# Patient Record
Sex: Female | Born: 1935
Health system: Southern US, Community
[De-identification: ages and names within clinical notes are randomized; demographics above are authoritative.]

## PROBLEM LIST (undated history)

## (undated) DIAGNOSIS — K222 Esophageal obstruction: Secondary | ICD-10-CM

## (undated) DIAGNOSIS — M542 Cervicalgia: Secondary | ICD-10-CM

## (undated) DIAGNOSIS — K219 Gastro-esophageal reflux disease without esophagitis: Secondary | ICD-10-CM

## (undated) DIAGNOSIS — T7840XA Allergy, unspecified, initial encounter: Secondary | ICD-10-CM

## (undated) DIAGNOSIS — E785 Hyperlipidemia, unspecified: Secondary | ICD-10-CM

## (undated) DIAGNOSIS — M25519 Pain in unspecified shoulder: Secondary | ICD-10-CM

## (undated) DIAGNOSIS — E079 Disorder of thyroid, unspecified: Secondary | ICD-10-CM

## (undated) DIAGNOSIS — K449 Diaphragmatic hernia without obstruction or gangrene: Secondary | ICD-10-CM

## (undated) DIAGNOSIS — D126 Benign neoplasm of colon, unspecified: Secondary | ICD-10-CM

## (undated) DIAGNOSIS — M1990 Unspecified osteoarthritis, unspecified site: Secondary | ICD-10-CM

## (undated) DIAGNOSIS — G8929 Other chronic pain: Secondary | ICD-10-CM

## (undated) DIAGNOSIS — H409 Unspecified glaucoma: Secondary | ICD-10-CM

## (undated) DIAGNOSIS — F419 Anxiety disorder, unspecified: Secondary | ICD-10-CM

## (undated) DIAGNOSIS — D62 Acute posthemorrhagic anemia: Secondary | ICD-10-CM

## (undated) DIAGNOSIS — M810 Age-related osteoporosis without current pathological fracture: Secondary | ICD-10-CM

## (undated) DIAGNOSIS — H269 Unspecified cataract: Secondary | ICD-10-CM

## (undated) DIAGNOSIS — Z9889 Other specified postprocedural states: Secondary | ICD-10-CM

## (undated) HISTORY — DX: Hyperlipidemia, unspecified: E78.5

## (undated) HISTORY — PX: TUBAL LIGATION: SHX77

## (undated) HISTORY — DX: Allergy, unspecified, initial encounter: T78.40XA

## (undated) HISTORY — DX: Benign neoplasm of colon, unspecified: D12.6

## (undated) HISTORY — DX: Unspecified cataract: H26.9

## (undated) HISTORY — DX: Disorder of thyroid, unspecified: E07.9

## (undated) HISTORY — PX: OTHER SURGICAL HISTORY: SHX169

## (undated) HISTORY — PX: FRACTURE SURGERY: SHX138

## (undated) HISTORY — DX: Diaphragmatic hernia without obstruction or gangrene: K44.9

## (undated) HISTORY — DX: Cervicalgia: M54.2

## (undated) HISTORY — DX: Other chronic pain: G89.29

## (undated) HISTORY — DX: Anxiety disorder, unspecified: F41.9

## (undated) HISTORY — DX: Other specified postprocedural states: Z98.890

## (undated) HISTORY — DX: Esophageal obstruction: K22.2

## (undated) HISTORY — DX: Unspecified osteoarthritis, unspecified site: M19.90

## (undated) HISTORY — DX: Unspecified glaucoma: H40.9

## (undated) HISTORY — DX: Pain in unspecified shoulder: M25.519

## (undated) HISTORY — DX: Age-related osteoporosis without current pathological fracture: M81.0

## (undated) HISTORY — DX: Gastro-esophageal reflux disease without esophagitis: K21.9

---
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
## 1968-10-22 DIAGNOSIS — M25519 Pain in unspecified shoulder: Secondary | ICD-10-CM

## 1968-10-22 HISTORY — DX: Pain in unspecified shoulder: M25.519

## 2001-04-29 ENCOUNTER — Emergency Department (HOSPITAL_COMMUNITY): Admission: EM | Admit: 2001-04-29 | Discharge: 2001-04-29 | Payer: Self-pay | Admitting: Emergency Medicine

## 2001-06-05 ENCOUNTER — Emergency Department (HOSPITAL_COMMUNITY): Admission: EM | Admit: 2001-06-05 | Discharge: 2001-06-05 | Payer: Self-pay | Admitting: Emergency Medicine

## 2001-07-09 ENCOUNTER — Ambulatory Visit (HOSPITAL_COMMUNITY): Admission: RE | Admit: 2001-07-09 | Discharge: 2001-07-09 | Payer: Self-pay | Admitting: General Surgery

## 2001-08-18 ENCOUNTER — Encounter: Payer: Self-pay | Admitting: Family Medicine

## 2001-08-18 ENCOUNTER — Ambulatory Visit (HOSPITAL_COMMUNITY): Admission: RE | Admit: 2001-08-18 | Discharge: 2001-08-18 | Payer: Self-pay | Admitting: Family Medicine

## 2001-08-20 ENCOUNTER — Other Ambulatory Visit: Admission: RE | Admit: 2001-08-20 | Discharge: 2001-08-20 | Payer: Self-pay | Admitting: Family Medicine

## 2001-08-22 ENCOUNTER — Ambulatory Visit (HOSPITAL_COMMUNITY): Admission: RE | Admit: 2001-08-22 | Discharge: 2001-08-22 | Payer: Self-pay | Admitting: Family Medicine

## 2001-08-22 ENCOUNTER — Encounter: Payer: Self-pay | Admitting: Family Medicine

## 2002-05-15 ENCOUNTER — Emergency Department (HOSPITAL_COMMUNITY): Admission: EM | Admit: 2002-05-15 | Discharge: 2002-05-15 | Payer: Self-pay | Admitting: Emergency Medicine

## 2002-05-15 ENCOUNTER — Encounter: Payer: Self-pay | Admitting: Emergency Medicine

## 2002-08-11 ENCOUNTER — Encounter: Payer: Self-pay | Admitting: Family Medicine

## 2002-08-11 ENCOUNTER — Ambulatory Visit (HOSPITAL_COMMUNITY): Admission: RE | Admit: 2002-08-11 | Discharge: 2002-08-11 | Payer: Self-pay | Admitting: Family Medicine

## 2002-08-21 ENCOUNTER — Ambulatory Visit (HOSPITAL_COMMUNITY): Admission: RE | Admit: 2002-08-21 | Discharge: 2002-08-21 | Payer: Self-pay | Admitting: Family Medicine

## 2002-08-21 ENCOUNTER — Encounter: Payer: Self-pay | Admitting: Family Medicine

## 2002-09-15 ENCOUNTER — Ambulatory Visit (HOSPITAL_COMMUNITY): Admission: RE | Admit: 2002-09-15 | Discharge: 2002-09-15 | Payer: Self-pay | Admitting: Ophthalmology

## 2002-10-22 HISTORY — PX: CATARACT EXTRACTION, BILATERAL: SHX1313

## 2002-12-15 ENCOUNTER — Ambulatory Visit (HOSPITAL_COMMUNITY): Admission: RE | Admit: 2002-12-15 | Discharge: 2002-12-15 | Payer: Self-pay | Admitting: Ophthalmology

## 2003-03-15 ENCOUNTER — Encounter: Payer: Self-pay | Admitting: Family Medicine

## 2003-03-15 ENCOUNTER — Ambulatory Visit (HOSPITAL_COMMUNITY): Admission: RE | Admit: 2003-03-15 | Discharge: 2003-03-15 | Payer: Self-pay | Admitting: Family Medicine

## 2003-06-07 ENCOUNTER — Other Ambulatory Visit: Admission: RE | Admit: 2003-06-07 | Discharge: 2003-06-07 | Payer: Self-pay | Admitting: Dermatology

## 2003-07-05 ENCOUNTER — Encounter: Payer: Self-pay | Admitting: Family Medicine

## 2003-07-05 ENCOUNTER — Ambulatory Visit (HOSPITAL_COMMUNITY): Admission: RE | Admit: 2003-07-05 | Discharge: 2003-07-05 | Payer: Self-pay | Admitting: Family Medicine

## 2003-08-16 ENCOUNTER — Ambulatory Visit (HOSPITAL_COMMUNITY): Admission: RE | Admit: 2003-08-16 | Discharge: 2003-08-16 | Payer: Self-pay | Admitting: Family Medicine

## 2003-08-16 ENCOUNTER — Encounter: Payer: Self-pay | Admitting: Family Medicine

## 2003-09-02 ENCOUNTER — Ambulatory Visit (HOSPITAL_COMMUNITY): Admission: RE | Admit: 2003-09-02 | Discharge: 2003-09-02 | Payer: Self-pay | Admitting: Family Medicine

## 2004-08-30 ENCOUNTER — Ambulatory Visit: Payer: Self-pay | Admitting: Family Medicine

## 2004-11-22 ENCOUNTER — Ambulatory Visit: Payer: Self-pay | Admitting: Family Medicine

## 2004-11-24 ENCOUNTER — Ambulatory Visit (HOSPITAL_COMMUNITY): Admission: RE | Admit: 2004-11-24 | Discharge: 2004-11-24 | Payer: Self-pay | Admitting: Family Medicine

## 2005-02-20 ENCOUNTER — Ambulatory Visit: Payer: Self-pay | Admitting: Family Medicine

## 2005-02-26 ENCOUNTER — Ambulatory Visit: Payer: Self-pay | Admitting: Family Medicine

## 2005-02-26 ENCOUNTER — Ambulatory Visit: Payer: Self-pay | Admitting: Orthopedic Surgery

## 2005-03-01 ENCOUNTER — Ambulatory Visit (HOSPITAL_COMMUNITY): Admission: RE | Admit: 2005-03-01 | Discharge: 2005-03-01 | Payer: Self-pay | Admitting: Family Medicine

## 2005-03-09 ENCOUNTER — Ambulatory Visit (HOSPITAL_COMMUNITY): Admission: RE | Admit: 2005-03-09 | Discharge: 2005-03-09 | Payer: Self-pay | Admitting: Family Medicine

## 2005-03-12 ENCOUNTER — Ambulatory Visit: Payer: Self-pay | Admitting: Orthopedic Surgery

## 2005-04-02 ENCOUNTER — Ambulatory Visit: Payer: Self-pay | Admitting: Orthopedic Surgery

## 2005-04-09 ENCOUNTER — Ambulatory Visit: Payer: Self-pay | Admitting: Family Medicine

## 2005-04-13 ENCOUNTER — Ambulatory Visit (HOSPITAL_COMMUNITY): Admission: RE | Admit: 2005-04-13 | Discharge: 2005-04-13 | Payer: Self-pay | Admitting: Family Medicine

## 2005-04-19 ENCOUNTER — Ambulatory Visit (HOSPITAL_COMMUNITY): Admission: RE | Admit: 2005-04-19 | Discharge: 2005-04-19 | Payer: Self-pay | Admitting: Family Medicine

## 2005-04-30 ENCOUNTER — Ambulatory Visit: Payer: Self-pay | Admitting: Family Medicine

## 2005-05-10 ENCOUNTER — Emergency Department (HOSPITAL_COMMUNITY): Admission: EM | Admit: 2005-05-10 | Discharge: 2005-05-10 | Payer: Self-pay | Admitting: Emergency Medicine

## 2005-05-14 ENCOUNTER — Ambulatory Visit (HOSPITAL_COMMUNITY): Admission: RE | Admit: 2005-05-14 | Discharge: 2005-05-14 | Payer: Self-pay | Admitting: Family Medicine

## 2005-05-18 ENCOUNTER — Emergency Department (HOSPITAL_COMMUNITY): Admission: EM | Admit: 2005-05-18 | Discharge: 2005-05-18 | Payer: Self-pay | Admitting: Emergency Medicine

## 2005-05-24 ENCOUNTER — Ambulatory Visit: Payer: Self-pay | Admitting: Family Medicine

## 2005-05-28 ENCOUNTER — Ambulatory Visit: Payer: Self-pay | Admitting: Family Medicine

## 2005-06-01 ENCOUNTER — Ambulatory Visit: Payer: Self-pay | Admitting: Family Medicine

## 2005-06-19 ENCOUNTER — Emergency Department (HOSPITAL_COMMUNITY): Admission: EM | Admit: 2005-06-19 | Discharge: 2005-06-19 | Payer: Self-pay | Admitting: Emergency Medicine

## 2005-06-20 ENCOUNTER — Ambulatory Visit: Payer: Self-pay | Admitting: Family Medicine

## 2005-06-27 ENCOUNTER — Ambulatory Visit: Payer: Self-pay | Admitting: Family Medicine

## 2005-07-09 ENCOUNTER — Ambulatory Visit (HOSPITAL_COMMUNITY): Admission: RE | Admit: 2005-07-09 | Discharge: 2005-07-09 | Payer: Self-pay | Admitting: Family Medicine

## 2005-07-30 ENCOUNTER — Ambulatory Visit: Payer: Self-pay | Admitting: Family Medicine

## 2005-08-06 ENCOUNTER — Ambulatory Visit: Payer: Self-pay | Admitting: Internal Medicine

## 2005-08-09 ENCOUNTER — Ambulatory Visit: Payer: Self-pay | Admitting: Family Medicine

## 2005-09-24 ENCOUNTER — Ambulatory Visit: Payer: Self-pay | Admitting: Family Medicine

## 2005-11-23 ENCOUNTER — Ambulatory Visit: Payer: Self-pay | Admitting: Family Medicine

## 2005-11-26 ENCOUNTER — Ambulatory Visit: Payer: Self-pay | Admitting: Family Medicine

## 2005-11-26 ENCOUNTER — Ambulatory Visit (HOSPITAL_COMMUNITY): Admission: RE | Admit: 2005-11-26 | Discharge: 2005-11-26 | Payer: Self-pay | Admitting: Family Medicine

## 2005-12-24 ENCOUNTER — Ambulatory Visit: Payer: Self-pay | Admitting: Family Medicine

## 2006-01-07 ENCOUNTER — Ambulatory Visit: Payer: Self-pay | Admitting: Orthopedic Surgery

## 2006-02-21 ENCOUNTER — Ambulatory Visit: Payer: Self-pay | Admitting: Orthopedic Surgery

## 2006-02-22 ENCOUNTER — Ambulatory Visit: Payer: Self-pay | Admitting: Internal Medicine

## 2006-02-22 ENCOUNTER — Ambulatory Visit: Payer: Self-pay | Admitting: Family Medicine

## 2006-02-25 ENCOUNTER — Ambulatory Visit: Payer: Self-pay | Admitting: Internal Medicine

## 2006-03-01 ENCOUNTER — Ambulatory Visit: Payer: Self-pay | Admitting: Internal Medicine

## 2006-03-01 ENCOUNTER — Ambulatory Visit (HOSPITAL_COMMUNITY): Admission: RE | Admit: 2006-03-01 | Discharge: 2006-03-01 | Payer: Self-pay | Admitting: Internal Medicine

## 2006-03-01 DIAGNOSIS — Z9889 Other specified postprocedural states: Secondary | ICD-10-CM

## 2006-03-01 DIAGNOSIS — K219 Gastro-esophageal reflux disease without esophagitis: Secondary | ICD-10-CM

## 2006-03-01 HISTORY — PX: ESOPHAGOGASTRODUODENOSCOPY: SHX1529

## 2006-03-01 HISTORY — DX: Other specified postprocedural states: Z98.890

## 2006-03-01 HISTORY — PX: COLONOSCOPY: SHX174

## 2006-03-01 HISTORY — DX: Gastro-esophageal reflux disease without esophagitis: K21.9

## 2006-04-01 ENCOUNTER — Ambulatory Visit: Payer: Self-pay | Admitting: Internal Medicine

## 2006-04-09 ENCOUNTER — Ambulatory Visit: Payer: Self-pay | Admitting: Internal Medicine

## 2006-04-10 ENCOUNTER — Ambulatory Visit: Payer: Self-pay | Admitting: Orthopedic Surgery

## 2006-04-19 ENCOUNTER — Ambulatory Visit: Payer: Self-pay | Admitting: Family Medicine

## 2006-05-17 ENCOUNTER — Ambulatory Visit (HOSPITAL_COMMUNITY): Admission: RE | Admit: 2006-05-17 | Discharge: 2006-05-17 | Payer: Self-pay | Admitting: Family Medicine

## 2006-06-25 ENCOUNTER — Ambulatory Visit: Payer: Self-pay | Admitting: Internal Medicine

## 2006-07-03 ENCOUNTER — Ambulatory Visit (HOSPITAL_COMMUNITY): Admission: RE | Admit: 2006-07-03 | Discharge: 2006-07-03 | Payer: Self-pay | Admitting: Internal Medicine

## 2006-07-11 ENCOUNTER — Ambulatory Visit: Payer: Self-pay | Admitting: Orthopedic Surgery

## 2006-07-19 ENCOUNTER — Ambulatory Visit: Payer: Self-pay | Admitting: Family Medicine

## 2006-07-29 ENCOUNTER — Ambulatory Visit: Payer: Self-pay | Admitting: Internal Medicine

## 2006-08-15 ENCOUNTER — Ambulatory Visit: Payer: Self-pay | Admitting: Orthopedic Surgery

## 2006-09-25 ENCOUNTER — Ambulatory Visit: Payer: Self-pay | Admitting: Family Medicine

## 2006-09-26 ENCOUNTER — Ambulatory Visit: Payer: Self-pay | Admitting: Orthopedic Surgery

## 2006-10-07 ENCOUNTER — Ambulatory Visit: Payer: Self-pay | Admitting: Internal Medicine

## 2006-10-22 HISTORY — PX: NASAL SINUS SURGERY: SHX719

## 2006-10-24 ENCOUNTER — Ambulatory Visit: Payer: Self-pay | Admitting: Family Medicine

## 2006-10-25 ENCOUNTER — Encounter: Payer: Self-pay | Admitting: Family Medicine

## 2006-12-25 ENCOUNTER — Other Ambulatory Visit: Admission: RE | Admit: 2006-12-25 | Discharge: 2006-12-25 | Payer: Self-pay | Admitting: Family Medicine

## 2006-12-25 ENCOUNTER — Encounter: Payer: Self-pay | Admitting: Family Medicine

## 2006-12-25 ENCOUNTER — Ambulatory Visit: Payer: Self-pay | Admitting: Family Medicine

## 2006-12-25 LAB — CONVERTED CEMR LAB
BUN: 11 mg/dL (ref 6–23)
Cholesterol: 186 mg/dL (ref 0–200)
Eosinophils Absolute: 0.1 10*3/uL (ref 0.0–0.7)
Glucose, Bld: 92 mg/dL (ref 70–99)
HCT: 37.7 % (ref 36.0–46.0)
Hemoglobin: 11.6 g/dL — ABNORMAL LOW (ref 12.0–15.0)
LDL Cholesterol: 105 mg/dL — ABNORMAL HIGH (ref 0–99)
Lymphs Abs: 2.2 10*3/uL (ref 0.7–3.3)
MCV: 95.7 fL (ref 78.0–100.0)
Monocytes Absolute: 0.5 10*3/uL (ref 0.2–0.7)
Monocytes Relative: 9 % (ref 3–11)
Neutrophils Relative %: 48 % (ref 43–77)
Potassium: 4.9 meq/L (ref 3.5–5.3)
RBC: 3.94 M/uL (ref 3.87–5.11)
VLDL: 28 mg/dL (ref 0–40)
WBC: 5.4 10*3/uL (ref 4.0–10.5)

## 2006-12-26 ENCOUNTER — Encounter: Payer: Self-pay | Admitting: Family Medicine

## 2006-12-26 LAB — CONVERTED CEMR LAB
AST: 22 units/L (ref 0–37)
Bilirubin, Direct: 0.1 mg/dL (ref 0.0–0.3)
Total Bilirubin: 0.4 mg/dL (ref 0.3–1.2)

## 2006-12-30 ENCOUNTER — Ambulatory Visit (HOSPITAL_COMMUNITY): Admission: RE | Admit: 2006-12-30 | Discharge: 2006-12-30 | Payer: Self-pay | Admitting: Family Medicine

## 2007-01-20 ENCOUNTER — Ambulatory Visit (HOSPITAL_COMMUNITY): Admission: RE | Admit: 2007-01-20 | Discharge: 2007-01-20 | Payer: Self-pay | Admitting: Family Medicine

## 2007-01-20 ENCOUNTER — Ambulatory Visit: Payer: Self-pay | Admitting: Family Medicine

## 2007-02-03 ENCOUNTER — Ambulatory Visit: Payer: Self-pay | Admitting: Orthopedic Surgery

## 2007-02-12 ENCOUNTER — Emergency Department (HOSPITAL_COMMUNITY): Admission: EM | Admit: 2007-02-12 | Discharge: 2007-02-12 | Payer: Self-pay | Admitting: Emergency Medicine

## 2007-02-19 ENCOUNTER — Ambulatory Visit: Payer: Self-pay | Admitting: Family Medicine

## 2007-02-24 ENCOUNTER — Encounter: Payer: Self-pay | Admitting: Family Medicine

## 2007-02-28 ENCOUNTER — Ambulatory Visit (HOSPITAL_COMMUNITY): Admission: RE | Admit: 2007-02-28 | Discharge: 2007-02-28 | Payer: Self-pay | Admitting: Family Medicine

## 2007-03-28 ENCOUNTER — Ambulatory Visit: Payer: Self-pay | Admitting: Family Medicine

## 2007-03-28 LAB — CONVERTED CEMR LAB
ALT: 12 units/L (ref 0–35)
Albumin: 4.5 g/dL (ref 3.5–5.2)
Cholesterol: 196 mg/dL (ref 0–200)
Total CHOL/HDL Ratio: 3.4
Total Protein: 7.9 g/dL (ref 6.0–8.3)
Triglycerides: 165 mg/dL — ABNORMAL HIGH (ref <150)
VLDL: 33 mg/dL (ref 0–40)

## 2007-04-26 ENCOUNTER — Emergency Department (HOSPITAL_COMMUNITY): Admission: EM | Admit: 2007-04-26 | Discharge: 2007-04-26 | Payer: Self-pay | Admitting: Surgery

## 2007-05-01 ENCOUNTER — Ambulatory Visit: Payer: Self-pay | Admitting: Internal Medicine

## 2007-05-22 ENCOUNTER — Ambulatory Visit (HOSPITAL_COMMUNITY): Admission: RE | Admit: 2007-05-22 | Discharge: 2007-05-22 | Payer: Self-pay | Admitting: Family Medicine

## 2007-06-04 ENCOUNTER — Ambulatory Visit (HOSPITAL_COMMUNITY): Admission: RE | Admit: 2007-06-04 | Discharge: 2007-06-04 | Payer: Self-pay | Admitting: Family Medicine

## 2007-06-12 ENCOUNTER — Ambulatory Visit: Payer: Self-pay | Admitting: Family Medicine

## 2007-06-17 ENCOUNTER — Ambulatory Visit (HOSPITAL_COMMUNITY): Admission: RE | Admit: 2007-06-17 | Discharge: 2007-06-17 | Payer: Self-pay | Admitting: Ophthalmology

## 2007-07-08 ENCOUNTER — Ambulatory Visit: Payer: Self-pay | Admitting: Family Medicine

## 2007-07-10 ENCOUNTER — Ambulatory Visit (HOSPITAL_COMMUNITY): Admission: RE | Admit: 2007-07-10 | Discharge: 2007-07-10 | Payer: Self-pay | Admitting: Family Medicine

## 2007-07-24 ENCOUNTER — Ambulatory Visit: Payer: Self-pay | Admitting: Family Medicine

## 2007-08-22 ENCOUNTER — Encounter: Payer: Self-pay | Admitting: Family Medicine

## 2007-08-22 LAB — CONVERTED CEMR LAB
ALT: 13 units/L (ref 0–35)
Alkaline Phosphatase: 58 units/L (ref 39–117)
Cholesterol: 158 mg/dL (ref 0–200)
Indirect Bilirubin: 0.2 mg/dL (ref 0.0–0.9)
Total Protein: 7.2 g/dL (ref 6.0–8.3)
Triglycerides: 178 mg/dL — ABNORMAL HIGH (ref <150)

## 2007-09-08 ENCOUNTER — Ambulatory Visit: Payer: Self-pay | Admitting: Family Medicine

## 2007-09-29 ENCOUNTER — Ambulatory Visit (HOSPITAL_COMMUNITY): Admission: RE | Admit: 2007-09-29 | Discharge: 2007-09-29 | Payer: Self-pay | Admitting: Family Medicine

## 2007-10-07 ENCOUNTER — Encounter: Payer: Self-pay | Admitting: Orthopedic Surgery

## 2007-10-09 ENCOUNTER — Encounter: Payer: Self-pay | Admitting: Family Medicine

## 2008-01-06 ENCOUNTER — Ambulatory Visit: Payer: Self-pay | Admitting: Family Medicine

## 2008-01-15 DIAGNOSIS — E785 Hyperlipidemia, unspecified: Secondary | ICD-10-CM

## 2008-01-15 DIAGNOSIS — M8000XA Age-related osteoporosis with current pathological fracture, unspecified site, initial encounter for fracture: Secondary | ICD-10-CM

## 2008-01-22 ENCOUNTER — Encounter: Payer: Self-pay | Admitting: Family Medicine

## 2008-01-22 LAB — CONVERTED CEMR LAB
ALT: 10 units/L (ref 0–35)
AST: 17 units/L (ref 0–37)
Alkaline Phosphatase: 62 units/L (ref 39–117)
Basophils Absolute: 0.1 10*3/uL (ref 0.0–0.1)
Bilirubin, Direct: 0.1 mg/dL (ref 0.0–0.3)
Calcium: 9.5 mg/dL (ref 8.4–10.5)
Cholesterol: 182 mg/dL (ref 0–200)
Creatinine, Ser: 0.64 mg/dL (ref 0.40–1.20)
Eosinophils Absolute: 0.2 10*3/uL (ref 0.0–0.7)
Eosinophils Relative: 3 % (ref 0–5)
Glucose, Bld: 102 mg/dL — ABNORMAL HIGH (ref 70–99)
HCT: 35.1 % — ABNORMAL LOW (ref 36.0–46.0)
Indirect Bilirubin: 0.3 mg/dL (ref 0.0–0.9)
Lymphs Abs: 2.5 10*3/uL (ref 0.7–4.0)
MCV: 93.9 fL (ref 78.0–100.0)
Monocytes Absolute: 0.6 10*3/uL (ref 0.1–1.0)
Platelets: 357 10*3/uL (ref 150–400)
RDW: 13.9 % (ref 11.5–15.5)
Sodium: 144 meq/L (ref 135–145)
Total CHOL/HDL Ratio: 3.5

## 2008-01-23 ENCOUNTER — Encounter: Payer: Self-pay | Admitting: Family Medicine

## 2008-01-23 LAB — CONVERTED CEMR LAB: Retic Ct Pct: 1.1 % (ref 0.4–3.1)

## 2008-02-23 ENCOUNTER — Encounter: Payer: Self-pay | Admitting: Family Medicine

## 2008-06-07 ENCOUNTER — Ambulatory Visit: Payer: Self-pay | Admitting: Family Medicine

## 2008-06-14 ENCOUNTER — Encounter: Payer: Self-pay | Admitting: Family Medicine

## 2008-06-15 LAB — CONVERTED CEMR LAB
Albumin: 4.3 g/dL (ref 3.5–5.2)
HDL: 49 mg/dL (ref >39)
LDL Cholesterol: 78 mg/dL (ref 0–99)
Total Bilirubin: 0.4 mg/dL (ref 0.3–1.2)
Total CHOL/HDL Ratio: 3.4
Total Protein: 7.5 g/dL (ref 6.0–8.3)
VLDL: 41 mg/dL — ABNORMAL HIGH (ref 0–40)

## 2008-07-02 ENCOUNTER — Telehealth: Payer: Self-pay | Admitting: Family Medicine

## 2008-07-07 ENCOUNTER — Ambulatory Visit: Payer: Self-pay | Admitting: Family Medicine

## 2008-07-07 LAB — CONVERTED CEMR LAB: Glucose, Bld: 91 mg/dL

## 2008-07-19 ENCOUNTER — Encounter: Payer: Self-pay | Admitting: Family Medicine

## 2008-07-20 ENCOUNTER — Ambulatory Visit: Payer: Self-pay | Admitting: Family Medicine

## 2008-07-25 DIAGNOSIS — M26609 Unspecified temporomandibular joint disorder, unspecified side: Secondary | ICD-10-CM | POA: Insufficient documentation

## 2008-08-05 ENCOUNTER — Telehealth: Payer: Self-pay | Admitting: Family Medicine

## 2008-08-06 ENCOUNTER — Encounter: Payer: Self-pay | Admitting: Family Medicine

## 2008-08-10 ENCOUNTER — Telehealth (INDEPENDENT_AMBULATORY_CARE_PROVIDER_SITE_OTHER): Payer: Self-pay | Admitting: Internal Medicine

## 2008-08-11 ENCOUNTER — Telehealth (INDEPENDENT_AMBULATORY_CARE_PROVIDER_SITE_OTHER): Payer: Self-pay | Admitting: *Deleted

## 2008-08-11 ENCOUNTER — Ambulatory Visit: Payer: Self-pay | Admitting: Family Medicine

## 2008-08-11 DIAGNOSIS — N3000 Acute cystitis without hematuria: Secondary | ICD-10-CM

## 2008-08-11 DIAGNOSIS — R509 Fever, unspecified: Secondary | ICD-10-CM

## 2008-08-11 DIAGNOSIS — N814 Uterovaginal prolapse, unspecified: Secondary | ICD-10-CM | POA: Insufficient documentation

## 2008-08-11 LAB — CONVERTED CEMR LAB
Bilirubin Urine: NEGATIVE
Glucose, Urine, Semiquant: NEGATIVE
Ketones, urine, test strip: NEGATIVE
Specific Gravity, Urine: 1.02
pH: 5.5

## 2008-08-13 LAB — CONVERTED CEMR LAB
Basophils Relative: 1 % (ref 0–1)
Eosinophils Absolute: 0.1 10*3/uL (ref 0.0–0.7)
Lymphs Abs: 2.3 10*3/uL (ref 0.7–4.0)
Monocytes Relative: 9 % (ref 3–12)
Neutro Abs: 4.3 10*3/uL (ref 1.7–7.7)
Neutrophils Relative %: 58 % (ref 43–77)
Platelets: 376 10*3/uL (ref 150–400)
RBC: 4.02 M/uL (ref 3.87–5.11)
WBC: 7.3 10*3/uL (ref 4.0–10.5)

## 2008-08-17 ENCOUNTER — Encounter: Admission: RE | Admit: 2008-08-17 | Discharge: 2008-08-17 | Payer: Self-pay | Admitting: Family Medicine

## 2008-09-07 ENCOUNTER — Ambulatory Visit: Payer: Self-pay | Admitting: Family Medicine

## 2008-09-07 DIAGNOSIS — J0100 Acute maxillary sinusitis, unspecified: Secondary | ICD-10-CM | POA: Insufficient documentation

## 2008-09-07 DIAGNOSIS — R519 Headache, unspecified: Secondary | ICD-10-CM | POA: Insufficient documentation

## 2008-09-07 DIAGNOSIS — R51 Headache: Secondary | ICD-10-CM | POA: Insufficient documentation

## 2008-09-08 LAB — CONVERTED CEMR LAB
ALT: 12 units/L (ref 0–35)
Albumin: 4.4 g/dL (ref 3.5–5.2)
BUN: 7 mg/dL (ref 6–23)
Chloride: 104 meq/L (ref 96–112)
Cholesterol: 179 mg/dL (ref 0–200)
Glucose, Bld: 97 mg/dL (ref 70–99)
Indirect Bilirubin: 0.3 mg/dL (ref 0.0–0.9)
LDL Cholesterol: 92 mg/dL (ref 0–99)
Potassium: 4.5 meq/L (ref 3.5–5.3)
Sodium: 142 meq/L (ref 135–145)
Total CHOL/HDL Ratio: 3.3
Total Protein: 7.2 g/dL (ref 6.0–8.3)
Triglycerides: 165 mg/dL — ABNORMAL HIGH (ref <150)
VLDL: 33 mg/dL (ref 0–40)

## 2008-09-09 ENCOUNTER — Telehealth: Payer: Self-pay | Admitting: Family Medicine

## 2008-09-14 DIAGNOSIS — J209 Acute bronchitis, unspecified: Secondary | ICD-10-CM

## 2008-10-18 ENCOUNTER — Encounter: Payer: Self-pay | Admitting: Family Medicine

## 2008-11-10 ENCOUNTER — Ambulatory Visit: Payer: Self-pay | Admitting: Family Medicine

## 2008-12-16 ENCOUNTER — Encounter: Payer: Self-pay | Admitting: Family Medicine

## 2008-12-16 LAB — CONVERTED CEMR LAB
ALT: 12 U/L
AST: 20 U/L
Albumin: 4.4 g/dL
Alkaline Phosphatase: 59 U/L
BUN: 9 mg/dL
Bilirubin, Direct: 0.1 mg/dL
CO2: 25 meq/L
Calcium: 9.6 mg/dL
Chloride: 105 meq/L
Cholesterol: 184 mg/dL
Creatinine, Ser: 0.62 mg/dL
Glucose, Bld: 107 mg/dL — ABNORMAL HIGH
HDL: 58 mg/dL
Indirect Bilirubin: 0.3 mg/dL
LDL Cholesterol: 100 mg/dL — ABNORMAL HIGH
Potassium: 4.3 meq/L
Sodium: 143 meq/L
Total Bilirubin: 0.4 mg/dL
Total CHOL/HDL Ratio: 3.2
Total Protein: 7.4 g/dL
Triglycerides: 132 mg/dL
VLDL: 26 mg/dL
Vit D, 25-Hydroxy: 37 ng/mL

## 2008-12-24 ENCOUNTER — Telehealth: Payer: Self-pay | Admitting: Family Medicine

## 2009-01-04 ENCOUNTER — Telehealth: Payer: Self-pay | Admitting: Family Medicine

## 2009-01-20 ENCOUNTER — Encounter: Payer: Self-pay | Admitting: Family Medicine

## 2009-01-24 ENCOUNTER — Telehealth: Payer: Self-pay | Admitting: Family Medicine

## 2009-03-02 ENCOUNTER — Encounter: Payer: Self-pay | Admitting: Family Medicine

## 2009-03-02 ENCOUNTER — Ambulatory Visit: Payer: Self-pay | Admitting: Family Medicine

## 2009-03-02 ENCOUNTER — Other Ambulatory Visit: Admission: RE | Admit: 2009-03-02 | Discharge: 2009-03-02 | Payer: Self-pay | Admitting: Family Medicine

## 2009-03-02 DIAGNOSIS — R5381 Other malaise: Secondary | ICD-10-CM

## 2009-03-02 DIAGNOSIS — R5383 Other fatigue: Secondary | ICD-10-CM

## 2009-03-02 DIAGNOSIS — M542 Cervicalgia: Secondary | ICD-10-CM

## 2009-03-02 HISTORY — DX: Cervicalgia: M54.2

## 2009-03-02 LAB — CONVERTED CEMR LAB: OCCULT 1: NEGATIVE

## 2009-05-23 ENCOUNTER — Encounter: Payer: Self-pay | Admitting: Family Medicine

## 2009-06-02 ENCOUNTER — Ambulatory Visit: Payer: Self-pay | Admitting: Family Medicine

## 2009-07-12 LAB — CONVERTED CEMR LAB
AST: 21 units/L (ref 0–37)
Bilirubin, Direct: 0.1 mg/dL (ref 0.0–0.3)
Cholesterol: 168 mg/dL (ref 0–200)
Indirect Bilirubin: 0.3 mg/dL (ref 0.0–0.9)
Total Bilirubin: 0.4 mg/dL (ref 0.3–1.2)
Total CHOL/HDL Ratio: 3.7

## 2009-08-09 ENCOUNTER — Ambulatory Visit: Payer: Self-pay | Admitting: Family Medicine

## 2009-08-18 ENCOUNTER — Encounter: Payer: Self-pay | Admitting: Family Medicine

## 2009-08-22 ENCOUNTER — Telehealth: Payer: Self-pay | Admitting: Family Medicine

## 2009-08-22 ENCOUNTER — Ambulatory Visit: Payer: Self-pay | Admitting: Family Medicine

## 2009-08-22 ENCOUNTER — Ambulatory Visit (HOSPITAL_COMMUNITY): Admission: RE | Admit: 2009-08-22 | Discharge: 2009-08-22 | Payer: Self-pay | Admitting: Family Medicine

## 2009-08-22 LAB — CONVERTED CEMR LAB
Basophils Relative: 1 % (ref 0–1)
Eosinophils Absolute: 0.1 10*3/uL (ref 0.0–0.7)
Eosinophils Relative: 1 % (ref 0–5)
HCT: 38.1 % (ref 36.0–46.0)
Lymphs Abs: 1.9 10*3/uL (ref 0.7–4.0)
MCHC: 31.2 g/dL (ref 30.0–36.0)
MCV: 95.7 fL (ref 78.0–100.0)
Monocytes Relative: 13 % — ABNORMAL HIGH (ref 3–12)
Platelets: 331 10*3/uL (ref 150–400)
WBC: 5.7 10*3/uL (ref 4.0–10.5)

## 2009-08-28 DIAGNOSIS — J069 Acute upper respiratory infection, unspecified: Secondary | ICD-10-CM | POA: Insufficient documentation

## 2009-08-30 ENCOUNTER — Telehealth: Payer: Self-pay | Admitting: Family Medicine

## 2009-10-10 ENCOUNTER — Telehealth: Payer: Self-pay | Admitting: Family Medicine

## 2009-11-24 ENCOUNTER — Ambulatory Visit: Payer: Self-pay | Admitting: Family Medicine

## 2009-11-25 LAB — CONVERTED CEMR LAB
Albumin: 4.1 g/dL (ref 3.5–5.2)
HDL: 63 mg/dL (ref >39)
LDL Cholesterol: 116 mg/dL — ABNORMAL HIGH (ref 0–99)
TSH: 2.393 microintl units/mL (ref 0.350–4.500)
Total Bilirubin: 0.4 mg/dL (ref 0.3–1.2)
Total CHOL/HDL Ratio: 3.3

## 2010-01-11 ENCOUNTER — Ambulatory Visit: Payer: Self-pay | Admitting: Family Medicine

## 2010-01-11 DIAGNOSIS — R079 Chest pain, unspecified: Secondary | ICD-10-CM | POA: Insufficient documentation

## 2010-01-19 ENCOUNTER — Emergency Department (HOSPITAL_COMMUNITY): Admission: EM | Admit: 2010-01-19 | Discharge: 2010-01-20 | Payer: Self-pay | Admitting: Emergency Medicine

## 2010-01-25 ENCOUNTER — Ambulatory Visit: Payer: Self-pay | Admitting: Family Medicine

## 2010-01-25 DIAGNOSIS — J302 Other seasonal allergic rhinitis: Secondary | ICD-10-CM | POA: Insufficient documentation

## 2010-01-25 DIAGNOSIS — B9789 Other viral agents as the cause of diseases classified elsewhere: Secondary | ICD-10-CM | POA: Insufficient documentation

## 2010-02-11 ENCOUNTER — Emergency Department (HOSPITAL_COMMUNITY): Admission: EM | Admit: 2010-02-11 | Discharge: 2010-02-11 | Payer: Self-pay | Admitting: Emergency Medicine

## 2010-02-14 ENCOUNTER — Ambulatory Visit: Payer: Self-pay | Admitting: Family Medicine

## 2010-02-14 DIAGNOSIS — R0989 Other specified symptoms and signs involving the circulatory and respiratory systems: Secondary | ICD-10-CM

## 2010-02-23 ENCOUNTER — Ambulatory Visit (HOSPITAL_COMMUNITY): Admission: RE | Admit: 2010-02-23 | Discharge: 2010-02-23 | Payer: Self-pay | Admitting: Family Medicine

## 2010-03-30 LAB — CONVERTED CEMR LAB
AST: 20 units/L (ref 0–37)
Albumin: 4.5 g/dL (ref 3.5–5.2)
Basophils Relative: 1 % (ref 0–1)
CO2: 26 meq/L (ref 19–32)
Calcium: 9.8 mg/dL (ref 8.4–10.5)
Chloride: 104 meq/L (ref 96–112)
HDL: 61 mg/dL (ref >39)
Lymphocytes Relative: 43 % (ref 12–46)
Lymphs Abs: 2.2 10*3/uL (ref 0.7–4.0)
Monocytes Relative: 9 % (ref 3–12)
Neutro Abs: 2.3 10*3/uL (ref 1.7–7.7)
Neutrophils Relative %: 45 % (ref 43–77)
RBC: 3.81 M/uL — ABNORMAL LOW (ref 3.87–5.11)
Sodium: 141 meq/L (ref 135–145)
TSH: 2.667 microintl units/mL (ref 0.350–4.500)
Total Bilirubin: 0.5 mg/dL (ref 0.3–1.2)
Total CHOL/HDL Ratio: 2.9
WBC: 5.2 10*3/uL (ref 4.0–10.5)

## 2010-04-02 ENCOUNTER — Emergency Department (HOSPITAL_COMMUNITY): Admission: EM | Admit: 2010-04-02 | Discharge: 2010-04-02 | Payer: Self-pay | Admitting: Emergency Medicine

## 2010-04-04 ENCOUNTER — Ambulatory Visit: Payer: Self-pay | Admitting: Family Medicine

## 2010-04-04 DIAGNOSIS — L509 Urticaria, unspecified: Secondary | ICD-10-CM

## 2010-04-12 ENCOUNTER — Encounter: Payer: Self-pay | Admitting: Family Medicine

## 2010-04-25 ENCOUNTER — Telehealth (INDEPENDENT_AMBULATORY_CARE_PROVIDER_SITE_OTHER): Payer: Self-pay | Admitting: *Deleted

## 2010-04-25 ENCOUNTER — Ambulatory Visit: Payer: Self-pay | Admitting: Family Medicine

## 2010-04-25 DIAGNOSIS — L299 Pruritus, unspecified: Secondary | ICD-10-CM | POA: Insufficient documentation

## 2010-04-25 DIAGNOSIS — M79609 Pain in unspecified limb: Secondary | ICD-10-CM | POA: Insufficient documentation

## 2010-04-25 LAB — CONVERTED CEMR LAB
Glucose, Urine, Semiquant: NEGATIVE
Nitrite: NEGATIVE
Urobilinogen, UA: 0.2
pH: 5

## 2010-04-27 ENCOUNTER — Telehealth: Payer: Self-pay | Admitting: Family Medicine

## 2010-04-27 ENCOUNTER — Encounter: Payer: Self-pay | Admitting: Family Medicine

## 2010-04-27 LAB — CONVERTED CEMR LAB
MCHC: 31.9 g/dL (ref 30.0–36.0)
Platelets: 314 10*3/uL (ref 150–400)
Potassium: 4.7 meq/L (ref 3.5–5.3)
RBC: 4 M/uL (ref 3.87–5.11)
RDW: 13.8 % (ref 11.5–15.5)
Sodium: 145 meq/L (ref 135–145)

## 2010-05-18 ENCOUNTER — Ambulatory Visit: Payer: Self-pay | Admitting: Family Medicine

## 2010-05-18 LAB — CONVERTED CEMR LAB: OCCULT 1: NEGATIVE

## 2010-05-22 DIAGNOSIS — R634 Abnormal weight loss: Secondary | ICD-10-CM

## 2010-07-13 ENCOUNTER — Telehealth: Payer: Self-pay | Admitting: Family Medicine

## 2010-07-26 ENCOUNTER — Ambulatory Visit: Payer: Self-pay | Admitting: Family Medicine

## 2010-08-24 LAB — CONVERTED CEMR LAB
Albumin: 4.3 g/dL (ref 3.5–5.2)
CO2: 28 meq/L (ref 19–32)
Chloride: 105 meq/L (ref 96–112)
HDL: 61 mg/dL (ref >39)
LDL Cholesterol: 88 mg/dL (ref 0–99)
Potassium: 4.3 meq/L (ref 3.5–5.3)
Sodium: 143 meq/L (ref 135–145)
Total Bilirubin: 0.5 mg/dL (ref 0.3–1.2)
Total CHOL/HDL Ratio: 2.9
Total Protein: 7.1 g/dL (ref 6.0–8.3)

## 2010-08-30 ENCOUNTER — Ambulatory Visit: Payer: Self-pay | Admitting: Family Medicine

## 2010-08-30 DIAGNOSIS — F411 Generalized anxiety disorder: Secondary | ICD-10-CM | POA: Insufficient documentation

## 2010-08-30 DIAGNOSIS — K319 Disease of stomach and duodenum, unspecified: Secondary | ICD-10-CM | POA: Insufficient documentation

## 2010-08-30 DIAGNOSIS — R1013 Epigastric pain: Secondary | ICD-10-CM

## 2010-08-31 ENCOUNTER — Encounter: Payer: Self-pay | Admitting: Family Medicine

## 2010-09-04 ENCOUNTER — Encounter: Payer: Self-pay | Admitting: Family Medicine

## 2010-09-07 ENCOUNTER — Ambulatory Visit (HOSPITAL_COMMUNITY)
Admission: RE | Admit: 2010-09-07 | Discharge: 2010-09-07 | Payer: Self-pay | Source: Home / Self Care | Admitting: Family Medicine

## 2010-09-20 ENCOUNTER — Encounter: Payer: Self-pay | Admitting: Family Medicine

## 2010-10-11 ENCOUNTER — Telehealth: Payer: Self-pay | Admitting: Family Medicine

## 2010-11-11 ENCOUNTER — Encounter: Payer: Self-pay | Admitting: Family Medicine

## 2010-11-12 ENCOUNTER — Encounter: Payer: Self-pay | Admitting: Family Medicine

## 2010-11-13 ENCOUNTER — Encounter: Payer: Self-pay | Admitting: Family Medicine

## 2010-11-21 NOTE — Letter (Signed)
Summary: eye exam  eye exam   Imported By: Lind Guest 09/04/2010 16:49:26  _____________________________________________________________________  External Attachment:    Type:   Image     Comment:   External Document

## 2010-11-21 NOTE — Assessment & Plan Note (Signed)
Summary: er follow up- room 1   Vital Signs:  Patient profile:   77 year old female Menstrual status:  postmenopausal Height:      63 inches Weight:      128 pounds BMI:     22.76 O2 Sat:      98 % on Room air Pulse rate:   65 / minute Resp:     16 per minute BP sitting:   140 / 70  (left arm)  Vitals Entered By: Adella Hare LPN (January 25, 6961 11:02 AM) CC: er follow up Is Patient Diabetic? No Pain Assessment Patient in pain? no        CC:  er follow up.  History of Present Illness: Pt states she has had dry eyes, & nasal congestion last couple of days.  She has been taking either benadryl or an otc allery med with decong as needed.  Doesn't like that she has to take these frequently throughout the day & wonders if I could prescribe something that is once a day. Claritin makes her itch allover.  Zyrtec has worked well in past.  Would like a prescription.  Went to ER a few days ago.  Felt weak & achey.  Appetite was nl.  "Just didn't feel good."  This has resolved now.  Was dx as a viral infection.    Current Medications (verified): 1)  Calcium Plus Vitamin D 600-100 Mg-Unit  Caps (Calcium Carbonate-Vitamin D) .... One Cap By Mouth Two Times A Day 2)  Multivitamins   Caps (Multiple Vitamin) .... Take 1 Tablet By Mouth Once A Day 3)  Benadryl 25 Mg  Caps (Diphenhydramine Hcl) .... Take 1 Tab By Mouth At Bedtime 4)  Aspirin 81 Mg  Tbec (Aspirin) .... Take 1 Tablet By Mouth Once A Day 5)  Omeprazole 20 Mg Cpdr (Omeprazole) .... One Cap By Mouth Qd 6)  Clonazepam 0.5 Mg Tabs (Clonazepam) .... One Half Tab By Mouth Two Times A Day Prn 7)  Lipitor 10 Mg Tabs (Atorvastatin Calcium) .... One Tab By Mouth Qhs 8)  Amoxicillin 500 Mg Tabs (Amoxicillin) .... Take 1 Tablet By Mouth Three Times A Day  Allergies (verified): 1)  ! * Nasonex  Past History:  Past medical history reviewed for relevance to current acute and chronic problems.  Past Medical History: Reviewed history  from 06/07/2008 and no changes required. OSTEOPOROSIS (ICD-733.00) GERD (ICD-530.81) HYPERLIPIDEMIA (ICD-272.4) ALLERGIES  Review of Systems General:  Denies chills and fever. ENT:  Complains of nasal congestion; denies earache, sinus pressure, and sore throat. CV:  Denies chest pain or discomfort. Resp:  Complains of cough; denies shortness of breath. GI:  Denies nausea and vomiting. Allergy:  Complains of itching eyes and seasonal allergies; denies sneezing.  Physical Exam  General:  Well-developed,well-nourished,in no acute distress; alert,appropriate and cooperative throughout examination Head:  Normocephalic and atraumatic without obvious abnormalities. No apparent alopecia or balding. Ears:  External ear exam shows no significant lesions or deformities.  Otoscopic examination reveals clear canals, tympanic membranes are intact bilaterally without bulging, retraction, inflammation or discharge. Hearing is grossly normal bilaterally. Nose:  no external deformity, no sinus percussion tenderness, mucosal erythema, and mucosal edema.   Mouth:  Oral mucosa and oropharynx without lesions or exudates.  Teeth in good repair. Neck:  No deformities, masses, or tenderness noted. Lungs:  Normal respiratory effort, chest expands symmetrically. Lungs are clear to auscultation, no crackles or wheezes. Heart:  Normal rate and regular rhythm. S1 and S2  normal without gallop, murmur, click, rub or other extra sounds. Cervical Nodes:  No lymphadenopathy noted Psych:  Cognition and judgment appear intact. Alert and cooperative with normal attention span and concentration. No apparent delusions, illusions, hallucinations   Impression & Recommendations:  Problem # 1:  ALLERGIC RHINITIS (ICD-477.9) Assessment Deteriorated  Her updated medication list for this problem includes:    Benadryl 25 Mg Caps (Diphenhydramine hcl) .Marland Kitchen... Take 1 tab by mouth at bedtime    Cetirizine Hcl 10 Mg Tabs (Cetirizine  hcl) .Marland Kitchen... Take 1 daily as needed for allergies  Orders: Depo- Medrol 40mg  (J1030)  Problem # 2:  VIRAL INFECTION (ICD-079.99) Assessment: Improved Sxs have resolved.  Her updated medication list for this problem includes:    Aspirin 81 Mg Tbec (Aspirin) .Marland Kitchen... Take 1 tablet by mouth once a day  Complete Medication List: 1)  Calcium Plus Vitamin D 600-100 Mg-unit Caps (Calcium carbonate-vitamin d) .... One cap by mouth two times a day 2)  Multivitamins Caps (Multiple vitamin) .... Take 1 tablet by mouth once a day 3)  Benadryl 25 Mg Caps (Diphenhydramine hcl) .... Take 1 tab by mouth at bedtime 4)  Aspirin 81 Mg Tbec (Aspirin) .... Take 1 tablet by mouth once a day 5)  Omeprazole 20 Mg Cpdr (Omeprazole) .... One cap by mouth qd 6)  Clonazepam 0.5 Mg Tabs (Clonazepam) .... One half tab by mouth two times a day prn 7)  Lipitor 10 Mg Tabs (Atorvastatin calcium) .... One tab by mouth qhs 8)  Amoxicillin 500 Mg Tabs (Amoxicillin) .... Take 1 tablet by mouth three times a day 9)  Cetirizine Hcl 10 Mg Tabs (Cetirizine hcl) .... Take 1 daily as needed for allergies  Patient Instructions: 1)  Keep your appt for your physical. 2)  I have prescribed Zyrtec for you to take as needed for allergies.  Do not take Benadryl or the other over the counter allergy medication with Zyrtec. 3)  You have received an injection of Depo Medrol today today. Prescriptions: CETIRIZINE HCL 10 MG TABS (CETIRIZINE HCL) take 1 daily as needed for allergies  #30 x 2   Entered and Authorized by:   Esperanza Sheets PA   Signed by:   Esperanza Sheets PA on 01/25/2010   Method used:   Electronically to        [facility] [address]retail)       [address] [place]  [address]       Ph: [phone]       Fax: [phone]   RxID:   [id number]   Appended Document: er follow up- room 1   Medication Administration  Injection # 1:    Medication: Depo- Medrol 40mg     Diagnosis: ALLERGIC RHINITIS (ICD-477.9)     Route: IM    Site: RUOQ gluteus    Exp Date: 08/12    Lot #: [id number]    Mfr: [employer]    Patient tolerated injection without complications    Given by: Adella Hare LPN (January 25, 4033 3:31 PM)  Orders Added: 1)  Depo- Medrol 40mg  [J1030] 2)  Admin of Therapeutic Inj  intramuscular or subcutaneous [74259]

## 2010-11-21 NOTE — Assessment & Plan Note (Signed)
Summary: physical   Vital Signs:  Patient profile:   77 year old female Menstrual status:  postmenopausal Height:      63 inches Weight:      126.25 pounds BMI:     22.45 O2 Sat:      97 % Pulse rate:   87 / minute Pulse rhythm:   regular Resp:     16 per minute BP sitting:   124 / 70  (left arm) Cuff size:   regular  Vitals Entered By: Everitt Amber LPN (February 14, 2010 1:55 PM) CC: was feeling so bad she couldn't hardly go. Went to the Er this past weekend. Had taken Zyrtec and she thinks its too much for her. She had bloodwork, ekg and chest xray and nothing was wrong. She started taking the benadryl and quit the zyrtec. She's better since starting the Benadryl   CC:  was feeling so bad she couldn't hardly go. Went to the Er this past weekend. Had taken Zyrtec and she thinks its too much for her. She had bloodwork and ekg and chest xray and nothing was wrong. She started taking the benadryl and quit the zyrtec. She's better since starting the Benadryl.  History of Present Illness: Reports  that she hasd been doing well up untilthe time she started taking zyrtec for allergies, which she thinks entirely did her in.Since going back on benadryl she feels better. Denies recent fever or chills. Denies sinus pressure, nasal congestion , ear pain or sore throat. Denies chest congestion, or cough productive of sputum. Denies chest pain, palpitations, PND, orthopnea or leg swelling. Denies abdominal pain, nausea, vomitting, diarrhea or constipation. Denies change in bowel movements or bloody stool. Denies dysuria , frequency, incontinence or hesitancy.  Denies vertigo, seizures. Denies depression, anxiety or insomnia. Denies  rash, lesions, or itch.     Current Medications (verified): 1)  Calcium Plus Vitamin D 600-100 Mg-Unit  Caps (Calcium Carbonate-Vitamin D) .... One Cap By Mouth Two Times A Day 2)  Multivitamins   Caps (Multiple Vitamin) .... Take 1 Tablet By Mouth Once A Day 3)   Benadryl 25 Mg  Caps (Diphenhydramine Hcl) .... Take 1 Tab By Mouth At Bedtime 4)  Aspirin 81 Mg  Tbec (Aspirin) .... Take 1 Tablet By Mouth Once A Day 5)  Omeprazole 20 Mg Cpdr (Omeprazole) .... One Cap By Mouth Qd 6)  Clonazepam 0.5 Mg Tabs (Clonazepam) .... One Half Tab By Mouth Two Times A Day Prn 7)  Lipitor 10 Mg Tabs (Atorvastatin Calcium) .... One Tab By Mouth Qhs  Allergies: 1)  ! * Nasonex 2)  ! * Zyrtec  Review of Systems      See HPI General:  Complains of fatigue, sleep disorder, and sweats. Eyes:  Complains of vision loss-both eyes; denies discharge, eye pain, and red eye. MS:  Complains of joint pain, low back pain, mid back pain, muscle weakness, and stiffness. Neuro:  Complains of headaches; intermittent sinus headaches. Endo:  Denies cold intolerance, excessive hunger, excessive thirst, excessive urination, heat intolerance, polyuria, and weight change. Heme:  Denies abnormal bruising and bleeding. Allergy:  Complains of seasonal allergies.  Physical Exam  General:  Well-developed,well-nourished,in no acute distress; alert,appropriate and cooperative throughout examination HEENT: No facial asymmetry,  EOMI, No sinus tenderness, TM's Clear, oropharynx  pink and moist. Bruit in neck  Chest: Clear to auscultation bilaterally.  CVS: S1, S2, No murmurs, No S3.   Abd: Soft, Nontender.  ZO:XWRUEAVWU ROM spine, hips, shoulders and  knees.  Ext: No edema.   CNS: CN 2-12 intact, power tone and sensation normal throughout.   Skin: Intact, no visible lesions or rashes.  Psych: Good eye contact, normal affect.  Memory intact, not anxious or depressed appearing.    Impression & Recommendations:  Problem # 1:  CAROTID BRUIT (ICD-785.9) Assessment Comment Only  Orders: Radiology Referral (Radiology)  Problem # 2:  ALLERGIC RHINITIS (ICD-477.9) Assessment: Unchanged  The following medications were removed from the medication list:    Cetirizine Hcl 10 Mg Tabs  (Cetirizine hcl) .Marland Kitchen... Take 1 daily as needed for allergies Her updated medication list for this problem includes:    Benadryl 25 Mg Caps (Diphenhydramine hcl) .Marland Kitchen... Take 1 tab by mouth at bedtime, feels she had a bad rxn to certrizine  Problem # 3:  HYPERLIPIDEMIA (ICD-272.4) Assessment: Comment Only  Her updated medication list for this problem includes:    Lipitor 10 Mg Tabs (Atorvastatin calcium) ..... One tab by mouth qhs  Labs Reviewed: SGOT: 28 (11/24/2009)   SGPT: 21 (11/24/2009)   HDL:63 (11/24/2009), 46 (07/12/2009)  LDL:116 (11/24/2009), 79 (16/07/9603)  Chol:206 (11/24/2009), 168 (07/12/2009)  Trig:134 (11/24/2009), 216 (07/12/2009)  Problem # 4:  NECK PAIN (ICD-723.1) Assessment: Unchanged  Her updated medication list for this problem includes:    Aspirin 81 Mg Tbec (Aspirin) .Marland Kitchen... Take 1 tablet by mouth once a day  Complete Medication List: 1)  Calcium Plus Vitamin D 600-100 Mg-unit Caps (Calcium carbonate-vitamin d) .... One cap by mouth two times a day 2)  Multivitamins Caps (Multiple vitamin) .... Take 1 tablet by mouth once a day 3)  Benadryl 25 Mg Caps (Diphenhydramine hcl) .... Take 1 tab by mouth at bedtime 4)  Aspirin 81 Mg Tbec (Aspirin) .... Take 1 tablet by mouth once a day 5)  Omeprazole 20 Mg Cpdr (Omeprazole) .... One cap by mouth qd 6)  Clonazepam 0.5 Mg Tabs (Clonazepam) .... One half tab by mouth two times a day prn 7)  Lipitor 10 Mg Tabs (Atorvastatin calcium) .... One tab by mouth qhs  Patient Instructions: 1)  Please schedule a follow-up appointment in 2 months. 2)  you are being referred fro a carotid doppler to see if you have blockage in your neck arteries

## 2010-11-21 NOTE — Assessment & Plan Note (Signed)
Summary: office visit   Vital Signs:  Patient profile:   77 year old female Menstrual status:  postmenopausal Height:      63 inches Weight:      123.50 pounds BMI:     21.96 O2 Sat:      100 % Pulse rate:   71 / minute BP sitting:   120 / 70  (left arm) CC: went to er on Sunday, due to headaches, broke out after taking medication from er, amox tr-k clv was the medication/was told sinus infection-rm 1 Is Patient Diabetic? No   CC:  went to er on Sunday, due to headaches, broke out after taking medication from er, and amox tr-k clv was the medication/was told sinus infection-rm 1.  History of Present Illness: Pt went to ER Sun 6/12 due to sinus pressure, nasal congestion, headache, & swollen gland in Lt neck.  Was dx with Sinusitis.  Rxd Augmentin.  Pt broke out with itchy welts after the 2nd dose.  She discontinued after talking to the pharmacist and toke 1 dose of Benadryl.  The itching and welts have resolved.  The swollen gland in her Lt neck is also better.  Pt states she has taken Amoxicillin and ZPak in the past.  These have worked well for her & no allergy.  Pt states she has scheduled an appt with her ENT for f/u.  Hx of chronic sinusitis.  Has had 2 sinus surgeries in the past.  No difficulty breathing or swallowing.    Allergies: 1)  ! * Nasonex 2)  ! * Zyrtec 3)  ! Augmentin (Amoxicillin-Pot Clavulanate)  Past History:  Past medical history reviewed for relevance to current acute and chronic problems.  Past Medical History: Reviewed history from 06/07/2008 and no changes required. OSTEOPOROSIS (ICD-733.00) GERD (ICD-530.81) HYPERLIPIDEMIA (ICD-272.4) ALLERGIES  Review of Systems General:  Complains of chills; denies fever. ENT:  Complains of nasal congestion; denies earache, postnasal drainage, and sore throat. CV:  Denies chest pain or discomfort. Resp:  Denies cough and shortness of breath. Derm:  Complains of itching and rash.  Physical Exam  General:   Well-developed,well-nourished,in no acute distress; alert,appropriate and cooperative throughout examination Head:  Normocephalic and atraumatic without obvious abnormalities. No apparent alopecia or balding. Ears:  External ear exam shows no significant lesions or deformities.  Otoscopic examination reveals clear canals, tympanic membranes are intact bilaterally without bulging, retraction, inflammation or discharge. Hearing is grossly normal bilaterally. Nose:  External nasal examination shows no deformity or inflammation. Nasal mucosa are pink and moist without lesions or exudates.no sinus percussion tenderness.   Mouth:  Oral mucosa and oropharynx without lesions or exudates.  Neck:  No deformities, masses, or tenderness noted. Lungs:  Normal respiratory effort, chest expands symmetrically. Lungs are clear to auscultation, no crackles or wheezes. Heart:  Normal rate and regular rhythm. S1 and S2 normal without gallop, murmur, click, rub or other extra sounds. Skin:  Intact without suspicious lesions or rashes Cervical Nodes:  No lymphadenopathy noted Psych:  Cognition and judgment appear intact. Alert and cooperative with normal attention span and concentration. No apparent delusions, illusions, hallucinations   Impression & Recommendations:  Problem # 1:  ACUTE MAXILLARY SINUSITIS (ICD-461.0) Assessment New  Her updated medication list for this problem includes:    Zithromax Z-pak 250 Mg Tabs (Azithromycin) .Marland Kitchen... Take once daily as directed    Fluticasone Propionate 50 Mcg/act Susp (Fluticasone propionate) ..... Use 2 sprays each nostril once daily  Problem # 2:  URTICARIA (ICD-708.9) Assessment: Improved Noted Augmentin allergy in chart.  Advised pt that she should not take Amoxicillin or any penicillins in the future.  Complete Medication List: 1)  Calcium Plus Vitamin D 600-100 Mg-unit Caps (Calcium carbonate-vitamin d) .... One cap by mouth two times a day 2)  Multivitamins Caps  (Multiple vitamin) .... Take 1 tablet by mouth once a day 3)  Benadryl 25 Mg Caps (Diphenhydramine hcl) .... Take 1 tab by mouth at bedtime 4)  Aspirin 81 Mg Tbec (Aspirin) .... Take 1 tablet by mouth once a day 5)  Omeprazole 20 Mg Cpdr (Omeprazole) .... One cap by mouth qd 6)  Clonazepam 0.5 Mg Tabs (Clonazepam) .... One half tab by mouth two times a day prn 7)  Lipitor 10 Mg Tabs (Atorvastatin calcium) .... One tab by mouth qhs 8)  Zithromax Z-pak 250 Mg Tabs (Azithromycin) .... Take once daily as directed 9)  Fluticasone Propionate 50 Mcg/act Susp (Fluticasone propionate) .... Use 2 sprays each nostril once daily  Patient Instructions: 1)  Keep your appt with Dr Lodema Hong in July. 2)  I have marked your chart as allergy to Augmentin.   3)  I have prescribed a ZPak. Prescriptions: FLUTICASONE PROPIONATE 50 MCG/ACT SUSP (FLUTICASONE PROPIONATE) use 2 sprays each nostril once daily  #1 x 2   Entered and Authorized by:   Esperanza Sheets PA   Signed by:   Esperanza Sheets PA on 04/04/2010   Method used:   [facility] to        [facility] [address]       [address] [place]  [address]       Ph: [phone]       Fax: [phone]   RxID:   [id number] ZITHROMAX Z-PAK 250 MG TABS (AZITHROMYCIN) take once daily as directed  #1 pack x 0   Entered and Authorized by:   Esperanza Sheets PA   Signed by:   Esperanza Sheets PA on 04/04/2010   Method used:   [facility] [address]       [address] [place]  [address]       Ph: [phone]       Fax: [phone]   RxID:   [id number]

## 2010-11-21 NOTE — Progress Notes (Signed)
Summary: CK ON THYROID  Phone Note Call from Patient   Summary of Call: WANTS TO KNOW CAN SHE ADD THROID CK ON HER LAB WORK CALL BACK AT [phone] Initial call taken by: Lind Guest,  April 27, 2010 1:13 PM  Follow-up for Phone Call        We just checked her thyroid level in June and it was good. Follow-up by: Esperanza Sheets PA,  April 28, 2010 8:21 AM  Additional Follow-up for Phone Call Additional follow up Details #1::        PATIENT AWARE Additional Follow-up by: Lind Guest,  April 28, 2010 10:38 AM

## 2010-11-21 NOTE — Progress Notes (Signed)
Summary: rx refill  Phone Note Call from Patient   Summary of Call: pt needs appt for liptor and prolsic [phone] Initial call taken by: Rudene Anda,  July 13, 2010 4:20 PM  Follow-up for Phone Call        Rx Called In Follow-up by: Adella Hare LPN,  July 13, 2010 4:27 PM    Prescriptions: LIPITOR 10 MG TABS (ATORVASTATIN CALCIUM) one tab by mouth qhs  #30.0 Tablet x 1   Entered by:   Adella Hare LPN   Authorized by:   Syliva Overman MD   Signed by:   Adella Hare LPN on 45/40/9811   Method used:   [facility] to        [facility] [address]       [address] [place]  [address]       Ph: [phone]       Fax: [phone]   RxID:   [id number] OMEPRAZOLE 20 MG CPDR (OMEPRAZOLE) ONE CAP by mouth QD  #30 Capsule x 1   Entered by:   Adella Hare LPN   Authorized by:   Syliva Overman MD   Signed by:   Adella Hare LPN on 66/44/0347   Method used:   [facility] [address]       [address], [place]  [address]       Ph: [phone]       Fax: [phone]   RxID:   [id number]

## 2010-11-21 NOTE — Progress Notes (Signed)
Summary: phone number to reach patient  Phone Note Call from Patient   Summary of Call: patient went home with her daughter if we need her we can reach her at [name] daughter [phone] or [phone] Initial call taken by: Curtis Sites,  April 25, 2010 4:29 PM

## 2010-11-21 NOTE — Assessment & Plan Note (Signed)
Summary: f up   Vital Signs:  Patient profile:   77 year old female Menstrual status:  postmenopausal Height:      63 inches Weight:      127 pounds BMI:     22.58 O2 Sat:      98 % on Room air Pulse rate:   88 / minute Pulse rhythm:   regular Resp:     16 per minute BP sitting:   118 / 62  (left arm)  Vitals Entered By: Mauricia Area CMA (August 30, 2010 1:12 PM)  O2 Flow:  Room air CC: Follow up Comments Did not bring meds   CC:  Follow up.  History of Present Illness: Reports  that she has been doing fairly well. Recently has had procedures on her eye, and she is very satisfied with the result. Denies recent fever or chills. Denies sinus pressure, nasal congestion , ear pain or sore throat. Denies chest congestion, or cough productive of sputum. Denies chest pain, palpitations, PND, orthopnea or leg swelling. Denies abdominal pain, nausea, vomitting, diarrhea or constipation. Denies change in bowel movements or bloody stool. Denies dysuria , frequency, incontinence or hesitancy. Denies  joint pain, swelling, or reduced mobility. Denies headaches, vertigo, seizures. Denies depression, anxiety or insomnia. Denies  rash, lesions, or itch.     Allergies (verified): 1)  ! * Nasonex 2)  ! * Zyrtec 3)  ! Augmentin (Amoxicillin-Pot Clavulanate)  Review of Systems      See HPI Eyes:  Complains of vision loss-both eyes; laser treatment to right eye,  Summer 2011, prob Sept, still having poor vision in left eye , getting monthly injections to left eye, std in Oct [address], expects 4 treatments in all, Dr Barbaraann Barthel. MS:  Complains of joint pain, mid back pain, and stiffness; 1 week ago after excess yard work she dev mid back pain and spasm. Derm:  Complains of lesion(s). Psych:  Complains of anxiety; denies depression, mental problems, suicidal thoughts/plans, thoughts of violence, and unusual visions or sounds; mild, not dependent on klonopin. Endo:  Denies cold intolerance,  excessive hunger, excessive thirst, and excessive urination. Heme:  Denies abnormal bruising and bleeding. Allergy:  Complains of seasonal allergies.  Physical Exam  General:  Well-developed,well-nourished,in no acute distress; alert,appropriate and cooperative throughout examination HEENT: No facial asymmetry,  EOMI, No sinus tenderness, TM's Clear, oropharynx  pink and moist.   Chest: Clear to auscultation bilaterally.  CVS: S1, S2, No murmurs, No S3.   Abd: Soft, Nontender.  MS: decreased  ROM spine, hips, shoulders and knees.  Ext: No edema.   CNS: CN 2-12 intact, power tone and sensation normal throughout.   Skin: Intact, no visible lesions or rashes.  Psych: Good eye contact, normal affect.  Memory intact, not anxious or depressed appearing.\par    Impression & Recommendations:  Problem # 1:  DYSPEPSIA (ICD-536.8) Assessment Deteriorated  Orders: Radiology Referral (Radiology)  Problem # 2:  GENERALIZED ANXIETY DISORDER (ICD-300.02) Assessment: Improved  The following medications were removed from the medication list:    Clonazepam 0.5 Mg Tabs (Clonazepam) ..... One half tab by mouth two times a day prn Her updated medication list for this problem includes:    Clonazepam 0.5 Mg Tabs (Clonazepam) ..... One half tablet once daily as needed for anxiety  Orders: Medicare Electronic Prescription [id number])  Problem # 3:  HYPERLIPIDEMIA (ICD-272.4) Assessment: Unchanged  Her updated medication list for this problem includes:    Lipitor 10 Mg Tabs (Atorvastatin calcium) .Marland KitchenMarland KitchenMarland KitchenMarland Kitchen  One tab by mouth qhs  Orders: Medicare Electronic Prescription [phone]) T-Lipid Profile [phone]) T-Hepatic Function [phone])  Labs Reviewed: SGOT: 21 (08/23/2010)   SGPT: 13 (08/23/2010)   HDL:61 (08/23/2010), 61 (03/30/2010)  LDL:88 (08/23/2010), 93 (03/30/2010)  Chol:179 (08/23/2010), 179 (03/30/2010)  Trig:151 (08/23/2010), 127 (03/30/2010)  Problem # 4:  GERD  (ICD-530.81) Assessment: Deteriorated  Her updated medication list for this problem includes:    Omeprazole 20 Mg Cpdr (Omeprazole) ..... One cap by mouth qd  Orders: Medicare Electronic Prescription [id number])  Complete Medication List: 1)  Calcium Plus Vitamin D 600-100 Mg-unit Caps (Calcium carbonate-vitamin d) .... One cap by mouth two times a day 2)  Multivitamins Caps (Multiple vitamin) .... Take 1 tablet by mouth once a day 3)  Benadryl 25 Mg Caps (Diphenhydramine hcl) .... Take 1 tab by mouth at bedtime 4)  Aspirin 81 Mg Tbec (Aspirin) .... Take 1 tablet by mouth once a day 5)  Omeprazole 20 Mg Cpdr (Omeprazole) .... One cap by mouth qd 6)  Lipitor 10 Mg Tabs (Atorvastatin calcium) .... One tab by mouth qhs 7)  Clonazepam 0.5 Mg Tabs (Clonazepam) .... One half tablet once daily as needed for anxiety  Patient Instructions: 1)  Please schedule a follow-up appointment in 4 months. 2)  It is important that you exercise regularly at least 20 minutes 5 times a week. If you develop chest pain, have severe difficulty breathing, or feel very tired , stop exercising immediately and seek medical attention. 3)  Hepatic Panel prior to visit, ICD-9: 4)  Lipid Panel prior to visit, ICD-9: 5)  fasting bnlood sugar and HBA1C    next 4 months 6)  You are referred for a mamogram and also for gall bladder studies 7)  Refills are sent in Prescriptions: LIPITOR 10 MG TABS (ATORVASTATIN CALCIUM) one tab by mouth qhs  #30 Tablet x 3   Entered by:   Adella Hare LPN   Authorized by:   Syliva Overman MD   Signed by:   Adella Hare LPN on 13/24/4010   Method used:   Electronically to        [facility] (retail)       [address], [place]  [address]       Ph: [phone]       Fax: [phone]   RxID:   [id number] OMEPRAZOLE 20 MG CPDR (OMEPRAZOLE) ONE CAP by mouth QD  #30 Capsule x 3   Entered by:   Adella Hare LPN   Authorized by:   Syliva Overman MD   Signed by:   Adella Hare LPN on 16/10/930   Method used:   Electronically to        [facility] [address]       [address] [place]  [address]       Ph: [phone]       Fax: [phone]   RxID:   [id number] CLONAZEPAM 0.5 MG TABS (CLONAZEPAM) one half tablet once daily as needed for anxiety  #15 x 3   Entered and Authorized by:   Syliva Overman MD   Signed by:   Syliva Overman MD on 08/30/2010   Method used:   Printed then faxed to ...       [facility] [address]       [address]  [place], [place]  [address]       Ph: [phone]       Fax: [phone]   RxID:   [id number]    Orders Added: 1)  Est. Patient Level IV [84132] 2)  Medicare Electronic Prescription [G8553] 3)  T-Lipid Profile [80061-22930] 4)  T-Hepatic Function [[address]] 5)  Radiology Referral [Radiology] 6)  Radiology Referral [Radiology]

## 2010-11-21 NOTE — Progress Notes (Signed)
Summary: ENT  ENT   Imported By: Lind Guest 04/13/2010 11:22:13  _____________________________________________________________________  External Attachment:    Type:   Image     Comment:   External Document

## 2010-11-21 NOTE — Assessment & Plan Note (Signed)
Summary: OV   Vital Signs:  Patient profile:   77 year old female Menstrual status:  postmenopausal Height:      63 inches Weight:      129 pounds BMI:     22.93 O2 Sat:      99 % Pulse rate:   76 / minute Pulse rhythm:   regular Resp:     16 per minute BP sitting:   124 / 70 Cuff size:   regular  Vitals Entered By: Everitt Amber (November 24, 2009 9:45 AM) CC: has been having pain in the left side of her head behind her ear, started yesterday, not a constant pain but if she moves it a certain way there is a shooting pain, left side of her face is also tender   CC:  has been having pain in the left side of her head behind her ear, started yesterday, not a constant pain but if she moves it a certain way there is a shooting pain, and left side of her face is also tender.  History of Present Illness: 2 day h/o left neck pain with shooting pains and tendr swollen left neck gland, pain behind her ear when she moves her head. Reports  thatprior to this she had been doing well.  Denies chest congestion, or cough productive of sputum. Denies chest pain, palpitations, PND, orthopnea or leg swelling. Denies abdominal pain, nausea, vomitting, diarrhea or constipation. Denies change in bowel movements or bloody stool. Denies dysuria , frequency, incontinence or hesitancy.  Denies headaches, vertigo, seizures. Denies depression, anxiety or insomnia. Denies  rash, lesions, or itch.     Allergies: 1)  ! * Nasonex  Review of Systems General:  Complains of chills and fatigue; 2 day history. ENT:  Complains of postnasal drainage and sinus pressure; 2 day history. MS:  Complains of joint pain, muscle aches, and stiffness; 2 day history. Heme:  Denies abnormal bruising and bleeding. Allergy:  Complains of itching eyes and seasonal allergies.  Physical Exam  General:  alert, well-nourished, and well-hydrated.Ill appearing. HEENT: No facial asymmetry,  EOMI, maxillary sinus tenderness,  TM's Clear, oropharynx  pink and moist. reduced rOM cervical spine  Chest: adequate air entry, few scattered wheezes CVS: S1, S2, No murmurs, No S3.   Abd: Soft,non tender. MS: decreased  ROM cervical spine, adequate in hips, shoulders and knees.  Ext: No edema.   CNS: CN 2-12 intact, power tone and sensation normal throughout.   Skin: Intact, no visible lesions or rashes.  Psych: Good eye contact, normal affect.  Memory intact, not anxious or depressed appearing.    Impression & Recommendations:  Problem # 1:  ACUTE MAXILLARY SINUSITIS (ICD-461.0) Assessment Comment Only  The following medications were removed from the medication list:    Penicillin V Potassium 500 Mg Tabs (Penicillin v potassium) .Marland Kitchen... Take 1 tablet by mouth three times a day Her updated medication list for this problem includes:    Amoxicillin 500 Mg Tabs (Amoxicillin) .Marland Kitchen... Take 1 tablet by mouth three times a day  Problem # 2:  NECK PAIN (ICD-723.1) Assessment: Deteriorated  Her updated medication list for this problem includes:    Aspirin 81 Mg Tbec (Aspirin) .Marland Kitchen... Take 1 tablet by mouth once a day  Problem # 3:  HYPERLIPIDEMIA (ICD-272.4) Assessment: Comment Only  Her updated medication list for this problem includes:    Lipitor 10 Mg Tabs (Atorvastatin calcium) ..... One tab by mouth qhs  Orders: T-Hepatic Function [id number]) T-Lipid Profile (  [phone])  Labs Reviewed: SGOT: 21 (07/12/2009)   SGPT: 14 (07/12/2009)   HDL:46 (07/12/2009), 58 (12/16/2008)  LDL:79 (07/12/2009), 100 (09/81/1914)  Chol:168 (07/12/2009), 184 (12/16/2008)  Trig:216 (07/12/2009), 132 (12/16/2008)  Problem # 4:  GERD (ICD-530.81) Assessment: Improved  Her updated medication list for this problem includes:    Omeprazole 20 Mg Cpdr (Omeprazole) ..... One cap by mouth qd  Complete Medication List: 1)  Calcium Plus Vitamin D 600-100 Mg-unit Caps (Calcium carbonate-vitamin d) .... One cap by mouth two times a day 2)   Multivitamins Caps (Multiple vitamin) .... Take 1 tablet by mouth once a day 3)  Benadryl 25 Mg Caps (Diphenhydramine hcl) .... Take 1 tab by mouth at bedtime 4)  Aspirin 81 Mg Tbec (Aspirin) .... Take 1 tablet by mouth once a day 5)  Omeprazole 20 Mg Cpdr (Omeprazole) .... One cap by mouth qd 6)  Clonazepam 0.5 Mg Tabs (Clonazepam) .... One half tab by mouth two times a day prn 7)  Lipitor 10 Mg Tabs (Atorvastatin calcium) .... One tab by mouth qhs 8)  Amoxicillin 500 Mg Tabs (Amoxicillin) .... Take 1 tablet by mouth three times a day  Other Orders: T-TSH [id number]) T-Vitamin D (25-Hydroxy) [id number])  Patient Instructions: 1)  CPE in march or April. 2)  Hepatic Panel prior to visit, ICD-9: 3)  Lipid Panel prior to visit, ICD-9: 4)  TSH prior to visit, ICD-9: 5)  Vit D level today 6)  Yoou are being treated for sinusitis , meds are sent  in for this. 7)  I hope you feel better soon. Prescriptions: AMOXICILLIN 500 MG TABS (AMOXICILLIN) Take 1 tablet by mouth three times a day  #30 x 0   Entered and Authorized by:   Syliva Overman MD   Signed by:   Syliva Overman MD on 11/24/2009   Method used:   Electronically to        [facility] [address]       [address] [place]  [address]       Ph: [phone]       Fax: [phone]   RxID:   [id number]

## 2010-11-21 NOTE — Letter (Signed)
Summary: MEDICAL REQUEST  MEDICAL REQUEST   Imported By: Lind Guest 08/31/2010 09:26:53  _____________________________________________________________________  External Attachment:    Type:   Image     Comment:   External Document

## 2010-11-21 NOTE — Assessment & Plan Note (Signed)
Summary: physical   Vital Signs:  Patient profile:   77 year old female Menstrual status:  postmenopausal Height:      63 inches Weight:      122.25 pounds BMI:     21.73 O2 Sat:      100 % on Room air Pulse rate:   73 / minute Resp:     16 per minute BP sitting:   140 / 70  (left arm)  Vitals Entered By: Adella Hare LPN (May 18, 2010 2:30 PM)  O2 Flow:  Room air CC: physical Is Patient Diabetic? No Pain Assessment Patient in pain? no        CC:  physical.  History of Present Illness: Reports  that she is doing fairly well. She is however concerned about her weight loss, but staes she really does not eat as she knows that she should  Denies recent fever or chills.and post nasal drainage, no ear pain or sore throat. Denies chest congestion, or cough productive of sputum. Denies chest pain, palpitations, PND, orthopnea or leg swelling. Denies abdominal pain, nausea, vomitting, diarrhea or constipation. Denies change in bowel movements or bloody stool. Denies dysuria , frequency, incontinence or hesitancy.  Denies headaches, vertigo, seizures. Denies depression, anxiety or insomnia. Denies  rash, lesions, or itch.     Allergies (verified): 1)  ! * Nasonex 2)  ! * Zyrtec 3)  ! Augmentin (Amoxicillin-Pot Clavulanate)  Review of Systems      See HPI General:  Complains of fatigue. Eyes:  Complains of vision loss-both eyes. MS:  Complains of stiffness. Endo:  Denies cold intolerance, excessive hunger, excessive thirst, excessive urination, heat intolerance, polyuria, and weight change. Heme:  Denies abnormal bruising and bleeding. Allergy:  Complains of seasonal allergies; denies hives or rash and itching eyes.  Physical Exam  General:  Well-developed,well-nourished,in no acute distress; alert,appropriate and cooperative throughout examination HEENT: No facial asymmetry,  EOMI, No sinus tenderness, TM's Clear, oropharynx  pink and moist.   Chest: Clear to  auscultation bilaterally.  CVS: S1, S2, No murmurs, No S3.   Abd: Soft, Nontender.  MS: decreased  ROM spine, hips, shoulders and knees.  Ext: No edema.   CNS: CN 2-12 intact, power tone and sensation normal throughout.   Skin: Intact, no visible lesions or rashes.  Psych: Good eye contact, normal affect.  Memory intact, not anxious or depressed appearing.    Impression & Recommendations:  Problem # 1:  VIRAL INFECTION (ICD-079.99) Assessment Unchanged  Her updated medication list for this problem includes:    Aspirin 81 Mg Tbec (Aspirin) .Marland Kitchen... Take 1 tablet by mouth once a day  Problem # 2:  HYPERLIPIDEMIA (ICD-272.4) Assessment: Unchanged  Her updated medication list for this problem includes:    Lipitor 10 Mg Tabs (Atorvastatin calcium) ..... One tab by mouth qhs  Orders: T-Hepatic Function [phone]) T-Lipid Profile [phone])  Labs Reviewed: SGOT: 20 (03/30/2010)   SGPT: 13 (03/30/2010)   HDL:61 (03/30/2010), 63 (11/24/2009)  LDL:93 (03/30/2010), 116 (29/56/2130)  Chol:179 (03/30/2010), 206 (11/24/2009)  Trig:127 (03/30/2010), 134 (11/24/2009)  Problem # 3:  GERD (ICD-530.81) Assessment: Improved  Her updated medication list for this problem includes:    Omeprazole 20 Mg Cpdr (Omeprazole) ..... One cap by mouth qd  Problem # 4:  WEIGHT LOSS (ICD-783.21) Assessment: Comment Only pt encouraged to inc oral intake  Complete Medication List: 1)  Calcium Plus Vitamin D 600-100 Mg-unit Caps (Calcium carbonate-vitamin d) .... One cap by mouth two times a  day 2)  Multivitamins Caps (Multiple vitamin) .... Take 1 tablet by mouth once a day 3)  Benadryl 25 Mg Caps (Diphenhydramine hcl) .... Take 1 tab by mouth at bedtime 4)  Aspirin 81 Mg Tbec (Aspirin) .... Take 1 tablet by mouth once a day 5)  Omeprazole 20 Mg Cpdr (Omeprazole) .... One cap by mouth qd 6)  Clonazepam 0.5 Mg Tabs (Clonazepam) .... One half tab by mouth two times a day prn 7)  Lipitor 10 Mg Tabs  (Atorvastatin calcium) .... One tab by mouth qhs  Other Orders: T-Basic Metabolic Panel [id number]) Hemoccult Guaiac-1 spec.(in office) (40347)  Patient Instructions: 1)  Please schedule a follow-up appointment in 3.5  months. 2)  pls make sure that you eat more regularly and morefood so that you do not continue to lose weight ,ad feel dizzy and light headed. 3)  Keep active. 4)  BMP prior to visit, ICD-9: 5)  Hepatic Panel prior to visit, ICD-9:  fasting in 3.5 months. 6)  Lipid Panel prior to visit, ICD-9:   Orders Added: 1)  Est. Patient Level IV [42595] 2)  T-Basic Metabolic Panel [[id number]] 3)  T-Hepatic Function [80076-22960] 4)  T-Lipid Profile [80061-22930] 5)  Hemoccult Guaiac-1 spec.(in office) [82270]   Laboratory Results    Stool - Occult Blood Hemmoccult #1: negative Date: 05/18/2010 Comments: [id number] 9r 8/11 110 12

## 2010-11-21 NOTE — Assessment & Plan Note (Signed)
Summary: flu shot  Nurse Visit  Comments Patient in to recieve flu vaccine     Allergies: 1)  ! * Nasonex 2)  ! * Zyrtec 3)  ! Augmentin (Amoxicillin-Pot Clavulanate)  Orders Added: 1)  Influenza Vaccine MCR [00025]   Influenza Vaccine    Vaccine Type: Fluvax MCR    Site: right deltoid    Mfr: novartis     Dose: 0.5 ml    Route: IM    Given by: Everitt Amber LPN    Exp. Date: 02/2011    Lot #: [id number]  Flu Vaccine Consent Questions    Do you have a history of severe allergic reactions to this vaccine? no    Any prior history of allergic reactions to egg and/or gelatin? no    Do you have a sensitivity to the preservative Thimersol? no    Do you have a past history of Guillan-Barre Syndrome? no    Do you currently have an acute febrile illness? no    Have you ever had a severe reaction to latex? no    Vaccine information given and explained to patient? no    Are you currently pregnant? no

## 2010-11-21 NOTE — Assessment & Plan Note (Signed)
Summary: rib tenderness- room 2   Vital Signs:  Patient profile:   77 year old female Menstrual status:  postmenopausal Height:      63 inches Weight:      130.25 pounds BMI:     23.16 O2 Sat:      97 % on Room air Pulse rate:   82 / minute Resp:     16 per minute BP sitting:   128 / 60  (left arm)  Vitals Entered By: Adella Hare LPN (January 11, 2010 3:46 PM) CC: soreness in rib area Is Patient Diabetic? No Pain Assessment Patient in pain? no        CC:  soreness in rib area.  History of Present Illness: Pt states that she was cleaning under her fridge with vaccuum 1 wk ago today.  When she stood up from being on the floor she felt something pull in her Lt ribs.  She didn't have much discomfort until 4-5 days later.  Now is painful with rolling over & when changes position from sitting to standing. No difficulty breathing. Tylenol & ice help.   Current Medications (verified): 1)  Calcium Plus Vitamin D 600-100 Mg-Unit  Caps (Calcium Carbonate-Vitamin D) .... One Cap By Mouth Two Times A Day 2)  Multivitamins   Caps (Multiple Vitamin) .... Take 1 Tablet By Mouth Once A Day 3)  Benadryl 25 Mg  Caps (Diphenhydramine Hcl) .... Take 1 Tab By Mouth At Bedtime 4)  Aspirin 81 Mg  Tbec (Aspirin) .... Take 1 Tablet By Mouth Once A Day 5)  Omeprazole 20 Mg Cpdr (Omeprazole) .... One Cap By Mouth Qd 6)  Clonazepam 0.5 Mg Tabs (Clonazepam) .... One Half Tab By Mouth Two Times A Day Prn 7)  Lipitor 10 Mg Tabs (Atorvastatin Calcium) .... One Tab By Mouth Qhs 8)  Amoxicillin 500 Mg Tabs (Amoxicillin) .... Take 1 Tablet By Mouth Three Times A Day  Allergies (verified): 1)  ! * Nasonex  Past History:  Past medical history reviewed for relevance to current acute and chronic problems.  Past Medical History: Reviewed history from 06/07/2008 and no changes required. OSTEOPOROSIS (ICD-733.00) GERD (ICD-530.81) HYPERLIPIDEMIA (ICD-272.4) ALLERGIES  Review of Systems General:  Denies  chills and fever. CV:  Denies chest pain or discomfort, difficulty breathing at night, difficulty breathing while lying down, and palpitations. Resp:  Denies cough and shortness of breath. GI:  Denies abdominal pain, change in bowel habits, vomiting, and vomiting blood. MS:  Denies mid back pain.  Physical Exam  General:  Well-developed,well-nourished,in no acute distress; alert,appropriate and cooperative throughout examination Head:  Normocephalic and atraumatic without obvious abnormalities. No apparent alopecia or balding. Lungs:  Normal respiratory effort, chest expands symmetrically. Lungs are clear to auscultation, no crackles or wheezes. Heart:  Normal rate and regular rhythm. S1 and S2 normal without gallop, murmur, click, rub or other extra sounds. Abdomen:  Bowel sounds positive,abdomen soft and non-tender without masses, organomegaly or hernias noted. Msk:  TTP anterolateral inferior ribs.  Reproduces pain. Neurologic:  alert & oriented X3 and gait normal.   Skin:  Intact without suspicious lesions or rashes Psych:  Cognition and judgment appear intact. Alert and cooperative with normal attention span and concentration. No apparent delusions, illusions, hallucinations   Impression & Recommendations:  Problem # 1:  RIB PAIN, LEFT SIDED (ICD-786.50) Assessment New Offered injections - pt declined. States she doesn't like to take meds, so will continue Tylenol as needed. Discussed with pt that this can take  4-6 wks to improve.  Complete Medication List: 1)  Calcium Plus Vitamin D 600-100 Mg-unit Caps (Calcium carbonate-vitamin d) .... One cap by mouth two times a day 2)  Multivitamins Caps (Multiple vitamin) .... Take 1 tablet by mouth once a day 3)  Benadryl 25 Mg Caps (Diphenhydramine hcl) .... Take 1 tab by mouth at bedtime 4)  Aspirin 81 Mg Tbec (Aspirin) .... Take 1 tablet by mouth once a day 5)  Omeprazole 20 Mg Cpdr (Omeprazole) .... One cap by mouth qd 6)  Clonazepam  0.5 Mg Tabs (Clonazepam) .... One half tab by mouth two times a day prn 7)  Lipitor 10 Mg Tabs (Atorvastatin calcium) .... One tab by mouth qhs 8)  Amoxicillin 500 Mg Tabs (Amoxicillin) .... Take 1 tablet by mouth three times a day  Patient Instructions: 1)  Please schedule a follow-up appointment as needed. 2)  This may take 4-6 weeks to heal. 3)  Use ice 3-4 times a day x 10-15 mins. 4)  Take 650-1000mg  of Tylenol every 4-6 hours as needed for relief of pain or comfort of fever AVOID taking more than 4000mg   in a 24 hour period (can cause liver damage in higher doses).

## 2010-11-21 NOTE — Assessment & Plan Note (Signed)
Summary: OV   Vital Signs:  Patient profile:   77 year old female Menstrual status:  postmenopausal Weight:      122.25 pounds O2 Sat:      100 % Pulse rate:   72 / minute BP sitting:   122 / 76 CC: legs hurting having trouble walking, feels like she is drained, hasn't been in heat so she don't think its b/c she has gotten too hot/started feeling this way yesterday/had a couple of tick bites/room 2   CC:  legs hurting having trouble walking, feels like she is drained, and hasn't been in heat so she don't think its b/c she has gotten too hot/started feeling this way yesterday/had a couple of tick bites/room 2.  History of Present Illness: Pt states when she awoke this morning and "put my feet on the floor I had pain as soon as my feet touched the floor in the bottom of my feet and up my legs."  No LBP.  No trauma.  No swelling. Has felt tired and weak since last night.  Pt states she has intermittent itching.  No rash.  Sometimes Benadryl helps.  Pt states it seemed to start after taking Allegra awhile ago, but then admits she had it before the Allegra.  Her nerve pill works the best though when she starts itching.  She doesnt like to take any meds.  Pt denies fever or chills.  No cold syptoms or cough.  No abd pain or N/V.  Does have some urinary discomfort, but has this intermittently, and seems unchanged.  She has had 2 tick bites this summer.    Allergies: 1)  ! * Nasonex 2)  ! * Zyrtec 3)  ! Augmentin (Amoxicillin-Pot Clavulanate)  Past History:  Past medical history reviewed for relevance to current acute and chronic problems.  Past Medical History: Reviewed history from 06/07/2008 and no changes required. OSTEOPOROSIS (ICD-733.00) GERD (ICD-530.81) HYPERLIPIDEMIA (ICD-272.4) ALLERGIES  Review of Systems General:  Denies chills and fever. ENT:  Denies earache, nasal congestion, and sore throat. CV:  Denies chest pain or discomfort. Resp:  Denies cough and shortness  of breath. GI:  Denies abdominal pain, change in bowel habits, nausea, and vomiting. GU:  Complains of urinary frequency; denies dysuria. MS:  Complains of joint pain, loss of strength, and muscle aches; denies joint redness and joint swelling; PT REPORTS PAIN AND WEAKNESS IN BILAT LOWER LEGS, WORSE WITH WT BEARING. Derm:  Complains of itching; denies lesion(s) and rash. Neuro:  Denies numbness and tingling.  Physical Exam  General:  Well-developed,well-nourished,in no acute distress; alert,appropriate and cooperative throughout examination Head:  Normocephalic and atraumatic without obvious abnormalities. No apparent alopecia or balding. Ears:  External ear exam shows no significant lesions or deformities.  Otoscopic examination reveals clear canals, tympanic membranes are intact bilaterally without bulging, retraction, inflammation or discharge. Hearing is grossly normal bilaterally. Nose:  External nasal examination shows no deformity or inflammation. Nasal mucosa are pink and moist without lesions or exudates. Mouth:  Oral mucosa and oropharynx without lesions or exudates.  Neck:  No deformities, masses, or tenderness noted. Lungs:  Normal respiratory effort, chest expands symmetrically. Lungs are clear to auscultation, no crackles or wheezes. Heart:  Normal rate and regular rhythm. S1 and S2 normal without gallop, murmur, click, rub or other extra sounds. Msk:  Bilat LE:  normal ROM, no joint tenderness, no joint swelling, no joint warmth, and no redness over joints.   Pulses:  R posterior tibial normal,  R dorsalis pedis normal, L posterior tibial normal, and L dorsalis pedis normal.   Extremities:  No PTE.   Neurologic:  sensation intact to light touch, gait normal, and DTRs symmetrical and normal.   Skin:  Intact without suspicious lesions or rashes Cervical Nodes:  No lymphadenopathy noted Psych:  normally interactive, good eye contact, and not anxious appearing.     Impression &  Recommendations:  Problem # 1:  LEG PAIN, BILATERAL (ICD-729.5) Assessment New  Problem # 2:  FATIGUE (ICD-780.79) Assessment: Deteriorated  Orders: T-CBC No Diff ([id number]) T-Basic Metabolic Panel ([id number])  Problem # 3:  TICK BITE (ICD-E906.4) Assessment: New  Orders: T-CBC No Diff ([id number])  Problem # 4:  PRURITUS (ICD-698.9) Assessment: Unchanged Question allergy vs neurodermatitis.  Complete Medication List: 1)  Calcium Plus Vitamin D 600-100 Mg-unit Caps (Calcium carbonate-vitamin d) .... One cap by mouth two times a day 2)  Multivitamins Caps (Multiple vitamin) .... Take 1 tablet by mouth once a day 3)  Benadryl 25 Mg Caps (Diphenhydramine hcl) .... Take 1 tab by mouth at bedtime 4)  Aspirin 81 Mg Tbec (Aspirin) .... Take 1 tablet by mouth once a day 5)  Omeprazole 20 Mg Cpdr (Omeprazole) .... One cap by mouth qd 6)  Clonazepam 0.5 Mg Tabs (Clonazepam) .... One half tab by mouth two times a day prn 7)  Lipitor 10 Mg Tabs (Atorvastatin calcium) .... One tab by mouth qhs 8)  Doxycycline Hyclate 100 Mg Caps (Doxycycline hyclate) .... Take 1 two times a day x 7 days  Patient Instructions: 1)  Keep your next appt with Dr Lodema Hong. 2)  I have ordered blood work for you to have drawn today. 3)  Increase your fluids and rest. 4)  You may take Tylenol as needed. 5)  Take 650-1000mg  of Tylenol every 4-6 hours as needed for relief of pain or comfort of fever AVOID taking more than 4000mg   in a 24 hour period (can cause liver damage in higher doses). 6)  I have prescribed Doxycycline for you. Prescriptions: DOXYCYCLINE HYCLATE 100 MG CAPS (DOXYCYCLINE HYCLATE) take 1 two times a day x 7 days  #14 x 0   Entered and Authorized by:   Esperanza Sheets PA   Signed by:   Esperanza Sheets PA on 04/25/2010   Method used:   Electronically to        [facility]  [address] (retail)       [address], [place]  [address]       Ph: [phone]       Fax: [phone]   RxID:    [id number]   Laboratory Results   Urine Tests    Routine Urinalysis   Color: yellow Appearance: Clear Glucose: negative   (Normal Range: Negative) Bilirubin: negative   (Normal Range: Negative) Ketone: negative   (Normal Range: Negative) Spec. Gravity: 1.005   (Normal Range: 1.003-1.035) Blood: trace-intact   (Normal Range: Negative) pH: 5.0   (Normal Range: 5.0-8.0) Protein: negative   (Normal Range: Negative) Urobilinogen: 0.2 E.U./dl   (Normal Range: 0-1) Nitrite: negative   (Normal Range: Negative) Leukocyte Esterace: negative   (Normal Range: Negative)

## 2010-11-21 NOTE — Progress Notes (Signed)
Summary: ENT  ENT   Imported By: Lind Guest 05/01/2010 13:45:46  _____________________________________________________________________  External Attachment:    Type:   Image     Comment:   External Document

## 2010-11-23 NOTE — Progress Notes (Signed)
Summary: LIPTOR  Phone Note Call from Patient   Summary of Call: WANTS TO KNOW WHY HER LIPTOR WAS CHANGED TO A GENERIC BRAND [phone] Initial call taken by: Lind Guest,  October 11, 2010 2:08 PM  Follow-up for Phone Call        Phone Call Completed Follow-up by: Adella Hare LPN,  October 11, 2010 2:11 PM

## 2010-12-29 ENCOUNTER — Ambulatory Visit (INDEPENDENT_AMBULATORY_CARE_PROVIDER_SITE_OTHER): Payer: Medicare Other | Admitting: Family Medicine

## 2010-12-29 ENCOUNTER — Other Ambulatory Visit: Payer: Self-pay | Admitting: Family Medicine

## 2010-12-29 ENCOUNTER — Encounter: Payer: Self-pay | Admitting: Family Medicine

## 2010-12-29 DIAGNOSIS — F411 Generalized anxiety disorder: Secondary | ICD-10-CM

## 2010-12-29 DIAGNOSIS — E785 Hyperlipidemia, unspecified: Secondary | ICD-10-CM

## 2010-12-29 DIAGNOSIS — K3189 Other diseases of stomach and duodenum: Secondary | ICD-10-CM

## 2010-12-29 LAB — LIPID PANEL
Cholesterol: 201 mg/dL — ABNORMAL HIGH (ref 0–200)
HDL: 59 mg/dL (ref >39)
Total CHOL/HDL Ratio: 3.4 Ratio

## 2010-12-29 LAB — HEPATIC FUNCTION PANEL
ALT: 11 U/L (ref 0–35)
AST: 20 U/L (ref 0–37)
Alkaline Phosphatase: 51 U/L (ref 39–117)
Bilirubin, Direct: 0.1 mg/dL (ref 0.0–0.3)

## 2011-01-01 LAB — CONVERTED CEMR LAB
ALT: 11 units/L (ref 0–35)
Bilirubin, Direct: 0.1 mg/dL (ref 0.0–0.3)
Cholesterol: 201 mg/dL — ABNORMAL HIGH (ref 0–200)
Indirect Bilirubin: 0.4 mg/dL (ref 0.0–0.9)
Total Bilirubin: 0.5 mg/dL (ref 0.3–1.2)
VLDL: 37 mg/dL (ref 0–40)

## 2011-01-09 LAB — URINALYSIS, ROUTINE W REFLEX MICROSCOPIC
Glucose, UA: NEGATIVE mg/dL
Leukocytes, UA: NEGATIVE
Protein, ur: NEGATIVE mg/dL
Specific Gravity, Urine: 1.01 (ref 1.005–1.030)
pH: 5.5 (ref 5.0–8.0)

## 2011-01-09 LAB — DIFFERENTIAL
Basophils Absolute: 0 10*3/uL (ref 0.0–0.1)
Eosinophils Absolute: 0 10*3/uL (ref 0.0–0.7)
Eosinophils Relative: 0 % (ref 0–5)
Lymphs Abs: 2 10*3/uL (ref 0.7–4.0)
Neutrophils Relative %: 68 % (ref 43–77)

## 2011-01-09 LAB — COMPREHENSIVE METABOLIC PANEL
ALT: 19 U/L (ref 0–35)
Alkaline Phosphatase: 55 U/L (ref 39–117)
BUN: 9 mg/dL (ref 6–23)
CO2: 27 mEq/L (ref 19–32)
Chloride: 99 mEq/L (ref 96–112)
Glucose, Bld: 107 mg/dL — ABNORMAL HIGH (ref 70–99)
Potassium: 3.8 mEq/L (ref 3.5–5.1)
Sodium: 134 mEq/L — ABNORMAL LOW (ref 135–145)
Total Bilirubin: 0.5 mg/dL (ref 0.3–1.2)
Total Protein: 7.7 g/dL (ref 6.0–8.3)

## 2011-01-09 LAB — CK TOTAL AND CKMB (NOT AT ARMC)
Relative Index: INVALID (ref 0.0–2.5)
Total CK: 67 U/L (ref 7–177)

## 2011-01-09 LAB — URINE MICROSCOPIC-ADD ON

## 2011-01-09 LAB — CBC
Hemoglobin: 12.3 g/dL (ref 12.0–15.0)
RBC: 3.79 MIL/uL — ABNORMAL LOW (ref 3.87–5.11)
WBC: 8.1 10*3/uL (ref 4.0–10.5)

## 2011-01-09 NOTE — Assessment & Plan Note (Signed)
Summary: follow up   Vital Signs:  Patient profile:   77 year old female Menstrual status:  postmenopausal Height:      63 inches Weight:      128.25 pounds BMI:     22.80 O2 Sat:      98 % Pulse rate:   75 / minute Pulse rhythm:   regular Resp:     16 per minute BP sitting:   120 / 58  (left arm) Cuff size:   regular  Vitals Entered By: Everitt Amber LPN (December 28, 8117 10:34 AM) CC: Follow up chronic problems   CC:  Follow up chronic problems.  History of Present Illness: Reports  that she is doing well. Denies recent fever or chills. Denies sinus pressure, nasal congestion , ear pain or sore throat. Denies chest congestion, or cough productive of sputum. Denies chest pain, palpitations, PND, orthopnea or leg swelling. Denies abdominal pain, nausea, vomitting, diarrhea or constipation. Denies change in bowel movements or bloody stool. Denies dysuria , frequency, incontinence or hesitancy. Denies  joint pain, swelling, or reduced mobility. Denies headaches, vertigo, seizures. Denies depression, anxiety or insomnia. Denies  rash, lesions, or itch.she is concerned about thickening and ridging of 2 fingernails in the past several months.     Current Medications (verified): 1)  Calcium Plus Vitamin D 600-100 Mg-Unit  Caps (Calcium Carbonate-Vitamin D) .... One Cap By Mouth Two Times A Day 2)  Multivitamins   Caps (Multiple Vitamin) .... Take 1 Tablet By Mouth Once A Day 3)  Benadryl 25 Mg  Caps (Diphenhydramine Hcl) .... Take 1 Tab By Mouth At Bedtime 4)  Aspirin 81 Mg  Tbec (Aspirin) .... Take 1 Tablet By Mouth Once A Day 5)  Omeprazole 20 Mg Cpdr (Omeprazole) .... One Cap By Mouth Qd 6)  Lipitor 10 Mg Tabs (Atorvastatin Calcium) .... One Tab By Mouth Qhs 7)  Clonazepam 0.5 Mg Tabs (Clonazepam) .... One Half Tablet Once Daily As Needed For Anxiety  Allergies (verified): 1)  ! * Nasonex 2)  ! * Zyrtec 3)  ! Augmentin (Amoxicillin-Pot Clavulanate)  Review of Systems    See HPI Eyes:  Complains of vision loss-both eyes. MS:  Complains of joint pain and stiffness; mild intermittent. Derm:  Complains of changes in nail beds. Endo:  Denies cold intolerance, excessive hunger, excessive thirst, and excessive urination. Heme:  Denies abnormal bruising and bleeding. Allergy:  Complains of seasonal allergies.  Physical Exam  General:  Well-developed,well-nourished,in no acute distress; alert,appropriate and cooperative throughout examination HEENT: No facial asymmetry,  EOMI, No sinus tenderness, TM's Clear, oropharynx  pink and moist.   Chest: Clear to auscultation bilaterally.  CVS: S1, S2, No murmurs, No S3.   Abd: Soft, Nontender.  MS: decreased  ROM spine, hips, shoulders and knees.  Ext: No edema.   CNS: CN 2-12 intact, power tone and sensation normal throughout.   Skin: Intact, no visible lesions or rashes. onychomycosis of 2 fingernails Psych: Good eye contact, normal affect.  Memory intact, not anxious or depressed appearing    Impression & Recommendations:  Problem # 1:  GENERALIZED ANXIETY DISORDER (ICD-300.02) Assessment Improved  Her updated medication list for this problem includes:    Clonazepam 0.5 Mg Tabs (Clonazepam) ..... One half tablet once daily as needed for anxiety  Problem # 2:  ALLERGIC RHINITIS (ICD-477.9) Assessment: Unchanged  Her updated medication list for this problem includes:    Benadryl 25 Mg Caps (Diphenhydramine hcl) .Marland Kitchen... Take 1 tab by  mouth at bedtime  Problem # 3:  HYPERLIPIDEMIA (ICD-272.4) Assessment: Comment Only  Her updated medication list for this problem includes:    Lipitor 10 Mg Tabs (Atorvastatin calcium) ..... One tab by mouth qhs  Low fat dietdiscussed and encouraged  Labs Reviewed: SGOT: 21 (08/23/2010)   SGPT: 13 (08/23/2010)   HDL:61 (08/23/2010), 61 (03/30/2010)  LDL:88 (08/23/2010), 93 (03/30/2010)  Chol:179 (08/23/2010), 179 (03/30/2010)  Trig:151 (08/23/2010), 127  (03/30/2010)  Complete Medication List: 1)  Calcium Plus Vitamin D 600-100 Mg-unit Caps (Calcium carbonate-vitamin d) .... One cap by mouth two times a day 2)  Multivitamins Caps (Multiple vitamin) .... Take 1 tablet by mouth once a day 3)  Benadryl 25 Mg Caps (Diphenhydramine hcl) .... Take 1 tab by mouth at bedtime 4)  Aspirin 81 Mg Tbec (Aspirin) .... Take 1 tablet by mouth once a day 5)  Omeprazole 20 Mg Cpdr (Omeprazole) .... One cap by mouth qd 6)  Lipitor 10 Mg Tabs (Atorvastatin calcium) .... One tab by mouth qhs 7)  Clonazepam 0.5 Mg Tabs (Clonazepam) .... One half tablet once daily as needed for anxiety  Patient Instructions: 1)  cPE in  4 months. 2)  We will call next week with your blood results. 3)  You need to use vicks vapor rub on the  the fingernails theat are thickened once daily, for presumed fungal infection   Orders Added: 1)  Est. Patient Level IV [91478]

## 2011-01-10 LAB — URINALYSIS, ROUTINE W REFLEX MICROSCOPIC
Bilirubin Urine: NEGATIVE
Glucose, UA: NEGATIVE mg/dL
Hgb urine dipstick: NEGATIVE
Ketones, ur: NEGATIVE mg/dL
Protein, ur: NEGATIVE mg/dL
pH: 6.5 (ref 5.0–8.0)

## 2011-02-06 ENCOUNTER — Telehealth: Payer: Self-pay | Admitting: Family Medicine

## 2011-02-07 ENCOUNTER — Other Ambulatory Visit (INDEPENDENT_AMBULATORY_CARE_PROVIDER_SITE_OTHER): Payer: Medicare Other

## 2011-02-07 DIAGNOSIS — J302 Other seasonal allergic rhinitis: Secondary | ICD-10-CM

## 2011-02-07 DIAGNOSIS — J309 Allergic rhinitis, unspecified: Secondary | ICD-10-CM

## 2011-02-07 MED ORDER — LORATADINE 10 MG PO TABS: 10.0000 mg | ORAL_TABLET | Freq: Every day | ORAL | Status: DC

## 2011-02-07 NOTE — Telephone Encounter (Signed)
Yes claritin 10mg  one daily

## 2011-02-07 NOTE — Telephone Encounter (Signed)
I advised her it was OTC but she wants rx sent. Ok to send in for once a day?

## 2011-02-07 NOTE — Telephone Encounter (Signed)
Sent in

## 2011-03-06 NOTE — Assessment & Plan Note (Signed)
NAMEJHORDAN, KINTER                 CHART#:  [id number]   DATE:  05/01/2007                       DOB:  1926/12/03   Followup GERD, gas, bloat, history of hemoccult positive with negative  EGD and colonoscopy and small-bowel follow-through, last seen on  October 07, 2006.   Ms. Brundidge's anemia has resolved.  We had recommended Givens small bowel  capsule study but she declined the study.  Her followup H&H has come  back into the normal range.  Clinically she is doing well.  Only  occasional gas/bloat symptoms if she eats green things, specifically  broccoli and vegetables of that nature.  Reflux symptoms are well  controlled on omeprazole 20 mg orally daily.  She denies constipation or  diarrhea.  She has bowel movements just about every day.   CURRENT MEDICATIONS:  See updated list.   ALLERGIES:  1. CODEINE.  2. NASONEX.  3. GI COCKTAIL.   PHYSICAL EXAMINATION:  GENERAL:  She looks well.  VITAL SIGNS:  Weight 132 up 3.5 pounds, height 5 feet 3 inches, temp  98.4, BP 118/64, pulse 64.  SKIN:  Warm and dry.  ABDOMEN:  Nondistended.  Soft, nontender without appreciable mass,  organomegaly.   ASSESSMENT:  1. Gas/bloat symptoms, intermittent related to dietary intake.  2. Gastroesophageal reflux disease symptoms are well controlled on      omeprazole.   RECOMMENDATIONS:  1. Continue omeprazole.  2. [employer] gas/bloat literature provided      this patient information.  3. I have given her a 2-week supply of Align probiotic supplement, one      daily to see if that will help with gas/bloat symptoms.  4. Unless something comes up, we will plan to see Ms. Scott back on a      p.r.n. basis.  5. I have urged her to follow up with Dr. Lodema Hong as directed.       Jonathon Bellows, M.D.  Electronically Signed     RMR/MEDQ  D:  05/01/2007  T:  05/01/2007  Job:  [id number]   cc:   Milus Mallick. Lodema Hong, M.D.

## 2011-03-09 NOTE — Op Note (Signed)
NAME:  Kara [name], Kara [name]                ACCOUNT NO.:  [id number]   MEDICAL RECORD NO.:  [id number]          PATIENT TYPE:  AMB   LOCATION:  DAY                           FACILITY:  [facility]   PHYSICIAN:  R. Roetta Sessions, M.D. DATE OF BIRTH:  Sep 02, 1927   DATE OF PROCEDURE:  03/01/2006  DATE OF DISCHARGE:                                 OPERATIVE REPORT   PROCEDURE:  Diagnostic esophagogastroduodenoscopy followed by screening  colonoscopy.   INDICATION FOR PROCEDURE:  The patient is a 77 year old lady with GERD  symptoms refractory to omeprazole 20 mg orally daily.  She has no  odynophagia or dysphagia.  She is undergoing EGD and screening colonoscopy.  She had a sigmoidoscopy back in 1999, a contrast barium enema, and no  abnormalities were seen at that time.  She has no lower GI tract symptoms.  Colonoscopy is now being done as a screening maneuver.  The potential risks,  benefits, and alternatives have been reviewed.  Please see documentation in  the medical record.   PROCEDURE NOTE:  O2 saturation, blood pressure, pulse, and respiration were  monitored throughout the entirety of both procedures.   CONSCIOUS SEDATION:  Versed 4 mg IV, Demerol 100 mg IV in divided doses.   INSTRUMENT USED:  Olympus Pentax system.   FINDINGS:  EGD:  Examination of the tubular esophagus revealed a noncritical  Schatzki ring.  The esophageal mucosa otherwise appeared normal.  The EG  junction was easily traversed.   Stomach:  The gastric cavity was empty and insufflated well with air.  Examination of the gastric cavity including retroflexion in the proximal  stomach and esophagogastric junction demonstrated only a small hiatal  hernia.  The pylorus was patent and easily traversed.  Examination of the  bulb and second portion revealed no abnormalities.   Therapeutic/diagnostic maneuvers performed:  None.   The patient tolerated the procedure well and was prepared for her  colonoscopy.  Digital rectal  exam revealed no abnormalities.  Endoscopic  findings:  The prep was adequate.   Rectum:  Examination of the rectal mucosa including a retroflexed view of  the anal verge revealed no abnormalities.   Colon:  The colonic mucosa was surveyed from the rectosigmoid junction  through the left, transverse and right colon to the appendiceal orifice,  ileocecal valve and cecum.  These structures were well-seen and photographed  for the record.  From this level the scope was slowly withdrawn and all  previously-mentioned mucosal surfaces were again seen.  The patient had a  couple of shallow narrow-mouthed diverticula in the sigmoid colon.  Otherwise, the colonic mucosa appeared normal.  The colon was long and  redundant.  The patient tolerated both procedures well, was reacted in  endoscopy.   IMPRESSION:  1.  Normal esophagus aside from a noncritical Schatzki's ring and small      hiatal hernia, plus normal stomach, D1, D2.  2.  Colonoscopy findings:  Normal rectum, long redundant colon, shallow few      left-sided diverticula.   RECOMMENDATIONS:  1.  Stop omeprazole and begin Zegerid 40 mg  daily.  Prescription given.  She      is to go by my office to receive free samples.  2.  Repeat screening colonoscopy 10 years.  3.  A follow-up appointment with [place] in six weeks.      Jonathon Bellows, M.D.  Electronically Signed     RMR/MEDQ  D:  03/01/2006  T:  03/02/2006  Job:  [id number]   cc:   Milus Mallick. Lodema Hong, M.D.  Fax: [phone]
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
## 2011-03-15 ENCOUNTER — Telehealth: Payer: Self-pay | Admitting: Family Medicine

## 2011-03-15 NOTE — Telephone Encounter (Signed)
Patient called in and requested an appt to see why she is hurting in her right lower side.  I offered her an appt for 03/27/11 however she said that was too long to wait to come in.  Please advise, she would like to talk to the nurse.

## 2011-03-20 ENCOUNTER — Other Ambulatory Visit (HOSPITAL_COMMUNITY): Payer: Self-pay | Admitting: *Deleted

## 2011-03-20 DIAGNOSIS — R1031 Right lower quadrant pain: Secondary | ICD-10-CM

## 2011-03-21 ENCOUNTER — Other Ambulatory Visit: Payer: Self-pay | Admitting: Family Medicine

## 2011-03-21 ENCOUNTER — Ambulatory Visit (INDEPENDENT_AMBULATORY_CARE_PROVIDER_SITE_OTHER): Payer: Medicare Other | Admitting: Family Medicine

## 2011-03-21 ENCOUNTER — Encounter: Payer: Self-pay | Admitting: Family Medicine

## 2011-03-21 ENCOUNTER — Encounter: Payer: Self-pay | Admitting: Urgent Care

## 2011-03-21 VITALS — BP 130/70 | HR 74 | Resp 16 | Ht 64.0 in | Wt 123.1 lb

## 2011-03-21 DIAGNOSIS — K219 Gastro-esophageal reflux disease without esophagitis: Secondary | ICD-10-CM

## 2011-03-21 DIAGNOSIS — R933 Abnormal findings on diagnostic imaging of other parts of digestive tract: Secondary | ICD-10-CM

## 2011-03-21 DIAGNOSIS — M1990 Unspecified osteoarthritis, unspecified site: Secondary | ICD-10-CM

## 2011-03-21 DIAGNOSIS — R140 Abdominal distension (gaseous): Secondary | ICD-10-CM

## 2011-03-21 DIAGNOSIS — E785 Hyperlipidemia, unspecified: Secondary | ICD-10-CM

## 2011-03-21 DIAGNOSIS — R143 Flatulence: Secondary | ICD-10-CM

## 2011-03-21 DIAGNOSIS — J309 Allergic rhinitis, unspecified: Secondary | ICD-10-CM

## 2011-03-21 NOTE — Patient Instructions (Addendum)
F/u a before.  I agree with your abdominal and pelvic scans.  You need a test on your gallbladder function since you have abdominal bloating and nausea.   I am referring you for Dr Kendell Bane to re-evaluate your gI symptoms  [facility] and scan report.  Steroid injection  for arthritis is available , call if you decide on this once your tests are over.  Hope you feel better soon

## 2011-03-21 NOTE — Progress Notes (Signed)
  Subjective:    Patient ID: Kara [name], female    DOB: 11/03/1926, 77 y.o.   MRN: [id number]  HPI 3 week h/o intermittent lopwer abdominal pain, staes her apetite is poor due to excessive bloating and belching,Uncertain when last colonscopy was done, no change in bowel movements or blood. Reports increased joint pain and stiffness, generalized.   Review of Systems Denies recent fever or chills. Denies sinus pressure, , ear pain or sore throat.Does have nasal congestion with clear drainage Denies chest congestion, productive cough or wheezing. Denies chest pains, palpitations, paroxysmal nocturnal dyspnea, orthopnea and leg swelling Denies dysuria, frequency, hesitancy or incontinence.  Denies headaches, seizure, numbness, or tingling. Denies depression,she does have mild  anxiety and  insomnia. Denies skin break down or rash.        Objective:   Physical Exam Patient alert and oriented and in no Cardiopulmonary distress.  HEENT: No facial asymmetry, EOMI, no sinus tenderness, TM's clear, Oropharynx pink and moist.  Neck no adenopathy.  Chest: Clear to auscultation bilaterally.  CVS: S1, S2 no murmurs, no S3.  ABD: Soft mild epigastric tenderness, no guarding or rebound. Bowel sounds normal.No organomegaly or masses  Ext: No edema  MS: decreased  ROM spine, shoulders, hips and knees.  Skin: Intact, no ulcerations or rash noted.  Psych: Good eye contact, normal affect. Memory intact not anxious or depressed appearing.  CNS: CN 2-12 intact, power, tone and sensation normal throughout.        Assessment & Plan:

## 2011-03-21 NOTE — Telephone Encounter (Signed)
Mr. Kara [name] did not cancel and was not present for a scheduled appointment today.  Disposition: Called x 2 and no answer.

## 2011-03-21 NOTE — Telephone Encounter (Signed)
Patient in office today

## 2011-03-22 ENCOUNTER — Ambulatory Visit (HOSPITAL_COMMUNITY)
Admission: RE | Admit: 2011-03-22 | Discharge: 2011-03-22 | Disposition: A | Discharge status: Home / Self Care | Payer: Medicare Other | Source: Ambulatory Visit | Attending: Family Medicine | Admitting: Family Medicine

## 2011-03-22 DIAGNOSIS — R1031 Right lower quadrant pain: Secondary | ICD-10-CM

## 2011-03-22 DIAGNOSIS — N8111 Cystocele, midline: Secondary | ICD-10-CM | POA: Insufficient documentation

## 2011-03-22 DIAGNOSIS — R1903 Right lower quadrant abdominal swelling, mass and lump: Secondary | ICD-10-CM | POA: Insufficient documentation

## 2011-03-22 DIAGNOSIS — R933 Abnormal findings on diagnostic imaging of other parts of digestive tract: Secondary | ICD-10-CM | POA: Insufficient documentation

## 2011-03-23 ENCOUNTER — Other Ambulatory Visit (HOSPITAL_COMMUNITY): Payer: Medicare Other

## 2011-03-23 ENCOUNTER — Telehealth: Payer: Self-pay | Admitting: Family Medicine

## 2011-03-23 NOTE — Telephone Encounter (Signed)
Called patient, no answer 

## 2011-03-23 NOTE — Telephone Encounter (Signed)
Advise hiatal hernia, also in the colon , not sure if they are seeing stool or a mass, so they recommend a colonscopy if none recently, I suggest she make an appt to follow uo the scan and her symptoms with her GI doc, if she needs help with getting the appt , pls provide this for her

## 2011-03-26 ENCOUNTER — Ambulatory Visit (HOSPITAL_COMMUNITY)
Admission: RE | Admit: 2011-03-26 | Discharge: 2011-03-26 | Disposition: A | Discharge status: Home / Self Care | Payer: Medicare Other | Source: Ambulatory Visit | Attending: Family Medicine | Admitting: Family Medicine

## 2011-03-26 DIAGNOSIS — R109 Unspecified abdominal pain: Secondary | ICD-10-CM | POA: Insufficient documentation

## 2011-03-26 DIAGNOSIS — R140 Abdominal distension (gaseous): Secondary | ICD-10-CM

## 2011-03-26 NOTE — Telephone Encounter (Signed)
Called patient, no answer 

## 2011-03-27 ENCOUNTER — Other Ambulatory Visit (HOSPITAL_COMMUNITY): Payer: Self-pay | Admitting: Family Medicine

## 2011-03-27 ENCOUNTER — Ambulatory Visit (HOSPITAL_COMMUNITY): Payer: Medicare Other

## 2011-03-27 DIAGNOSIS — R1031 Right lower quadrant pain: Secondary | ICD-10-CM

## 2011-03-27 NOTE — Telephone Encounter (Signed)
Patient aware and has appt this week with GI doc

## 2011-03-29 ENCOUNTER — Ambulatory Visit (INDEPENDENT_AMBULATORY_CARE_PROVIDER_SITE_OTHER): Payer: Medicare Other | Admitting: Urgent Care

## 2011-03-29 ENCOUNTER — Encounter: Payer: Self-pay | Admitting: Urgent Care

## 2011-03-29 VITALS — BP 145/69 | HR 65 | Temp 97.8°F | Ht 63.0 in | Wt 123.4 lb

## 2011-03-29 DIAGNOSIS — R933 Abnormal findings on diagnostic imaging of other parts of digestive tract: Secondary | ICD-10-CM

## 2011-03-29 DIAGNOSIS — K5790 Diverticulosis of intestine, part unspecified, without perforation or abscess without bleeding: Secondary | ICD-10-CM | POA: Insufficient documentation

## 2011-03-29 DIAGNOSIS — K219 Gastro-esophageal reflux disease without esophagitis: Secondary | ICD-10-CM

## 2011-03-29 DIAGNOSIS — D649 Anemia, unspecified: Secondary | ICD-10-CM

## 2011-03-29 NOTE — Assessment & Plan Note (Signed)
Hx chronic anemia & heme positive stool.  Refused GIVENS capsule previously.  Normal SBFT, celiac, EGD & colonoscopy 2007.

## 2011-03-29 NOTE — Assessment & Plan Note (Addendum)
Kara [name] is a 77 y.o. female with recent abdominal pain & chronic anemia.   CT abd/pelvis w/ IV contrast shows soft tissue density in cecum.  Last colonoscopy over 5 years ago.  Will need repeat exam for further evaluation of this area.  I have discussed risks & benefits which include, but are not limited to, bleeding, infection, perforation & drug reaction.  The patient agrees with this plan & written consent will be obtained.

## 2011-03-29 NOTE — Assessment & Plan Note (Signed)
Well-controlled on PPI.  No upper GI symptoms.
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
## 2011-03-30 ENCOUNTER — Ambulatory Visit: Payer: Medicare Other | Admitting: Urgent Care

## 2011-03-30 NOTE — Progress Notes (Signed)
Cc to PCP 

## 2011-04-01 ENCOUNTER — Encounter: Payer: Self-pay | Admitting: Family Medicine

## 2011-04-01 NOTE — Assessment & Plan Note (Signed)
No med change counseled to reduce fat intake

## 2011-04-01 NOTE — Assessment & Plan Note (Signed)
gI referral for  further eval

## 2011-04-01 NOTE — Assessment & Plan Note (Signed)
In creased dyspepsia, needs HIDA scan

## 2011-04-01 NOTE — Assessment & Plan Note (Signed)
Controlled, no change in medication  

## 2011-04-03 ENCOUNTER — Ambulatory Visit (HOSPITAL_COMMUNITY)
Admission: RE | Admit: 2011-04-03 | Discharge: 2011-04-03 | Disposition: A | Discharge status: Home / Self Care | Payer: Medicare Other | Source: Ambulatory Visit | Attending: Internal Medicine | Admitting: Internal Medicine

## 2011-04-03 ENCOUNTER — Other Ambulatory Visit: Payer: Self-pay | Admitting: Internal Medicine

## 2011-04-03 ENCOUNTER — Encounter: Payer: Medicare Other | Admitting: Internal Medicine

## 2011-04-03 DIAGNOSIS — R933 Abnormal findings on diagnostic imaging of other parts of digestive tract: Secondary | ICD-10-CM | POA: Insufficient documentation

## 2011-04-03 DIAGNOSIS — K5730 Diverticulosis of large intestine without perforation or abscess without bleeding: Secondary | ICD-10-CM

## 2011-04-03 DIAGNOSIS — D126 Benign neoplasm of colon, unspecified: Secondary | ICD-10-CM | POA: Insufficient documentation

## 2011-04-03 DIAGNOSIS — R1031 Right lower quadrant pain: Secondary | ICD-10-CM | POA: Insufficient documentation

## 2011-04-03 DIAGNOSIS — Z7982 Long term (current) use of aspirin: Secondary | ICD-10-CM | POA: Insufficient documentation

## 2011-04-03 HISTORY — PX: OTHER SURGICAL HISTORY: SHX169

## 2011-04-04 DIAGNOSIS — D126 Benign neoplasm of colon, unspecified: Secondary | ICD-10-CM

## 2011-04-04 HISTORY — DX: Benign neoplasm of colon, unspecified: D12.6

## 2011-04-10 ENCOUNTER — Other Ambulatory Visit: Payer: Self-pay | Admitting: Family Medicine

## 2011-04-11 ENCOUNTER — Other Ambulatory Visit: Payer: Self-pay

## 2011-04-11 MED ORDER — CLONAZEPAM 0.5 MG PO TABS: ORAL_TABLET | ORAL | Status: DC

## 2011-04-16 ENCOUNTER — Telehealth: Payer: Self-pay | Admitting: Family Medicine

## 2011-04-16 ENCOUNTER — Encounter: Payer: Self-pay | Admitting: Internal Medicine

## 2011-04-16 ENCOUNTER — Telehealth: Payer: Self-pay

## 2011-04-16 ENCOUNTER — Other Ambulatory Visit: Payer: Self-pay | Admitting: *Deleted

## 2011-04-16 NOTE — Telephone Encounter (Signed)
Pt called and wants to know if RMR has looked at her CT report yet. She had it done at [facility]- her pcp ordered. I informed her that RMR was out of town and pt stated she could wait until he got back

## 2011-04-17 NOTE — Telephone Encounter (Signed)
This issue was addressed yesterday by me; if JL did not get the message , please let me know

## 2011-04-18 NOTE — Telephone Encounter (Signed)
I got this and have tried to call pt

## 2011-04-18 NOTE — Telephone Encounter (Signed)
Patient aware of ct and [place] results

## 2011-04-30 ENCOUNTER — Other Ambulatory Visit: Payer: Self-pay

## 2011-04-30 MED ORDER — CLONAZEPAM 0.5 MG PO TABS: ORAL_TABLET | ORAL | Status: DC

## 2011-05-07 ENCOUNTER — Encounter: Payer: Self-pay | Admitting: Family Medicine

## 2011-05-07 NOTE — Op Note (Signed)
  [name], [name]                ACCOUNT NO.:  [id number]  MEDICAL RECORD NO.:  [id number]  LOCATION:  [facility]                          FACILITY:  [facility]  PHYSICIAN:  R. Roetta Sessions, MD FACP FACGDATE OF BIRTH:  Apr 14, 1927  DATE OF PROCEDURE:  04/03/2011 DATE OF DISCHARGE:                              OPERATIVE REPORT   PROCEDURE:  Ileocolonoscopy with snare polypectomy.  INDICATIONS FOR PROCEDURE:  An 77 year old lady with recent vague right- sided abdominal pain, underwent CT up in [place], which implied a 3-cm soft tissue density within the cecum and sigmoid diverticulosis. Colonoscopy now being done.  Risks, benefits, limitations, alternatives, imponderables have been discussed, questions answered.  Please see the documentation in the medical record.  PROCEDURE NOTE:  O2 saturation, blood pressure, pulse, respirations were monitored throughout the entirety of procedure.  CONSCIOUS SEDATION:  Versed 3 mg IV, Demerol 75 mg IV in divided doses.  INSTRUMENT:  Pentax video chip system.  FINDINGS:  Digital rectal exam revealed no abnormalities.  Endoscopic findings:  Prep was adequate.  Colon:  Colonic mucosa surveyed from the rectosigmoid junction through the left transverse right colon to the appendiceal orifice, ileocecal valve/cecum.  These structures were well seen and photographed for the record.  We got a very good look at the cecum, appendiceal orifice, ileocecal valve.  I turned the patient from left side down to right side down to get additional good images, I intubated the terminal ileum to 10 cm.  This segment of GI tract appeared normal.  I did not see any evidence of neoplasm, submucosal impression, or other abnormality.  From this level, scope was slowly withdrawn.  All previously mentioned mucosal surfaces were again seen. On the way in, the patient was noted to have sigmoid diverticula.  There were two 5 mm polyps in the transverse colon which were cold  snared, recovered through the scope.  Remainder of the colonic mucosa appeared normal.  No other abnormality was observed on the way out.  Scope was pulled down into the rectum where thorough examination of rectal mucosa including retroflexed view of the anal verge demonstrated no abnormalities.  The patient tolerated the procedure well.  Cecal withdrawal time 10 minutes.  IMPRESSION: 1. Normal rectum. 2. Sigmoid diverticula and transverse colon polyp status post snare     polypectomy, normal cecum, ileocecal valve, terminal ileum, no     evidence of any soft tissue mass or other abnormality.  RECOMMENDATIONS: 1. Diverticulosis and polyp literature provided to Kara [name]. 2. Follow up on path. 3. I would like to get the CD of the CT scan done in [place] for     review with our radiologist here.  Further recommendations to     follow.     Jonathon Bellows, MD FACP [facility]     RMR/MEDQ  D:  04/03/2011  T:  04/04/2011  Job:  [id number]  cc:   Milus Mallick. Lodema Hong, M.D. Fax: [phone]  Electronically Signed by Lorrin Goodell M.D. on 05/07/2011 08:40:04 AM

## 2011-05-14 ENCOUNTER — Other Ambulatory Visit (HOSPITAL_COMMUNITY)
Admission: RE | Admit: 2011-05-14 | Discharge: 2011-05-14 | Disposition: A | Discharge status: Home / Self Care | Payer: Medicare Other | Source: Ambulatory Visit | Attending: Family Medicine | Admitting: Family Medicine

## 2011-05-14 ENCOUNTER — Encounter: Payer: Self-pay | Admitting: Family Medicine

## 2011-05-14 ENCOUNTER — Ambulatory Visit (INDEPENDENT_AMBULATORY_CARE_PROVIDER_SITE_OTHER): Payer: Medicare Other | Admitting: Family Medicine

## 2011-05-14 ENCOUNTER — Other Ambulatory Visit: Payer: Self-pay

## 2011-05-14 VITALS — BP 128/62 | HR 72 | Resp 16 | Ht 64.0 in | Wt 123.4 lb

## 2011-05-14 DIAGNOSIS — Z01419 Encounter for gynecological examination (general) (routine) without abnormal findings: Secondary | ICD-10-CM

## 2011-05-14 DIAGNOSIS — Z1211 Encounter for screening for malignant neoplasm of colon: Secondary | ICD-10-CM

## 2011-05-14 DIAGNOSIS — E785 Hyperlipidemia, unspecified: Secondary | ICD-10-CM

## 2011-05-14 DIAGNOSIS — Z124 Encounter for screening for malignant neoplasm of cervix: Secondary | ICD-10-CM | POA: Insufficient documentation

## 2011-05-14 DIAGNOSIS — Z0000 Encounter for general adult medical examination without abnormal findings: Secondary | ICD-10-CM

## 2011-05-14 LAB — POC HEMOCCULT BLD/STL (OFFICE/1-CARD/DIAGNOSTIC): Fecal Occult Blood, POC: NEGATIVE

## 2011-05-14 MED ORDER — ATORVASTATIN CALCIUM 10 MG PO TABS: ORAL_TABLET | ORAL | Status: DC

## 2011-05-14 MED ORDER — OMEPRAZOLE 20 MG PO CPDR: DELAYED_RELEASE_CAPSULE | ORAL | Status: DC

## 2011-05-14 NOTE — Patient Instructions (Signed)
F/u in mid October.  Fasting lipid and hepatic just before next oV  No med changes at this time.  Keep well

## 2011-05-16 ENCOUNTER — Encounter: Payer: Self-pay | Admitting: *Deleted

## 2011-05-18 ENCOUNTER — Encounter (HOSPITAL_COMMUNITY): Payer: Self-pay | Admitting: *Deleted

## 2011-05-18 ENCOUNTER — Emergency Department (HOSPITAL_COMMUNITY)
Admission: EM | Admit: 2011-05-18 | Discharge: 2011-05-18 | Disposition: A | Discharge status: Home / Self Care | Payer: Medicare Other | Attending: Emergency Medicine | Admitting: Emergency Medicine

## 2011-05-18 DIAGNOSIS — G8929 Other chronic pain: Secondary | ICD-10-CM | POA: Insufficient documentation

## 2011-05-18 DIAGNOSIS — E785 Hyperlipidemia, unspecified: Secondary | ICD-10-CM | POA: Insufficient documentation

## 2011-05-18 DIAGNOSIS — F411 Generalized anxiety disorder: Secondary | ICD-10-CM | POA: Insufficient documentation

## 2011-05-18 DIAGNOSIS — M810 Age-related osteoporosis without current pathological fracture: Secondary | ICD-10-CM | POA: Insufficient documentation

## 2011-05-18 DIAGNOSIS — R1013 Epigastric pain: Secondary | ICD-10-CM | POA: Insufficient documentation

## 2011-05-18 HISTORY — DX: Gastro-esophageal reflux disease without esophagitis: K21.9

## 2011-05-18 LAB — URINALYSIS, ROUTINE W REFLEX MICROSCOPIC
Bilirubin Urine: NEGATIVE
Glucose, UA: NEGATIVE mg/dL
Ketones, ur: NEGATIVE mg/dL
Protein, ur: NEGATIVE mg/dL
pH: 7 (ref 5.0–8.0)

## 2011-05-18 LAB — BASIC METABOLIC PANEL
BUN: 10 mg/dL (ref 6–23)
Calcium: 9.8 mg/dL (ref 8.4–10.5)
Chloride: 100 mEq/L (ref 96–112)
Creatinine, Ser: 0.52 mg/dL (ref 0.50–1.10)
GFR calc Af Amer: >60 mL/min (ref >60)

## 2011-05-18 LAB — CARDIAC PANEL(CRET KIN+CKTOT+MB+TROPI)
Relative Index: INVALID (ref 0.0–2.5)
Total CK: 32 U/L (ref 7–177)

## 2011-05-18 LAB — DIFFERENTIAL
Basophils Absolute: 0 10*3/uL (ref 0.0–0.1)
Basophils Relative: 0 % (ref 0–1)
Eosinophils Absolute: 0.1 10*3/uL (ref 0.0–0.7)
Eosinophils Relative: 1 % (ref 0–5)
Monocytes Absolute: 0.8 10*3/uL (ref 0.1–1.0)
Monocytes Relative: 8 % (ref 3–12)

## 2011-05-18 LAB — CBC
HCT: 33.2 % — ABNORMAL LOW (ref 36.0–46.0)
Hemoglobin: 11.2 g/dL — ABNORMAL LOW (ref 12.0–15.0)
MCH: 31.4 pg (ref 26.0–34.0)
MCHC: 33.7 g/dL (ref 30.0–36.0)
MCV: 93 fL (ref 78.0–100.0)
RDW: 14.2 % (ref 11.5–15.5)

## 2011-05-18 LAB — URINE MICROSCOPIC-ADD ON

## 2011-05-18 NOTE — ED Notes (Signed)
Pt reports increasing epigastric pain starting yesterday and worsening tonight, pt reports she has been taking tums without relief
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
## 2011-05-20 NOTE — Progress Notes (Signed)
  Subjective:    Patient ID: Kara [name], female    DOB: [birth year], 77 y.o.   MRN: [id number]  HPI The PT is here for annual exam and re-evaluation of chronic medical conditions, medication management and review of any available recent lab and radiology data.  Preventive health is updated, specifically  Cancer screening and Immunization.   Questions or concerns regarding consultations or procedures which the PT has had in the interim are  addressed. The PT denies any adverse reactions to current medications since the last visit.  Reports that she  Recently broke out in a pruritic rash on lower extremities for which she has been on prednisone and to which she is responding well, no current lesions  There are no specific complaints       Review of Systems Denies recent fever or chills. Denies sinus pressure, nasal congestion, ear pain or sore throat. Denies chest congestion, productive cough or wheezing. Denies chest pains, palpitations and leg swelling Denies abdominal pain, nausea, vomiting,diarrhea or constipation.   Denies dysuria, frequency, hesitancy or incontinence. Occasional  joint pain, and limitation in mobility.Denies any falls. Denies headaches, seizures, numbness, or tingling. Denies depression, anxiety or insomnia. Denies skin break down       Objective:   Physical Exam Pleasant well nourished female, alert and oriented x 3, in no cardio-pulmonary distress. Afebrile. HEENT No facial trauma or asymetry. Sinuses non tender.  EOMI, PERTL, fundoscopic exam is normal, no hemorhage or exudate.  External ears normal, tympanic membranes clear. Oropharynx moist, no exudate, poor dentition. Neck: decreased , no adenopathy,JVD or thyromegaly.No bruits.  Chest: Clear to ascultation bilaterally.No crackles or wheezes. Non tender to palpation  Breast: No asymetry,no masses. No nipple discharge or inversion. No axillary or supraclavicular adenopathy  Cardiovascular  system; Heart sounds normal,  S1 and  S2 ,no S3.  No murmur, or thrill. Apical beat not displaced Peripheral pulses normal.  Abdomen: Soft, non tender, no organomegaly or masses. No bruits. Bowel sounds normal. No guarding, tenderness or rebound.  Rectal:  No mass. Guaiac negative stool.  GU: External genitalia normal. No lesions. Vaginal canal normal.No discharge. Uterus atrophic, no adnexal masses, no cervical motion or adnexal tenderness.  Musculoskeletal exam: Decreased  ROM of spine, hips , shoulders and knees. No deformity ,swelling or crepitus noted. No muscle wasting or atrophy.   Neurologic: Cranial nerves 2 to 12 intact. Power, tone ,sensation and reflexes normal throughout. No disturbance in gait. No tremor.  Skin: Intact, no ulceration, erythema , scaling or rash noted. Pigmentation normal throughout  Psych; Normal mood and affect. Judgement and concentration normal. Memory intact        Assessment & Plan:   No problem-specific assessment & plan notes found for this encounter.

## 2011-06-04 ENCOUNTER — Encounter: Payer: Self-pay | Admitting: Urgent Care

## 2011-06-05 ENCOUNTER — Encounter: Payer: Self-pay | Admitting: Urgent Care

## 2011-06-05 ENCOUNTER — Ambulatory Visit (INDEPENDENT_AMBULATORY_CARE_PROVIDER_SITE_OTHER): Payer: Medicare Other | Admitting: Urgent Care

## 2011-06-05 DIAGNOSIS — K219 Gastro-esophageal reflux disease without esophagitis: Secondary | ICD-10-CM

## 2011-06-05 DIAGNOSIS — D126 Benign neoplasm of colon, unspecified: Secondary | ICD-10-CM

## 2011-06-05 NOTE — Patient Instructions (Signed)
Diet for GERD or PUD  Nutrition therapy can help ease the discomfort of gastroesophageal reflux disease (GERD) and peptic ulcer disease (PUD).    HOME CARE INSTRUCTIONS   Eat your meals slowly, in a relaxed setting.    Eat 5 to 6 small meals per day.    If a food causes distress, stop eating it for a period of time.   FOODS TO AVOID:   Coffee, regular or decaffeinated.    Cola beverages, regular or low calorie.      Tea, regular or decaffeinated.      Pepper.     Cocoa.     High fat foods including meats.     Butter, margarine, hydrogenated oil (trans fats).    Peppermint or spearmint (if you have GERD).      Fruits and vegetables as tolerated.      Alcoholic beverages.      Nicotine (smoking or chewing). This is one of the most potent stimulants to acid production in the gastrointestinal tract.      Any food that seems to aggravate your condition.      If you have questions regarding your diet, call your caregiver's office or a registered dietitian.  OTHER TIPS IF YOU HAVE GERD:   Lying flat may make symptoms worse. Keep the head of your bed raised 6 to 9 inches by using a foam wedge or blocks under the legs of the bed.    Do not lay down until 3 hours after eating a meal.    Daily physical activity may help reduce symptoms.   MAKE SURE YOU:     Understand these instructions.    Will watch your condition.    Will get help right away if you are not doing well or get worse.   Document Released: 10/08/2005 Document Re-Released: 02/24/2009  [employer] Patient Information 2011 [employer].
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
## 2011-06-06 ENCOUNTER — Encounter: Payer: Self-pay | Admitting: Urgent Care

## 2011-06-06 ENCOUNTER — Other Ambulatory Visit: Payer: Self-pay | Admitting: Family Medicine

## 2011-06-06 DIAGNOSIS — D126 Benign neoplasm of colon, unspecified: Secondary | ICD-10-CM | POA: Insufficient documentation

## 2011-06-06 NOTE — Assessment & Plan Note (Addendum)
Well-controlled on omeprazole 20 mg daily as long as she avoids trigger foods. Refill given GERD diet literature given

## 2011-06-06 NOTE — Progress Notes (Signed)
Cc to PCP 

## 2011-08-01 ENCOUNTER — Other Ambulatory Visit: Payer: Self-pay | Admitting: Family Medicine

## 2011-08-07 LAB — STREP A DNA PROBE: Group A Strep Probe: NEGATIVE

## 2011-08-07 LAB — RAPID STREP SCREEN (MED CTR MEBANE ONLY): Streptococcus, Group A Screen (Direct): NEGATIVE

## 2011-08-14 ENCOUNTER — Ambulatory Visit (INDEPENDENT_AMBULATORY_CARE_PROVIDER_SITE_OTHER): Payer: Medicare Other

## 2011-08-14 DIAGNOSIS — Z23 Encounter for immunization: Secondary | ICD-10-CM

## 2011-08-15 LAB — HEPATIC FUNCTION PANEL
AST: 24 U/L (ref 0–37)
Albumin: 4.3 g/dL (ref 3.5–5.2)
Bilirubin, Direct: 0.1 mg/dL (ref 0.0–0.3)
Total Bilirubin: 0.3 mg/dL (ref 0.3–1.2)

## 2011-08-15 LAB — LIPID PANEL
HDL: 60 mg/dL (ref >39)
LDL Cholesterol: 89 mg/dL (ref 0–99)
Total CHOL/HDL Ratio: 2.9 Ratio
VLDL: 27 mg/dL (ref 0–40)

## 2011-08-17 ENCOUNTER — Encounter: Payer: Self-pay | Admitting: Family Medicine

## 2011-08-21 ENCOUNTER — Ambulatory Visit (INDEPENDENT_AMBULATORY_CARE_PROVIDER_SITE_OTHER): Payer: Medicare Other | Admitting: Family Medicine

## 2011-08-21 ENCOUNTER — Encounter: Payer: Self-pay | Admitting: Family Medicine

## 2011-08-21 VITALS — BP 128/64 | HR 64 | Resp 16 | Ht 64.0 in | Wt 124.4 lb

## 2011-08-21 DIAGNOSIS — E785 Hyperlipidemia, unspecified: Secondary | ICD-10-CM

## 2011-08-21 DIAGNOSIS — K219 Gastro-esophageal reflux disease without esophagitis: Secondary | ICD-10-CM

## 2011-08-21 DIAGNOSIS — M1990 Unspecified osteoarthritis, unspecified site: Secondary | ICD-10-CM

## 2011-08-21 DIAGNOSIS — F411 Generalized anxiety disorder: Secondary | ICD-10-CM

## 2011-08-21 DIAGNOSIS — M129 Arthropathy, unspecified: Secondary | ICD-10-CM

## 2011-08-21 NOTE — Progress Notes (Signed)
  Subjective:    Patient ID: Kara [name], female    DOB: 06-Oct-1927, 77 y.o.   MRN: [id number]  HPI Intermittent RLQ dull aching x 6 months. Also c/o increased knee pain, but denies falls or instability. Denies recent fever or chills, her chronic allergic rhinitis is stable  Review of Systems See HPI Denies recent fever or chills. Denies sinus pressure, nasal congestion, ear pain or sore throat. Denies chest congestion, productive cough or wheezing. Denies chest pains, palpitations and leg swelling Denies abdominal pain, nausea, vomiting,diarrhea or constipation.   Denies dysuria, frequency, hesitancy or incontinence.  Denies headaches, seizures, numbness, or tingling. Denies depression, anxiety or insomnia. Denies skin break down or rash.        Objective:   Physical Exam  Patient alert and oriented and in no cardiopulmonary distress.  HEENT: No facial asymmetry, EOMI, no sinus tenderness,  oropharynx pink and moist.  Neck supple no adenopathy.  Chest: Clear to auscultation bilaterally.  CVS: S1, S2 no murmurs, no S3.  ABD: Soft non tender. Bowel sounds normal.  Ext: No edema  MS: decreased  ROM spine, shoulders, hips and knees.  Skin: Intact, no ulcerations or rash noted.  Psych: Good eye contact, normal affect. Memory intact not anxious or depressed appearing.  CNS: CN 2-12 intact, power, tone and sensation normal throughout.       Assessment & Plan:

## 2011-08-21 NOTE — Patient Instructions (Signed)
F/U in 5 months.  No tests needed now, except mammogram which we will schedule.  Use tylenol for arthritis pain.  Recent labs are excellent.  Pls call your insurance company about the shingles vaccine, we will be happy to give it you

## 2011-08-26 DIAGNOSIS — M1990 Unspecified osteoarthritis, unspecified site: Secondary | ICD-10-CM | POA: Insufficient documentation

## 2011-08-26 NOTE — Assessment & Plan Note (Signed)
uncontrolle d with new c/o knee pain, denies falls not interested in injection or ortho eval

## 2011-08-26 NOTE — Assessment & Plan Note (Signed)
Controlled, no change in medication  

## 2011-08-26 NOTE — Assessment & Plan Note (Signed)
Stable

## 2011-09-12 ENCOUNTER — Other Ambulatory Visit: Payer: Self-pay

## 2011-09-12 MED ORDER — CLONAZEPAM 0.5 MG PO TABS: ORAL_TABLET | ORAL | Status: DC

## 2011-09-17 ENCOUNTER — Telehealth: Payer: Self-pay | Admitting: Family Medicine

## 2011-09-17 NOTE — Telephone Encounter (Signed)
Patient is aware 

## 2011-09-20 ENCOUNTER — Emergency Department (HOSPITAL_COMMUNITY): Payer: Medicare Other

## 2011-09-20 ENCOUNTER — Encounter (HOSPITAL_COMMUNITY): Payer: Self-pay | Admitting: *Deleted

## 2011-09-20 ENCOUNTER — Emergency Department (HOSPITAL_COMMUNITY)
Admission: EM | Admit: 2011-09-20 | Discharge: 2011-09-20 | Disposition: A | Discharge status: Home / Self Care | Payer: Medicare Other | Attending: Emergency Medicine | Admitting: Emergency Medicine

## 2011-09-20 ENCOUNTER — Telehealth: Payer: Self-pay | Admitting: Family Medicine

## 2011-09-20 DIAGNOSIS — M810 Age-related osteoporosis without current pathological fracture: Secondary | ICD-10-CM | POA: Insufficient documentation

## 2011-09-20 DIAGNOSIS — M1990 Unspecified osteoarthritis, unspecified site: Secondary | ICD-10-CM | POA: Insufficient documentation

## 2011-09-20 DIAGNOSIS — M545 Low back pain, unspecified: Secondary | ICD-10-CM | POA: Insufficient documentation

## 2011-09-20 DIAGNOSIS — N949 Unspecified condition associated with female genital organs and menstrual cycle: Secondary | ICD-10-CM | POA: Insufficient documentation

## 2011-09-20 DIAGNOSIS — M542 Cervicalgia: Secondary | ICD-10-CM | POA: Insufficient documentation

## 2011-09-20 DIAGNOSIS — K219 Gastro-esophageal reflux disease without esophagitis: Secondary | ICD-10-CM | POA: Insufficient documentation

## 2011-09-20 DIAGNOSIS — G8929 Other chronic pain: Secondary | ICD-10-CM | POA: Insufficient documentation

## 2011-09-20 DIAGNOSIS — M25559 Pain in unspecified hip: Secondary | ICD-10-CM | POA: Insufficient documentation

## 2011-09-20 DIAGNOSIS — E785 Hyperlipidemia, unspecified: Secondary | ICD-10-CM | POA: Insufficient documentation

## 2011-09-20 LAB — DIFFERENTIAL
Lymphocytes Relative: 41 % (ref 12–46)
Lymphs Abs: 3.1 10*3/uL (ref 0.7–4.0)
Monocytes Relative: 9 % (ref 3–12)
Neutro Abs: 3.7 10*3/uL (ref 1.7–7.7)
Neutrophils Relative %: 48 % (ref 43–77)

## 2011-09-20 LAB — URINALYSIS, ROUTINE W REFLEX MICROSCOPIC
Glucose, UA: NEGATIVE mg/dL
Leukocytes, UA: NEGATIVE
Nitrite: NEGATIVE
Specific Gravity, Urine: 1.01 (ref 1.005–1.030)
pH: 6 (ref 5.0–8.0)

## 2011-09-20 LAB — COMPREHENSIVE METABOLIC PANEL
Albumin: 3.7 g/dL (ref 3.5–5.2)
Alkaline Phosphatase: 61 U/L (ref 39–117)
BUN: 8 mg/dL (ref 6–23)
CO2: 30 mEq/L (ref 19–32)
Chloride: 103 mEq/L (ref 96–112)
GFR calc Af Amer: >90 mL/min (ref >90)
GFR calc non Af Amer: 82 mL/min — ABNORMAL LOW (ref >90)
Glucose, Bld: 99 mg/dL (ref 70–99)
Potassium: 3.8 mEq/L (ref 3.5–5.1)
Total Bilirubin: 0.2 mg/dL — ABNORMAL LOW (ref 0.3–1.2)

## 2011-09-20 LAB — CBC
HCT: 34.3 % — ABNORMAL LOW (ref 36.0–46.0)
Hemoglobin: 11.3 g/dL — ABNORMAL LOW (ref 12.0–15.0)
RBC: 3.64 MIL/uL — ABNORMAL LOW (ref 3.87–5.11)

## 2011-09-20 MED ORDER — OXYCODONE-ACETAMINOPHEN 5-325 MG PO TABS: 1.0000 | ORAL_TABLET | ORAL | Status: AC | PRN

## 2011-09-20 MED ORDER — SODIUM CHLORIDE 0.9 % IJ SOLN
3.0000 mL | INTRAMUSCULAR | Status: DC | PRN
  Administered 2011-09-20: 3 mL via INTRAVENOUS
  Filled 2011-09-20: qty 3

## 2011-09-20 MED ORDER — SODIUM CHLORIDE 0.9 % IV SOLN
Freq: Once | INTRAVENOUS | Status: AC
  Administered 2011-09-20: 1000 mL via INTRAVENOUS

## 2011-09-20 MED ORDER — FENTANYL CITRATE 0.05 MG/ML IJ SOLN
50.0000 ug | Freq: Once | INTRAMUSCULAR | Status: AC
  Administered 2011-09-20: 50 ug via INTRAVENOUS
  Filled 2011-09-20: qty 2

## 2011-09-20 NOTE — ED Notes (Signed)
Pt c/o pain in her lower back and pressure in her bladder area. States that she couldn't make it to the bathroom today. Denies injury.
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
## 2011-09-21 NOTE — Telephone Encounter (Signed)
Pt has a follow up appt with dr. Lodema Hong for 10/01/2011. She went to ER. Pt is aware of this appt

## 2011-09-21 NOTE — Telephone Encounter (Signed)
Called patient no answer. Please schedule first available appt for her

## 2011-09-26 ENCOUNTER — Encounter: Payer: Self-pay | Admitting: Family Medicine

## 2011-10-01 ENCOUNTER — Ambulatory Visit (INDEPENDENT_AMBULATORY_CARE_PROVIDER_SITE_OTHER): Payer: Medicare Other | Admitting: Family Medicine

## 2011-10-01 ENCOUNTER — Encounter: Payer: Self-pay | Admitting: Family Medicine

## 2011-10-01 VITALS — BP 144/72 | HR 78 | Resp 16 | Ht 64.0 in | Wt 123.8 lb

## 2011-10-01 DIAGNOSIS — M549 Dorsalgia, unspecified: Secondary | ICD-10-CM | POA: Insufficient documentation

## 2011-10-01 DIAGNOSIS — K219 Gastro-esophageal reflux disease without esophagitis: Secondary | ICD-10-CM

## 2011-10-01 MED ORDER — PREDNISONE (PAK) 5 MG PO TABS: 5.0000 mg | ORAL_TABLET | ORAL | Status: DC

## 2011-10-01 MED ORDER — MELOXICAM 7.5 MG PO TABS: 7.5000 mg | ORAL_TABLET | Freq: Every day | ORAL | Status: DC

## 2011-10-01 MED ORDER — KETOROLAC TROMETHAMINE 60 MG/2ML IJ SOLN
30.0000 mg | Freq: Once | INTRAMUSCULAR | Status: AC
  Administered 2011-10-01: 30 mg via INTRAMUSCULAR

## 2011-10-01 MED ORDER — METHYLPREDNISOLONE ACETATE 40 MG/ML IJ SUSP
40.0000 mg | Freq: Once | INTRAMUSCULAR | Status: AC
  Administered 2011-10-01: 40 mg via INTRAMUSCULAR

## 2011-10-01 NOTE — Progress Notes (Signed)
  Subjective:    Patient ID: Kara [name], female    DOB: Sep 23, 1927, 77 y.o.   MRN: [id number]  HPI Acute low back pain and left hip pain  Since 11/29,  seen in the Ed, wants help and a cane, believes symptoms were aggravated by raking  Denies lower ext weakness, numbness or incontinence   Review of Systems See HPI Denies recent fever or chills. Denies sinus pressure, nasal congestion, ear pain or sore throat. Denies chest congestion, productive cough or wheezing.        Objective:   Physical Exam Patient alert and oriented and in no cardiopulmonary distress.Pt in pain  HEENT: No facial asymmetry, EOMI, no sinus tenderness,  oropharynx pink and moist.  Neck supple no adenopathy.  Chest: Clear to auscultation bilaterally.  CVS: S1, S2 no murmurs, no S3.  Ext: No edema  MS: decreased ROM spine,and right  hips , limping  Skin: Intact, no ulcerations or rash noted.  Psych: Good eye contact, normal affect. Memory intact not anxious or depressed appearing.  CNS: CN 2-12 intact, power, tone and sensation normal throughout.        Assessment & Plan:

## 2011-10-01 NOTE — Patient Instructions (Signed)
F/u as before.  You are being treated for acute back pain with sciatica. Injection of depo medrol 40mg  and toradol 30 mg IM administered in office. Prednisone and meloxicam tablets are prescribed START TOMORROW  Be careful about falling, a script is provided for a cane.  Call bey Thursday if not much better

## 2011-10-01 NOTE — Assessment & Plan Note (Signed)
Uncontrolled,and requestiong cane

## 2011-10-03 ENCOUNTER — Telehealth: Payer: Self-pay | Admitting: Family Medicine

## 2011-10-03 NOTE — Telephone Encounter (Signed)
Since so scared do not take, use the prednisone ALONE  And see how she does

## 2011-10-03 NOTE — Telephone Encounter (Signed)
States she is scared of the meloxicam because it has too many risks. [place] Spaniel it was increase the risk of heart attack and stroke and stomach problems and she already has history of all that in her family. Is scared to taje it but if you think the prednisone will work then she needs nothing else sent in. Wanted to know if it was ok to not take the med?

## 2011-10-03 NOTE — Telephone Encounter (Signed)
Called pt with no answer. No answering machine available.

## 2011-10-03 NOTE — Telephone Encounter (Signed)
Spoke with pt and informed her to continue the prednisone as ordered.

## 2011-10-03 NOTE — Assessment & Plan Note (Signed)
Controlled, no change in medication  

## 2011-10-29 ENCOUNTER — Telehealth: Payer: Self-pay | Admitting: Family Medicine

## 2011-10-31 ENCOUNTER — Encounter: Payer: Self-pay | Admitting: Family Medicine

## 2011-10-31 NOTE — Telephone Encounter (Signed)
Has ov in am

## 2011-10-31 NOTE — Telephone Encounter (Signed)
Pt called and wants tordal shot. Just received one on 12/10.  Please advise.

## 2011-11-01 ENCOUNTER — Other Ambulatory Visit: Payer: Self-pay | Admitting: Family Medicine

## 2011-11-01 ENCOUNTER — Ambulatory Visit (INDEPENDENT_AMBULATORY_CARE_PROVIDER_SITE_OTHER): Payer: Medicare Other | Admitting: Family Medicine

## 2011-11-01 ENCOUNTER — Encounter: Payer: Self-pay | Admitting: Family Medicine

## 2011-11-01 VITALS — BP 124/72 | HR 73 | Resp 18 | Ht 64.0 in | Wt 123.1 lb

## 2011-11-01 DIAGNOSIS — M549 Dorsalgia, unspecified: Secondary | ICD-10-CM | POA: Diagnosis not present

## 2011-11-01 DIAGNOSIS — J309 Allergic rhinitis, unspecified: Secondary | ICD-10-CM | POA: Diagnosis not present

## 2011-11-01 DIAGNOSIS — E785 Hyperlipidemia, unspecified: Secondary | ICD-10-CM | POA: Diagnosis not present

## 2011-11-01 NOTE — Progress Notes (Signed)
  Subjective:    Patient ID: Kara [name], female    DOB: 1927/06/26, 77 y.o.   MRN: [id number]  HPI Continued severe low back pain radiaiting down the left hip, around to groin also down to left foot causing stinging pain  in the left foot. Has had some relief witht the treatment because initially both feet were hurting . Still reports uncontrolled disabling ;pain and some lower extremity weakness and instability. No other significant complaints.   Review of Systems See HPI Denies recent fever or chills. Denies sinus pressure, nasal congestion, ear pain or sore throat. Denies chest congestion, productive cough or wheezing. Allergies fairly stable and controlled currently, no excessive drainage       Objective:   Physical Exam Patient alert and oriented and in no cardiopulmonary distress.  HEENT: No facial asymmetry, EOMI, no sinus tenderness,  oropharynx pink and moist.  Neck supple no adenopathy.  Chest: Clear to auscultation bilaterally.  CVS: S1, S2 no murmurs, no S3.  ABD: Soft non tender.  Ext: No edema  MS: decreased  ROM spine, and left hip .  Skin: Intact, no ulcerations or rash noted.   CNS: CN 2-12 intact,decreased  power and sensation in left lower extremity,        Assessment & Plan:

## 2011-11-01 NOTE — Patient Instructions (Signed)
F/u in 6 to 8 weeks  You are being referred to physical therapy at [facility] for back pain.  You are referred for [facility] of your spine to find out if you have pinched nerves in your spine.  Please reschedule your mammogram

## 2011-11-06 ENCOUNTER — Ambulatory Visit (HOSPITAL_COMMUNITY): Payer: Medicare Other

## 2011-11-06 ENCOUNTER — Other Ambulatory Visit (HOSPITAL_COMMUNITY): Payer: Medicare Other

## 2011-11-11 NOTE — Assessment & Plan Note (Signed)
Deteriorated despite conservative treatment, reports left lower extremity symptoms and uncontrolled pain.will refer for PT as well as an MRI of LS spine, I believe she has nerve compression

## 2011-11-11 NOTE — Assessment & Plan Note (Signed)
Controlled, no change in medication  

## 2011-11-11 NOTE — Assessment & Plan Note (Signed)
Controlled, uses otc benadryl as needed

## 2011-11-28 ENCOUNTER — Other Ambulatory Visit: Payer: Self-pay | Admitting: Family Medicine

## 2011-12-27 ENCOUNTER — Encounter: Payer: Self-pay | Admitting: Family Medicine

## 2011-12-27 ENCOUNTER — Ambulatory Visit: Payer: Medicare Other | Admitting: Family Medicine

## 2012-02-18 ENCOUNTER — Encounter: Payer: Self-pay | Admitting: Family Medicine

## 2012-02-18 ENCOUNTER — Ambulatory Visit (INDEPENDENT_AMBULATORY_CARE_PROVIDER_SITE_OTHER): Payer: Medicare Other | Admitting: Family Medicine

## 2012-02-18 VITALS — BP 126/58 | HR 71 | Resp 18 | Ht 64.0 in | Wt 126.0 lb

## 2012-02-18 DIAGNOSIS — M129 Arthropathy, unspecified: Secondary | ICD-10-CM | POA: Diagnosis not present

## 2012-02-18 DIAGNOSIS — J0100 Acute maxillary sinusitis, unspecified: Secondary | ICD-10-CM

## 2012-02-18 DIAGNOSIS — M1990 Unspecified osteoarthritis, unspecified site: Secondary | ICD-10-CM

## 2012-02-18 DIAGNOSIS — E785 Hyperlipidemia, unspecified: Secondary | ICD-10-CM | POA: Diagnosis not present

## 2012-02-18 DIAGNOSIS — J309 Allergic rhinitis, unspecified: Secondary | ICD-10-CM

## 2012-02-18 DIAGNOSIS — K219 Gastro-esophageal reflux disease without esophagitis: Secondary | ICD-10-CM

## 2012-02-18 MED ORDER — DICLOFENAC SODIUM 1 % TD GEL: TRANSDERMAL | Status: DC

## 2012-02-18 MED ORDER — AZITHROMYCIN 250 MG PO TABS: ORAL_TABLET | ORAL | Status: AC

## 2012-02-18 NOTE — Assessment & Plan Note (Signed)
Acute symptoms, antibiotic prescribed 

## 2012-02-18 NOTE — Patient Instructions (Addendum)
F/u in 4.5 month with rectal exam  Please call if you need me before  A mammogram will be scheduled at [facility] per your request.  I agree with dermatology evaluation of chest lesion.  Medication is sent in for sinusitis and for arthritis pain  You need the shingles vaccine, check with your pharmacy or the insurance about coverage, We do have this

## 2012-02-18 NOTE — Assessment & Plan Note (Signed)
Controlled, no change in medication  

## 2012-02-18 NOTE — Assessment & Plan Note (Signed)
Increased joint pain and stiffness, add topical anti inflammatory

## 2012-02-18 NOTE — Assessment & Plan Note (Signed)
Currently experiencing increased symptoms

## 2012-02-18 NOTE — Progress Notes (Signed)
  Subjective:    Patient ID: Kara [name], female    DOB: [birth year], 78 y.o.   MRN: [id number]  HPI The PT is here for follow up and re-evaluation of chronic medical conditions, medication management and review of any available recent lab and radiology data.  Preventive health is updated, specifically  Cancer screening and Immunization.   Questions or concerns regarding consultations or procedures which the PT has had in the interim are  addressed. The PT denies any adverse reactions to current medications since the last visit.  3 day h/o increased pressure over left cheek radiating to left ear with increased nasal drainage and congestion . No sore throat , fever or cough. C/o increased generalized joint pains, has not been taking mobic and would like to try a topical rub      Review of Systems See HPI Denies recent fever , has had chills.  Denies chest congestion, productive cough or wheezing. Denies chest pains, palpitations and leg swelling Denies abdominal pain, nausea, vomiting,diarrhea or constipation.   Denies dysuria, frequency, hesitancy or incontinence. Denies headaches, seizures, numbness, or tingling. Denies depression, anxiety or insomnia. Denies skin break down or rash.        Objective:   Physical Exam Patient alert and oriented and in no cardiopulmonary distress.  HEENT: No facial asymmetry, EOMI, left maxillary  sinus tenderness,  oropharynx pink and moist.  Neck decreased ROM no adenopathy.  Chest: Clear to auscultation bilaterally.  CVS: S1, S2 no murmurs, no S3.  ABD: Soft non tender. Bowel sounds normal.  Ext: No edema  WU:[id number]  ROM spine, shoulders, hips and knees.  Skin: Intact, no ulcerations or rash noted.  Psych: Good eye contact, normal affect. Memory intact not anxious or depressed appearing.  CNS: CN 2-12 intact, power, tone and sensation normal throughout.        Assessment & Plan:

## 2012-02-18 NOTE — Assessment & Plan Note (Signed)
Controlled, no change in medication Hyperlipidemia:Low fat diet discussed and encouraged.  \ 

## 2012-02-19 LAB — LIPID PANEL
HDL: 56 mg/dL (ref >39)
LDL Cholesterol: 102 mg/dL — ABNORMAL HIGH (ref 0–99)
Total CHOL/HDL Ratio: 3.4 Ratio

## 2012-02-19 LAB — HEPATIC FUNCTION PANEL
Albumin: 4.5 g/dL (ref 3.5–5.2)
Alkaline Phosphatase: 51 U/L (ref 39–117)
Bilirubin, Direct: 0.1 mg/dL (ref 0.0–0.3)
Total Bilirubin: 0.4 mg/dL (ref 0.3–1.2)

## 2012-02-21 DIAGNOSIS — D235 Other benign neoplasm of skin of trunk: Secondary | ICD-10-CM | POA: Diagnosis not present

## 2012-02-21 DIAGNOSIS — L820 Inflamed seborrheic keratosis: Secondary | ICD-10-CM | POA: Diagnosis not present

## 2012-03-03 DIAGNOSIS — M25539 Pain in unspecified wrist: Secondary | ICD-10-CM | POA: Diagnosis not present

## 2012-03-06 ENCOUNTER — Encounter (HOSPITAL_COMMUNITY): Payer: Self-pay | Admitting: *Deleted

## 2012-03-06 ENCOUNTER — Emergency Department (HOSPITAL_COMMUNITY)
Admission: EM | Admit: 2012-03-06 | Discharge: 2012-03-06 | Disposition: A | Discharge status: Home / Self Care | Payer: Medicare Other | Attending: Emergency Medicine | Admitting: Emergency Medicine

## 2012-03-06 ENCOUNTER — Emergency Department (HOSPITAL_COMMUNITY): Payer: Medicare Other

## 2012-03-06 DIAGNOSIS — M79609 Pain in unspecified limb: Secondary | ICD-10-CM | POA: Diagnosis not present

## 2012-03-06 DIAGNOSIS — M810 Age-related osteoporosis without current pathological fracture: Secondary | ICD-10-CM | POA: Insufficient documentation

## 2012-03-06 DIAGNOSIS — M7989 Other specified soft tissue disorders: Secondary | ICD-10-CM | POA: Insufficient documentation

## 2012-03-06 DIAGNOSIS — M19039 Primary osteoarthritis, unspecified wrist: Secondary | ICD-10-CM | POA: Diagnosis not present

## 2012-03-06 DIAGNOSIS — M13139 Monoarthritis, not elsewhere classified, unspecified wrist: Secondary | ICD-10-CM | POA: Diagnosis not present

## 2012-03-06 DIAGNOSIS — M65839 Other synovitis and tenosynovitis, unspecified forearm: Secondary | ICD-10-CM | POA: Diagnosis not present

## 2012-03-06 DIAGNOSIS — M25539 Pain in unspecified wrist: Secondary | ICD-10-CM | POA: Insufficient documentation

## 2012-03-06 DIAGNOSIS — F17200 Nicotine dependence, unspecified, uncomplicated: Secondary | ICD-10-CM | POA: Diagnosis not present

## 2012-03-06 DIAGNOSIS — M65849 Other synovitis and tenosynovitis, unspecified hand: Secondary | ICD-10-CM | POA: Diagnosis not present

## 2012-03-06 DIAGNOSIS — E785 Hyperlipidemia, unspecified: Secondary | ICD-10-CM | POA: Diagnosis not present

## 2012-03-06 DIAGNOSIS — K219 Gastro-esophageal reflux disease without esophagitis: Secondary | ICD-10-CM | POA: Insufficient documentation

## 2012-03-06 DIAGNOSIS — M779 Enthesopathy, unspecified: Secondary | ICD-10-CM

## 2012-03-06 MED ORDER — ONDANSETRON HCL 4 MG PO TABS
4.0000 mg | ORAL_TABLET | Freq: Once | ORAL | Status: AC
  Administered 2012-03-06: 4 mg via ORAL
  Filled 2012-03-06: qty 1

## 2012-03-06 MED ORDER — HYDROCODONE-ACETAMINOPHEN 5-325 MG PO TABS: ORAL_TABLET | ORAL | Status: DC

## 2012-03-06 MED ORDER — HYDROCODONE-ACETAMINOPHEN 5-325 MG PO TABS
1.0000 | ORAL_TABLET | Freq: Once | ORAL | Status: AC
  Administered 2012-03-06: 1 via ORAL
  Filled 2012-03-06: qty 1
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
## 2012-03-06 NOTE — ED Notes (Signed)
Right hand pain x 4 days.  Seen at [facility] x 3 days ago, x-rays were negative.  Still c/o pain, denies injury.

## 2012-03-06 NOTE — Discharge Instructions (Signed)
Your xrays reveal advanced arthritis of the wrist. Arthritis, Nonspecific Arthritis is inflammation of a joint. This usually means pain, redness, warmth or swelling are present. One or more joints may be involved. There are a number of types of arthritis. Your caregiver may not be able to tell what type of arthritis you have right away. CAUSES  The most common cause of arthritis is the wear and tear on the joint (osteoarthritis). This causes damage to the cartilage, which can break down over time. The knees, hips, back and neck are most often affected by this type of arthritis. Other types of arthritis and common causes of joint pain include:  Sprains and other injuries near the joint. Sometimes minor sprains and injuries cause pain and swelling that develop hours later.   Rheumatoid arthritis. This affects hands, feet and knees. It usually affects both sides of your body at the same time. It is often associated with chronic ailments, fever, weight loss and general weakness.   Crystal arthritis. Gout and pseudo gout can cause occasional acute severe pain, redness and swelling in the foot, ankle, or knee.   Infectious arthritis. Bacteria can get into a joint through a break in overlying skin. This can cause infection of the joint. Bacteria and viruses can also spread through the blood and affect your joints.   Drug, infectious and allergy reactions. Sometimes joints can become mildly painful and slightly swollen with these types of illnesses.  SYMPTOMS   Pain is the main symptom.   Your joint or joints can also be red, swollen and warm or hot to the touch.   You may have a fever with certain types of arthritis, or even feel overall ill.   The joint with arthritis will hurt with movement. Stiffness is present with some types of arthritis.  DIAGNOSIS  Your caregiver will suspect arthritis based on your description of your symptoms and on your exam. Testing may be needed to find the type of  arthritis:  Blood and sometimes urine tests.   X-ray tests and sometimes CT or MRI scans.   Removal of fluid from the joint (arthrocentesis) is done to check for bacteria, crystals or other causes. Your caregiver (or a specialist) will numb the area over the joint with a local anesthetic, and use a needle to remove joint fluid for examination. This procedure is only minimally uncomfortable.   Even with these tests, your caregiver may not be able to tell what kind of arthritis you have. Consultation with a specialist (rheumatologist) may be helpful.  TREATMENT  Your caregiver will discuss with you treatment specific to your type of arthritis. If the specific type cannot be determined, then the following general recommendations may apply. Treatment of severe joint pain includes:  Rest.   Elevation.   Anti-inflammatory medication (for example, ibuprofen) may be prescribed. Avoiding activities that cause increased pain.   Only take over-the-counter or prescription medicines for pain and discomfort as recommended by your caregiver.   Cold packs over an inflamed joint may be used for 10 to 15 minutes every hour. Hot packs sometimes feel better, but do not use overnight. Do not use hot packs if you are diabetic without your caregiver's permission.   A cortisone shot into arthritic joints may help reduce pain and swelling.   Any acute arthritis that gets worse over the next 1 to 2 days needs to be looked at to be sure there is no joint infection.  Long-term arthritis treatment involves modifying activities and  lifestyle to reduce joint stress jarring. This can include weight loss. Also, exercise is needed to nourish the joint cartilage and remove waste. This helps keep the muscles around the joint strong. HOME CARE INSTRUCTIONS   Do not take aspirin to relieve pain if gout is suspected. This elevates uric acid levels.   Only take over-the-counter or prescription medicines for pain, discomfort  or fever as directed by your caregiver.   Rest the joint as much as possible.   If your joint is swollen, keep it elevated.   Use crutches if the painful joint is in your leg.   Drinking plenty of fluids may help for certain types of arthritis.   Follow your caregiver's dietary instructions.   Try low-impact exercise such as:   Swimming.   Water aerobics.   Biking.   Walking.   Morning stiffness is often relieved by a warm shower.   Put your joints through regular range-of-motion.  SEEK MEDICAL CARE IF:   You do not feel better in 24 hours or are getting worse.   You have side effects to medications, or are not getting better with treatment.  SEEK IMMEDIATE MEDICAL CARE IF:   You have a fever.   You develop severe joint pain, swelling or redness.   Many joints are involved and become painful and swollen.   There is severe back pain and/or leg weakness.   You have loss of bowel or bladder control.  Document Released: 11/15/2004 Document Revised: 09/27/2011 Document Reviewed: 12/01/2008 [facility] Patient Information 2012 [place], [place].Tendinitis  Tendinitis is redness, soreness, and puffiness (inflammation) of the tendons. Tendons are band-like tissues that connect muscle to bone. Tendinitis often happens in the shoulders, heels, or elbows. It might happen if your job involves doing the same motions over and over. HOME CARE  Use a sling or splint as told by your doctor.   Put ice on the injured area.   Put ice in a plastic bag.   Place a towel between your skin and the bag.   Leave the ice on for 15 to 20 minutes, 3 to 4 times a day.   Avoid using your injured arm or leg until the pain goes away.   Do gentle exercises only as told by your doctor. Stop exercises if the pain gets worse, unless your doctor tells you otherwise.   Only take medicines as told by your doctor.  GET HELP RIGHT AWAY IF:  Your pain and puffiness get worse.   You have new problems,  such as loss of feeling (numbness) in the hands.  MAKE SURE YOU:  Understand these instructions.   Will watch your condition.   Will get help right away if you are not doing well or get worse.  Document Released: 01/18/2011 Document Revised: 09/27/2011 Document Reviewed: 01/18/2011 [facility] Patient Information 2012 [place], [place].Please use the wrist splint for the next 10 days. Use tylenol for mild pain. Use norco for more severe pain. This medication may cause drowsiness. Please see Dr Hilda Lias for evaluation an additional treatment.

## 2012-03-11 DIAGNOSIS — M25549 Pain in joints of unspecified hand: Secondary | ICD-10-CM | POA: Diagnosis not present

## 2012-03-11 DIAGNOSIS — M25539 Pain in unspecified wrist: Secondary | ICD-10-CM | POA: Diagnosis not present

## 2012-03-12 NOTE — ED Provider Notes (Signed)
Medical screening examination/treatment/procedure(s) were performed by non-physician practitioner and as supervising physician I was immediately available for consultation/collaboration.  Sahra Converse, MD 03/12/12 1114 

## 2012-03-13 ENCOUNTER — Ambulatory Visit (INDEPENDENT_AMBULATORY_CARE_PROVIDER_SITE_OTHER): Payer: Medicare Other | Admitting: Family Medicine

## 2012-03-13 ENCOUNTER — Other Ambulatory Visit: Payer: Self-pay

## 2012-03-13 ENCOUNTER — Encounter: Payer: Self-pay | Admitting: Family Medicine

## 2012-03-13 VITALS — BP 134/78 | HR 76 | Resp 18 | Ht 64.0 in | Wt 125.0 lb

## 2012-03-13 DIAGNOSIS — M1990 Unspecified osteoarthritis, unspecified site: Secondary | ICD-10-CM

## 2012-03-13 DIAGNOSIS — M129 Arthropathy, unspecified: Secondary | ICD-10-CM

## 2012-03-13 MED ORDER — OMEPRAZOLE 20 MG PO CPDR: DELAYED_RELEASE_CAPSULE | ORAL | Status: DC

## 2012-03-13 MED ORDER — ATORVASTATIN CALCIUM 10 MG PO TABS: ORAL_TABLET | ORAL | Status: DC

## 2012-03-13 NOTE — Assessment & Plan Note (Signed)
OA of wrist, pt seen by ED and ortho, She has been prescribed Voltaren gel by PCP but has not tried this, she is concerned about side effect of using NSAIDS with her ASA and upset stomach. Given reassurance with use of gel, she can also break hydrocodone in half if needed.

## 2012-03-13 NOTE — Patient Instructions (Signed)
Use the voltaren gel for your wrist and fingers, you can also use it on your knees and shoulders If you have very severe pain you can try 1/2 tablet of the hydrocodone but only if it is really  We will get the record from Dr. Hilda Lias Keep previous f/u appt

## 2012-03-13 NOTE — Progress Notes (Signed)
  Subjective:    Patient ID: Kara [name], female    DOB: 18-Sep-1927, 78 y.o.   MRN: [id number]  HPI Pt presents with right wrist pain, she awoke with pain in wrist on 5/13 she also had swelling in fingers, no specific injury, seen in ED 5/16 diagnosed with arthritis and given hydrocodone which she thinks made her bladder weak, she was then seen by Dr. Hilda Lias 2 days ago and he told her to use advil 2 tablets TID and she was afraid to therefore came here to be checked.   Review of Systems  GEN- denies fatigue, fever, weight loss,weakness, recent illness CVS- denies chest pain, palpitations RESP- denies SOB, cough, wheeze ABD- denies N/V, change in stools, abd pain GU- denies dysuria, hematuria, dribbling, incontinence MSK- + joint pain, muscle aches, injury        Objective:   Physical Exam GEN-NAD,alert and oriented x 3 MSK- rotator cuff in tact, biceps in tact, right wrist- no bony abnormality, good grip, mild deformity of MIP on 2nd and 3rd digits, no swelling noted Pulse - radial 2+       Assessment & Plan:

## 2012-03-21 DIAGNOSIS — M25549 Pain in joints of unspecified hand: Secondary | ICD-10-CM | POA: Diagnosis not present

## 2012-03-21 DIAGNOSIS — M25519 Pain in unspecified shoulder: Secondary | ICD-10-CM | POA: Diagnosis not present

## 2012-03-21 DIAGNOSIS — M25539 Pain in unspecified wrist: Secondary | ICD-10-CM | POA: Diagnosis not present

## 2012-03-29 ENCOUNTER — Other Ambulatory Visit: Payer: Self-pay | Admitting: Family Medicine

## 2012-04-08 ENCOUNTER — Encounter: Payer: Self-pay | Admitting: Internal Medicine

## 2012-04-14 ENCOUNTER — Encounter: Payer: Self-pay | Admitting: Gastroenterology

## 2012-04-14 ENCOUNTER — Ambulatory Visit (INDEPENDENT_AMBULATORY_CARE_PROVIDER_SITE_OTHER): Payer: Medicare Other | Admitting: Gastroenterology

## 2012-04-14 VITALS — BP 138/58 | HR 72 | Temp 98.7°F

## 2012-04-14 DIAGNOSIS — R197 Diarrhea, unspecified: Secondary | ICD-10-CM | POA: Diagnosis not present

## 2012-04-14 DIAGNOSIS — K529 Noninfective gastroenteritis and colitis, unspecified: Secondary | ICD-10-CM

## 2012-04-14 MED ORDER — ALIGN 4 MG PO CAPS: 4.0000 mg | ORAL_CAPSULE | Freq: Every day | ORAL | Status: DC

## 2012-04-14 NOTE — Assessment & Plan Note (Signed)
Diarrhea for several weeks to months. H/O IBS dating back years. Last ABX was Z-pak in 01/2012. Check stool for CDIFF and Giardia. Begin Align one daily for four weeks, samples provided. If stools are negative, may consider lose dose anticholinergic for IBS.

## 2012-04-14 NOTE — Patient Instructions (Addendum)
Please collect stool studies Take align one tablet daily until complete.

## 2012-04-14 NOTE — Progress Notes (Signed)
Primary Care Physician: Syliva Overman, MD  Primary Gastroenterologist:  Roetta Sessions, MD   Chief Complaint  Patient presents with  . Diarrhea  . Abdominal Pain  . Bloated    HPI: Kara [name] is a 78 y.o. female here for further evaluation of diarrhea, bloating. Three times weekly wakes up with upset stomach and diarrhea 3-4 times in morning. Going on for few months. Used to have one BM daily. Did get constipated with vicodin. Doesn't matter what eat. Stool very yellow, no blood. Stomach hurts a little sometimes. This past week more trouble. No n/v. Appetite not good in AMs, but better later in the day. Bloating. Occasionally uses Voltaren gel. Uses aspercreme for arthritis as well. Long h/o IBS, not on medications for years. Heartburn doing okay.   Took Z-pak at end of 01/2012 for sinusitis.  Current Outpatient Prescriptions  Medication Sig Dispense Refill  . acetaminophen (TYLENOL) 500 MG tablet Take 1,000 mg by mouth as needed. For pain       . aspirin EC 81 MG tablet Take 81 mg by mouth daily.        Marland Kitchen atorvastatin (LIPITOR) 10 MG tablet TAKE 1 TABLET BY MOUTH AT BEDTIME  90 tablet  1  . Calcium Carbonate-Vitamin D (CALCIUM 600 + D PO) Take 1 tablet by mouth daily.       . clonazePAM (KLONOPIN) 0.5 MG tablet Take 0.5 mg by mouth daily as needed. For nerves       . diclofenac sodium (VOLTAREN) 1 % GEL Apply once daily to affected joint(s) for uncontrolled pain  100 g  1  . diphenhydrAMINE (BENADRYL) 25 mg capsule Take 25 mg by mouth daily.        . Multiple Vitamin (MULTIVITAMIN) capsule Take 1 capsule by mouth daily.        Marland Kitchen omeprazole (PRILOSEC) 20 MG capsule TAKE ONE CAPSULE EVERY DAY  90 capsule  1  . DISCONTD: omeprazole (PRILOSEC) 20 MG capsule TAKE ONE CAPSULE EVERY DAY  30 capsule  3    Allergies as of 04/14/2012 - Review Complete 04/14/2012  Allergen Reaction Noted  . Amoxicillin-pot clavulanate  04/04/2010  . Cetirizine hcl  02/14/2010  . Mometasone furoate   10/09/2007  . Other  09/20/2011    ROS:  General: Negative for anorexia, weight loss, fever, chills, fatigue, weakness. ENT: Negative for hoarseness, difficulty swallowing , nasal congestion. CV: Negative for chest pain, angina, palpitations, dyspnea on exertion, peripheral edema.  Respiratory: Negative for dyspnea at rest, dyspnea on exertion, cough, sputum, wheezing.  GI: See history of present illness. GU:  Negative for dysuria, hematuria, urinary incontinence, urinary frequency, nocturnal urination.  Endo: Negative for unusual weight change.  MS: positive for joint pain   Physical Examination:   BP 138/58  Pulse 72  Temp 98.7 F (37.1 C) (Temporal)  General: Well-nourished, well-developed elderly WF in no acute distress.  Eyes: No icterus. Mouth: Oropharyngeal mucosa moist and pink , no lesions erythema or exudate. Lungs: Clear to auscultation bilaterally.  Heart: Regular rate and rhythm, no murmurs rubs or gallops.  Abdomen: Bowel sounds are normal, nontender, nondistended, no hepatosplenomegaly or masses, no abdominal bruits or hernia , no rebound or guarding.   Extremities: No lower extremity edema. No clubbing or deformities. Neuro: Alert and oriented x 4   Skin: Warm and dry, no jaundice.   Psych: Alert and cooperative, normal mood and affect.   Imaging Studies: No results found.

## 2012-04-16 NOTE — Progress Notes (Signed)
Quick Note:  Await cdiff. ______

## 2012-04-17 ENCOUNTER — Telehealth: Payer: Self-pay | Admitting: Internal Medicine

## 2012-04-17 NOTE — Telephone Encounter (Signed)
I'm not sure her symptoms are related to Align. She was already having upset stomach and diarrhea in AM and that was reason for OV. CDiff and Giardia negative.   Spoke with patient. Having pain with movement which starts underneath the right breast and downward to ruq. Not related to meals. No SOB or cough. Pain worse with movement. No dysuria. No melena or brbpr.   Advised to continue Align. She will call with PR on Monday. If still having abdominal pain, we will schedule urgent OV. Patient was not having pain at time of OV. Instructed to go to ED if pain worsens.

## 2012-04-17 NOTE — Telephone Encounter (Signed)
Patient is having a lot of gas and bloating, abd pain and wondering if Align is not agreeing with her, please advise

## 2012-04-17 NOTE — Telephone Encounter (Signed)
Pt was seen in office on Monday and was given Align. She says every morning her stomach stays upset because of the medicine and was wondering was this a side effect or not. She also said she is having right sided pain. Please call patient at [phone]

## 2012-04-17 NOTE — Progress Notes (Signed)
Quick Note:  Patient aware. ______ 

## 2012-04-17 NOTE — Telephone Encounter (Signed)
Pt is having R side abd pain that starts 2 inches above her navel, that started Tuesday after taking align. No N/V. Still has diarrhea in the mornings. Advised her to stop taking align if she feels that it is causing her problems. She is concerned about her pancreas and wants to know if there is anything she needs to do. cdiff results are negative. Printed results from [employer] and they are on [facility] desk. Please advise.

## 2012-04-21 ENCOUNTER — Telehealth: Payer: Self-pay | Admitting: Internal Medicine

## 2012-04-21 NOTE — Telephone Encounter (Signed)
Patient still feels bloated and she cant have a bowel movement, Patient is having a problem with Align she didn't take it yesterday please advise?

## 2012-04-21 NOTE — Telephone Encounter (Signed)
Called pt- she said she was still having bloating and a lot of gas, some abd pain but not too bad. Last normal bm was Friday, she had small bm today. Pt doesn't want ov this week because her granddaughter is here from [place] on vacation, but she stated she would call back for ov if pain continues and will go to ED if pain get worse.

## 2012-04-21 NOTE — Telephone Encounter (Signed)
Interesting. She has been c/o diarrhea before. If she does not want to continue the Align, I would suggest she take [facility] OTC. OV if continued problems.

## 2012-05-05 ENCOUNTER — Ambulatory Visit (HOSPITAL_COMMUNITY)
Admission: RE | Admit: 2012-05-05 | Discharge: 2012-05-05 | Disposition: A | Discharge status: Home / Self Care | Payer: Medicare Other | Source: Ambulatory Visit | Attending: Family Medicine | Admitting: Family Medicine

## 2012-05-05 ENCOUNTER — Encounter (HOSPITAL_COMMUNITY): Payer: Self-pay

## 2012-05-05 ENCOUNTER — Ambulatory Visit (INDEPENDENT_AMBULATORY_CARE_PROVIDER_SITE_OTHER): Payer: Medicare Other | Admitting: Family Medicine

## 2012-05-05 ENCOUNTER — Telehealth: Payer: Self-pay | Admitting: Family Medicine

## 2012-05-05 ENCOUNTER — Encounter: Payer: Self-pay | Admitting: Family Medicine

## 2012-05-05 VITALS — BP 132/68 | HR 76 | Temp 98.1°F | Resp 18 | Ht 64.0 in | Wt 122.0 lb

## 2012-05-05 DIAGNOSIS — K5730 Diverticulosis of large intestine without perforation or abscess without bleeding: Secondary | ICD-10-CM | POA: Insufficient documentation

## 2012-05-05 DIAGNOSIS — K449 Diaphragmatic hernia without obstruction or gangrene: Secondary | ICD-10-CM | POA: Diagnosis not present

## 2012-05-05 DIAGNOSIS — R197 Diarrhea, unspecified: Secondary | ICD-10-CM | POA: Insufficient documentation

## 2012-05-05 DIAGNOSIS — R109 Unspecified abdominal pain: Secondary | ICD-10-CM | POA: Diagnosis not present

## 2012-05-05 DIAGNOSIS — K273 Acute peptic ulcer, site unspecified, without hemorrhage or perforation: Secondary | ICD-10-CM | POA: Diagnosis not present

## 2012-05-05 DIAGNOSIS — R0602 Shortness of breath: Secondary | ICD-10-CM

## 2012-05-05 LAB — COMPREHENSIVE METABOLIC PANEL
ALT: 13 U/L (ref 0–35)
Albumin: 4.5 g/dL (ref 3.5–5.2)
CO2: 28 mEq/L (ref 19–32)
Glucose, Bld: 102 mg/dL — ABNORMAL HIGH (ref 70–99)
Potassium: 4.1 mEq/L (ref 3.5–5.3)
Sodium: 136 mEq/L (ref 135–145)
Total Protein: 7.4 g/dL (ref 6.0–8.3)

## 2012-05-05 LAB — CBC WITH DIFFERENTIAL/PLATELET
Basophils Absolute: 0 10*3/uL (ref 0.0–0.1)
Eosinophils Absolute: 0 10*3/uL (ref 0.0–0.7)
Eosinophils Relative: 0 % (ref 0–5)
Lymphocytes Relative: 26 % (ref 12–46)
Lymphs Abs: 2.2 10*3/uL (ref 0.7–4.0)
MCH: 30.7 pg (ref 26.0–34.0)
Neutrophils Relative %: 67 % (ref 43–77)
Platelets: 349 10*3/uL (ref 150–400)
RBC: 3.74 MIL/uL — ABNORMAL LOW (ref 3.87–5.11)
RDW: 13.6 % (ref 11.5–15.5)
WBC: 8.5 10*3/uL (ref 4.0–10.5)

## 2012-05-05 MED ORDER — OMEPRAZOLE 20 MG PO CPDR: DELAYED_RELEASE_CAPSULE | ORAL | Status: DC

## 2012-05-05 NOTE — Patient Instructions (Addendum)
Increase your prilosec to 1 tablet twice a day for the next 2 weeks Get the bloodwork done CT scan of abdomen to be done Chest x-ray also to be done We will call with results If you get worse go to the ER

## 2012-05-05 NOTE — Assessment & Plan Note (Signed)
Acute onset of though her exam is benign she does not appear to be in any respiratory distress at all. Her EKG was also reassuring. I will obtain a chest x-ray she might just be feeling this way because of her abdominal pain. No evidence of fluid overload she has no CHF history

## 2012-05-05 NOTE — Telephone Encounter (Signed)
I spoke with pt no reason on CT scan for her pain, noted new compression fracture she states her back has been giving her more trouble Increased PPI with belching and upset stomach, labs unremarkable thus far, H pylori pending I thinks she will have to go back to GI possible EGD?? Will send to Dr.Bethea for her back pain and compression fracture- she agreed PPI refilled

## 2012-05-05 NOTE — Progress Notes (Signed)
  Subjective:    Patient ID: Kara [name], female    DOB: 05-17-1927, 78 y.o.   MRN: [id number]  HPI   Patient presents with abdominal pain x3-1/2 weeks. She was seen by gastroenterology about 3 weeks ago at that time she was not having severe pain but more diarrhea. She was started on probiotics however was unable to tolerate this she stated calls more bloating and upset stomach. The diarrhea has pretty much resolved but she now has pain all over her abdomen which is getting worse. She admits to fatigue denies fever has some nausea and feels like she has to belch a lot. She is passing gas. She's had a colonoscopy which was reviewed which showed diverticulosis. She states she is losing weight because her appetite is poor. She also has had some shortness of breath the past couple of days but denies chest pain.   Review of Systems - Per above   GEN- + fatigue,Denies  fever, weight loss,weakness, recent illness HEENT- denies eye drainage, change in vision, nasal discharge, CVS- denies chest pain, palpitations RESP- + SOB, denies cough, wheeze ABD- +N/denies V, change in stools, +abd pain GU- denies dysuria, hematuria, dribbling, incontinence MSK- + joint pain, muscle aches, injury Neuro- denies headache, dizziness, syncope, seizure activity      Objective:   Physical Exam GEN- NAD, alert and oriented x3,w eight down 3lbs ( in 6 weeks), non toxic appearing, HEENT- PERRL, EOMI, non injected sclera, pink conjunctiva, MMM, oropharynx clear Neck- Supple, no LAD CVS- RRR, no murmur RESP-CTAB, normal WOB ABD-NABS,soft,TTP diffusely, no rebound, no guarding, no CVA tenderness, no mass felt  EXT- No edema Pulses- Radial, DP- 2+  EKG- NSR, PVC noted, no ST changes        Assessment & Plan:

## 2012-05-05 NOTE — Assessment & Plan Note (Addendum)
Persistent abdominal pain with anorexia in elderly female, stat labs, CXR and CT scan of abdomen pelvis to be done today Differential colitis, gallbladder etiology, H. Pylori,  malignancy.

## 2012-05-06 LAB — H. PYLORI ANTIBODY, IGG: H Pylori IgG: 0.67 {ISR}

## 2012-05-07 ENCOUNTER — Ambulatory Visit (INDEPENDENT_AMBULATORY_CARE_PROVIDER_SITE_OTHER): Payer: Medicare Other | Admitting: Family Medicine

## 2012-05-07 ENCOUNTER — Encounter: Payer: Self-pay | Admitting: Family Medicine

## 2012-05-07 VITALS — BP 122/70 | HR 79 | Resp 16 | Ht 64.0 in | Wt 121.0 lb

## 2012-05-07 DIAGNOSIS — J309 Allergic rhinitis, unspecified: Secondary | ICD-10-CM

## 2012-05-07 DIAGNOSIS — K219 Gastro-esophageal reflux disease without esophagitis: Secondary | ICD-10-CM

## 2012-05-07 DIAGNOSIS — R109 Unspecified abdominal pain: Secondary | ICD-10-CM | POA: Insufficient documentation

## 2012-05-07 DIAGNOSIS — E785 Hyperlipidemia, unspecified: Secondary | ICD-10-CM

## 2012-05-07 DIAGNOSIS — N3000 Acute cystitis without hematuria: Secondary | ICD-10-CM

## 2012-05-07 LAB — POCT URINALYSIS DIPSTICK
Bilirubin, UA: NEGATIVE
Glucose, UA: NEGATIVE
Ketones, UA: NEGATIVE
Protein, UA: NEGATIVE
Spec Grav, UA: >=1.03

## 2012-05-07 MED ORDER — HYOSCYAMINE SULFATE ER 0.375 MG PO TB12: 0.3750 mg | ORAL_TABLET | Freq: Two times a day (BID) | ORAL | Status: DC | PRN

## 2012-05-07 MED ORDER — SUCRALFATE 1 GM/10ML PO SUSP: 1.0000 g | Freq: Four times a day (QID) | ORAL | Status: DC

## 2012-05-07 NOTE — Patient Instructions (Addendum)
F/u in 4 weeks    Urine is being checked for infection and is normal  Use phillips milk of magnesia for constipation.  I will send in medicine for muscle spasm and burning in the stomach  Stop probiotic since this is bothering you

## 2012-05-11 DIAGNOSIS — N3000 Acute cystitis without hematuria: Secondary | ICD-10-CM | POA: Insufficient documentation

## 2012-05-11 NOTE — Assessment & Plan Note (Signed)
Uncontrolled, med changes made

## 2012-05-11 NOTE — Assessment & Plan Note (Signed)
Controlled, no change in medication  

## 2012-05-11 NOTE — Assessment & Plan Note (Signed)
Controlled on as needed OTC medication only

## 2012-05-11 NOTE — Assessment & Plan Note (Signed)
Unchanged since visit 1 day ago, pt reports intolerance to probiotic, she wioll stop this, start antispasmodic and carafate. She is to continue to follow with GI

## 2012-05-11 NOTE — Progress Notes (Signed)
  Subjective:    Patient ID: Kara [name], female    DOB: [birth year], 78 y.o.   MRN: [id number]  HPI Pt in c/o ongoing uncontrolled abdominal pain with spasm, and burning. Feels as though the probiotic started is worsening her symptoms. No fever or chills. C/o urinary frequency and mild dysuria x 3 days   Review of Systems See HPI Denies recent fever or chills. Denies sinus pressure, nasal congestion, ear pain or sore throat. Denies chest congestion, productive cough or wheezing. Denies chest pains, palpitations and leg swelling  Chronic  joint pain,  and limitation in mobility. Denies headaches, seizures, numbness, or tingling. Denies depression, anxiety or insomnia. Denies skin break down or rash.        Objective:   Physical Exam Patient alert and oriented and in no cardiopulmonary distress.  HEENT: No facial asymmetry, EOMI, no sinus tenderness,  oropharynx pink and moist.  Neck supple no adenopathy.  Chest: Clear to auscultation bilaterally.  CVS: S1, S2 no murmurs, no S3.  ABD: Soft generalized diffuse tenderness, no guarding or rebound, no organomegaly or masses. Hyperactive bowel sounds. Suprapubic tenderness, no renal angle tenderness  Ext: No edema  MS: Adequate ROM spine, shoulders, hips and knees.  Skin: Intact, no ulcerations or rash noted.  Psych: Good eye contact, normal affect. Memory intact not anxious or depressed appearing.  CNS: CN 2-12 intact, power, tone and sensation normal throughout.        Assessment & Plan:

## 2012-05-11 NOTE — Assessment & Plan Note (Signed)
ccua checked and is negative for infection

## 2012-05-12 ENCOUNTER — Telehealth: Payer: Self-pay | Admitting: Family Medicine

## 2012-05-12 NOTE — Telephone Encounter (Signed)
After she started taking the carafate she started feeling very irritated,and burning all over. She stopped the medicine and she is hurting so bad today in her stomach. She really needs something else sent in today if possible.

## 2012-05-12 NOTE — Telephone Encounter (Signed)
pls let pt know i have really no other options since she is already on a PPI. I am sorry the carafate made her feel badly, she will need her GI Doc to work with her about this severe stomach pin she is having

## 2012-05-13 ENCOUNTER — Telehealth: Payer: Self-pay | Admitting: Gastroenterology

## 2012-05-13 NOTE — Telephone Encounter (Signed)
Patient continues to have work-up for abdominal pain via PCP. Per notes from Dr. Jeanice Lim and Dr. Lodema Hong, they want her to f/u here. Please offer OV here.

## 2012-05-13 NOTE — Telephone Encounter (Signed)
Patient aware.

## 2012-05-13 NOTE — Telephone Encounter (Signed)
Darl Pikes, please offer pt appt.

## 2012-05-14 ENCOUNTER — Encounter: Payer: Self-pay | Admitting: Family Medicine

## 2012-05-19 ENCOUNTER — Telehealth: Payer: Self-pay | Admitting: Internal Medicine

## 2012-05-19 NOTE — Telephone Encounter (Signed)
Pt is aware of OV 7/31@1030 

## 2012-05-19 NOTE — Telephone Encounter (Signed)
Pt is aware of OV this week.  °

## 2012-05-19 NOTE — Telephone Encounter (Signed)
Pt called this afternoon asking to speak with RMR's nurse regarding that she still has stomach issues. I have made her an OV for this Wednesday at 1030 with AS.

## 2012-05-20 ENCOUNTER — Encounter: Payer: Self-pay | Admitting: Internal Medicine

## 2012-05-20 NOTE — Telephone Encounter (Signed)
Noted  

## 2012-05-20 NOTE — Telephone Encounter (Signed)
Spoke with pt- she is still having the same problems as before. She is having upper and lower abd pain that comes and goes. Last bm was this morning and it was normal. No bad diarrhea lately, no n/v, no fever. Dr. Lodema Hong gave her carafate and she said it felt like she was getting a rash from it so she stopped taking it. She has an appointment tomorrow with AS.

## 2012-05-21 ENCOUNTER — Other Ambulatory Visit: Payer: Self-pay | Admitting: Internal Medicine

## 2012-05-21 ENCOUNTER — Ambulatory Visit (INDEPENDENT_AMBULATORY_CARE_PROVIDER_SITE_OTHER): Payer: Medicare Other | Admitting: Gastroenterology

## 2012-05-21 ENCOUNTER — Encounter: Payer: Self-pay | Admitting: Gastroenterology

## 2012-05-21 VITALS — BP 145/69 | HR 74 | Temp 97.4°F | Ht 63.0 in | Wt 120.2 lb

## 2012-05-21 DIAGNOSIS — R1013 Epigastric pain: Secondary | ICD-10-CM

## 2012-05-21 DIAGNOSIS — K3189 Other diseases of stomach and duodenum: Secondary | ICD-10-CM | POA: Diagnosis not present

## 2012-05-21 DIAGNOSIS — R197 Diarrhea, unspecified: Secondary | ICD-10-CM

## 2012-05-21 DIAGNOSIS — K529 Noninfective gastroenteritis and colitis, unspecified: Secondary | ICD-10-CM

## 2012-05-21 NOTE — Assessment & Plan Note (Addendum)
78 year old female with new-onset. +loss of appetite, wt loss, early satiety. +epigastric pain. CT as above. Gallbladder remains in situ. Does not RUQ more often than LUQ pain. Pt poor historian, somewhat difficult to obtain complete hx. However, needs updated EGD due to new-onset dyspepsia. Biliary etiology remains in differential. Last [place] in 2012 without gallstones. May need to revisit updated [place] vs HIDA if no findings on EGD. Continue PPI.  Proceed with upper endoscopy in the near future with Dr. Jena Gauss. The risks, benefits, and alternatives have been discussed in detail with patient. They have stated understanding and desire to proceed.  Consider [place] vs HIDA Follow low-fat diet for now

## 2012-05-21 NOTE — Progress Notes (Signed)
Faxed to PCP

## 2012-05-21 NOTE — Patient Instructions (Addendum)
Continue Prilosec daily.   We have set you up for an upper endoscopy in the near future with Dr. Jena Gauss. Further recommendations to follow.
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
## 2012-05-21 NOTE — Assessment & Plan Note (Signed)
Improved. Hx of IBS. TCS up-to-date. Unable to fill Levbid due to price. Hold off on any changes unless increase in stool frequency. Pt feels overall much improved from last visit.

## 2012-05-26 ENCOUNTER — Encounter (HOSPITAL_COMMUNITY): Payer: Self-pay | Admitting: Pharmacy Technician

## 2012-05-30 ENCOUNTER — Ambulatory Visit (INDEPENDENT_AMBULATORY_CARE_PROVIDER_SITE_OTHER): Payer: Medicare Other | Admitting: Family Medicine

## 2012-05-30 ENCOUNTER — Encounter: Payer: Self-pay | Admitting: Family Medicine

## 2012-05-30 VITALS — BP 138/72 | HR 76 | Temp 98.1°F | Resp 16 | Ht 64.0 in | Wt 121.0 lb

## 2012-05-30 DIAGNOSIS — IMO0002 Reserved for concepts with insufficient information to code with codable children: Secondary | ICD-10-CM

## 2012-05-30 DIAGNOSIS — T148XXA Other injury of unspecified body region, initial encounter: Secondary | ICD-10-CM

## 2012-05-30 DIAGNOSIS — M549 Dorsalgia, unspecified: Secondary | ICD-10-CM | POA: Diagnosis not present

## 2012-05-30 DIAGNOSIS — J019 Acute sinusitis, unspecified: Secondary | ICD-10-CM | POA: Diagnosis not present

## 2012-05-30 DIAGNOSIS — J40 Bronchitis, not specified as acute or chronic: Secondary | ICD-10-CM | POA: Diagnosis not present

## 2012-05-30 MED ORDER — AZITHROMYCIN 250 MG PO TABS: ORAL_TABLET | ORAL | Status: AC

## 2012-05-30 NOTE — Patient Instructions (Addendum)
Use the nasal saline  Take the antibiotics as prescribed Plenty of fluids  Call Monday to let nurse know if you are antibiotics  Referral to Dr. Eduard Clos Keep Previous appt

## 2012-06-01 DIAGNOSIS — J0110 Acute frontal sinusitis, unspecified: Secondary | ICD-10-CM | POA: Insufficient documentation

## 2012-06-01 DIAGNOSIS — J40 Bronchitis, not specified as acute or chronic: Secondary | ICD-10-CM | POA: Insufficient documentation

## 2012-06-01 NOTE — Assessment & Plan Note (Signed)
Treat based on age and symptoms, she tolerates Zpak well

## 2012-06-01 NOTE — Progress Notes (Signed)
  Subjective:    Patient ID: Kara [name], female    DOB: April 30, 1927, 78 y.o.   MRN: [id number]  HPI  Sinus drainage and congestion x 1 week, cough productive of sputum. No SOB, no CP, no fever.. Pt here with daughter. She has tried multiple OTC meds. Allergy to inhaled nasal steroids. She is concerned because her ears have been buzzing and hurting at night, she has had this in the past. She has chronic back pain, CT abdomen pelvis showed concern for small compression fracture that was new.     Review of Systems - per above   GEN- denies fatigue, fever, weight loss,weakness, recent illness HEENT- denies eye drainage, change in vision, nasal discharge, CVS- denies chest pain, palpitations RESP- denies SOB, cough, wheeze ABD- denies N/V, change in stools, abd pain GU- denies dysuria, hematuria, dribbling, incontinence MSK- denies joint pain, muscle aches, injury Neuro- denies headache, dizziness, syncope, seizure activity      Objective:   Physical Exam GEN- NAD, alert and oriented x3 HEENT- PERRL, EOMI, non injected sclera, pink conjunctiva, MMM, oropharynx clear, TM clear bilat no effusion, TTP left maxillary sinus, clear rhinorrhea Neck- Supple, small shotty nodes anterior chain left CVS- RRR, no murmur RESP-Course BS right side, clear with cough, upper airway congestion, no rales, no wheeze, normal WOB ABD-NABS,soft,mild diffuse TTP, no rebound, no guarding,ND EXT- No edema Pulses- Radial, DP- 2+ Back- lumbar spine mild TTP, non antalgic gait, fair ROM        Assessment & Plan:

## 2012-06-01 NOTE — Assessment & Plan Note (Signed)
Antibiotics, nasal saline 

## 2012-06-01 NOTE — Assessment & Plan Note (Signed)
Chronic back pain, appears to be ? Small end plate compression fracture, she has had PT in the past. Will send to Dr. Eduard Clos for any recommendations for chronic pain and the fracture.

## 2012-06-03 ENCOUNTER — Encounter (HOSPITAL_COMMUNITY): Payer: Self-pay | Admitting: *Deleted

## 2012-06-03 ENCOUNTER — Other Ambulatory Visit: Payer: Self-pay | Admitting: Internal Medicine

## 2012-06-03 ENCOUNTER — Ambulatory Visit (HOSPITAL_COMMUNITY)
Admission: RE | Admit: 2012-06-03 | Discharge: 2012-06-03 | Disposition: A | Discharge status: Home / Self Care | Payer: Medicare Other | Source: Ambulatory Visit | Attending: Internal Medicine | Admitting: Internal Medicine

## 2012-06-03 ENCOUNTER — Encounter (HOSPITAL_COMMUNITY)
Admission: RE | Disposition: A | Discharge status: Home / Self Care | Payer: Self-pay | Source: Ambulatory Visit | Attending: Internal Medicine

## 2012-06-03 DIAGNOSIS — Q391 Atresia of esophagus with tracheo-esophageal fistula: Secondary | ICD-10-CM | POA: Diagnosis not present

## 2012-06-03 DIAGNOSIS — K319 Disease of stomach and duodenum, unspecified: Secondary | ICD-10-CM | POA: Diagnosis not present

## 2012-06-03 DIAGNOSIS — K2980 Duodenitis without bleeding: Secondary | ICD-10-CM | POA: Diagnosis not present

## 2012-06-03 DIAGNOSIS — R1010 Upper abdominal pain, unspecified: Secondary | ICD-10-CM

## 2012-06-03 DIAGNOSIS — R1013 Epigastric pain: Secondary | ICD-10-CM

## 2012-06-03 DIAGNOSIS — R197 Diarrhea, unspecified: Secondary | ICD-10-CM

## 2012-06-03 DIAGNOSIS — R109 Unspecified abdominal pain: Secondary | ICD-10-CM | POA: Insufficient documentation

## 2012-06-03 DIAGNOSIS — E785 Hyperlipidemia, unspecified: Secondary | ICD-10-CM | POA: Insufficient documentation

## 2012-06-03 DIAGNOSIS — R6881 Early satiety: Secondary | ICD-10-CM | POA: Insufficient documentation

## 2012-06-03 HISTORY — PX: ESOPHAGOGASTRODUODENOSCOPY: SHX5428

## 2012-06-03 SURGERY — EGD (ESOPHAGOGASTRODUODENOSCOPY)
Anesthesia: Moderate Sedation

## 2012-06-03 MED ORDER — STERILE WATER FOR IRRIGATION IR SOLN
Status: DC | PRN
  Administered 2012-06-03: 14:00:00

## 2012-06-03 MED ORDER — MIDAZOLAM HCL 5 MG/5ML IJ SOLN
INTRAMUSCULAR | Status: AC
  Filled 2012-06-03: qty 10

## 2012-06-03 MED ORDER — SODIUM CHLORIDE 0.45 % IV SOLN
Freq: Once | INTRAVENOUS | Status: AC
  Administered 2012-06-03: 1000 mL via INTRAVENOUS

## 2012-06-03 MED ORDER — BUTAMBEN-TETRACAINE-BENZOCAINE 2-2-14 % EX AERO
INHALATION_SPRAY | CUTANEOUS | Status: DC | PRN
  Administered 2012-06-03: 2 via TOPICAL

## 2012-06-03 MED ORDER — MIDAZOLAM HCL 5 MG/5ML IJ SOLN
INTRAMUSCULAR | Status: DC | PRN
  Administered 2012-06-03: 2 mg via INTRAVENOUS

## 2012-06-03 MED ORDER — MEPERIDINE HCL 100 MG/ML IJ SOLN
INTRAMUSCULAR | Status: AC
  Filled 2012-06-03: qty 1

## 2012-06-03 MED ORDER — MEPERIDINE HCL 100 MG/ML IJ SOLN
INTRAMUSCULAR | Status: DC | PRN
  Administered 2012-06-03: 50 mg via INTRAVENOUS

## 2012-06-03 NOTE — Interval H&P Note (Signed)
History and Physical Interval Note:  06/03/2012 2:16 PM  Kara [name]  has presented today for surgery, with the diagnosis of Dyspepsia  The various methods of treatment have been discussed with the patient and family. After consideration of risks, benefits and other options for treatment, the patient has consented to  Procedure(s) (LRB): ESOPHAGOGASTRODUODENOSCOPY (EGD) (N/A) as a surgical intervention .  The patient's history has been reviewed, patient examined, no change in status, stable for surgery.  I have reviewed the patient's chart and labs.  Questions were answered to the patient's satisfaction.     Eula Listen
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
## 2012-06-03 NOTE — Op Note (Signed)
[facility] [address] [place], [place]  [address]  ENDOSCOPY PROCEDURE REPORT  PATIENT:  Kara [name], Kara [name]  MR#:  [id number] BIRTHDATE:  [birth year], 84 yrs. old  GENDER:  female  ENDOSCOPIST:  R. Roetta Sessions, MD Caleen Essex Referred by:  Syliva Overman, M.D.  PROCEDURE DATE:  06/03/2012 PROCEDURE:  EGD with duodenal bulbar tops he  INDICATIONS:   early satiety, upper abdominal discomfort.  INFORMED CONSENT:   The risks, benefits, limitations, alternatives and imponderables have been discussed.  The potential for biopsy, esophogeal dilation, etc. have also been reviewed.  Questions have been answered.  All parties agreeable.  Please see the history and physical in the medical record for more information.  MEDICATIONS:     Versed 2 mg IV and Demerol 50 mg IV. Cetacaine spray.  DESCRIPTION OF PROCEDURE:   The EG-[id number] ([id number]) endoscope was introduced through the mouth and advanced to the second portion of the duodenum without difficulty or limitations.  The mucosal surfaces were surveyed very carefully during advancement of the scope and upon withdrawal.  Retroflexion view of the proximal stomach and esophagogastric junction was performed.  <<PROCEDUREIMAGES>>  FINDINGS:  Schatzki's ring; otherwise normal esophagus. Stomach empty. 3 cm hiatal hernia. Gastric mucosa appeared normal. Pylorus patent and easily traversed. Examination bulb and second portion revealed a 3 mm adenomatous appearing sessile polyp in     the duodenal bulb. Please see photos. Otherwise, the first and second portion duodenum appeared normal.  THERAPEUTIC / DIAGNOSTIC MANEUVERS PERFORMED:  The duodenal polyp was removed with cold biopsy technique.  COMPLICATIONS:   None  IMPRESSION:  Schatzki's ring-not manipulated because no dysphagia. 3 cm hiatal hernia. Duodenal bulbar nodules status post biopsy and removal.  RECOMMENDATIONS:  Today's findings would adequately explain all of her  upper GI tract symptoms satisfactorily. Therefore, we will proceed with a HIDA scan to further evaluate for potential underlying gallbladder disease. Gallbladder ultrasound negative last year. Followup on pathology.  ______________________________ R. Roetta Sessions, MD Caleen Essex  CC:  n. eSIGNED:   R. Roetta Sessions at 06/03/2012 02:39 PM  Bertram Gala, [phone]

## 2012-06-05 ENCOUNTER — Telehealth: Payer: Self-pay | Admitting: Internal Medicine

## 2012-06-05 ENCOUNTER — Ambulatory Visit: Payer: Medicare Other | Admitting: Family Medicine

## 2012-06-05 NOTE — Telephone Encounter (Signed)
Patient is scheduled for a HIDA scan on Tuesday 08/20 at 8:00 am and patient is aware

## 2012-06-06 ENCOUNTER — Telehealth: Payer: Self-pay | Admitting: Family Medicine

## 2012-06-06 ENCOUNTER — Encounter: Payer: Self-pay | Admitting: Internal Medicine

## 2012-06-06 NOTE — Telephone Encounter (Signed)
pls let pt know she can contact Dr on call for Dr Rourke's office by calling [facility] if she feels symptoms are related to her procedure. General advice fopr pain and chill is to take tylenol 325 mg one 3 times daily for 2 to 3 days, as needed, if no relief then needs to go to Ed for eval

## 2012-06-06 NOTE — Telephone Encounter (Signed)
What do I need to advise her to do?

## 2012-06-09 ENCOUNTER — Encounter: Payer: Self-pay | Admitting: *Deleted

## 2012-06-09 ENCOUNTER — Encounter (HOSPITAL_COMMUNITY): Payer: Self-pay | Admitting: Internal Medicine

## 2012-06-09 NOTE — Telephone Encounter (Signed)
Patient is ok now

## 2012-06-10 ENCOUNTER — Encounter (HOSPITAL_COMMUNITY): Payer: Self-pay

## 2012-06-10 ENCOUNTER — Ambulatory Visit (HOSPITAL_COMMUNITY)
Admission: RE | Admit: 2012-06-10 | Discharge: 2012-06-10 | Disposition: A | Discharge status: Home / Self Care | Payer: Medicare Other | Source: Ambulatory Visit | Attending: Internal Medicine | Admitting: Internal Medicine

## 2012-06-10 DIAGNOSIS — R197 Diarrhea, unspecified: Secondary | ICD-10-CM | POA: Diagnosis not present

## 2012-06-10 DIAGNOSIS — R109 Unspecified abdominal pain: Secondary | ICD-10-CM | POA: Insufficient documentation

## 2012-06-10 DIAGNOSIS — R1010 Upper abdominal pain, unspecified: Secondary | ICD-10-CM

## 2012-06-10 MED ORDER — TECHNETIUM TC 99M MEBROFENIN IV KIT
5.0000 | PACK | Freq: Once | INTRAVENOUS | Status: AC | PRN
  Administered 2012-06-10: 4.7 via INTRAVENOUS

## 2012-06-12 ENCOUNTER — Encounter: Payer: Self-pay | Admitting: Family Medicine

## 2012-06-12 ENCOUNTER — Ambulatory Visit (INDEPENDENT_AMBULATORY_CARE_PROVIDER_SITE_OTHER): Payer: Medicare Other | Admitting: Family Medicine

## 2012-06-12 VITALS — BP 140/70 | HR 72 | Resp 16 | Ht 64.0 in | Wt 120.0 lb

## 2012-06-12 DIAGNOSIS — M549 Dorsalgia, unspecified: Secondary | ICD-10-CM

## 2012-06-12 DIAGNOSIS — R109 Unspecified abdominal pain: Secondary | ICD-10-CM

## 2012-06-12 NOTE — Progress Notes (Signed)
  Subjective:    Patient ID: Kara [name], female    DOB: 01-03-1927, 78 y.o.   MRN: [id number]  HPI  Patient presents to followup recent testing. She has no specific concerns. She has been seen by gastroenterology secondary to chronic abdominal pain and dyspepsia. She had an EEG which showed chronic inflammation in the duodenum she also had a duodenal polyp removed. She also had a hiatus scan with ejection fraction which was normal. She also was concerned about the small compression fracture seen on her CT scan.   Review of Systems - per above  GEN- denies fatigue, fever, weight loss,weakness, recent illness CVS- denies chest pain, palpitations RESP- denies SOB, cough, wheeze ABD- denies N/V, change in stools, +abd pain MSK- + joint pain, muscle aches, injury        Objective:   Physical Exam GEN- NAD, alert and oriented  X 3       Assessment & Plan:

## 2012-06-12 NOTE — Patient Instructions (Signed)
Avoid the spicy foods, avoid foods that cause upset stomach Continue the prilosec  Keep appt with Dr. Eduard Clos  F/U 6 weeks Dr Lodema Hong

## 2012-06-12 NOTE — Assessment & Plan Note (Signed)
She has chronic abdominal pain on and off mostly with dyspepsia. She's currently on a PPI. I reviewed the results of her recent EGD in highest in. She is awaiting GI to call back with further instructions. At this time I advised her to avoid any foods that may cause her upset stomach such as spicy foods and fatty foods. There has been no recent changes in her abdominal pain and she was particularly here for me to go over the results with her in person

## 2012-06-12 NOTE — Assessment & Plan Note (Signed)
Small compression fracture, her pain has improved she states, she has f/u with specialist already established

## 2012-06-16 ENCOUNTER — Other Ambulatory Visit (HOSPITAL_COMMUNITY): Payer: Self-pay | Admitting: Physical Medicine and Rehabilitation

## 2012-06-16 ENCOUNTER — Ambulatory Visit (HOSPITAL_COMMUNITY)
Admission: RE | Admit: 2012-06-16 | Discharge: 2012-06-16 | Disposition: A | Discharge status: Home / Self Care | Payer: Medicare Other | Source: Ambulatory Visit | Attending: Physical Medicine and Rehabilitation | Admitting: Physical Medicine and Rehabilitation

## 2012-06-16 DIAGNOSIS — M5030 Other cervical disc degeneration, unspecified cervical region: Secondary | ICD-10-CM

## 2012-06-16 DIAGNOSIS — M47812 Spondylosis without myelopathy or radiculopathy, cervical region: Secondary | ICD-10-CM

## 2012-06-16 DIAGNOSIS — C750 Malignant neoplasm of parathyroid gland: Secondary | ICD-10-CM | POA: Diagnosis not present

## 2012-06-16 DIAGNOSIS — M25519 Pain in unspecified shoulder: Secondary | ICD-10-CM | POA: Insufficient documentation

## 2012-06-16 DIAGNOSIS — G8929 Other chronic pain: Secondary | ICD-10-CM | POA: Diagnosis not present

## 2012-06-16 DIAGNOSIS — M47817 Spondylosis without myelopathy or radiculopathy, lumbosacral region: Secondary | ICD-10-CM | POA: Diagnosis not present

## 2012-06-16 DIAGNOSIS — Z79899 Other long term (current) drug therapy: Secondary | ICD-10-CM | POA: Diagnosis not present

## 2012-06-16 DIAGNOSIS — M542 Cervicalgia: Secondary | ICD-10-CM

## 2012-06-16 DIAGNOSIS — G894 Chronic pain syndrome: Secondary | ICD-10-CM

## 2012-06-16 DIAGNOSIS — M19219 Secondary osteoarthritis, unspecified shoulder: Secondary | ICD-10-CM

## 2012-06-30 ENCOUNTER — Encounter: Payer: Self-pay | Admitting: Internal Medicine

## 2012-07-01 ENCOUNTER — Ambulatory Visit (INDEPENDENT_AMBULATORY_CARE_PROVIDER_SITE_OTHER): Payer: Medicare Other | Admitting: Gastroenterology

## 2012-07-01 ENCOUNTER — Encounter: Payer: Self-pay | Admitting: Gastroenterology

## 2012-07-01 VITALS — BP 135/64 | HR 63 | Temp 98.1°F | Ht 63.0 in | Wt 121.2 lb

## 2012-07-01 DIAGNOSIS — K589 Irritable bowel syndrome without diarrhea: Secondary | ICD-10-CM | POA: Diagnosis not present

## 2012-07-01 DIAGNOSIS — R109 Unspecified abdominal pain: Secondary | ICD-10-CM | POA: Diagnosis not present

## 2012-07-01 MED ORDER — DICYCLOMINE HCL 10 MG PO CAPS: 10.0000 mg | ORAL_CAPSULE | Freq: Three times a day (TID) | ORAL | Status: DC | PRN

## 2012-07-01 NOTE — Assessment & Plan Note (Signed)
UGI symptoms seemed to have improved. No early satiety or pp abdominal pain at this point. Still with some "upset" stomach and stool consistency loose but infrequent. She gives long h/o IBS. She has had extensive evaluation at this point. Would recommend increase Benefiber to BID as seems to have helped at once daily dosing. If still with loose stool, then trial of low-dose Bentyl. RX sent to pharmacy.   Chronic stable anemia.  Reassuring, no significant weight loss.   OV in four months with Dr. Jena Gauss.

## 2012-07-01 NOTE — Progress Notes (Signed)
Primary Care Physician: Syliva Overman, MD  Primary Gastroenterologist:  Roetta Sessions, MD   Chief Complaint  Patient presents with  . Follow-up    HPI: Kara [name] is a 78 y.o. female here f/u chronic abdominal pain. Seen in 04/2012 with c/o several month h/o early satiety, mild weight loss, epigastric pain. EGD 06/03/12 showed duodenal bulbar nodules, bx showed chronic duodenitis, not likely to explain all of her upper GI tract symptoms. CT without contrast in 04/2012 showed moderate sized hh, sigmoid tics, new compression deformity of L5. HIDA last month showed GB EF of 87% with no reproduction of symptoms. Patient previously intolerant of Carafate. Could not afford Levbid. She also has long h/o IBS.   1-2 loose stools daily. No melena, brbpr. No significant abdominal cramps. Corn "tore" up stomach last week. Right sided pain, right flank and into axilla. Not postprandial. MRI shoulder planned this month. Not sure if pain coming from shoulder. Pain worse with activity. Also wonders if from hip, painful with palpation of hip. Some pp bloating. Teaspoon of benefiber in coffee each morning, helps some. Helps upset stomach. Appetite fair, never been big eater. No heartburn. No vomiting.   Current Outpatient Prescriptions  Medication Sig Dispense Refill  . acetaminophen (TYLENOL) 500 MG tablet Take 1,000 mg by mouth as needed. For pain       . aspirin EC 81 MG tablet Take 81 mg by mouth daily.        Marland Kitchen atorvastatin (LIPITOR) 10 MG tablet TAKE 1 TABLET BY MOUTH AT BEDTIME  90 tablet  1  . Calcium Carbonate-Vitamin D (CALCIUM 600 + D PO) Take 1 tablet by mouth daily.       . clonazePAM (KLONOPIN) 0.5 MG tablet Take 0.25 mg by mouth at bedtime as needed. Sleep aid which she very seldom takes      . diphenhydrAMINE (BENADRYL) 25 mg capsule Take 25 mg by mouth daily.        . hydroxypropyl methylcellulose (ISOPTO TEARS) 2.5 % ophthalmic solution Place 1 drop into both eyes daily as needed. Allergies  and dry eyes      . Multiple Vitamin (MULTIVITAMIN) capsule Take 1 capsule by mouth daily.        Marland Kitchen omeprazole (PRILOSEC) 20 MG capsule TAKE ONE CAPSULE EVERY DAY  90 capsule  1  . Wheat Dextrin (BENEFIBER) POWD Take 5 mLs by mouth daily. Mixes in beverage        Allergies as of 07/01/2012 - Review Complete 07/01/2012  Allergen Reaction Noted  . Amoxicillin-pot clavulanate  04/04/2010  . Carafate (sucralfate) Other (See Comments) 05/21/2012  . Cetirizine hcl  02/14/2010  . Mometasone furoate  10/09/2007  . Other  09/20/2011    ROS:  General: Negative for anorexia, weight loss, fever, chills, fatigue, weakness. ENT: Negative for hoarseness, difficulty swallowing , nasal congestion. CV: Negative for chest pain, angina, palpitations, dyspnea on exertion, peripheral edema.  Respiratory: Negative for dyspnea at rest, dyspnea on exertion, cough, sputum, wheezing.  GI: See history of present illness. GU:  Negative for dysuria, hematuria, urinary incontinence, urinary frequency, nocturnal urination.  Endo: Negative for unusual weight change.    Physical Examination:   BP 135/64  Pulse 63  Temp 98.1 F (36.7 C) (Temporal)  Ht 5\' 3"  (1.6 m)  Wt 121 lb 3.2 oz (54.976 kg)  BMI 21.47 kg/m2  General: Well-nourished, well-developed in no acute distress.  Eyes: No icterus. Mouth: Oropharyngeal mucosa moist and pink , no lesions erythema or  exudate. Lungs: Clear to auscultation bilaterally.  Heart: Regular rate and rhythm, no murmurs rubs or gallops.  Abdomen: Bowel sounds are normal, minimal diffuse lower abd tenderness, nondistended, no hepatosplenomegaly or masses, no abdominal bruits or hernia , no rebound or guarding.   Extremities: No lower extremity edema. No clubbing or deformities. Neuro: Alert and oriented x 4   Skin: Warm and dry, no jaundice.   Psych: Alert and cooperative, normal mood and affect.  Labs:  Lab Results  Component Value Date   LIPASE 53 05/05/2012   Lab  Results  Component Value Date   AMYLASE 69 05/05/2012   Lab Results  Component Value Date   ALT 13 05/05/2012   AST 23 05/05/2012   ALKPHOS 56 05/05/2012   BILITOT 0.3 05/05/2012   Lab Results  Component Value Date   CREATININE 0.61 05/05/2012   BUN 7 05/05/2012   NA 136 05/05/2012   K 4.1 05/05/2012   CL 96 05/05/2012   CO2 28 05/05/2012   Lab Results  Component Value Date   WBC 8.5 05/05/2012   HGB 11.5* 05/05/2012   HCT 35.0* 05/05/2012   MCV 93.6 05/05/2012   PLT 349 05/05/2012    Imaging Studies: Nm Hepato W/eject Fract  06/10/2012  *RADIOLOGY REPORT*  Clinical Data:  Upper abdominal pain, diarrhea  NUCLEAR MEDICINE HEPATOBILIARY IMAGING WITH GALLBLADDER EF:  Technique: Sequential images of the abdomen were obtained for 60 minutes following intravenous administration of radiopharmaceutical.  Patient then ingested 8 ounces of commercially available Ensure Plus and imaging was continued for 60 minutes.  A time-activity curve was generated from tracer within the gallbladder following Ensure Plus ingestion, and the gallbladder ejection fraction was calculated.  Radiopharmaceutical:  4.7 mCi Tc-54m mebrofenin  Comparison: None  Findings:  Prompt tracer extraction from bloodstream, indicating normal hepatocellular function. Prompt excretion of tracer into biliary tree. Gallbladder visualized at 11 minutes. Small bowel not visualized until following fatty meal stimulation. No hepatic retention of tracer.  Subjectively normal emptying of tracer from gallbladder following fatty meal stimulation. Calculated gallbladder ejection fraction is 87%, normal. Patient experienced no symptoms following fatty meal ingestion.  IMPRESSION: Normal exam.  Normal values for gallbladder ejection fraction: > 30% for exams utilizing sincalide (CCK) > 33% for exams utilizing fatty meal stimulation with Ensure Plus   Original Report Authenticated By: Lollie Marrow, M.D.

## 2012-07-01 NOTE — Patient Instructions (Addendum)
Please increase your Benefiber to twice per day. If no significant improvement in your abdominal discomfort or stool consistency then start dicyclomine up to three times daily before meals as needed. RX was sent to your pharmacy.  Call with persistent problems.   Office visit for follow-up with Dr. Jena Gauss in four months.

## 2012-07-01 NOTE — Progress Notes (Signed)
Faxed to PCP

## 2012-07-03 NOTE — Progress Notes (Signed)
REVIEWED.  

## 2012-07-05 DIAGNOSIS — J329 Chronic sinusitis, unspecified: Secondary | ICD-10-CM | POA: Diagnosis not present

## 2012-07-07 ENCOUNTER — Other Ambulatory Visit: Payer: Self-pay | Admitting: Family Medicine

## 2012-07-07 ENCOUNTER — Telehealth: Payer: Self-pay | Admitting: Family Medicine

## 2012-07-07 DIAGNOSIS — H9319 Tinnitus, unspecified ear: Secondary | ICD-10-CM

## 2012-07-07 NOTE — Telephone Encounter (Signed)
pls advise ok and refer to ENT in [facility] dr Andrey Campanile for Buzzing in ears, referral entered

## 2012-07-07 NOTE — Telephone Encounter (Signed)
She has appointment with Dr. Andrey Campanile on Wednesday so the daughter stated just to forget the CT scan

## 2012-07-07 NOTE — Telephone Encounter (Signed)
Noted, please let the family/patient  know if the scan is already scheduled, then they need to call and cancel it, thanks

## 2012-07-09 DIAGNOSIS — J328 Other chronic sinusitis: Secondary | ICD-10-CM | POA: Diagnosis not present

## 2012-07-09 DIAGNOSIS — J310 Chronic rhinitis: Secondary | ICD-10-CM | POA: Diagnosis not present

## 2012-07-15 ENCOUNTER — Telehealth: Payer: Self-pay | Admitting: Family Medicine

## 2012-07-16 DIAGNOSIS — M19019 Primary osteoarthritis, unspecified shoulder: Secondary | ICD-10-CM | POA: Diagnosis not present

## 2012-07-16 DIAGNOSIS — M75110 Incomplete rotator cuff tear or rupture of unspecified shoulder, not specified as traumatic: Secondary | ICD-10-CM | POA: Diagnosis not present

## 2012-07-17 NOTE — Telephone Encounter (Signed)
Can this be ordered?

## 2012-07-18 DIAGNOSIS — J328 Other chronic sinusitis: Secondary | ICD-10-CM | POA: Diagnosis not present

## 2012-07-18 DIAGNOSIS — J329 Chronic sinusitis, unspecified: Secondary | ICD-10-CM | POA: Diagnosis not present

## 2012-07-18 NOTE — Telephone Encounter (Signed)
Will address the need for CT of neck a next visit, what is the concern why this is being requested?Plls get further details

## 2012-07-24 ENCOUNTER — Other Ambulatory Visit: Payer: Self-pay | Admitting: Family Medicine

## 2012-07-24 ENCOUNTER — Ambulatory Visit: Payer: Medicare Other | Admitting: Family Medicine

## 2012-07-25 DIAGNOSIS — M719 Bursopathy, unspecified: Secondary | ICD-10-CM | POA: Diagnosis not present

## 2012-07-28 ENCOUNTER — Encounter: Payer: Self-pay | Admitting: Family Medicine

## 2012-07-28 ENCOUNTER — Ambulatory Visit (INDEPENDENT_AMBULATORY_CARE_PROVIDER_SITE_OTHER): Payer: Medicare Other | Admitting: Family Medicine

## 2012-07-28 VITALS — BP 130/60 | HR 80 | Resp 15 | Ht 63.0 in | Wt 122.4 lb

## 2012-07-28 DIAGNOSIS — M542 Cervicalgia: Secondary | ICD-10-CM

## 2012-07-28 DIAGNOSIS — R109 Unspecified abdominal pain: Secondary | ICD-10-CM | POA: Diagnosis not present

## 2012-07-28 DIAGNOSIS — K219 Gastro-esophageal reflux disease without esophagitis: Secondary | ICD-10-CM

## 2012-07-28 DIAGNOSIS — Z139 Encounter for screening, unspecified: Secondary | ICD-10-CM | POA: Diagnosis not present

## 2012-07-28 DIAGNOSIS — Z23 Encounter for immunization: Secondary | ICD-10-CM

## 2012-07-28 DIAGNOSIS — E785 Hyperlipidemia, unspecified: Secondary | ICD-10-CM | POA: Diagnosis not present

## 2012-07-28 DIAGNOSIS — R5383 Other fatigue: Secondary | ICD-10-CM | POA: Diagnosis not present

## 2012-07-28 DIAGNOSIS — R5381 Other malaise: Secondary | ICD-10-CM

## 2012-07-28 NOTE — Telephone Encounter (Signed)
Patient has appt scheduled for today 10/7

## 2012-07-28 NOTE — Progress Notes (Signed)
  Subjective:    Patient ID: Kara [name], female    DOB: [birth year], 78 y.o.   MRN: [id number]  HPI The PT is here for follow up and re-evaluation of chronic medical conditions, medication management and review of any available recent lab and radiology data.  Preventive health is updated, specifically  Cancer screening and Immunization.   Questions or concerns regarding consultations or procedures which the PT has had in the interim are  Addressed.She in her own words has been "very busy" seeing Doctors. She i being evaluated by ENT for tinnitus , recently had a scan of neck and brain. She is seeing a pain specialist for shoulder pain and had imaging studies of them recently. Has been seen by gI , had scan of adomen, no significant pathology, states the med she was given is not helping. Only abn is moderate sized hiatal hernia which she may need to consider having repaired The PT denies any adverse reactions to current medications since the last visit.  Requests pelvic and breast exam at next visit Requests flu vaccine      Review of Systems See HPI Denies recent fever or chills. Chronic sinus pressure, nasal congestion, .and tinnitus, beng evaluated by ENt Denies chest congestion, productive cough or wheezing. Denies chest pains, palpitations and leg swelling    Denies dysuria, frequency, hesitancy or incontinence.   Denies headaches, seizures, numbness, or tingling. Denies depression, anxiety or insomnia. Denies skin break down or rash.        Objective:   Physical Exam Patient alert and oriented and in no cardiopulmonary distress.  HEENT: No facial asymmetry, EOMI, no sinus tenderness,  oropharynx pink and moist.  Neck decreased ROM no adenopathy.  Chest: Clear to auscultation bilaterally.  CVS: S1, S2 no murmurs, no S3.  ABD: Soft non tender. Bowel sounds normal.  Ext: No edema  MS:  Though reduced ROM spine, shoulders, hips and knees.  Skin: Intact, no  ulcerations or rash noted.  Psych: Good eye contact, normal affect. Memory intact not anxious or depressed appearing.  CNS: CN 2-12 intact, power, tone and sensation normal throughout.        Assessment & Plan:

## 2012-07-28 NOTE — Assessment & Plan Note (Signed)
Reports improvement has recently had full GI eval, only structural abnormality is hiatal hernia

## 2012-07-28 NOTE — Assessment & Plan Note (Signed)
Stable takes omeprazole for this with good rsult. States she was recently told she has IBS but she does not acept this

## 2012-07-28 NOTE — Assessment & Plan Note (Signed)
Chronic and ongoing, recent imaging studies by ENT

## 2012-07-28 NOTE — Patient Instructions (Addendum)
Pelvic and breast exam in December  Flu vaccine today  Please check your pharmacy for the shingles  Vaccine  Fasting lipid, tSH and vit d next week on 08/05/2012

## 2012-07-28 NOTE — Assessment & Plan Note (Signed)
Hyperlipidemia:Low fat diet discussed and encouraged.  Updated labs needed 

## 2012-07-31 DIAGNOSIS — J310 Chronic rhinitis: Secondary | ICD-10-CM | POA: Diagnosis not present

## 2012-07-31 DIAGNOSIS — H9319 Tinnitus, unspecified ear: Secondary | ICD-10-CM | POA: Diagnosis not present

## 2012-07-31 DIAGNOSIS — J329 Chronic sinusitis, unspecified: Secondary | ICD-10-CM | POA: Diagnosis not present

## 2012-07-31 DIAGNOSIS — H9209 Otalgia, unspecified ear: Secondary | ICD-10-CM | POA: Diagnosis not present

## 2012-07-31 DIAGNOSIS — G9001 Carotid sinus syncope: Secondary | ICD-10-CM | POA: Diagnosis not present

## 2012-08-05 DIAGNOSIS — R5381 Other malaise: Secondary | ICD-10-CM | POA: Diagnosis not present

## 2012-08-05 DIAGNOSIS — Z961 Presence of intraocular lens: Secondary | ICD-10-CM | POA: Diagnosis not present

## 2012-08-05 DIAGNOSIS — E785 Hyperlipidemia, unspecified: Secondary | ICD-10-CM | POA: Diagnosis not present

## 2012-08-05 DIAGNOSIS — H353190 Nonexudative age-related macular degeneration, unspecified eye, stage unspecified: Secondary | ICD-10-CM | POA: Diagnosis not present

## 2012-08-05 DIAGNOSIS — H31019 Macula scars of posterior pole (postinflammatory) (post-traumatic), unspecified eye: Secondary | ICD-10-CM | POA: Diagnosis not present

## 2012-08-05 LAB — LIPID PANEL
Cholesterol: 181 mg/dL (ref 0–200)
HDL: 63 mg/dL (ref >39)
Triglycerides: 133 mg/dL (ref <150)
VLDL: 27 mg/dL (ref 0–40)

## 2012-08-08 ENCOUNTER — Telehealth: Payer: Self-pay | Admitting: Family Medicine

## 2012-08-08 NOTE — Telephone Encounter (Signed)
Request for pa called in to pharmacy.

## 2012-08-14 ENCOUNTER — Telehealth: Payer: Self-pay | Admitting: Family Medicine

## 2012-08-15 ENCOUNTER — Other Ambulatory Visit: Payer: Self-pay

## 2012-08-15 NOTE — Telephone Encounter (Signed)
PA completed.

## 2012-08-25 DIAGNOSIS — H903 Sensorineural hearing loss, bilateral: Secondary | ICD-10-CM | POA: Diagnosis not present

## 2012-08-25 DIAGNOSIS — J310 Chronic rhinitis: Secondary | ICD-10-CM | POA: Diagnosis not present

## 2012-08-25 DIAGNOSIS — H9319 Tinnitus, unspecified ear: Secondary | ICD-10-CM | POA: Diagnosis not present

## 2012-10-03 ENCOUNTER — Encounter: Payer: Self-pay | Admitting: *Deleted

## 2012-10-09 ENCOUNTER — Encounter: Payer: Self-pay | Admitting: Family Medicine

## 2012-10-09 ENCOUNTER — Ambulatory Visit (INDEPENDENT_AMBULATORY_CARE_PROVIDER_SITE_OTHER): Payer: Medicare Other | Admitting: Family Medicine

## 2012-10-09 VITALS — BP 140/60 | HR 83 | Resp 16 | Ht 63.0 in | Wt 123.1 lb

## 2012-10-09 DIAGNOSIS — Z0000 Encounter for general adult medical examination without abnormal findings: Secondary | ICD-10-CM

## 2012-10-09 DIAGNOSIS — Z01419 Encounter for gynecological examination (general) (routine) without abnormal findings: Secondary | ICD-10-CM

## 2012-10-09 DIAGNOSIS — E785 Hyperlipidemia, unspecified: Secondary | ICD-10-CM

## 2012-10-09 DIAGNOSIS — M25512 Pain in left shoulder: Secondary | ICD-10-CM

## 2012-10-09 DIAGNOSIS — M25519 Pain in unspecified shoulder: Secondary | ICD-10-CM

## 2012-10-09 NOTE — Progress Notes (Signed)
  Subjective:    Patient ID: Kara [name], female    DOB: June 09, 1927, 78 y.o.   MRN: [id number]  HPI  Pt in for pelvic and breast exam. Based on age and history , no pap will be done. C/O generalized stiffness in joints, moreso in the left shoulder. She states she had an MRI ordered by pain specialist and has heard nothing of the result, has questions  Review of Systems See HPI Denies recent fever or chills. Denies sinus pressure, increased  nasal congestion,denies  ear pain or sore throat.Has some post nasal drainage and tickle Denies chest congestion, productive cough or wheezing. Denies chest pains, palpitations and leg swelling Denies abdominal pain, nausea, vomiting,diarrhea or constipation.   Denies dysuria, frequency, hesitancy or incontinence.  Denies headaches, seizures, numbness, or tingling. Denies depression, anxiety or insomnia. Denies skin break down or rash.        Objective:   Physical Exam Pleasant well nourished female, alert and oriented x 3, in no cardio-pulmonary distress. Afebrile. HEENT No facial trauma or asymetry. Sinuses non tender.  EOMI,  External ears normal, tympanic membranes clear. Oropharynx moist, no exudate, poor  dentition. Neck: decreased though adequate ROM, no adenopathy,JVD or thyromegaly.No bruits.  Chest: Clear to ascultation bilaterally.No crackles or wheezes. Non tender to palpation  Breast: No asymetry,no masses. No nipple discharge or inversion. No axillary or supraclavicular adenopathy  Cardiovascular system; Heart sounds normal,  S1 and  S2 ,no S3.  No murmur, or thrill. Apical beat not displaced Peripheral pulses normal.  Abdomen: Soft, non tender, no organomegaly or masses. No bruits. Bowel sounds normal. No guarding, tenderness or rebound.  Rectal:  No mass. Guaiac negative stool.  GU: External genitalia normal. No lesions. Vaginal canal atrophic.phsiologic dischargereduced Uterus small, no adnexal  masses, no cervical motion or adnexal tenderness.  Musculoskeletal exam: Decreased  ROM of spine, hips , shoulders and knees. No deformity ,swelling or crepitus noted. No muscle wasting or atrophy.   Neurologic: Cranial nerves 2 to 12 intact. Power, tone ,sensation and reflexes normal throughout. No disturbance in gait. No tremor.  Skin: Intact, no ulceration, erythema , scaling or rash noted. Pigmentation normal throughout  Psych; Normal mood and affect. Judgement and concentration normal        Assessment & Plan:

## 2012-10-09 NOTE — Patient Instructions (Addendum)
Annual exam in 4.5 month  Call if you need me before  Fasting lipid and hepatic in 4.5 month  No med changes at this  time

## 2012-10-10 ENCOUNTER — Telehealth: Payer: Self-pay | Admitting: Family Medicine

## 2012-10-10 DIAGNOSIS — Z0000 Encounter for general adult medical examination without abnormal findings: Secondary | ICD-10-CM | POA: Insufficient documentation

## 2012-10-10 DIAGNOSIS — M25512 Pain in left shoulder: Secondary | ICD-10-CM

## 2012-10-10 HISTORY — DX: Pain in left shoulder: M25.512

## 2012-10-10 NOTE — Assessment & Plan Note (Signed)
Pelvic and breast exam completed and documented in record No pap sent, not indicated. Pt encouraged to remain active with caution as far as falls are concerned, also encouraged to follow a healthy diet primarily rich in vegetable and fruit

## 2012-10-10 NOTE — Telephone Encounter (Signed)
Please let pt know I have reviewed the report of her MRI, states she has a small 'tear' in a part of the shoulder, no fracture, the tendons are intact . If has excessive pain physical therapy may help. She should call dr Eduard Clos also if the injection helped her and feels she needs another

## 2012-10-13 NOTE — Telephone Encounter (Signed)
I think it was the right, don't have access to the report, she should also contact the ordering MD, Bethea for more details on this . I will refer her for PT if she wishes

## 2012-10-13 NOTE — Telephone Encounter (Signed)
She wants to know which shoulder you were referring to because she had imaging on both of them. She states she will consider the PT or injections and get back in touch

## 2012-10-24 NOTE — Telephone Encounter (Signed)
Noted  

## 2012-11-25 ENCOUNTER — Other Ambulatory Visit: Payer: Self-pay | Admitting: Family Medicine

## 2012-12-05 ENCOUNTER — Ambulatory Visit: Payer: Medicare Other | Admitting: Internal Medicine

## 2013-01-01 DIAGNOSIS — L570 Actinic keratosis: Secondary | ICD-10-CM | POA: Diagnosis not present

## 2013-01-02 ENCOUNTER — Encounter: Payer: Self-pay | Admitting: Internal Medicine

## 2013-01-02 ENCOUNTER — Ambulatory Visit (INDEPENDENT_AMBULATORY_CARE_PROVIDER_SITE_OTHER): Payer: Medicare Other | Admitting: Internal Medicine

## 2013-01-02 VITALS — BP 146/65 | HR 70 | Temp 97.0°F | Ht 63.0 in | Wt 123.8 lb

## 2013-01-02 DIAGNOSIS — K219 Gastro-esophageal reflux disease without esophagitis: Secondary | ICD-10-CM | POA: Diagnosis not present

## 2013-01-02 DIAGNOSIS — K589 Irritable bowel syndrome without diarrhea: Secondary | ICD-10-CM

## 2013-01-02 NOTE — Patient Instructions (Signed)
Continue Bentyl daily  Continue Omeprazole daily - discussed - benefits outweigh the risks  Office visit in 1 year
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
## 2013-01-08 ENCOUNTER — Emergency Department (HOSPITAL_COMMUNITY)
Admission: EM | Admit: 2013-01-08 | Discharge: 2013-01-09 | Disposition: A | Discharge status: Home / Self Care | Payer: Medicare Other | Attending: Emergency Medicine | Admitting: Emergency Medicine

## 2013-01-08 ENCOUNTER — Encounter (HOSPITAL_COMMUNITY): Payer: Self-pay | Admitting: *Deleted

## 2013-01-08 ENCOUNTER — Telehealth: Payer: Self-pay | Admitting: Family Medicine

## 2013-01-08 DIAGNOSIS — Z8719 Personal history of other diseases of the digestive system: Secondary | ICD-10-CM | POA: Insufficient documentation

## 2013-01-08 DIAGNOSIS — F411 Generalized anxiety disorder: Secondary | ICD-10-CM | POA: Insufficient documentation

## 2013-01-08 DIAGNOSIS — B9789 Other viral agents as the cause of diseases classified elsewhere: Secondary | ICD-10-CM | POA: Diagnosis not present

## 2013-01-08 DIAGNOSIS — Z79899 Other long term (current) drug therapy: Secondary | ICD-10-CM | POA: Diagnosis not present

## 2013-01-08 DIAGNOSIS — R51 Headache: Secondary | ICD-10-CM | POA: Diagnosis not present

## 2013-01-08 DIAGNOSIS — Z85038 Personal history of other malignant neoplasm of large intestine: Secondary | ICD-10-CM | POA: Insufficient documentation

## 2013-01-08 DIAGNOSIS — E785 Hyperlipidemia, unspecified: Secondary | ICD-10-CM | POA: Insufficient documentation

## 2013-01-08 DIAGNOSIS — Z7982 Long term (current) use of aspirin: Secondary | ICD-10-CM | POA: Diagnosis not present

## 2013-01-08 DIAGNOSIS — K219 Gastro-esophageal reflux disease without esophagitis: Secondary | ICD-10-CM | POA: Insufficient documentation

## 2013-01-08 DIAGNOSIS — G8929 Other chronic pain: Secondary | ICD-10-CM | POA: Diagnosis not present

## 2013-01-08 DIAGNOSIS — R110 Nausea: Secondary | ICD-10-CM | POA: Insufficient documentation

## 2013-01-08 DIAGNOSIS — B338 Other specified viral diseases: Secondary | ICD-10-CM | POA: Diagnosis not present

## 2013-01-08 DIAGNOSIS — Z8739 Personal history of other diseases of the musculoskeletal system and connective tissue: Secondary | ICD-10-CM | POA: Diagnosis not present

## 2013-01-08 DIAGNOSIS — J3489 Other specified disorders of nose and nasal sinuses: Secondary | ICD-10-CM | POA: Insufficient documentation

## 2013-01-08 MED ORDER — IBUPROFEN 800 MG PO TABS
800.0000 mg | ORAL_TABLET | Freq: Once | ORAL | Status: AC
  Administered 2013-01-08: 800 mg via ORAL
  Filled 2013-01-08: qty 1

## 2013-01-08 MED ORDER — ONDANSETRON 4 MG PO TBDP: 4.0000 mg | ORAL_TABLET | Freq: Three times a day (TID) | ORAL | Status: DC | PRN

## 2013-01-08 MED ORDER — OXYMETAZOLINE HCL 0.05 % NA SOLN
1.0000 | Freq: Once | NASAL | Status: AC
  Administered 2013-01-08: 1 via NASAL
  Filled 2013-01-08: qty 15

## 2013-01-08 MED ORDER — DIPHENHYDRAMINE HCL 25 MG PO CAPS
ORAL_CAPSULE | ORAL | Status: AC
  Filled 2013-01-08: qty 1

## 2013-01-08 MED ORDER — DIPHENHYDRAMINE HCL 25 MG PO CAPS
25.0000 mg | ORAL_CAPSULE | Freq: Once | ORAL | Status: AC
  Administered 2013-01-08: 25 mg via ORAL

## 2013-01-08 MED ORDER — LORATADINE 10 MG PO TABS
10.0000 mg | ORAL_TABLET | Freq: Once | ORAL | Status: AC
  Administered 2013-01-08: 10 mg via ORAL
  Filled 2013-01-08: qty 1

## 2013-01-08 MED ORDER — ONDANSETRON 8 MG PO TBDP
8.0000 mg | ORAL_TABLET | Freq: Once | ORAL | Status: AC
  Administered 2013-01-08: 8 mg via ORAL
  Filled 2013-01-08: qty 1
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
## 2013-01-08 NOTE — Telephone Encounter (Signed)
Patient aware.

## 2013-01-08 NOTE — ED Notes (Addendum)
Ears " buzzing", headache, pressure in ears.  Upper abd pain,- took a "nerve pill" and abd feels better.

## 2013-01-08 NOTE — ED Notes (Signed)
Patient complained of skin on chest back sting. Feels like throat closing up.

## 2013-01-08 NOTE — Telephone Encounter (Signed)
Ensure and doc any uti stymptoms. Advise reduce benadryl to alternate days as may be making her sleepy/fatigued. Call bacjk next week if still symptomatic for CBc to be checked

## 2013-01-08 NOTE — Telephone Encounter (Signed)
Patient states that she feels fatigued.  No cough, no congestion.  Just feels drained within the last 24 hours.  Is taking benadryl for allergies.  Please advise.

## 2013-01-09 ENCOUNTER — Ambulatory Visit (INDEPENDENT_AMBULATORY_CARE_PROVIDER_SITE_OTHER): Payer: Medicare Other | Admitting: Family Medicine

## 2013-01-09 ENCOUNTER — Encounter: Payer: Self-pay | Admitting: Family Medicine

## 2013-01-09 ENCOUNTER — Ambulatory Visit (HOSPITAL_COMMUNITY)
Admission: RE | Admit: 2013-01-09 | Discharge: 2013-01-09 | Disposition: A | Discharge status: Home / Self Care | Payer: Medicare Other | Source: Ambulatory Visit | Attending: Family Medicine | Admitting: Family Medicine

## 2013-01-09 VITALS — BP 128/62 | HR 85 | Temp 98.1°F | Resp 16 | Ht 63.0 in | Wt 122.4 lb

## 2013-01-09 DIAGNOSIS — J309 Allergic rhinitis, unspecified: Secondary | ICD-10-CM | POA: Diagnosis not present

## 2013-01-09 DIAGNOSIS — J40 Bronchitis, not specified as acute or chronic: Secondary | ICD-10-CM | POA: Diagnosis not present

## 2013-01-09 DIAGNOSIS — E785 Hyperlipidemia, unspecified: Secondary | ICD-10-CM

## 2013-01-09 DIAGNOSIS — R109 Unspecified abdominal pain: Secondary | ICD-10-CM

## 2013-01-09 DIAGNOSIS — J0110 Acute frontal sinusitis, unspecified: Secondary | ICD-10-CM | POA: Diagnosis not present

## 2013-01-09 LAB — CBC WITH DIFFERENTIAL/PLATELET
Basophils Relative: 0 % (ref 0–1)
HCT: 37 % (ref 36.0–46.0)
Hemoglobin: 12.3 g/dL (ref 12.0–15.0)
Lymphocytes Relative: 45 % (ref 12–46)
Lymphs Abs: 2.5 10*3/uL (ref 0.7–4.0)
MCHC: 33.2 g/dL (ref 30.0–36.0)
Monocytes Relative: 9 % (ref 3–12)
Neutro Abs: 2.3 10*3/uL (ref 1.7–7.7)
Neutrophils Relative %: 43 % (ref 43–77)
RBC: 3.89 MIL/uL (ref 3.87–5.11)
WBC: 5.4 10*3/uL (ref 4.0–10.5)

## 2013-01-09 LAB — BASIC METABOLIC PANEL
Calcium: 10 mg/dL (ref 8.4–10.5)
Creat: 0.73 mg/dL (ref 0.50–1.10)
Sodium: 140 mEq/L (ref 135–145)

## 2013-01-09 MED ORDER — PREDNISOLONE 5 MG PO TABS: ORAL_TABLET | ORAL | Status: AC

## 2013-01-09 MED ORDER — LEVOFLOXACIN 500 MG PO TABS: 500.0000 mg | ORAL_TABLET | Freq: Every day | ORAL | Status: AC

## 2013-01-09 NOTE — ED Notes (Signed)
Pt alert & oriented x4, stable gait. Patient given discharge instructions, paperwork & prescription(s). Patient carried out via wheelchair. Patient  verbalized understanding. Pt left department w/ no further questions.

## 2013-01-09 NOTE — ED Notes (Signed)
Family states pt started shaking & was cold. Additional blanket provided.

## 2013-01-09 NOTE — Patient Instructions (Addendum)
F/u in 3 weeks  You are being treated for allergies , uncontrolled, sinsuitis and will go to get xray of you abdomen and chest since you are so fatigued and have abdominal pain and cough.  Levaquin and prednisone is sent to your pharmacy  CBC and diff stat and chem 7 stat today.Results will be called to your daughter this pm

## 2013-01-09 NOTE — Progress Notes (Signed)
  Subjective:    Patient ID: Kara [name], female    DOB: [birth year], 79 y.o.   MRN: [id number]  HPI gerneralized weakness, and drained x 4 days, head congested, throat hurts, sinuses hurt ears hurt, intermittent chills , no fever Prior to this had been at baseline. Seen in eD 1 day ago, no better   Review of Systems See HPI  Denies chest pains, palpitations and leg swelling Denies  nausea, vomiting,diarrhea or constipation.   Denies dysuria, frequency, hesitancy or incontinence. Denies joint pain, swelling and limitation in mobility. Denies headaches, seizures, numbness, or tingling. Denies depression, anxiety or insomnia. Denies skin break down or rash.        Objective:   Physical Exam  Patient alert and oriented and in no cardiopulmonary distress.Ill appearing, weak  HEENT: No facial asymmetry, EOMI, frontal sinus tenderness,  oropharynx pink and moist.  Neck adequate ROM left anterior cervical adenopathy.Left TM dull and slightly erythematous  Chest: Clear to auscultation bilaterally.  CVS: S1, S2 no murmurs, no S3.  ABD: Soft bilateral lower quadrant tenderness with guarding on left side.Pt has diverticular disease  Ext: No edema  MS: Adequate ROM spine, shoulders, hips and knees.  Skin: Intact, no ulcerations or rash noted.  Psych: Good eye contact, . Memory intact anxious  appearing.  CNS: CN 2-12 intact, power, normal throughout.       Assessment & Plan:

## 2013-01-09 NOTE — ED Notes (Signed)
Pt & family member provided a drink. Pt shaking has stopped.

## 2013-01-10 NOTE — Assessment & Plan Note (Signed)
Abdominal[pain wth bilateral lower abdominal tenderness , obtain abdominal  xray to eval for acute abdomen

## 2013-01-10 NOTE — Assessment & Plan Note (Signed)
Controlled, no change in medication  

## 2013-01-10 NOTE — Assessment & Plan Note (Signed)
Acute sinus pressure antibiotic prescribed

## 2013-01-10 NOTE — Assessment & Plan Note (Signed)
Uncontrolled, short course of prednisone

## 2013-01-10 NOTE — Assessment & Plan Note (Signed)
New onset cough with fatigue , cxr to r/o infection

## 2013-01-16 DIAGNOSIS — J3089 Other allergic rhinitis: Secondary | ICD-10-CM | POA: Diagnosis not present

## 2013-01-16 DIAGNOSIS — H9319 Tinnitus, unspecified ear: Secondary | ICD-10-CM | POA: Diagnosis not present

## 2013-01-16 DIAGNOSIS — H903 Sensorineural hearing loss, bilateral: Secondary | ICD-10-CM | POA: Diagnosis not present

## 2013-01-16 DIAGNOSIS — H9209 Otalgia, unspecified ear: Secondary | ICD-10-CM | POA: Diagnosis not present

## 2013-01-27 ENCOUNTER — Telehealth: Payer: Self-pay | Admitting: Family Medicine

## 2013-01-27 ENCOUNTER — Other Ambulatory Visit: Payer: Self-pay | Admitting: Family Medicine

## 2013-01-27 NOTE — Telephone Encounter (Signed)
Patient aware.

## 2013-01-27 NOTE — Telephone Encounter (Signed)
pls find out if any laceration or bleeding, any memory impairment or swelling on the head,or new hEADACHE. iF NONE  CT scan not necessary if no significant pain or memory impairment. Explain that there is danger in radiation exposure  pLEASE LET ME KNOW RESPONSE

## 2013-02-04 ENCOUNTER — Other Ambulatory Visit: Payer: Self-pay | Admitting: Family Medicine

## 2013-02-05 ENCOUNTER — Other Ambulatory Visit: Payer: Self-pay

## 2013-02-05 ENCOUNTER — Telehealth: Payer: Self-pay | Admitting: Family Medicine

## 2013-02-05 MED ORDER — CLONAZEPAM 0.5 MG PO TABS: 0.2500 mg | ORAL_TABLET | Freq: Every day | ORAL | Status: DC | PRN

## 2013-02-05 NOTE — Telephone Encounter (Signed)
Med refilled and awaiting signature  

## 2013-02-10 ENCOUNTER — Encounter: Payer: Self-pay | Admitting: Family Medicine

## 2013-02-10 ENCOUNTER — Ambulatory Visit (INDEPENDENT_AMBULATORY_CARE_PROVIDER_SITE_OTHER): Payer: Medicare Other | Admitting: Family Medicine

## 2013-02-10 VITALS — BP 126/72 | HR 66 | Resp 18 | Ht 64.0 in | Wt 121.0 lb

## 2013-02-10 DIAGNOSIS — N3000 Acute cystitis without hematuria: Secondary | ICD-10-CM | POA: Diagnosis not present

## 2013-02-10 DIAGNOSIS — R531 Weakness: Secondary | ICD-10-CM

## 2013-02-10 DIAGNOSIS — R5383 Other fatigue: Secondary | ICD-10-CM | POA: Diagnosis not present

## 2013-02-10 DIAGNOSIS — R5381 Other malaise: Secondary | ICD-10-CM | POA: Diagnosis not present

## 2013-02-10 LAB — POCT URINALYSIS DIPSTICK
Glucose, UA: NEGATIVE
Ketones, UA: NEGATIVE
Protein, UA: NEGATIVE

## 2013-02-10 LAB — GLUCOSE, POCT (MANUAL RESULT ENTRY): POC Glucose: 115 mg/dl — AB (ref 70–99)

## 2013-02-10 MED ORDER — CIPROFLOXACIN HCL 500 MG PO TABS: 500.0000 mg | ORAL_TABLET | Freq: Two times a day (BID) | ORAL | Status: AC

## 2013-02-10 NOTE — Assessment & Plan Note (Signed)
Very benign exam, CBG normal, no orthostatic hypotension  Likley multifactorial,pt had not had lunch, mild UTI noted treated per above No further work up, labs normal 3 weeks ago

## 2013-02-10 NOTE — Assessment & Plan Note (Signed)
cipro BID, fluids, cranberry juice

## 2013-02-10 NOTE — Progress Notes (Signed)
  Subjective:    Patient ID: Kara [name], female    DOB: 12/27/1926, 79 y.o.   MRN: [id number]  HPI  Pt here due to episode of weakness and fatigue around lunch. She has not had any food, she had OJ, cookies and felt better an hour later. No CP, SOB, Syncope. Also had had urinary frequency and some dysuria today. No fever. No recent change to medication. No history of HTN or DM  CBG 115 in office  Review of Systems  GEN- + fatigue, fever, weight loss,weakness, recent illness HEENT- denies eye drainage, change in vision, nasal discharge, CVS- denies chest pain, palpitations RESP- denies SOB, cough, wheeze ABD- denies N/V, change in stools, abd pain GU- + dysuria,denies  hematuria, dribbling, incontinence MSK- denies joint pain, muscle aches, injury Neuro- denies headache, dizziness, syncope, seizure activity      Objective:   Physical Exam  GEN- NAD, alert and oriented x3 HEENT- PERRL, EOMI, non injected sclera, pink conjunctiva, MMM, oropharynx clear, TM clear bilat Neck- Supple, no LAD CVS- RRR, no murmur RESP-CTAB ABD-NABS,soft,NT,ND EXT- No edema Pulses- Radial, DP- 2+ NEURO- CNII-XII in tact, no focal deficits, moving all 3 ext       Assessment & Plan:

## 2013-02-10 NOTE — Patient Instructions (Addendum)
Eat small snacks through the day  Continue current medications Stay hydrated Increase fluid intake Antibiotics for urine infection F/U Dr. Lodema Hong as previous

## 2013-02-22 DIAGNOSIS — N390 Urinary tract infection, site not specified: Secondary | ICD-10-CM | POA: Diagnosis not present

## 2013-02-22 DIAGNOSIS — R109 Unspecified abdominal pain: Secondary | ICD-10-CM | POA: Diagnosis not present

## 2013-02-23 ENCOUNTER — Telehealth: Payer: Self-pay | Admitting: Family Medicine

## 2013-02-24 NOTE — Telephone Encounter (Signed)
Called pt- no answer. No voicemail to leave message. If she calls back, only needs to be seen if still having symptoms.

## 2013-02-25 DIAGNOSIS — E785 Hyperlipidemia, unspecified: Secondary | ICD-10-CM | POA: Diagnosis not present

## 2013-02-25 LAB — HEPATIC FUNCTION PANEL
ALT: 12 U/L (ref 0–35)
AST: 18 U/L (ref 0–37)
Bilirubin, Direct: 0.1 mg/dL (ref 0.0–0.3)
Total Protein: 7.2 g/dL (ref 6.0–8.3)

## 2013-02-25 LAB — LIPID PANEL
Cholesterol: 167 mg/dL (ref 0–200)
LDL Cholesterol: 78 mg/dL (ref 0–99)
VLDL: 34 mg/dL (ref 0–40)

## 2013-02-26 ENCOUNTER — Telehealth: Payer: Self-pay | Admitting: Family Medicine

## 2013-02-26 NOTE — Telephone Encounter (Signed)
Lays around and feels bad still with some stomach pain. Still on antibiotics for UTI, Wanted to come in for appt. Luann to schedule

## 2013-02-26 NOTE — Telephone Encounter (Signed)
See previous message

## 2013-02-27 ENCOUNTER — Other Ambulatory Visit: Payer: Self-pay | Admitting: Family Medicine

## 2013-02-27 ENCOUNTER — Ambulatory Visit: Payer: Medicare Other | Admitting: Family Medicine

## 2013-02-27 ENCOUNTER — Telehealth: Payer: Self-pay | Admitting: Family Medicine

## 2013-02-27 DIAGNOSIS — R0989 Other specified symptoms and signs involving the circulatory and respiratory systems: Secondary | ICD-10-CM

## 2013-02-27 DIAGNOSIS — H9313 Tinnitus, bilateral: Secondary | ICD-10-CM

## 2013-02-27 NOTE — Telephone Encounter (Signed)
I have spoken to daughter,. I have ordered an mRI brain and a carotid doppler, please see if these can be approved and scheduled, she is expecting a call

## 2013-03-01 ENCOUNTER — Emergency Department (HOSPITAL_COMMUNITY): Payer: Medicare Other

## 2013-03-01 ENCOUNTER — Encounter (HOSPITAL_COMMUNITY): Payer: Self-pay | Admitting: Emergency Medicine

## 2013-03-01 ENCOUNTER — Emergency Department (HOSPITAL_COMMUNITY)
Admission: EM | Admit: 2013-03-01 | Discharge: 2013-03-01 | Disposition: A | Discharge status: Home / Self Care | Payer: Medicare Other | Attending: Emergency Medicine | Admitting: Emergency Medicine

## 2013-03-01 DIAGNOSIS — Z8739 Personal history of other diseases of the musculoskeletal system and connective tissue: Secondary | ICD-10-CM | POA: Insufficient documentation

## 2013-03-01 DIAGNOSIS — Z860100 Personal history of colon polyps, unspecified: Secondary | ICD-10-CM | POA: Insufficient documentation

## 2013-03-01 DIAGNOSIS — K219 Gastro-esophageal reflux disease without esophagitis: Secondary | ICD-10-CM | POA: Diagnosis not present

## 2013-03-01 DIAGNOSIS — M810 Age-related osteoporosis without current pathological fracture: Secondary | ICD-10-CM | POA: Diagnosis not present

## 2013-03-01 DIAGNOSIS — G8929 Other chronic pain: Secondary | ICD-10-CM | POA: Insufficient documentation

## 2013-03-01 DIAGNOSIS — Z8669 Personal history of other diseases of the nervous system and sense organs: Secondary | ICD-10-CM | POA: Diagnosis not present

## 2013-03-01 DIAGNOSIS — Z8719 Personal history of other diseases of the digestive system: Secondary | ICD-10-CM | POA: Diagnosis not present

## 2013-03-01 DIAGNOSIS — Z7982 Long term (current) use of aspirin: Secondary | ICD-10-CM | POA: Diagnosis not present

## 2013-03-01 DIAGNOSIS — R109 Unspecified abdominal pain: Secondary | ICD-10-CM | POA: Insufficient documentation

## 2013-03-01 DIAGNOSIS — Z880 Allergy status to penicillin: Secondary | ICD-10-CM | POA: Diagnosis not present

## 2013-03-01 DIAGNOSIS — K449 Diaphragmatic hernia without obstruction or gangrene: Secondary | ICD-10-CM | POA: Diagnosis not present

## 2013-03-01 DIAGNOSIS — J9819 Other pulmonary collapse: Secondary | ICD-10-CM | POA: Diagnosis not present

## 2013-03-01 DIAGNOSIS — E785 Hyperlipidemia, unspecified: Secondary | ICD-10-CM | POA: Diagnosis not present

## 2013-03-01 DIAGNOSIS — N390 Urinary tract infection, site not specified: Secondary | ICD-10-CM | POA: Diagnosis not present

## 2013-03-01 DIAGNOSIS — F411 Generalized anxiety disorder: Secondary | ICD-10-CM | POA: Diagnosis not present

## 2013-03-01 DIAGNOSIS — Z79899 Other long term (current) drug therapy: Secondary | ICD-10-CM | POA: Diagnosis not present

## 2013-03-01 DIAGNOSIS — N281 Cyst of kidney, acquired: Secondary | ICD-10-CM | POA: Diagnosis not present

## 2013-03-01 DIAGNOSIS — R110 Nausea: Secondary | ICD-10-CM | POA: Insufficient documentation

## 2013-03-01 DIAGNOSIS — K5730 Diverticulosis of large intestine without perforation or abscess without bleeding: Secondary | ICD-10-CM | POA: Diagnosis not present

## 2013-03-01 LAB — TROPONIN I: Troponin I: <0.3 ng/mL (ref <0.30)

## 2013-03-01 LAB — COMPREHENSIVE METABOLIC PANEL
ALT: 29 U/L (ref 0–35)
AST: 41 U/L — ABNORMAL HIGH (ref 0–37)
Albumin: 3.5 g/dL (ref 3.5–5.2)
Alkaline Phosphatase: 67 U/L (ref 39–117)
Chloride: 95 mEq/L — ABNORMAL LOW (ref 96–112)
Potassium: 3.5 mEq/L (ref 3.5–5.1)
Total Bilirubin: 0.5 mg/dL (ref 0.3–1.2)

## 2013-03-01 LAB — CBC WITH DIFFERENTIAL/PLATELET
Basophils Absolute: 0 10*3/uL (ref 0.0–0.1)
Basophils Relative: 0 % (ref 0–1)
Hemoglobin: 12.1 g/dL (ref 12.0–15.0)
MCHC: 34.1 g/dL (ref 30.0–36.0)
Neutro Abs: 8.4 10*3/uL — ABNORMAL HIGH (ref 1.7–7.7)
Neutrophils Relative %: 84 % — ABNORMAL HIGH (ref 43–77)
RDW: 13.6 % (ref 11.5–15.5)
WBC: 10 10*3/uL (ref 4.0–10.5)

## 2013-03-01 LAB — URINALYSIS, ROUTINE W REFLEX MICROSCOPIC
Glucose, UA: NEGATIVE mg/dL
Ketones, ur: 40 mg/dL — AB
Protein, ur: 30 mg/dL — AB

## 2013-03-01 LAB — URINE MICROSCOPIC-ADD ON

## 2013-03-01 MED ORDER — METOCLOPRAMIDE HCL 5 MG/ML IJ SOLN
10.0000 mg | Freq: Once | INTRAMUSCULAR | Status: AC
  Administered 2013-03-01: 10 mg via INTRAVENOUS
  Filled 2013-03-01: qty 2

## 2013-03-01 MED ORDER — MORPHINE SULFATE 4 MG/ML IJ SOLN
4.0000 mg | Freq: Once | INTRAMUSCULAR | Status: AC
  Administered 2013-03-01: 4 mg via INTRAVENOUS
  Filled 2013-03-01: qty 1

## 2013-03-01 MED ORDER — ONDANSETRON HCL 4 MG/2ML IJ SOLN: 4.0000 mg | Freq: Once | INTRAMUSCULAR | Status: DC

## 2013-03-01 MED ORDER — IOHEXOL 300 MG/ML  SOLN
100.0000 mL | Freq: Once | INTRAMUSCULAR | Status: AC | PRN
  Administered 2013-03-01: 100 mL via INTRAVENOUS

## 2013-03-01 MED ORDER — SODIUM CHLORIDE 0.9 % IV SOLN
Freq: Once | INTRAVENOUS | Status: AC
  Administered 2013-03-01: 05:00:00 via INTRAVENOUS

## 2013-03-01 MED ORDER — FLUCONAZOLE 150 MG PO TABS: 150.0000 mg | ORAL_TABLET | Freq: Once | ORAL | Status: DC

## 2013-03-01 MED ORDER — ONDANSETRON HCL 4 MG PO TABS: 4.0000 mg | ORAL_TABLET | Freq: Four times a day (QID) | ORAL | Status: DC | PRN

## 2013-03-01 MED ORDER — IOHEXOL 300 MG/ML  SOLN
50.0000 mL | Freq: Once | INTRAMUSCULAR | Status: AC | PRN
  Administered 2013-03-01: 50 mL via ORAL

## 2013-03-01 MED ORDER — DIPHENHYDRAMINE HCL 50 MG/ML IJ SOLN
25.0000 mg | Freq: Once | INTRAMUSCULAR | Status: AC
  Administered 2013-03-01: 25 mg via INTRAVENOUS
  Filled 2013-03-01: qty 1

## 2013-03-01 MED ORDER — DEXTROSE 5 % IV SOLN
1.0000 g | Freq: Once | INTRAVENOUS | Status: AC
  Administered 2013-03-01: 1 g via INTRAVENOUS

## 2013-03-01 MED ORDER — OXYCODONE-ACETAMINOPHEN 5-325 MG PO TABS: 1.0000 | ORAL_TABLET | ORAL | Status: DC | PRN

## 2013-03-01 MED ORDER — DEXTROSE 5 % IV SOLN
INTRAVENOUS | Status: AC
  Filled 2013-03-01: qty 10

## 2013-03-01 MED ORDER — CEFUROXIME AXETIL 500 MG PO TABS: 500.0000 mg | ORAL_TABLET | Freq: Two times a day (BID) | ORAL | Status: DC

## 2013-03-01 NOTE — ED Notes (Signed)
Pt has been seen at primary care physicians office for a UTI and abdominal pain, was started on macrobid. States she has been taking it for about a week now. Now complains of weakness and nausea.
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
## 2013-03-01 NOTE — ED Notes (Signed)
Patient states she was diagnosed with bladder infection and started on Macrobid on Monday; patient c/o nausea and abdominal pain after starting antibiotic.

## 2013-03-01 NOTE — ED Notes (Signed)
RN called Nursing Supervisor for IV Rocephin not stocked in er pyxis.

## 2013-03-02 ENCOUNTER — Telehealth: Payer: Self-pay | Admitting: Family Medicine

## 2013-03-02 LAB — URINE CULTURE

## 2013-03-02 NOTE — Telephone Encounter (Signed)
Patient can keep appt for Wednesday.

## 2013-03-02 NOTE — Telephone Encounter (Signed)
Noted  

## 2013-03-02 NOTE — Telephone Encounter (Signed)
Confirmed with patient to keep her 5/14 appt and she would receive a call regarding urine culture if necessary once results available.

## 2013-03-02 NOTE — Telephone Encounter (Signed)
Urine cullture not ye ready will call if something new, keep Wednesday appt

## 2013-03-03 ENCOUNTER — Other Ambulatory Visit: Payer: Self-pay | Admitting: Family Medicine

## 2013-03-03 DIAGNOSIS — R51 Headache: Secondary | ICD-10-CM

## 2013-03-04 ENCOUNTER — Ambulatory Visit (HOSPITAL_COMMUNITY): Payer: Medicare Other

## 2013-03-04 ENCOUNTER — Ambulatory Visit (HOSPITAL_COMMUNITY): Admission: RE | Admit: 2013-03-04 | Payer: Medicare Other | Source: Ambulatory Visit

## 2013-03-04 ENCOUNTER — Encounter: Payer: Self-pay | Admitting: Family Medicine

## 2013-03-04 ENCOUNTER — Ambulatory Visit (HOSPITAL_COMMUNITY)
Admission: RE | Admit: 2013-03-04 | Discharge: 2013-03-04 | Disposition: A | Discharge status: Home / Self Care | Payer: Medicare Other | Source: Ambulatory Visit | Attending: Family Medicine | Admitting: Family Medicine

## 2013-03-04 ENCOUNTER — Ambulatory Visit (INDEPENDENT_AMBULATORY_CARE_PROVIDER_SITE_OTHER): Payer: Medicare Other | Admitting: Family Medicine

## 2013-03-04 VITALS — BP 128/72 | HR 84 | Resp 18 | Ht 63.0 in | Wt 118.0 lb

## 2013-03-04 DIAGNOSIS — R5381 Other malaise: Secondary | ICD-10-CM | POA: Diagnosis not present

## 2013-03-04 DIAGNOSIS — E785 Hyperlipidemia, unspecified: Secondary | ICD-10-CM | POA: Diagnosis not present

## 2013-03-04 DIAGNOSIS — I658 Occlusion and stenosis of other precerebral arteries: Secondary | ICD-10-CM | POA: Insufficient documentation

## 2013-03-04 DIAGNOSIS — R5383 Other fatigue: Secondary | ICD-10-CM

## 2013-03-04 DIAGNOSIS — I6529 Occlusion and stenosis of unspecified carotid artery: Secondary | ICD-10-CM | POA: Diagnosis not present

## 2013-03-04 DIAGNOSIS — F411 Generalized anxiety disorder: Secondary | ICD-10-CM | POA: Diagnosis not present

## 2013-03-04 DIAGNOSIS — R0989 Other specified symptoms and signs involving the circulatory and respiratory systems: Secondary | ICD-10-CM | POA: Diagnosis not present

## 2013-03-04 DIAGNOSIS — N3000 Acute cystitis without hematuria: Secondary | ICD-10-CM

## 2013-03-04 DIAGNOSIS — J309 Allergic rhinitis, unspecified: Secondary | ICD-10-CM | POA: Diagnosis not present

## 2013-03-04 DIAGNOSIS — R531 Weakness: Secondary | ICD-10-CM

## 2013-03-04 NOTE — Patient Instructions (Addendum)
F/u in 4 weeks, call if you need me before  You have no urinary tract infection, stop the antibiotic you are taking  Do not take tylenol with codeine  Reschedule your brain MRI if possible since you do not feel "up to it"   cBC and diff in 4 weeks

## 2013-03-04 NOTE — Progress Notes (Signed)
  Subjective:    Patient ID: Kara [name], female    DOB: 1927-04-24, 79 y.o.   MRN: [id number]  HPI Pt has been evaluated approx 3 times with a dx of uTI in the past 4 weeks. The only one c/s available is negative for infection. She c/o nausea and  fatigue with the antibiotic taking, and she was also prescribed a codeine containing product which she  has not used much. Wants to delay the brain scan at this time, based on how she feels but wants to pursue the neck ultrasound   Review of Systems See HPI Denies recent fever or chills.c/o fatigue, and not feeling well with poor appetite in the past 4 weeks Denies sinus pressure, nasal congestion, ear pain or sore throat. Denies chest congestion, productive cough or wheezing. Denies chest pains, palpitations and leg swelling Denies abdominal pain, nausea, vomiting,diarrhea or constipation.   Denies dysuria, frequency, hesitancy or incontinence. Chronic  joint pain,  And mild  limitation in mobility. C/o  Headaches,denies  seizures, numbness, or tingling. Denies depression, has increased anxiety or insomnia. Denies skin break down or rash.        Objective:   Physical Exam Patient alert and oriented and in no cardiopulmonary distress.Anxious  HEENT: No facial asymmetry, EOMI, no sinus tenderness,  oropharynx pink and moist.  Neck supple no adenopathy.  Chest: Clear to auscultation bilaterally.  CVS: S1, S2 no murmurs, no S3.  ABD: Soft non tender. Bowel sounds normal.No suprapubic or renal angle tenderness  Ext: No edema  MS: Adequate ROM spine, shoulders, hips and knees.  Skin: Intact, no ulcerations or rash noted.  Psych: Good eye contact, normal affect. Memory intact ot anxious not  depressed appearing. CNS: CN 2-12 intact, power, tone and sensation normal throughout.        Assessment & Plan:

## 2013-03-06 ENCOUNTER — Other Ambulatory Visit: Payer: Self-pay | Admitting: Family Medicine

## 2013-03-06 ENCOUNTER — Telehealth: Payer: Self-pay

## 2013-03-06 NOTE — Telephone Encounter (Signed)
States she is not taking the antibiotics anymore but her mouth is sore and her lips are burning. I seen where she was given 1 dlflucan in the ED to take but she hadn't took it. I advised her to do so and I would let you know about her symptoms and see if anything else needed to be sent.

## 2013-03-06 NOTE — Telephone Encounter (Signed)
Just the diflucan tablet one is enough. I would have sent in Dulke's mouthwash but she is allergic to one of th ingredients, pls let her know

## 2013-03-09 ENCOUNTER — Telehealth: Payer: Self-pay | Admitting: Family Medicine

## 2013-03-09 NOTE — Assessment & Plan Note (Signed)
Controlled, no change in medication  

## 2013-03-09 NOTE — Assessment & Plan Note (Signed)
Triglycerides elevated Hyperlipidemia:Low fat diet discussed and encouraged.

## 2013-03-09 NOTE — Telephone Encounter (Signed)
Pt feels better now.

## 2013-03-09 NOTE — Assessment & Plan Note (Addendum)
ongoingproblem , labs to further investigate

## 2013-03-09 NOTE — Assessment & Plan Note (Signed)
Labs reviewed, no bacterial growth seen , pt advised to d/c antibiotic

## 2013-03-09 NOTE — Assessment & Plan Note (Signed)
Increased and uncontrolled at this time due to ill health

## 2013-03-09 NOTE — Telephone Encounter (Signed)
She feels like her mouth is better now so she is fine

## 2013-03-10 ENCOUNTER — Other Ambulatory Visit: Payer: Self-pay

## 2013-03-10 ENCOUNTER — Telehealth: Payer: Self-pay | Admitting: Family Medicine

## 2013-03-10 MED ORDER — FLUCONAZOLE 150 MG PO TABS: 150.0000 mg | ORAL_TABLET | Freq: Once | ORAL | Status: DC

## 2013-03-10 NOTE — Telephone Encounter (Signed)
Wants another diflucan called in

## 2013-03-10 NOTE — Telephone Encounter (Signed)
Sent in

## 2013-03-23 ENCOUNTER — Other Ambulatory Visit: Payer: Self-pay | Admitting: Family Medicine

## 2013-03-30 DIAGNOSIS — R5381 Other malaise: Secondary | ICD-10-CM | POA: Diagnosis not present

## 2013-03-30 LAB — CBC WITH DIFFERENTIAL/PLATELET
HCT: 33.2 % — ABNORMAL LOW (ref 36.0–46.0)
Hemoglobin: 11.3 g/dL — ABNORMAL LOW (ref 12.0–15.0)
Lymphocytes Relative: 47 % — ABNORMAL HIGH (ref 12–46)
Monocytes Absolute: 0.6 10*3/uL (ref 0.1–1.0)
Monocytes Relative: 11 % (ref 3–12)
Neutro Abs: 1.9 10*3/uL (ref 1.7–7.7)
WBC: 5 10*3/uL (ref 4.0–10.5)

## 2013-04-01 ENCOUNTER — Ambulatory Visit (INDEPENDENT_AMBULATORY_CARE_PROVIDER_SITE_OTHER): Payer: Medicare Other | Admitting: Family Medicine

## 2013-04-01 ENCOUNTER — Encounter: Payer: Self-pay | Admitting: Family Medicine

## 2013-04-01 VITALS — BP 128/68 | HR 71 | Resp 16 | Ht 63.0 in | Wt 117.4 lb

## 2013-04-01 DIAGNOSIS — M810 Age-related osteoporosis without current pathological fracture: Secondary | ICD-10-CM

## 2013-04-01 DIAGNOSIS — R5381 Other malaise: Secondary | ICD-10-CM

## 2013-04-01 DIAGNOSIS — F411 Generalized anxiety disorder: Secondary | ICD-10-CM

## 2013-04-01 DIAGNOSIS — E785 Hyperlipidemia, unspecified: Secondary | ICD-10-CM | POA: Diagnosis not present

## 2013-04-01 DIAGNOSIS — D649 Anemia, unspecified: Secondary | ICD-10-CM

## 2013-04-01 DIAGNOSIS — J309 Allergic rhinitis, unspecified: Secondary | ICD-10-CM

## 2013-04-01 DIAGNOSIS — R5383 Other fatigue: Secondary | ICD-10-CM | POA: Diagnosis not present

## 2013-04-01 NOTE — Patient Instructions (Addendum)
F/u in 3.5 month  We will add iron and ferritin to recent lab and call you with result   Use fexofenadine daily for allergies , OK to use benadryl one at bedtime for allergies and sleep if needed  Use Boost and/ensure 1 daily to supplement intake so you regain weight   For pain  rub the area with biofreeze, if not effective ok to take tylenol intermittently (Max of 3 per week)  Take 1 multivitamin daily  CBC, lipid, cmp, fasting in 3.5 month

## 2013-04-01 NOTE — Progress Notes (Signed)
  Subjective:    Patient ID: Kara [name], female    DOB: 07-Jan-1927, 79 y.o.   MRN: [id number]  HPI The PT is here for follow up and re-evaluation of chronic medical conditions, medication management and review of any available recent lab and radiology data.  Preventive health is updated, specifically  Cancer screening and Immunization.   Low back pain radiating to left buttock intermittently for 2 weeks Has senn several providers, including urgent care and trhe ED with dx of UTI , has been on several antibiotics, unfortunately no culture proven infection and pt has been intolerant of the medications Increased anxiety and poor appetite       Review of Systems See HPI Denies recent fever or chills. Denies sinus pressure, nasal congestion, ear pain or sore throat. Denies chest congestion, productive cough or wheezing. Denies chest pains, palpitations and leg swelling Denies abdominal pain, nausea, vomiting,diarrhea or constipation.   Denies dysuria, frequency, hesitancy or incontinencDenies joint pain, swelling and limitation in mobility. Denies headaches, seizures, numbness, or tingling. Denies depression,  Denies skin break down or rash.        Objective:   Physical Exam  Patient alert and oriented and in no cardiopulmonary distress.Anxious  HEENT: No facial asymmetry, EOMI, no sinus tenderness,  oropharynx pink and moist.  Neck supple no adenopathy.  Chest: Clear to auscultation bilaterally.  CVS: S1, S2 no murmurs, no S3.  ABD: Soft non tender. Bowel sounds normal.  Ext: No edema  MS: decreased  ROM spine, adequate in shoulders, hips and knees.  Skin: Intact, no ulcerations or rash noted.  Psych: Good eye contact, normal affect. Memory mildly impaired,  anxious though not  depressed appearing.  CNS: CN 2-12 intact, power, tone and sensation normal throughout.       Assessment & Plan:

## 2013-04-07 ENCOUNTER — Telehealth: Payer: Self-pay

## 2013-04-07 DIAGNOSIS — D649 Anemia, unspecified: Secondary | ICD-10-CM | POA: Diagnosis not present

## 2013-04-07 DIAGNOSIS — R5381 Other malaise: Secondary | ICD-10-CM | POA: Diagnosis not present

## 2013-04-08 NOTE — Telephone Encounter (Signed)
fyi

## 2013-04-11 NOTE — Assessment & Plan Note (Signed)
Increased with recent illness and seeing several different providers, improving now

## 2013-04-11 NOTE — Assessment & Plan Note (Signed)
Intolerant of bisphosphonate, calcium only

## 2013-04-11 NOTE — Assessment & Plan Note (Signed)
Will change to allegra temporarily as insufficient relief currently

## 2013-04-11 NOTE — Assessment & Plan Note (Signed)
Triglycerides elevated slightly , otherwise good control No med change

## 2013-04-15 DIAGNOSIS — R5381 Other malaise: Secondary | ICD-10-CM | POA: Diagnosis not present

## 2013-04-15 DIAGNOSIS — D649 Anemia, unspecified: Secondary | ICD-10-CM | POA: Diagnosis not present

## 2013-04-16 LAB — IRON: Iron: 108 ug/dL (ref 42–145)

## 2013-04-27 ENCOUNTER — Ambulatory Visit (INDEPENDENT_AMBULATORY_CARE_PROVIDER_SITE_OTHER): Payer: Medicare Other | Admitting: Family Medicine

## 2013-04-27 ENCOUNTER — Encounter: Payer: Self-pay | Admitting: Family Medicine

## 2013-04-27 VITALS — BP 138/62 | HR 76 | Resp 16 | Ht 63.0 in | Wt 118.0 lb

## 2013-04-27 DIAGNOSIS — R4182 Altered mental status, unspecified: Secondary | ICD-10-CM | POA: Diagnosis not present

## 2013-04-27 DIAGNOSIS — E785 Hyperlipidemia, unspecified: Secondary | ICD-10-CM | POA: Diagnosis not present

## 2013-04-27 DIAGNOSIS — F411 Generalized anxiety disorder: Secondary | ICD-10-CM | POA: Diagnosis not present

## 2013-04-27 DIAGNOSIS — R634 Abnormal weight loss: Secondary | ICD-10-CM

## 2013-04-27 NOTE — Patient Instructions (Addendum)
F/u as before  You are referred for a brain scan due to recent symptoms, and also to see Dr Gerilyn Pilgrim a neurologist.  Alternate living arrangements where you stay with one of your children is what I highly recommend at this time

## 2013-04-29 ENCOUNTER — Ambulatory Visit (HOSPITAL_COMMUNITY): Payer: Medicare Other

## 2013-04-29 ENCOUNTER — Telehealth: Payer: Self-pay | Admitting: Family Medicine

## 2013-04-29 NOTE — Telephone Encounter (Signed)
No note of calling patient.  

## 2013-04-30 ENCOUNTER — Ambulatory Visit (INDEPENDENT_AMBULATORY_CARE_PROVIDER_SITE_OTHER): Payer: Medicare Other | Admitting: Otolaryngology

## 2013-04-30 DIAGNOSIS — H9319 Tinnitus, unspecified ear: Secondary | ICD-10-CM

## 2013-04-30 DIAGNOSIS — J310 Chronic rhinitis: Secondary | ICD-10-CM

## 2013-04-30 DIAGNOSIS — J343 Hypertrophy of nasal turbinates: Secondary | ICD-10-CM | POA: Diagnosis not present

## 2013-04-30 DIAGNOSIS — R070 Pain in throat: Secondary | ICD-10-CM

## 2013-04-30 DIAGNOSIS — H903 Sensorineural hearing loss, bilateral: Secondary | ICD-10-CM

## 2013-05-06 ENCOUNTER — Ambulatory Visit (HOSPITAL_COMMUNITY)
Admission: RE | Admit: 2013-05-06 | Discharge: 2013-05-06 | Disposition: A | Discharge status: Home / Self Care | Payer: Medicare Other | Source: Ambulatory Visit | Attending: Family Medicine | Admitting: Family Medicine

## 2013-05-06 DIAGNOSIS — I6789 Other cerebrovascular disease: Secondary | ICD-10-CM | POA: Insufficient documentation

## 2013-05-06 DIAGNOSIS — G319 Degenerative disease of nervous system, unspecified: Secondary | ICD-10-CM | POA: Insufficient documentation

## 2013-05-06 DIAGNOSIS — R4182 Altered mental status, unspecified: Secondary | ICD-10-CM | POA: Diagnosis not present

## 2013-05-19 NOTE — Assessment & Plan Note (Signed)
Hyperlipidemia:Low fat diet discussed and encouraged.  Needs to rduce cheese and butter and red meat, TG slightly elevated

## 2013-05-19 NOTE — Assessment & Plan Note (Signed)
Increased anxiety with somatic component noted in past 6 to 8 month

## 2013-05-19 NOTE — Assessment & Plan Note (Signed)
3 pound weight loss in past 2 month, pt to increase food intake to regain wweight

## 2013-05-19 NOTE — Assessment & Plan Note (Signed)
Recent episode of reported LOC in public setting, noted increased anxiety, and recent c/o headache. Needs brain scan and neurology eval

## 2013-05-19 NOTE — Progress Notes (Signed)
  Subjective:    Patient ID: Kara [name], female    DOB: 1927-07-19, 79 y.o.   MRN: [id number]  HPI Pt in with her daughter severely anxious and concerned due to recent episode at [facility] yesterday when she apparently lost conciouness, has absolutely no recall of what took place for a while during the service. In the recent past she had turned down a head scan when she had c/o headache, again she is here with neurologic brain complaints, I absolutely advise she get he scan as well as see a neurologist. Both herself and her daughter in agreement. There is concern voiced about her safety to continue to live independently, and based on recurrent OV in the past 6 months with similar complaints and increased anxiety in pt, I honestly believe she is at the point where she will be better off living with one or both daughters, and I have clearly stated trhis   Review of Systems See HPI Denies recent fever or chills. Denies sinus pressure, nasal congestion, ear pain or sore throat. Denies chest congestion, productive cough or wheezing. Denies chest pains, palpitations and leg swelling Denies abdominal pain, nausea, vomiting,diarrhea or constipation.   Denies dysuria, frequency, hesitancy or incontinence. Mild generalized joint pain and stiffness esp in the neck, no falls Denies skin break down or rash.        Objective:   Physical Exam  Patient alert and oriented and in no cardiopulmonary distress.  HEENT: No facial asymmetry, EOMI, no sinus tenderness,  oropharynx pink and moist.  Neck decreased though adequate ROM no adenopathy.  Chest: Clear to auscultation bilaterally.  CVS: S1, S2 no murmurs, no S3.  ABD: Soft non tender. Bowel sounds normal.  Ext: No edema  NW:[id number] though : Adequate ROM spine, shoulders, hips and knees.  Skin: Intact, no ulcerations or rash noted.  Psych: Good eye contact, normal affect. Memory mildly impaired, anxious not depressed appearing.  CNS: CN  2-12 intact, power, tone and sensation normal throughout.       Assessment & Plan:

## 2013-05-22 ENCOUNTER — Other Ambulatory Visit: Payer: Self-pay | Admitting: Family Medicine

## 2013-05-28 DIAGNOSIS — R4182 Altered mental status, unspecified: Secondary | ICD-10-CM | POA: Diagnosis not present

## 2013-05-28 DIAGNOSIS — H9190 Unspecified hearing loss, unspecified ear: Secondary | ICD-10-CM | POA: Diagnosis not present

## 2013-05-28 DIAGNOSIS — M542 Cervicalgia: Secondary | ICD-10-CM | POA: Diagnosis not present

## 2013-06-02 ENCOUNTER — Other Ambulatory Visit: Payer: Self-pay

## 2013-06-02 ENCOUNTER — Telehealth: Payer: Self-pay | Admitting: Family Medicine

## 2013-06-02 MED ORDER — OMEPRAZOLE 20 MG PO CPDR: DELAYED_RELEASE_CAPSULE | ORAL | Status: DC

## 2013-06-02 NOTE — Telephone Encounter (Signed)
I still recommend she does the test, since she ahd the episode at [facility] where she  Was out of it" need to see if we can understand why so if treatable, this can be treated sio it does not happen again.Pls explain and encourage her. Let her know I hope she continues to feel better

## 2013-06-05 NOTE — Telephone Encounter (Signed)
Patient states that at this time she does not want to have the testing done.  She has had no further episodes.  She states that she will have the test rescheduled if she has any more symptoms.

## 2013-07-10 ENCOUNTER — Telehealth: Payer: Self-pay

## 2013-07-10 ENCOUNTER — Other Ambulatory Visit: Payer: Self-pay | Admitting: Family Medicine

## 2013-07-10 MED ORDER — CETIRIZINE HCL 10 MG PO TBDP: 1.0000 | ORAL_TABLET | Freq: Every day | ORAL | Status: DC

## 2013-07-10 NOTE — Telephone Encounter (Signed)
Wants to know if she can have a prescription called in for zyrtec. She is going to try that instead of the benadryl. Send to [facility]. Told her to check with the pharmacy later today

## 2013-07-10 NOTE — Telephone Encounter (Signed)
Medication is sent in 

## 2013-07-17 ENCOUNTER — Other Ambulatory Visit: Payer: Self-pay | Admitting: Family Medicine

## 2013-07-17 DIAGNOSIS — E785 Hyperlipidemia, unspecified: Secondary | ICD-10-CM | POA: Diagnosis not present

## 2013-07-17 LAB — COMPREHENSIVE METABOLIC PANEL
AST: 20 U/L (ref 0–37)
Albumin: 4.3 g/dL (ref 3.5–5.2)
BUN: 7 mg/dL (ref 6–23)
Calcium: 9.9 mg/dL (ref 8.4–10.5)
Chloride: 103 mEq/L (ref 96–112)
Glucose, Bld: 105 mg/dL — ABNORMAL HIGH (ref 70–99)
Potassium: 4.6 mEq/L (ref 3.5–5.3)

## 2013-07-17 LAB — LIPID PANEL
HDL: 62 mg/dL (ref >39)
Triglycerides: 139 mg/dL (ref <150)

## 2013-07-20 LAB — CBC

## 2013-07-22 ENCOUNTER — Ambulatory Visit (INDEPENDENT_AMBULATORY_CARE_PROVIDER_SITE_OTHER): Payer: Medicare Other | Admitting: Family Medicine

## 2013-07-22 ENCOUNTER — Encounter: Payer: Self-pay | Admitting: Family Medicine

## 2013-07-22 ENCOUNTER — Telehealth: Payer: Self-pay

## 2013-07-22 ENCOUNTER — Other Ambulatory Visit: Payer: Self-pay | Admitting: Family Medicine

## 2013-07-22 VITALS — BP 134/68 | HR 70 | Resp 18 | Ht 63.0 in | Wt 118.1 lb

## 2013-07-22 DIAGNOSIS — Z23 Encounter for immunization: Secondary | ICD-10-CM

## 2013-07-22 DIAGNOSIS — E785 Hyperlipidemia, unspecified: Secondary | ICD-10-CM | POA: Diagnosis not present

## 2013-07-22 DIAGNOSIS — F411 Generalized anxiety disorder: Secondary | ICD-10-CM

## 2013-07-22 DIAGNOSIS — R7302 Impaired glucose tolerance (oral): Secondary | ICD-10-CM

## 2013-07-22 DIAGNOSIS — R7309 Other abnormal glucose: Secondary | ICD-10-CM

## 2013-07-22 DIAGNOSIS — Z1239 Encounter for other screening for malignant neoplasm of breast: Secondary | ICD-10-CM

## 2013-07-22 DIAGNOSIS — K219 Gastro-esophageal reflux disease without esophagitis: Secondary | ICD-10-CM

## 2013-07-22 DIAGNOSIS — Z13820 Encounter for screening for osteoporosis: Secondary | ICD-10-CM

## 2013-07-22 DIAGNOSIS — R5381 Other malaise: Secondary | ICD-10-CM

## 2013-07-22 DIAGNOSIS — M810 Age-related osteoporosis without current pathological fracture: Secondary | ICD-10-CM

## 2013-07-22 LAB — CBC
HCT: 34.5 % — ABNORMAL LOW (ref 36.0–46.0)
RDW: 14.3 % (ref 11.5–15.5)
WBC: 4.9 10*3/uL (ref 4.0–10.5)

## 2013-07-22 NOTE — Telephone Encounter (Signed)
Opened in error

## 2013-07-22 NOTE — Progress Notes (Signed)
  Subjective:    Patient ID: Kara [name], female    DOB: 1927-06-01, 79 y.o.   MRN: [id number]  HPI The PT is here for follow up and re-evaluation of chronic medical conditions, medication management and review of any available recent lab and radiology data.  Preventive health is updated, specifically  Cancer screening and Immunization.    The PT denies any adverse reactions to current medications since the last visit.  There are no new concerns.  There are no specific complaints , just the usual generalized arthritic pains, denies falls or depression. Celebrated 23 yesterday, feels happy and thankful      Review of Systems See HPI Denies recent fever or chills.Notes that she tires more easily, but not to the extent that she is worried about this, attributes this to aging. Denies sinus pressure, nasal congestion, ear pain or sore throat. Denies chest congestion, productive cough or wheezing. Denies chest pains, palpitations and leg swelling Denies abdominal pain, nausea, vomiting,diarrhea or constipation.   Denies dysuria, frequency, hesitancy or incontinence.  Denies headaches, seizures, numbness, or tingling. Denies depression, uncontrolled anxiety or insomnia. Denies skin break down or rash.        Objective:   Physical Exam  Patient alert and oriented and in no cardiopulmonary distress.  HEENT: No facial asymmetry, EOMI, no sinus tenderness,  oropharynx pink and moist.  Neck adequate rOM, no adenopathy.No JVD  Chest: Clear to auscultation bilaterally.  CVS: S1, S2 no murmurs, no S3.  ABD: Soft non tender. Bowel sounds normal.  Ext: No edema  MS: Adequate though reduced ROM spine, shoulders, hips and knees.  Skin: Intact, no ulcerations or rash noted.  Psych: Good eye contact, normal affect. Memory intact not anxious or depressed appearing.  CNS: CN 2-12 intact, power, tone and sensation normal throughout.       Assessment & Plan:

## 2013-07-22 NOTE — Patient Instructions (Addendum)
Annual wellness in 4 month, call if you need me beforee  Flu vaccine today   You are referred for mammogram at [facility] also for bone density scan  Vit D, TSH and HBA1C non fasting in 4 month, before visit

## 2013-07-23 LAB — HEMOGLOBIN A1C
Hgb A1c MFr Bld: 5.7 % — ABNORMAL HIGH (ref <5.7)
Mean Plasma Glucose: 117 mg/dL — ABNORMAL HIGH (ref <117)

## 2013-07-24 ENCOUNTER — Encounter: Payer: Self-pay | Admitting: Family Medicine

## 2013-07-26 DIAGNOSIS — R7302 Impaired glucose tolerance (oral): Secondary | ICD-10-CM | POA: Insufficient documentation

## 2013-07-26 NOTE — Assessment & Plan Note (Signed)
Controlled, no change in medication  

## 2013-07-26 NOTE — Assessment & Plan Note (Signed)
Needs updated dexa. I have discussed the  Benefit of bisphosphonate terapy, and pt willing to reconsider due to increased risk of fracture with age

## 2013-07-26 NOTE — Assessment & Plan Note (Signed)
Pt to reduce intake a of sweets unlikely to become diabetic,just advised to increase sweet and carb intrake in form of  Complex carbs and fruit

## 2013-07-26 NOTE — Assessment & Plan Note (Signed)
Improved, had an 87th birthday and is better than she has been in past several months as far as anxiety is concerned.  Still living on her own, goes to one of her daughters when she feels the need to

## 2013-07-26 NOTE — Assessment & Plan Note (Signed)
Controlled, no change in medication Hyperlipidemia:Low fat diet discussed and encouraged.  \ 

## 2013-07-27 ENCOUNTER — Ambulatory Visit (INDEPENDENT_AMBULATORY_CARE_PROVIDER_SITE_OTHER): Payer: Medicare Other | Admitting: Family Medicine

## 2013-07-27 ENCOUNTER — Encounter: Payer: Self-pay | Admitting: Family Medicine

## 2013-07-27 VITALS — BP 140/64 | HR 70 | Resp 16 | Ht 63.0 in | Wt 120.1 lb

## 2013-07-27 DIAGNOSIS — I889 Nonspecific lymphadenitis, unspecified: Secondary | ICD-10-CM | POA: Insufficient documentation

## 2013-07-27 DIAGNOSIS — E785 Hyperlipidemia, unspecified: Secondary | ICD-10-CM

## 2013-07-27 DIAGNOSIS — K219 Gastro-esophageal reflux disease without esophagitis: Secondary | ICD-10-CM

## 2013-07-27 DIAGNOSIS — J309 Allergic rhinitis, unspecified: Secondary | ICD-10-CM | POA: Diagnosis not present

## 2013-07-27 MED ORDER — AZITHROMYCIN 250 MG PO TABS: ORAL_TABLET | ORAL | Status: AC

## 2013-07-27 NOTE — Patient Instructions (Signed)
F/u as before  Zpack sent in for tender glands.  Use flonase twice daily for the next 3 days, then one daily

## 2013-07-29 ENCOUNTER — Telehealth: Payer: Self-pay | Admitting: Family Medicine

## 2013-07-31 NOTE — Telephone Encounter (Signed)
Spoke with patient and she will continue to take Flonase.

## 2013-08-05 DIAGNOSIS — Z1231 Encounter for screening mammogram for malignant neoplasm of breast: Secondary | ICD-10-CM | POA: Diagnosis not present

## 2013-08-06 ENCOUNTER — Ambulatory Visit (HOSPITAL_COMMUNITY)
Admission: RE | Admit: 2013-08-06 | Discharge: 2013-08-06 | Disposition: A | Discharge status: Home / Self Care | Payer: Medicare Other | Source: Ambulatory Visit | Attending: Family Medicine | Admitting: Family Medicine

## 2013-08-06 ENCOUNTER — Ambulatory Visit (HOSPITAL_COMMUNITY): Payer: Medicare Other

## 2013-08-06 DIAGNOSIS — M810 Age-related osteoporosis without current pathological fracture: Secondary | ICD-10-CM | POA: Diagnosis not present

## 2013-08-06 DIAGNOSIS — M818 Other osteoporosis without current pathological fracture: Secondary | ICD-10-CM | POA: Insufficient documentation

## 2013-08-06 DIAGNOSIS — Z13820 Encounter for screening for osteoporosis: Secondary | ICD-10-CM

## 2013-08-07 ENCOUNTER — Other Ambulatory Visit: Payer: Self-pay | Admitting: Family Medicine

## 2013-08-10 MED ORDER — AZITHROMYCIN 250 MG PO TABS: ORAL_TABLET | ORAL | Status: AC

## 2013-08-10 NOTE — Assessment & Plan Note (Signed)
Controlled, no change in medication  

## 2013-08-10 NOTE — Assessment & Plan Note (Signed)
Tender anterior neck glands

## 2013-08-10 NOTE — Assessment & Plan Note (Signed)
Hyperlipidemia:Low fat diet discussed and encouraged.  No med change at this time

## 2013-08-10 NOTE — Progress Notes (Signed)
  Subjective:    Patient ID: Kara [name], female    DOB: Aug 03, 1927, 79 y.o.   MRN: [id number]  HPI 5 day h/o bilateral ear pain, and painful neck glands. No h/o fever, sinus pressure, sore throat or cough. . Has had chills. Otherwise no complaints   Review of Systems    See HPI Denies recent fever or chills. Denies sinus pressure, nasal congestion,  or sore throat. Denies chest congestion, productive cough or wheezing. Denies chest pains, palpitations and leg swelling Denies abdominal pain, nausea, vomiting,diarrhea or constipation.   Denies dysuria, frequency, hesitancy or incontinence. Chronic  joint pain,  and limitation in mobility. Denies headaches, seizures, numbness, or tingling. Denies depression uncontrolled , anxiety or insomnia. Denies skin break down or rash.     Objective:   Physical Exam  Patient alert and oriented and in no cardiopulmonary distress.  HEENT: No facial asymmetry, EOMI, no sinus tenderness,  oropharynx pink and moist.  Neck supple Bilateral anterior cervical adenitis, TM not erythematous, fluid behind ear drums.  Chest: Clear to auscultation bilaterally.  CVS: S1, S2 no murmurs, no S3.  ABD: Soft non tender. Bowel sounds normal.  Ext: No edema  MS: Adequate ROM spine, shoulders, hips and knees.  Skin: Intact, no ulcerations or rash noted.  Psych: Good eye contact, normal affect. Memory intact not anxious or depressed appearing.  CNS: CN 2-12 intact, power, tone and sensation normal throughout.       Assessment & Plan:

## 2013-08-11 ENCOUNTER — Telehealth: Payer: Self-pay | Admitting: Family Medicine

## 2013-08-11 NOTE — Telephone Encounter (Signed)
Please advise 

## 2013-08-11 NOTE — Telephone Encounter (Signed)
csent in error, does not need that, pls let her know

## 2013-08-11 NOTE — Telephone Encounter (Signed)
Patient aware.

## 2013-08-12 ENCOUNTER — Other Ambulatory Visit: Payer: Self-pay

## 2013-08-12 MED ORDER — RISEDRONATE SODIUM 35 MG PO TABS: 35.0000 mg | ORAL_TABLET | ORAL | Status: DC

## 2013-08-17 ENCOUNTER — Other Ambulatory Visit: Payer: Self-pay | Admitting: Family Medicine

## 2013-08-18 DIAGNOSIS — H31019 Macula scars of posterior pole (postinflammatory) (post-traumatic), unspecified eye: Secondary | ICD-10-CM | POA: Diagnosis not present

## 2013-08-18 DIAGNOSIS — Z961 Presence of intraocular lens: Secondary | ICD-10-CM | POA: Diagnosis not present

## 2013-08-18 DIAGNOSIS — H353190 Nonexudative age-related macular degeneration, unspecified eye, stage unspecified: Secondary | ICD-10-CM | POA: Diagnosis not present

## 2013-08-22 ENCOUNTER — Encounter: Payer: Self-pay | Admitting: Family Medicine

## 2013-08-24 ENCOUNTER — Telehealth: Payer: Self-pay | Admitting: Family Medicine

## 2013-08-24 NOTE — Telephone Encounter (Signed)
Please change actonel to fosamax 70mg  one weekly #4 refill 11, let pharmacy and pt know, due to insurance coverage. Pls send in script please Thanks

## 2013-08-25 ENCOUNTER — Other Ambulatory Visit: Payer: Self-pay

## 2013-08-25 ENCOUNTER — Other Ambulatory Visit: Payer: Self-pay | Admitting: Family Medicine

## 2013-08-25 MED ORDER — ALENDRONATE SODIUM 70 MG PO TABS: 70.0000 mg | ORAL_TABLET | ORAL | Status: DC

## 2013-08-25 NOTE — Telephone Encounter (Signed)
Changed to fosamax and note to pharmacy to d/c actonel. Patient aware

## 2013-09-01 ENCOUNTER — Telehealth: Payer: Self-pay | Admitting: Family Medicine

## 2013-09-01 ENCOUNTER — Other Ambulatory Visit: Payer: Self-pay | Admitting: Family Medicine

## 2013-09-01 DIAGNOSIS — N814 Uterovaginal prolapse, unspecified: Secondary | ICD-10-CM

## 2013-09-01 NOTE — Telephone Encounter (Signed)
Can she be referred for this or does she need to be seen here first?

## 2013-09-01 NOTE — Telephone Encounter (Signed)
pls refer pt to Dr Emelda Fear or gyne of her choice to evaluate uterine prolapse, already on her record, so does not need eval for this here, Referral entered

## 2013-09-16 ENCOUNTER — Encounter: Payer: Self-pay | Admitting: Obstetrics & Gynecology

## 2013-09-22 ENCOUNTER — Ambulatory Visit (INDEPENDENT_AMBULATORY_CARE_PROVIDER_SITE_OTHER): Payer: Medicare Other | Admitting: Obstetrics & Gynecology

## 2013-09-22 ENCOUNTER — Encounter: Payer: Self-pay | Admitting: Obstetrics & Gynecology

## 2013-09-22 VITALS — BP 130/70 | Ht 63.0 in | Wt 122.0 lb

## 2013-09-22 DIAGNOSIS — N8111 Cystocele, midline: Secondary | ICD-10-CM | POA: Diagnosis not present
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
## 2013-09-23 ENCOUNTER — Telehealth: Payer: Self-pay

## 2013-09-23 NOTE — Telephone Encounter (Signed)
Returned patient's call.  No answering machine to leave message.

## 2013-09-29 ENCOUNTER — Ambulatory Visit: Payer: Medicare Other | Admitting: Obstetrics & Gynecology

## 2013-11-16 ENCOUNTER — Other Ambulatory Visit: Payer: Self-pay | Admitting: Family Medicine

## 2013-11-24 ENCOUNTER — Ambulatory Visit (INDEPENDENT_AMBULATORY_CARE_PROVIDER_SITE_OTHER): Payer: Medicare Other | Admitting: Family Medicine

## 2013-11-24 ENCOUNTER — Encounter: Payer: Self-pay | Admitting: Family Medicine

## 2013-11-24 VITALS — BP 124/68 | HR 75 | Resp 16 | Ht 62.0 in | Wt 122.8 lb

## 2013-11-24 DIAGNOSIS — R5383 Other fatigue: Secondary | ICD-10-CM | POA: Diagnosis not present

## 2013-11-24 DIAGNOSIS — R220 Localized swelling, mass and lump, head: Secondary | ICD-10-CM | POA: Diagnosis not present

## 2013-11-24 DIAGNOSIS — R221 Localized swelling, mass and lump, neck: Secondary | ICD-10-CM | POA: Diagnosis not present

## 2013-11-24 DIAGNOSIS — R5381 Other malaise: Secondary | ICD-10-CM | POA: Diagnosis not present

## 2013-11-24 DIAGNOSIS — M542 Cervicalgia: Secondary | ICD-10-CM

## 2013-11-24 DIAGNOSIS — E041 Nontoxic single thyroid nodule: Secondary | ICD-10-CM | POA: Diagnosis not present

## 2013-11-24 DIAGNOSIS — N814 Uterovaginal prolapse, unspecified: Secondary | ICD-10-CM

## 2013-11-24 DIAGNOSIS — M810 Age-related osteoporosis without current pathological fracture: Secondary | ICD-10-CM | POA: Diagnosis not present

## 2013-11-24 DIAGNOSIS — Z13820 Encounter for screening for osteoporosis: Secondary | ICD-10-CM | POA: Diagnosis not present

## 2013-11-24 DIAGNOSIS — Z0000 Encounter for general adult medical examination without abnormal findings: Secondary | ICD-10-CM | POA: Diagnosis not present

## 2013-11-24 DIAGNOSIS — R7309 Other abnormal glucose: Secondary | ICD-10-CM | POA: Diagnosis not present

## 2013-11-24 MED ORDER — DICLOFENAC SODIUM 1 % TD GEL: TRANSDERMAL | Status: DC

## 2013-11-24 NOTE — Patient Instructions (Addendum)
F/u in 4 month, call if you need me before  You will be referred for an [place] of your neck as well as an xray of your neck  You will be referred for physical therapy in April for neck pain and stiffness  You will be referred for management of your prolapse to another Provider  erescribed a medication to be rubbed on hurtiong joints , like your neck which will help with the pain

## 2013-11-24 NOTE — Progress Notes (Signed)
Subjective:    Patient ID: Kara [name], female    DOB: 1927/09/09, 80 y.o.   MRN: [id number]  HPI  Preventive Screening-Counseling & Management   Patient present here today for a Medicare annual wellness visit. C/o increased neck pain with reduced mobility, and also c/o enlarging mass on left side of neck C/o perineal discomfort from prolapsed uterus and cystocoele. Had been evaluated last Summer by gyne, but did not keep f/u states exam was too rough wants female Provider   Current Problems (verified)   Medications Prior to Visit Allergies (verified)   PAST HISTORY  Family History  Social History : 27 years a  Widow , , mother of 66 living, 1 son and 5 daughters   Risk Factors  Current exercise habits:  Very active, walks for at least 30 mins daily doing household chores, no significant arthritis in lower extremities to retard movement  Dietary issues discussed:heart healthy diet   Cardiac risk factors: none significant  Depression Screen  (Note: if answer to either of the following is "Yes", a more complete depression screening is indicated)   Over the past two weeks, have you felt down, depressed or hopeless? No  Over the past two weeks, have you felt little interest or pleasure in doing things? No  Have you lost interest or pleasure in daily life? No  Do you often feel hopeless? No  Do you cry easily over simple problems? No   Activities of Daily Living  In your present state of health, do you have any difficulty performing the following activities?  Driving?: No Managing money?: No Feeding yourself?:No Getting from bed to chair?:No Climbing a flight of stairs?:No Preparing food and eating?:No Bathing or showering?:No Getting dressed?:No Getting to the toilet?:No Using the toilet?:No Moving around from place to place?: No  Fall Risk Assessment In the past year have you fallen or had a near fall?:No Are you currently taking any medications that make you  dizzy?:No   Hearing Difficulties: No Do you often ask people to speak up or repeat themselves?:No Do you experience ringing or noises in your ears?:No Do you have difficulty understanding soft or whispered voices?:No  Cognitive Testing  Alert? Yes Normal Appearance?Yes  Oriented to person? Yes Place? Yes  Time? Yes  Displays appropriate judgment?Yes  Can read the correct time from a watch face? yes Are you having problems remembering things?No  Advanced Directives have been discussed with the patient?Yes , full code   List the Names of Other Physician/Practitioners you currently use:    Indicate any recent Medical Services you may have received from other than [facility] providers in the past year (date may be approximate).   Assessment:    Annual Wellness Exam   Plan:    During the course of the visit the patient was educated and counseled about appropriate screening and preventive services including:  A healthy diet is rich in fruit, vegetables and whole grains. Poultry fish, nuts and beans are a healthy choice for protein rather then red meat. A low sodium diet and drinking 64 ounces of water daily is generally recommended. Oils and sweet should be limited. Carbohydrates especially for those who are diabetic or overweight, should be limited to 30-45 gram per meal. It is important to eat on a regular schedule, at least 3 times daily. Snacks should be primarily fruits, vegetables or nuts. It is important that you exercise regularly at least 30 minutes 5 times a week. If you develop chest pain,  have severe difficulty breathing, or feel very tired, stop exercising immediately and seek medical attention  Immunization reviewed and updated. Cancer screening reviewed and updated    Patient Instructions (the written plan) was given to the patient.  Medicare Attestation  I have personally reviewed:  The patient's medical and social history  Their use of alcohol, tobacco or illicit drugs    Their current medications and supplements  The patient's functional ability including ADLs,fall risks, home safety risks, cognitive, and hearing and visual impairment  Diet and physical activities  Evidence for depression or mood disorders  The patient's weight, height, BMI, and visual acuity have been recorded in the chart. I have made referrals, counseling, and provided education to the patient based on review of the above and I have provided the patient with a written personalized care plan for preventive services.      Review of Systems     Objective:   Physical Exam BP 124/68  Pulse 75  Resp 16  Ht 5\' 2"  (1.575 m)  Wt 122 lb 12.8 oz (55.702 kg)  BMI 22.45 kg/m2  SpO2 99% Alert, oriented, pleasant elderly female in no c/p distress.  HEENT: Decreased ROM neck with mild left trapezius spasm, No facial assymetry.EOMI No palpable goiter or thyroid nodule. No palpable mass in area of patient's concern, No JVD, no adenopathy. No bruit.  Chest CTA bilaterally  CVS: S1 and S2 no murmur, no S3  Abdomen :soft , non tender. Genital : deferred Ext: no edema        Assessment & Plan:  NECK PAIN Increased pain and stiffness, refer to PT in Spring and update xray of neck  Routine general medical examination at a health care facility Annual exam as documented. Counseling done  re healthy lifestyle involving commitment to 150 minutes exercise per week, heart healthy diet, and attaining healthy weight.The importance of adequate sleep also discussed. Regular seat belt use and safe storage  of firearms if patient has them, is also discussed. Changes in health habits are decided on by the patient with goals and time frames  set for achieving them. Immunization and cancer screening needs are specifically addressed at this visit.   UTERINE PROLAPSE C/o ongoing perineal discomfort, wants to be re evaluated by gyne for this but  Requests female Provider  Review of Dr Brynda Greathouse note  states that ring was ordered to be fitted  The following 1 to 2 weeks , however , unfortunately , pt did not return, states the exam was "too painful" Will refer her to female Provider per her request  Localized swelling, mass or lump of neck Exam is negatyive for any specific mass, her last neck US in 2014 only specifically commented on there thyroid gland, will refer for rept [place] of neck with specific attention to area of patient's concern

## 2013-11-25 LAB — HEMOGLOBIN A1C
Hgb A1c MFr Bld: 5.9 % — ABNORMAL HIGH (ref <5.7)
Mean Plasma Glucose: 123 mg/dL — ABNORMAL HIGH (ref <117)

## 2013-11-25 LAB — TSH: TSH: 4.321 u[IU]/mL (ref 0.350–4.500)

## 2013-11-25 LAB — VITAMIN D 25 HYDROXY (VIT D DEFICIENCY, FRACTURES): Vit D, 25-Hydroxy: 39 ng/mL (ref 30–89)

## 2013-11-27 ENCOUNTER — Telehealth: Payer: Self-pay | Admitting: Family Medicine

## 2013-11-27 DIAGNOSIS — M542 Cervicalgia: Secondary | ICD-10-CM

## 2013-11-27 DIAGNOSIS — R221 Localized swelling, mass and lump, neck: Secondary | ICD-10-CM | POA: Insufficient documentation

## 2013-11-27 NOTE — Telephone Encounter (Signed)
Referral has been entered for the ultrasound of the neck and also the physical therapy twice weekly to begin in April per pt request pls

## 2013-12-04 ENCOUNTER — Ambulatory Visit (HOSPITAL_COMMUNITY)
Admission: RE | Admit: 2013-12-04 | Discharge: 2013-12-04 | Disposition: A | Discharge status: Home / Self Care | Payer: Medicare Other | Source: Ambulatory Visit | Attending: Family Medicine | Admitting: Family Medicine

## 2013-12-04 ENCOUNTER — Ambulatory Visit (HOSPITAL_COMMUNITY): Payer: Medicare Other

## 2013-12-04 DIAGNOSIS — R221 Localized swelling, mass and lump, neck: Secondary | ICD-10-CM | POA: Diagnosis not present

## 2013-12-04 DIAGNOSIS — E041 Nontoxic single thyroid nodule: Secondary | ICD-10-CM | POA: Insufficient documentation

## 2013-12-04 DIAGNOSIS — R220 Localized swelling, mass and lump, head: Secondary | ICD-10-CM | POA: Insufficient documentation

## 2013-12-07 ENCOUNTER — Telehealth: Payer: Self-pay | Admitting: Family Medicine

## 2013-12-08 DIAGNOSIS — E041 Nontoxic single thyroid nodule: Secondary | ICD-10-CM

## 2013-12-08 HISTORY — DX: Nontoxic single thyroid nodule: E04.1

## 2013-12-08 NOTE — Assessment & Plan Note (Signed)
Annual exam as documented. Counseling done  re healthy lifestyle involving commitment to 150 minutes exercise per week, heart healthy diet, and attaining healthy weight.The importance of adequate sleep also discussed. Regular seat belt use and safe storage  of firearms if patient has them, is also discussed. Changes in health habits are decided on by the patient with goals and time frames  set for achieving them. Immunization and cancer screening needs are specifically addressed at this visit.  

## 2013-12-08 NOTE — Assessment & Plan Note (Signed)
C/o ongoing perineal discomfort, wants to be re evaluated by gyne for this but  Requests female Provider  Review of Dr Brynda Greathouse note states that ring was ordered to be fitted  The following 1 to 2 weeks , however , unfortunately , pt did not return, states the exam was "too painful" Will refer her to female Provider per her request

## 2013-12-08 NOTE — Assessment & Plan Note (Signed)
Increased pain and stiffness, refer to PT in Spring and update xray of neck

## 2013-12-08 NOTE — Assessment & Plan Note (Signed)
Exam is negatyive for any specific mass, her last neck US in 2014 only specifically commented on there thyroid gland, will refer for rept [place] of neck with specific attention to area of patient's concern

## 2013-12-14 ENCOUNTER — Ambulatory Visit (HOSPITAL_COMMUNITY)
Admission: RE | Admit: 2013-12-14 | Discharge: 2013-12-14 | Disposition: A | Discharge status: Home / Self Care | Payer: Medicare Other | Source: Ambulatory Visit | Attending: Family Medicine | Admitting: Family Medicine

## 2013-12-14 DIAGNOSIS — M539 Dorsopathy, unspecified: Secondary | ICD-10-CM | POA: Insufficient documentation

## 2013-12-14 DIAGNOSIS — M542 Cervicalgia: Secondary | ICD-10-CM | POA: Insufficient documentation

## 2013-12-14 DIAGNOSIS — M47812 Spondylosis without myelopathy or radiculopathy, cervical region: Secondary | ICD-10-CM | POA: Diagnosis not present

## 2013-12-14 DIAGNOSIS — R937 Abnormal findings on diagnostic imaging of other parts of musculoskeletal system: Secondary | ICD-10-CM | POA: Insufficient documentation

## 2013-12-15 ENCOUNTER — Other Ambulatory Visit: Payer: Self-pay | Admitting: Family Medicine

## 2013-12-23 ENCOUNTER — Telehealth: Payer: Self-pay

## 2013-12-23 DIAGNOSIS — H938X9 Other specified disorders of ear, unspecified ear: Secondary | ICD-10-CM

## 2013-12-24 NOTE — Telephone Encounter (Signed)
Thank you :)

## 2013-12-24 NOTE — Telephone Encounter (Signed)
Referral entered  

## 2013-12-27 DIAGNOSIS — N390 Urinary tract infection, site not specified: Secondary | ICD-10-CM | POA: Diagnosis not present

## 2013-12-27 DIAGNOSIS — Z719 Counseling, unspecified: Secondary | ICD-10-CM | POA: Diagnosis not present

## 2013-12-29 ENCOUNTER — Telehealth: Payer: Self-pay

## 2013-12-29 ENCOUNTER — Encounter (INDEPENDENT_AMBULATORY_CARE_PROVIDER_SITE_OTHER): Payer: Self-pay

## 2013-12-29 ENCOUNTER — Ambulatory Visit (INDEPENDENT_AMBULATORY_CARE_PROVIDER_SITE_OTHER): Payer: Medicare Other

## 2013-12-29 VITALS — BP 128/74 | Wt 124.0 lb

## 2013-12-29 DIAGNOSIS — N3000 Acute cystitis without hematuria: Secondary | ICD-10-CM | POA: Diagnosis not present

## 2013-12-29 LAB — POCT URINALYSIS DIPSTICK
BILIRUBIN UA: NEGATIVE
GLUCOSE UA: NEGATIVE
Ketones, UA: NEGATIVE
Leukocytes, UA: NEGATIVE
NITRITE UA: NEGATIVE
Protein, UA: NEGATIVE
Urobilinogen, UA: 0.2
pH, UA: 5.5

## 2013-12-29 NOTE — Progress Notes (Signed)
Patient in for nurse visit for poct ua.  Ua performed and entered.  Patient aware that no signs of infection noted.  Urine will be sent for culture and patient notified of results.

## 2013-12-29 NOTE — Telephone Encounter (Signed)
Noted.  Patient in for nurse visit  

## 2013-12-29 NOTE — Telephone Encounter (Signed)
ua in office today, and send for c/s also, if abn UA advise her to take the cipro and document the dose in the note pls

## 2013-12-31 LAB — URINE CULTURE

## 2014-01-04 DIAGNOSIS — S33101A Dislocation of unspecified lumbar vertebra, initial encounter: Secondary | ICD-10-CM | POA: Diagnosis not present

## 2014-01-04 DIAGNOSIS — M999 Biomechanical lesion, unspecified: Secondary | ICD-10-CM | POA: Diagnosis not present

## 2014-01-07 DIAGNOSIS — S33101A Dislocation of unspecified lumbar vertebra, initial encounter: Secondary | ICD-10-CM | POA: Diagnosis not present

## 2014-01-07 DIAGNOSIS — M999 Biomechanical lesion, unspecified: Secondary | ICD-10-CM | POA: Diagnosis not present

## 2014-01-09 ENCOUNTER — Encounter (HOSPITAL_COMMUNITY): Payer: Self-pay | Admitting: Emergency Medicine

## 2014-01-09 ENCOUNTER — Emergency Department (HOSPITAL_COMMUNITY)
Admission: EM | Admit: 2014-01-09 | Discharge: 2014-01-09 | Disposition: A | Discharge status: Home / Self Care | Payer: Medicare Other | Attending: Emergency Medicine | Admitting: Emergency Medicine

## 2014-01-09 ENCOUNTER — Emergency Department (HOSPITAL_COMMUNITY): Payer: Medicare Other

## 2014-01-09 DIAGNOSIS — G8929 Other chronic pain: Secondary | ICD-10-CM | POA: Insufficient documentation

## 2014-01-09 DIAGNOSIS — Z8669 Personal history of other diseases of the nervous system and sense organs: Secondary | ICD-10-CM | POA: Diagnosis not present

## 2014-01-09 DIAGNOSIS — M1990 Unspecified osteoarthritis, unspecified site: Secondary | ICD-10-CM | POA: Diagnosis not present

## 2014-01-09 DIAGNOSIS — R109 Unspecified abdominal pain: Secondary | ICD-10-CM

## 2014-01-09 DIAGNOSIS — Z79899 Other long term (current) drug therapy: Secondary | ICD-10-CM | POA: Diagnosis not present

## 2014-01-09 DIAGNOSIS — Z9889 Other specified postprocedural states: Secondary | ICD-10-CM | POA: Insufficient documentation

## 2014-01-09 DIAGNOSIS — Z8742 Personal history of other diseases of the female genital tract: Secondary | ICD-10-CM | POA: Diagnosis not present

## 2014-01-09 DIAGNOSIS — K219 Gastro-esophageal reflux disease without esophagitis: Secondary | ICD-10-CM | POA: Insufficient documentation

## 2014-01-09 DIAGNOSIS — M549 Dorsalgia, unspecified: Secondary | ICD-10-CM | POA: Diagnosis not present

## 2014-01-09 DIAGNOSIS — F411 Generalized anxiety disorder: Secondary | ICD-10-CM | POA: Insufficient documentation

## 2014-01-09 DIAGNOSIS — E785 Hyperlipidemia, unspecified: Secondary | ICD-10-CM | POA: Insufficient documentation

## 2014-01-09 DIAGNOSIS — Z9851 Tubal ligation status: Secondary | ICD-10-CM | POA: Diagnosis not present

## 2014-01-09 DIAGNOSIS — Z7982 Long term (current) use of aspirin: Secondary | ICD-10-CM | POA: Diagnosis not present

## 2014-01-09 DIAGNOSIS — R1033 Periumbilical pain: Secondary | ICD-10-CM | POA: Diagnosis not present

## 2014-01-09 DIAGNOSIS — K5730 Diverticulosis of large intestine without perforation or abscess without bleeding: Secondary | ICD-10-CM | POA: Diagnosis not present

## 2014-01-09 DIAGNOSIS — M810 Age-related osteoporosis without current pathological fracture: Secondary | ICD-10-CM | POA: Insufficient documentation

## 2014-01-09 DIAGNOSIS — Z880 Allergy status to penicillin: Secondary | ICD-10-CM | POA: Insufficient documentation

## 2014-01-09 DIAGNOSIS — K449 Diaphragmatic hernia without obstruction or gangrene: Secondary | ICD-10-CM | POA: Diagnosis not present

## 2014-01-09 LAB — URINALYSIS, ROUTINE W REFLEX MICROSCOPIC
BILIRUBIN URINE: NEGATIVE
Glucose, UA: NEGATIVE mg/dL
Ketones, ur: NEGATIVE mg/dL
Leukocytes, UA: NEGATIVE
Nitrite: NEGATIVE
Protein, ur: NEGATIVE mg/dL
UROBILINOGEN UA: 0.2 mg/dL (ref 0.0–1.0)
pH: 6.5 (ref 5.0–8.0)

## 2014-01-09 LAB — CBC WITH DIFFERENTIAL/PLATELET
BASOS ABS: 0.1 10*3/uL (ref 0.0–0.1)
Basophils Relative: 1 % (ref 0–1)
Eosinophils Absolute: 0.1 10*3/uL (ref 0.0–0.7)
Eosinophils Relative: 2 % (ref 0–5)
HCT: 33.6 % — ABNORMAL LOW (ref 36.0–46.0)
Hemoglobin: 11.2 g/dL — ABNORMAL LOW (ref 12.0–15.0)
Lymphocytes Relative: 40 % (ref 12–46)
Lymphs Abs: 2.5 10*3/uL (ref 0.7–4.0)
MCH: 32.1 pg (ref 26.0–34.0)
MCHC: 33.3 g/dL (ref 30.0–36.0)
MCV: 96.3 fL (ref 78.0–100.0)
Monocytes Absolute: 0.6 10*3/uL (ref 0.1–1.0)
Monocytes Relative: 9 % (ref 3–12)
NEUTROS ABS: 3.1 10*3/uL (ref 1.7–7.7)
NEUTROS PCT: 49 % (ref 43–77)
PLATELETS: 335 10*3/uL (ref 150–400)
RBC: 3.49 MIL/uL — ABNORMAL LOW (ref 3.87–5.11)
RDW: 14.1 % (ref 11.5–15.5)
WBC: 6.3 10*3/uL (ref 4.0–10.5)

## 2014-01-09 LAB — COMPREHENSIVE METABOLIC PANEL
ALT: 14 U/L (ref 0–35)
AST: 23 U/L (ref 0–37)
Albumin: 3.8 g/dL (ref 3.5–5.2)
Alkaline Phosphatase: 63 U/L (ref 39–117)
BILIRUBIN TOTAL: 0.3 mg/dL (ref 0.3–1.2)
BUN: 7 mg/dL (ref 6–23)
CALCIUM: 10 mg/dL (ref 8.4–10.5)
CHLORIDE: 102 meq/L (ref 96–112)
CO2: 30 meq/L (ref 19–32)
Creatinine, Ser: 0.59 mg/dL (ref 0.50–1.10)
GFR calc Af Amer: >90 mL/min (ref >90)
GFR calc non Af Amer: 82 mL/min — ABNORMAL LOW (ref >90)
Glucose, Bld: 105 mg/dL — ABNORMAL HIGH (ref 70–99)
Potassium: 4 mEq/L (ref 3.7–5.3)
SODIUM: 143 meq/L (ref 137–147)
Total Protein: 7.9 g/dL (ref 6.0–8.3)

## 2014-01-09 LAB — URINE MICROSCOPIC-ADD ON

## 2014-01-09 MED ORDER — HYDROMORPHONE HCL PF 1 MG/ML IJ SOLN
0.5000 mg | Freq: Once | INTRAMUSCULAR | Status: AC
  Administered 2014-01-09: 0.5 mg via INTRAVENOUS
  Filled 2014-01-09: qty 1

## 2014-01-09 MED ORDER — HYDROCODONE-ACETAMINOPHEN 5-325 MG PO TABS: 1.0000 | ORAL_TABLET | Freq: Four times a day (QID) | ORAL | Status: DC | PRN

## 2014-01-09 MED ORDER — IOHEXOL 300 MG/ML  SOLN: 50.0000 mL | Freq: Once | INTRAMUSCULAR | Status: DC | PRN

## 2014-01-09 MED ORDER — IOHEXOL 300 MG/ML  SOLN
100.0000 mL | Freq: Once | INTRAMUSCULAR | Status: AC | PRN
  Administered 2014-01-09: 100 mL via INTRAVENOUS

## 2014-01-09 MED ORDER — ONDANSETRON HCL 4 MG/2ML IJ SOLN
4.0000 mg | Freq: Once | INTRAMUSCULAR | Status: AC
  Administered 2014-01-09: 4 mg via INTRAVENOUS
  Filled 2014-01-09: qty 2

## 2014-01-09 NOTE — ED Notes (Signed)
Pt states that she started hurting in her lower back last Friday, was seen by chiropractor on Monday, had xray's performed, no diagnosis seen, was seen by chiropractor again on Thursday, pain has started to radiate around to lower abd area now, denies any n/v/d, urinary symptoms,

## 2014-01-09 NOTE — Discharge Instructions (Signed)
Follow up with your md this week.  Rest at home 2-3 days
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
## 2014-01-12 ENCOUNTER — Telehealth: Payer: Self-pay

## 2014-01-12 NOTE — Telephone Encounter (Signed)
Ok today for toradol 30 mg and depo medrol 40 mg IM nurse visit only

## 2014-01-14 ENCOUNTER — Telehealth: Payer: Self-pay | Admitting: Family Medicine

## 2014-01-14 NOTE — Telephone Encounter (Signed)
See next telephone message. 

## 2014-01-14 NOTE — Telephone Encounter (Signed)
Spoke with daughter who states that she will give mother message of injections that she can receive as nurse visit.  Patient has an ov here on 3/27.  Daughter will ask patient if she would like to come in earlier for injections and call back.

## 2014-01-15 ENCOUNTER — Encounter (INDEPENDENT_AMBULATORY_CARE_PROVIDER_SITE_OTHER): Payer: Self-pay

## 2014-01-15 ENCOUNTER — Encounter: Payer: Self-pay | Admitting: Family Medicine

## 2014-01-15 ENCOUNTER — Ambulatory Visit (INDEPENDENT_AMBULATORY_CARE_PROVIDER_SITE_OTHER): Payer: Medicare Other | Admitting: Family Medicine

## 2014-01-15 ENCOUNTER — Telehealth: Payer: Self-pay | Admitting: Family Medicine

## 2014-01-15 VITALS — BP 132/64 | HR 69 | Resp 16 | Wt 126.0 lb

## 2014-01-15 DIAGNOSIS — M545 Low back pain, unspecified: Secondary | ICD-10-CM

## 2014-01-15 DIAGNOSIS — R221 Localized swelling, mass and lump, neck: Secondary | ICD-10-CM

## 2014-01-15 DIAGNOSIS — M810 Age-related osteoporosis without current pathological fracture: Secondary | ICD-10-CM | POA: Diagnosis not present

## 2014-01-15 DIAGNOSIS — R269 Unspecified abnormalities of gait and mobility: Secondary | ICD-10-CM

## 2014-01-15 DIAGNOSIS — M5440 Lumbago with sciatica, unspecified side: Secondary | ICD-10-CM

## 2014-01-15 DIAGNOSIS — M5430 Sciatica, unspecified side: Secondary | ICD-10-CM

## 2014-01-15 DIAGNOSIS — F411 Generalized anxiety disorder: Secondary | ICD-10-CM

## 2014-01-15 DIAGNOSIS — R2681 Unsteadiness on feet: Secondary | ICD-10-CM

## 2014-01-15 DIAGNOSIS — M542 Cervicalgia: Secondary | ICD-10-CM

## 2014-01-15 DIAGNOSIS — M549 Dorsalgia, unspecified: Secondary | ICD-10-CM | POA: Insufficient documentation

## 2014-01-15 DIAGNOSIS — E785 Hyperlipidemia, unspecified: Secondary | ICD-10-CM

## 2014-01-15 DIAGNOSIS — R220 Localized swelling, mass and lump, head: Secondary | ICD-10-CM

## 2014-01-15 DIAGNOSIS — R933 Abnormal findings on diagnostic imaging of other parts of digestive tract: Secondary | ICD-10-CM | POA: Diagnosis not present

## 2014-01-15 MED ORDER — TRAMADOL HCL 50 MG PO TABS: ORAL_TABLET | ORAL | Status: DC

## 2014-01-15 MED ORDER — KETOROLAC TROMETHAMINE 60 MG/2ML IM SOLN
60.0000 mg | Freq: Once | INTRAMUSCULAR | Status: AC
  Administered 2014-01-15: 60 mg via INTRAMUSCULAR

## 2014-01-15 MED ORDER — RISEDRONATE SODIUM 35 MG PO TABS: 35.0000 mg | ORAL_TABLET | ORAL | Status: DC

## 2014-01-15 NOTE — Assessment & Plan Note (Addendum)
Primarily low back pain identified, pt on exam localizes her symptoms only to the low back, recent abdominal scan is normal , done in the ED on 01/09/2014. Recommend PT, and X ray lumbar, record review shows old compression fracture in low back

## 2014-01-15 NOTE — Assessment & Plan Note (Signed)
Updated lab needed at/ before next visit.  Controlled when last tested 

## 2014-01-15 NOTE — Progress Notes (Signed)
   Subjective:    Patient ID: Kara [name], female    DOB: 1927-08-27, 80 y.o.   MRN: [id number]  HPI 2 week h/o low back pan , worse with standing shoots to lower abd and upper thighs. Went to urgent care, was advised UTI, urine c/s was negative. Called in for shots 2 days ago and i s in the office today to be seen. Pt went to the ED re low back pain on 3/21, she was prescribed hydrocodone , but will not take this as too strong.She has  also recently been seeing a chiropracter and when x ray of low back suggested stated she recently had one and wanted to avoid excessive radiation States pain is limiting her ability to get around, notes unsteady gait, denies falls. Though she has severe osteoporosis has not started medication yet, understands the need to do so, as records reveal and old compression fracture probably since 2013  Review of Systems See HPI Denies recent fever or chills. Denies sinus pressure, nasal congestion, ear pain or sore throat. Denies chest congestion, productive cough or wheezing. Denies chest pains, palpitations and leg swelling Denies abdominal pain, nausea, vomiting,diarrhea or constipation.   Denies dysuria, frequency, hesitancy or incontinence. Denies headaches, seizures, numbness, or tingling. Denies depression, does have increased  anxiety  Denies skin break down or rash.        Objective:   Physical Exam BP 132/64  Pulse 69  Resp 16  Wt 126 lb (57.153 kg)  SpO2 100% Patient alert and oriented and in no cardiopulmonary distress.Pt sitting uncomfortably due to low back pain, also difficulty getting up and moving , also gait is abnormal due to lower extremity pain  HEENT: No facial asymmetry, EOMI, no sinus tenderness,  oropharynx pink and moist.  Neck adequate though reduced  ROM, no JVD, no adenopathy.No palpable mass, tender over TM on left side  Chest: Clear to auscultation bilaterally.  CVS: S1, S2 no murmurs, no S3.  ABD: Soft non tender.  .  Ext: No edema  MS: decreased ROM lumbar  spine, abnormal gait  Skin: Intact, no ulcerations or rash noted.  Psych: Good eye contact, normal affect. Memory intact mildly  anxious not  depressed appearing.  CNS: CN 2-12 intact, power,  normal throughout.        Assessment & Plan:  Back pain without radiation Primarily low back pain identified, pt on exam localizes her symptoms only to the low back, recent abdominal scan is normal , done in the ED on 01/09/2014. Recommend PT, and X ray lumbar, record review shows old compression fracture in low back  Unsteady gait reprots increased pain with ambulation and unsteady gait, refer to pT, fall precautions also discussed  HYPERLIPIDEMIA Updated lab needed at/ before next visit. Controlled when last tested  GENERALIZED ANXIETY DISORDER Continue klonopin, as before  NECK PAIN Upcoming appt with pT in April, will have gait and back issues addressed at that time also  OSTEOPOROSIS Pt and daughter made aware of the severity of her disease, she is to start weekly actonel, no steroid for pain, toradol administered at visit.

## 2014-01-15 NOTE — Telephone Encounter (Signed)
Pls contact her daughter [name] who was with her today, explain that chiropracter 's office could supply no xray report per their protocol, it is not a written report. Since the back pain is local , only in low back and no indication for an MRI at this time.PLease encourage her to   work with medication and PT t  She can be referred to ortho fo her choice re back pain if she wishes, but I don't think this will be benficial at this time

## 2014-01-15 NOTE — Assessment & Plan Note (Signed)
Continue klonopin, as before

## 2014-01-15 NOTE — Assessment & Plan Note (Signed)
Pt and daughter made aware of the severity of her disease, she is to start weekly actonel, no steroid for pain, toradol administered at visit.

## 2014-01-15 NOTE — Assessment & Plan Note (Signed)
Upcoming appt with pT in April, will have gait and back issues addressed at that time also

## 2014-01-15 NOTE — Telephone Encounter (Signed)
Patients daughters aware.

## 2014-01-15 NOTE — Assessment & Plan Note (Signed)
reprots increased pain with ambulation and unsteady gait, refer to pT, fall precautions also discussed

## 2014-01-15 NOTE — Patient Instructions (Addendum)
F/u in 4  weeks , call if you need me before  New for bone building is once weekly actonel  Toradol in office today, new for pain is tramadol one twice daily if needed and if able to tolerate, or just one daily if that relieves pain. Take tylenol 325mg  one daily to help with pain also please  Xray of low back today please  You are referred for PT for gait instability in April, use cane when walking please  You will be referred to ENT for ear and neck pain eval  Hope you feel better soon  Fall Prevention and Home Safety Falls cause injuries and can affect all age groups. It is possible to prevent falls.  HOW TO PREVENT FALLS  Wear shoes with rubber soles that do not have an opening for your toes.  Keep the inside and outside of your house well lit.  Use night lights throughout your home.  Remove clutter from floors.  Clean up floor spills.  Remove throw rugs or fasten them to the floor with carpet tape.  Do not place electrical cords across pathways.  Put grab bars by your tub, shower, and toilet. Do not use towel bars as grab bars.  Put handrails on both sides of the stairway. Fix loose handrails.  Do not climb on stools or stepladders, if possible.  Do not wax your floors.  Repair uneven or unsafe sidewalks, walkways, or stairs.  Keep items you use a lot within reach.  Be aware of pets.  Keep emergency numbers next to the telephone.  Put smoke detectors in your home and near bedrooms. Ask your doctor what other things you can do to prevent falls. Document Released: 08/04/2009 Document Revised: 04/08/2012 Document Reviewed: 01/08/2012 [facility] Patient Information 2014 [place], [place].

## 2014-01-20 ENCOUNTER — Other Ambulatory Visit: Payer: Self-pay

## 2014-01-20 ENCOUNTER — Telehealth: Payer: Self-pay | Admitting: Family Medicine

## 2014-01-20 DIAGNOSIS — N362 Urethral caruncle: Secondary | ICD-10-CM | POA: Diagnosis not present

## 2014-01-20 DIAGNOSIS — M542 Cervicalgia: Secondary | ICD-10-CM

## 2014-01-20 DIAGNOSIS — N819 Female genital prolapse, unspecified: Secondary | ICD-10-CM | POA: Diagnosis not present

## 2014-01-20 MED ORDER — ALENDRONATE SODIUM 70 MG PO TABS: 70.0000 mg | ORAL_TABLET | ORAL | Status: DC

## 2014-01-20 NOTE — Telephone Encounter (Signed)
pls enter referral I will sign eval neck and throat pain

## 2014-01-20 NOTE — Telephone Encounter (Signed)
Referral entered  

## 2014-01-20 NOTE — Addendum Note (Signed)
Addended by: Eual Fines on: 01/20/2014 04:00 PM   Modules accepted: Orders

## 2014-01-21 ENCOUNTER — Telehealth: Payer: Self-pay

## 2014-01-21 NOTE — Telephone Encounter (Signed)
Throat still hurting on left side and under her ear and seems to hurt worse when she takes the tramadol. I advised to take a tylenol with the tramadol but she said that's not helping either. She has appt with ENT April 10th but wants something done now. Had some old cipro that she didn't take and wanted to know if she should take those. Please advise

## 2014-01-21 NOTE — Telephone Encounter (Signed)
Spoke with patient and she is aware.  Will increase salt water gargles.

## 2014-01-21 NOTE — Telephone Encounter (Signed)
pls let her know I did not see any evidence of infection so would not advise her to do that. Salt water gargles 3 times daily and tylenol I recommend

## 2014-01-29 DIAGNOSIS — G501 Atypical facial pain: Secondary | ICD-10-CM | POA: Diagnosis not present

## 2014-01-29 DIAGNOSIS — M542 Cervicalgia: Secondary | ICD-10-CM | POA: Diagnosis not present

## 2014-01-29 DIAGNOSIS — H9209 Otalgia, unspecified ear: Secondary | ICD-10-CM | POA: Diagnosis not present

## 2014-01-29 DIAGNOSIS — G9001 Carotid sinus syncope: Secondary | ICD-10-CM | POA: Diagnosis not present

## 2014-02-14 ENCOUNTER — Other Ambulatory Visit: Payer: Self-pay | Admitting: Family Medicine

## 2014-02-22 ENCOUNTER — Telehealth: Payer: Self-pay

## 2014-02-22 NOTE — Telephone Encounter (Signed)
Foot feels better. Has a tick bite that she has been applying antibiotic ointment to several times a day. Advised to continue and call back if the area increases in size or she develops more redness around the area

## 2014-02-25 DIAGNOSIS — C44319 Basal cell carcinoma of skin of other parts of face: Secondary | ICD-10-CM | POA: Diagnosis not present

## 2014-02-25 DIAGNOSIS — D235 Other benign neoplasm of skin of trunk: Secondary | ICD-10-CM | POA: Diagnosis not present

## 2014-03-24 ENCOUNTER — Ambulatory Visit (INDEPENDENT_AMBULATORY_CARE_PROVIDER_SITE_OTHER): Payer: Medicare Other | Admitting: Family Medicine

## 2014-03-24 VITALS — BP 140/70 | HR 77 | Resp 16 | Wt 121.1 lb

## 2014-03-24 DIAGNOSIS — K219 Gastro-esophageal reflux disease without esophagitis: Secondary | ICD-10-CM

## 2014-03-24 DIAGNOSIS — E785 Hyperlipidemia, unspecified: Secondary | ICD-10-CM

## 2014-03-24 DIAGNOSIS — T148XXA Other injury of unspecified body region, initial encounter: Secondary | ICD-10-CM | POA: Diagnosis not present

## 2014-03-24 DIAGNOSIS — K5289 Other specified noninfective gastroenteritis and colitis: Secondary | ICD-10-CM | POA: Diagnosis not present

## 2014-03-24 DIAGNOSIS — IMO0002 Reserved for concepts with insufficient information to code with codable children: Secondary | ICD-10-CM

## 2014-03-24 DIAGNOSIS — Z1211 Encounter for screening for malignant neoplasm of colon: Secondary | ICD-10-CM | POA: Diagnosis not present

## 2014-03-24 DIAGNOSIS — F411 Generalized anxiety disorder: Secondary | ICD-10-CM | POA: Diagnosis not present

## 2014-03-24 DIAGNOSIS — Z23 Encounter for immunization: Secondary | ICD-10-CM

## 2014-03-24 DIAGNOSIS — J309 Allergic rhinitis, unspecified: Secondary | ICD-10-CM

## 2014-03-24 DIAGNOSIS — K529 Noninfective gastroenteritis and colitis, unspecified: Secondary | ICD-10-CM

## 2014-03-24 LAB — HEMOCCULT GUIAC POC 1CARD (OFFICE): Fecal Occult Blood, POC: NEGATIVE

## 2014-03-24 NOTE — Patient Instructions (Signed)
F/u in 4 month, call if you need me before  Your rectal exam today showed soft brown stool, no mucus, or blood Ear exam is normal TdAP today due to cut  On left thigh   Pls reconsider the shingles vacccine , you need this  Follow Brat diet till your stomach feels better  Diet for Diarrhea, Adult Frequent, runny stools (diarrhea) may be caused or worsened by food or drink. Diarrhea may be relieved by changing your diet. Since diarrhea can last up to 7 days, it is easy for you to lose too much fluid from the body and become dehydrated. Fluids that are lost need to be replaced. Along with a modified diet, make sure you drink enough fluids to keep your urine clear or pale yellow. DIET INSTRUCTIONS  Ensure adequate fluid intake (hydration): have 1 cup (8 oz) of fluid for each diarrhea episode. Avoid fluids that contain simple sugars or sports drinks, fruit juices, whole milk products, and sodas. Your urine should be clear or pale yellow if you are drinking enough fluids. Hydrate with an oral rehydration solution that you can purchase at pharmacies, retail stores, and online. You can prepare an oral rehydration solution at home by mixing the following ingredients together:    tsp table salt.   tsp baking soda.   tsp salt substitute containing potassium chloride.  1  tablespoons sugar.  1 L (34 oz) of water.  Certain foods and beverages may increase the speed at which food moves through the gastrointestinal (GI) tract. These foods and beverages should be avoided and include:  Caffeinated and alcoholic beverages.  High-fiber foods, such as raw fruits and vegetables, nuts, seeds, and whole grain breads and cereals.  Foods and beverages sweetened with sugar alcohols, such as xylitol, sorbitol, and mannitol.  Some foods may be well tolerated and may help thicken stool including:  Starchy foods, such as rice, toast, pasta, low-sugar cereal, oatmeal, grits, baked potatoes, crackers, and  bagels.   Bananas.   Applesauce.  Add probiotic-rich foods to help increase healthy bacteria in the GI tract, such as yogurt and fermented milk products. RECOMMENDED FOODS AND BEVERAGES Starches Choose foods with less than 2 g of fiber per serving.  Recommended:  White, [place], and pita breads, plain rolls, buns, bagels. Plain muffins, matzo. Soda, saltine, or graham crackers. Pretzels, melba toast, zwieback. Cooked cereals made with water: cornmeal, farina, cream cereals. Dry cereals: refined corn, wheat, rice. Potatoes prepared any way without skins, refined macaroni, spaghetti, noodles, refined rice.  Avoid:  Bread, rolls, or crackers made with whole wheat, multi-grains, rye, bran seeds, nuts, or coconut. Corn tortillas or taco shells. Cereals containing whole grains, multi-grains, bran, coconut, nuts, raisins. Cooked or dry oatmeal. Coarse wheat cereals, granola. Cereals advertised as "high-fiber." Potato skins. Whole grain pasta, wild or brown rice. Popcorn. Sweet potatoes, yams. Sweet rolls, doughnuts, waffles, pancakes, sweet breads. Vegetables  Recommended: Strained tomato and vegetable juices. Most well-cooked and canned vegetables without seeds. Fresh: Tender lettuce, cucumber without the skin, cabbage, spinach, bean sprouts.  Avoid: Fresh, cooked, or canned: Artichokes, baked beans, beet greens, broccoli, Brussels sprouts, corn, kale, legumes, peas, sweet potatoes. Cooked: Green or red cabbage, spinach. Avoid large servings of any vegetables because vegetables shrink when cooked, and they contain more fiber per serving than fresh vegetables. Fruit  Recommended: Cooked or canned: Apricots, applesauce, cantaloupe, cherries, fruit cocktail, grapefruit, grapes, kiwi, mandarin oranges, peaches, pears, plums, watermelon. Fresh: Apples without skin, ripe banana, grapes, cantaloupe, cherries, grapefruit, peaches,  oranges, plums. Keep servings limited to  cup or 1 piece.  Avoid: Fresh:  Apples with skin, apricots, mangoes, pears, raspberries, strawberries. Prune juice, stewed or dried prunes. Dried fruits, raisins, dates. Large servings of all fresh fruits. Protein  Recommended: Ground or well-cooked tender beef, ham, veal, lamb, pork, or poultry. Eggs. Fish, oysters, shrimp, lobster, other seafoods. Liver, organ meats.  Avoid: Tough, fibrous meats with gristle. Peanut butter, smooth or chunky. Cheese, nuts, seeds, legumes, dried peas, beans, lentils. Dairy  Recommended: Yogurt, lactose-free milk, kefir, drinkable yogurt, buttermilk, soy milk, or plain hard cheese.  Avoid: Milk, chocolate milk, beverages made with milk, such as milkshakes. Soups  Recommended: Bouillon, broth, or soups made from allowed foods. Any strained soup.  Avoid: Soups made from vegetables that are not allowed, cream or milk-based soups. Desserts and Sweets  Recommended: Sugar-free gelatin, sugar-free frozen ice pops made without sugar alcohol.  Avoid: Plain cakes and cookies, pie made with fruit, pudding, custard, cream pie. Gelatin, fruit, ice, sherbet, frozen ice pops. Ice cream, ice milk without nuts. Plain hard candy, honey, jelly, molasses, syrup, sugar, chocolate syrup, gumdrops, marshmallows. Fats and Oils  Recommended: Limit fats to less than 8 tsp per day.  Avoid: Seeds, nuts, olives, avocados. Margarine, butter, cream, mayonnaise, salad oils, plain salad dressings. Plain gravy, crisp bacon without rind. Beverages  Recommended: Water, decaffeinated teas, oral rehydration solutions, sugar-free beverages not sweetened with sugar alcohols.  Avoid: Fruit juices, caffeinated beverages (coffee, tea, soda), alcohol, sports drinks, or lemon-lime soda. Condiments  Recommended: Ketchup, mustard, horseradish, vinegar, cocoa powder. Spices in moderation: allspice, basil, bay leaves, celery powder or leaves, cinnamon, cumin powder, curry powder, ginger, mace, marjoram, onion or garlic powder,  oregano, paprika, parsley flakes, ground pepper, rosemary, sage, savory, tarragon, thyme, turmeric.  Avoid: Coconut, honey. Document Released: 12/29/2003 Document Revised: 07/02/2012 Document Reviewed: 02/22/2012 [facility] Patient Information 2014 [place].

## 2014-03-28 ENCOUNTER — Encounter: Payer: Self-pay | Admitting: Family Medicine

## 2014-03-28 ENCOUNTER — Telehealth: Payer: Self-pay | Admitting: Family Medicine

## 2014-03-28 DIAGNOSIS — R7302 Impaired glucose tolerance (oral): Secondary | ICD-10-CM

## 2014-03-28 DIAGNOSIS — E785 Hyperlipidemia, unspecified: Secondary | ICD-10-CM

## 2014-03-28 DIAGNOSIS — K529 Noninfective gastroenteritis and colitis, unspecified: Secondary | ICD-10-CM | POA: Insufficient documentation

## 2014-03-28 DIAGNOSIS — IMO0002 Reserved for concepts with insufficient information to code with codable children: Secondary | ICD-10-CM | POA: Insufficient documentation

## 2014-03-28 NOTE — Assessment & Plan Note (Signed)
Mild symptoms , uses OTC med as needed

## 2014-03-28 NOTE — Progress Notes (Signed)
   Subjective:    Patient ID: Kara [name], female    DOB: 15-Jan-1927, 80 y.o.   MRN: [id number]  HPI The PT is here for follow up and re-evaluation of chronic medical conditions, medication management and review of any available recent lab and radiology data.  Preventive health is updated, specifically  Cancer screening and Immunization.   Questions or concerns regarding consultations or procedures which the PT has had in the interim are  addressed. The PT denies any adverse reactions to current medications since the last visit.  5 day h/o acute nausea and loose stools, green with mucus, positive sick contact with her daughter with similar symptoms       Review of Systems See HPI Denies recent fever or chills. Denies sinus pressure, nasal congestion, ear pain or sore [email or web address].info/ allergy symptoms intermittently. C/o bilateral ear pressure, no drainage Denies chest congestion, productive cough or wheezing. Denies chest pains, palpitations and leg swelling  Denies dysuria, frequency, hesitancy or incontinence. Denies uncontrolled  joint pain, swelling and limitation in mobility. Denies headaches, seizures, numbness, or tingling. Denies depression, uncontrolled anxiety or insomnia. C/o multiple tick bites and has cut on left anterior thigh where she was injured approx 3 days ago, no pus or purulent drainage noted from the site       Objective:   Physical Exam BP 140/70  Pulse 77  Resp 16  Wt 121 lb 1.9 oz (54.94 kg)  SpO2 100% Patient alert and oriented and in no cardiopulmonary distress.  HEENT: No facial asymmetry, EOMI,   oropharynx pink and moist.  Neck decreased ROM no JVD, no mass.TM clear with good light reflex bilaterally  Chest: Clear to auscultation bilaterally.  CVS: S1, S2 no murmurs, no S3.  ABD: Soft , superficial diffuse tendernes, no guarding or rebound. No organomegaly or masses palpated. Hyperactive bowel sounds Rectal: no mucus or blood in stool, small  hemorrhoids.   Ext: No edema  MS: Adequate though reduced  ROM spine, shoulders, hips and knees.  Skin: I3 cm open laceration to left anterior thigh no erythema or purulent drainage  Psych: Good eye contact, normal affect. Memory intact not anxious or depressed appearing.  CNS: CN 2-12 intact, power,  normal throughout.no focal deficits noted.        Assessment & Plan:  Laceration Clean laceration on left anterior thoigh , length approx  3 cm. Pt to continue to keep clean and open till closed fully, healing has begun, TdAP today  HYPERLIPIDEMIA Updated lab needed at/ before next visit. Lab past due  GENERALIZED ANXIETY DISORDER Uses klonopin as needed, and sparingly. Improved level of independent function, less anxious  And has been doing well.  ALLERGIC RHINITIS Mild symptoms , uses OTC med as needed  GERD (gastroesophageal reflux disease) Controlled, no change in medication

## 2014-03-28 NOTE — Assessment & Plan Note (Signed)
Clean laceration on left anterior thoigh , length approx  3 cm. Pt to continue to keep clean and open till closed fully, healing has begun, TdAP today

## 2014-03-28 NOTE — Assessment & Plan Note (Addendum)
Uses klonopin as needed, and sparingly. Improved level of independent function, less anxious  And has been doing well.

## 2014-03-28 NOTE — Assessment & Plan Note (Signed)
Updated lab needed at/ before next visit. Lab past due

## 2014-03-28 NOTE — Assessment & Plan Note (Signed)
Controlled, no change in medication  

## 2014-03-28 NOTE — Telephone Encounter (Signed)
Pls contact pt , she needs fasting lipid and hepatic and hBA1C for 4 month f/u visit pls needs o be ordered also, thanks

## 2014-03-29 ENCOUNTER — Telehealth: Payer: Self-pay | Admitting: Internal Medicine

## 2014-03-29 DIAGNOSIS — Z85828 Personal history of other malignant neoplasm of skin: Secondary | ICD-10-CM | POA: Diagnosis not present

## 2014-03-29 DIAGNOSIS — L570 Actinic keratosis: Secondary | ICD-10-CM | POA: Diagnosis not present

## 2014-03-29 NOTE — Telephone Encounter (Signed)
Pt called today asking to speak with someone (nurse) regarding her having mucus in her stools and to please try calling her back before 145pm today if possible. [phone]

## 2014-03-29 NOTE — Telephone Encounter (Signed)
Labs ordered and patient aware that she should have drawn before oct visit

## 2014-03-29 NOTE — Addendum Note (Signed)
Addended by: Denman George B on: 03/29/2014 09:54 AM   Modules accepted: Orders

## 2014-03-30 NOTE — Telephone Encounter (Signed)
I spoke with pt- we have not seen her since 05/2012. She said she was having some type of virus last week and now she is constipated. Per pt, she called Dr. Camillia Herter office too and was told she could take some MOM to help her get her bowels moving again. Pt stated she wanted to try it and see if it will work for her and if she continues to have problems she will call back as needed.

## 2014-04-02 NOTE — Telephone Encounter (Signed)
Patient requested Xray of head - order submitted and Xray done 12/04/13 per PCP

## 2014-05-17 ENCOUNTER — Other Ambulatory Visit: Payer: Self-pay | Admitting: Family Medicine

## 2014-05-31 ENCOUNTER — Other Ambulatory Visit: Payer: Self-pay | Admitting: Family Medicine

## 2014-05-31 ENCOUNTER — Other Ambulatory Visit: Payer: Self-pay | Admitting: Obstetrics and Gynecology

## 2014-06-08 ENCOUNTER — Other Ambulatory Visit: Payer: Self-pay | Admitting: Family Medicine

## 2014-06-22 ENCOUNTER — Ambulatory Visit (INDEPENDENT_AMBULATORY_CARE_PROVIDER_SITE_OTHER): Payer: Medicare Other | Admitting: Family Medicine

## 2014-06-22 ENCOUNTER — Encounter (INDEPENDENT_AMBULATORY_CARE_PROVIDER_SITE_OTHER): Payer: Self-pay

## 2014-06-22 ENCOUNTER — Encounter: Payer: Self-pay | Admitting: Family Medicine

## 2014-06-22 VITALS — BP 138/68 | HR 66 | Resp 16 | Ht 62.0 in | Wt 115.4 lb

## 2014-06-22 DIAGNOSIS — R2681 Unsteadiness on feet: Secondary | ICD-10-CM

## 2014-06-22 DIAGNOSIS — R269 Unspecified abnormalities of gait and mobility: Secondary | ICD-10-CM

## 2014-06-22 DIAGNOSIS — R634 Abnormal weight loss: Secondary | ICD-10-CM | POA: Diagnosis not present

## 2014-06-22 DIAGNOSIS — K219 Gastro-esophageal reflux disease without esophagitis: Secondary | ICD-10-CM

## 2014-06-22 DIAGNOSIS — R1013 Epigastric pain: Secondary | ICD-10-CM

## 2014-06-22 DIAGNOSIS — K3189 Other diseases of stomach and duodenum: Secondary | ICD-10-CM

## 2014-06-22 DIAGNOSIS — R1011 Right upper quadrant pain: Secondary | ICD-10-CM

## 2014-06-22 LAB — CBC
HCT: 35 % — ABNORMAL LOW (ref 36.0–46.0)
Hemoglobin: 11.7 g/dL — ABNORMAL LOW (ref 12.0–15.0)
MCH: 31 pg (ref 26.0–34.0)
MCHC: 33.4 g/dL (ref 30.0–36.0)
MCV: 92.8 fL (ref 78.0–100.0)
PLATELETS: 347 10*3/uL (ref 150–400)
RBC: 3.77 MIL/uL — ABNORMAL LOW (ref 3.87–5.11)
RDW: 15 % (ref 11.5–15.5)
WBC: 6.7 10*3/uL (ref 4.0–10.5)

## 2014-06-22 LAB — COMPREHENSIVE METABOLIC PANEL
ALT: 11 U/L (ref 0–35)
AST: 19 U/L (ref 0–37)
Albumin: 4.3 g/dL (ref 3.5–5.2)
Alkaline Phosphatase: 46 U/L (ref 39–117)
BUN: 8 mg/dL (ref 6–23)
CALCIUM: 10.5 mg/dL (ref 8.4–10.5)
CHLORIDE: 104 meq/L (ref 96–112)
CO2: 29 meq/L (ref 19–32)
Creat: 0.65 mg/dL (ref 0.50–1.10)
Glucose, Bld: 105 mg/dL — ABNORMAL HIGH (ref 70–99)
POTASSIUM: 5.4 meq/L — AB (ref 3.5–5.3)
SODIUM: 142 meq/L (ref 135–145)
TOTAL PROTEIN: 7 g/dL (ref 6.0–8.3)
Total Bilirubin: 0.5 mg/dL (ref 0.2–1.2)

## 2014-06-22 LAB — AMYLASE: AMYLASE: 47 U/L (ref 0–105)

## 2014-06-22 LAB — LIPASE: Lipase: 46 U/L (ref 0–75)

## 2014-06-22 NOTE — Patient Instructions (Addendum)
F/U as before  Labs today cmp,  CBC, amylase and lipase  You will be referred for an [place] of your gallbladder , hopefully for morning of Sept 10  We will try to arrange for ensure supplements, also for meals on wheels  Check the pharmacy for alert system in case of a fall  Fasting lipid before next visit only  Use tylenol for neck pain

## 2014-06-27 DIAGNOSIS — R634 Abnormal weight loss: Secondary | ICD-10-CM | POA: Insufficient documentation

## 2014-06-27 NOTE — Assessment & Plan Note (Signed)
Pt provided with information again to get an alert necklace in the event of a fall, daughter to follow through on this with her [employer] safety info again provided at visit also

## 2014-06-27 NOTE — Progress Notes (Signed)
   Subjective:    Patient ID: [name], female    DOB: 05/16/1927, 80 y.o.   MRN: [id number]  HPI  3 week h/o abdominal pain, mainly central and RUQ, Does have some episodes of belching and bloating and notes fatty food intolerance. Nausea intermittently, denies  Vomiting, no change in  BM , and no visible blood in stool or black stool Reports poor appetite and intake with weight loss. No interest in preparing food for herself only. Reports instability at times and near fall, has questions about an alert system, and I direct her with her daughter sharon who accompanies her, to the local supplier, she had got a phone call from an unknown on the TV  Review of Systems See HPI Denies recent fever or chills. Denies sinus pressure, nasal congestion, ear pain or sore throat. Denies chest congestion, productive cough or wheezing. Denies chest pains, palpitations and leg swelling  Denies dysuria, frequency, hesitancy or incontinence. Denies headaches, seizures, numbness, or tingling. Denies depression,uncontrolled anxiety or insomnia. Denies skin break down or rash.        Objective:   Physical Exam BP 138/68  Pulse 66  Resp 16  Ht 5\' 2"  (1.575 m)  Wt 115 lb 6.4 oz (52.345 kg)  BMI 21.10 kg/m2  SpO2 100% Patient alert and oriented and in no cardiopulmonary distress.  HEENT: No facial asymmetry, EOMI,   oropharynx pink and moist.  Neck supple no JVD, no mass.  Chest: Clear to auscultation bilaterally.  CVS: S1, S2 no murmurs, no S3.Regular rate.  ABD: Soft  Epigastric and RUQ tenderness, no guarding or rebound, normal BS.   Ext: No edema  MS: Adequate though reduced  ROM spine,   shoulders, hips and knees.  Skin: Intact, no ulcerations or rash noted.  Psych: Good eye contact, normal affect. Memory intact not anxious or depressed appearing.  CNS: CN 2-12 intact, power,  normal throughout.no focal deficits noted.        Assessment & Plan:  RUQ pain 4 week h/o  RUQ pain and bloating with decreased appetite and weight loss, [place] needed to evaluate for gall stones. Labs today to exclude pancreatitis and dehydration  Unsteady gait Pt provided with information again to get an alert necklace in the event of a fall, daughter to follow through on this with her [employer] safety info again provided at visit also  Recent unintentional weight loss over several months Seven pound weight loss since Dec 2014, with limited access to prepared food. Will refer for Ensure through [facility], also info provided for meals on wheels in the area. Daughter and pt are interested in this  GERD (gastroesophageal reflux disease) Continue  omeprazole as before. May need GI re eval if symptoms persist and there is no gallstone present

## 2014-06-27 NOTE — Assessment & Plan Note (Signed)
4 week h/o RUQ pain and bloating with decreased appetite and weight loss, [place] needed to evaluate for gall stones. Labs today to exclude pancreatitis and dehydration

## 2014-06-27 NOTE — Assessment & Plan Note (Signed)
Continue  omeprazole as before. May need GI re eval if symptoms persist and there is no gallstone present

## 2014-06-27 NOTE — Assessment & Plan Note (Signed)
Seven pound weight loss since Dec 2014, with limited access to prepared food. Will refer for Ensure through [facility], also info provided for meals on wheels in the area. Daughter and pt are interested in this

## 2014-06-29 DIAGNOSIS — H04129 Dry eye syndrome of unspecified lacrimal gland: Secondary | ICD-10-CM | POA: Diagnosis not present

## 2014-06-29 DIAGNOSIS — H353190 Nonexudative age-related macular degeneration, unspecified eye, stage unspecified: Secondary | ICD-10-CM | POA: Diagnosis not present

## 2014-06-29 DIAGNOSIS — Z961 Presence of intraocular lens: Secondary | ICD-10-CM | POA: Diagnosis not present

## 2014-07-05 ENCOUNTER — Ambulatory Visit (HOSPITAL_COMMUNITY)
Admission: RE | Admit: 2014-07-05 | Discharge: 2014-07-05 | Disposition: A | Discharge status: Home / Self Care | Payer: Medicare Other | Source: Ambulatory Visit | Attending: Family Medicine | Admitting: Family Medicine

## 2014-07-05 DIAGNOSIS — R1011 Right upper quadrant pain: Secondary | ICD-10-CM | POA: Diagnosis not present

## 2014-07-05 DIAGNOSIS — R109 Unspecified abdominal pain: Secondary | ICD-10-CM | POA: Diagnosis not present

## 2014-07-08 DIAGNOSIS — L570 Actinic keratosis: Secondary | ICD-10-CM | POA: Diagnosis not present

## 2014-07-19 ENCOUNTER — Encounter (INDEPENDENT_AMBULATORY_CARE_PROVIDER_SITE_OTHER): Payer: Medicare Other | Admitting: Ophthalmology

## 2014-07-19 DIAGNOSIS — S0550XA Penetrating wound with foreign body of unspecified eyeball, initial encounter: Secondary | ICD-10-CM | POA: Diagnosis not present

## 2014-07-19 DIAGNOSIS — E785 Hyperlipidemia, unspecified: Secondary | ICD-10-CM | POA: Diagnosis not present

## 2014-07-19 DIAGNOSIS — H43819 Vitreous degeneration, unspecified eye: Secondary | ICD-10-CM

## 2014-07-19 DIAGNOSIS — R7309 Other abnormal glucose: Secondary | ICD-10-CM | POA: Diagnosis not present

## 2014-07-20 LAB — LIPID PANEL
Cholesterol: 172 mg/dL (ref 0–200)
HDL: 62 mg/dL (ref >39)
LDL CALC: 85 mg/dL (ref 0–99)
Total CHOL/HDL Ratio: 2.8 Ratio
Triglycerides: 127 mg/dL (ref <150)
VLDL: 25 mg/dL (ref 0–40)

## 2014-07-20 LAB — HEPATIC FUNCTION PANEL
ALBUMIN: 4.3 g/dL (ref 3.5–5.2)
ALK PHOS: 48 U/L (ref 39–117)
ALT: 13 U/L (ref 0–35)
AST: 22 U/L (ref 0–37)
BILIRUBIN TOTAL: 0.5 mg/dL (ref 0.2–1.2)
Bilirubin, Direct: 0.1 mg/dL (ref 0.0–0.3)
Indirect Bilirubin: 0.4 mg/dL (ref 0.2–1.2)
Total Protein: 7.1 g/dL (ref 6.0–8.3)

## 2014-07-20 LAB — HEMOGLOBIN A1C
HEMOGLOBIN A1C: 6 % — AB (ref <5.7)
MEAN PLASMA GLUCOSE: 126 mg/dL — AB (ref <117)

## 2014-07-28 ENCOUNTER — Encounter (INDEPENDENT_AMBULATORY_CARE_PROVIDER_SITE_OTHER): Payer: Self-pay

## 2014-07-28 ENCOUNTER — Ambulatory Visit (INDEPENDENT_AMBULATORY_CARE_PROVIDER_SITE_OTHER): Payer: Medicare Other | Admitting: Family Medicine

## 2014-07-28 ENCOUNTER — Encounter: Payer: Self-pay | Admitting: Family Medicine

## 2014-07-28 VITALS — BP 142/60 | HR 79 | Resp 16 | Ht 62.0 in | Wt 115.1 lb

## 2014-07-28 DIAGNOSIS — K2100 Gastro-esophageal reflux disease with esophagitis, without bleeding: Secondary | ICD-10-CM

## 2014-07-28 DIAGNOSIS — R7302 Impaired glucose tolerance (oral): Secondary | ICD-10-CM

## 2014-07-28 DIAGNOSIS — M810 Age-related osteoporosis without current pathological fracture: Secondary | ICD-10-CM | POA: Diagnosis not present

## 2014-07-28 DIAGNOSIS — Z23 Encounter for immunization: Secondary | ICD-10-CM

## 2014-07-28 DIAGNOSIS — J302 Other seasonal allergic rhinitis: Secondary | ICD-10-CM | POA: Diagnosis not present

## 2014-07-28 DIAGNOSIS — E785 Hyperlipidemia, unspecified: Secondary | ICD-10-CM

## 2014-07-28 NOTE — Patient Instructions (Signed)
Annual wellness mid Feb , call if you need me before  Labs are excellent, no med change  Fl;u vaccine today

## 2014-08-15 DIAGNOSIS — Z23 Encounter for immunization: Secondary | ICD-10-CM | POA: Insufficient documentation

## 2014-08-15 NOTE — Progress Notes (Signed)
   Subjective:    Patient ID: Kara [name], female    DOB: 1927/08/11, 80 y.o.   MRN: [id number]  HPI The PT is here for follow up and re-evaluation of chronic medical conditions, medication management and review of any available recent lab and radiology data.  Preventive health is updated, specifically  Immunization.   Questions or concerns regarding consultations or procedures which the PT has had in the interim are  addressed. The PT denies any adverse reactions to current medications since the last visit.  There are no new concerns.  There are no specific complaints       Review of Systems See HPI Denies recent fever or chills. Denies sinus pressure, nasal congestion, ear pain or sore throat. Denies chest congestion, productive cough or wheezing. Denies chest pains, palpitations and leg swelling Denies abdominal pain, nausea, vomiting,diarrhea or constipation.   Denies dysuria, frequency, hesitancy or incontinence. Denies joint pain, swelling and limitation in mobility. Denies headaches, seizures, numbness, or tingling. Denies depression, anxiety or insomnia. Denies skin break down or rash.        Objective:   Physical Exam BP 142/60  Pulse 79  Resp 16  Ht 5\' 2"  (1.575 m)  Wt 115 lb 1.9 oz (52.218 kg)  BMI 21.05 kg/m2  SpO2 99% Patient alert and oriented and in no cardiopulmonary distress.  HEENT: No facial asymmetry, EOMI,   oropharynx pink and moist.  Neck supple no JVD, no mass.  Chest: Clear to auscultation bilaterally.  CVS: S1, S2 no murmurs, no S3.Regular rate.  ABD: Soft non tender.   Ext: No edema  MS: Adequate though reduced  ROM spine, shoulders, hips and knees.  Skin: Intact, no ulcerations or rash noted.  Psych: Good eye contact, normal affect. Memory intact not anxious or depressed appearing.  CNS: CN 2-12 intact, power,  normal throughout.no focal deficits noted.        Assessment & Plan:  Hyperlipidemia LDL goal <100 Controlled,  no change in medication Hyperlipidemia:Low fat diet discussed and encouraged.    Senile osteoporosis Need to further address the option of IV bisphosphonate annually,  GERD (gastroesophageal reflux disease) Controlled, no change in medication   Seasonal allergic rhinitis Controlled, no change in medication   Need for prophylactic vaccination and inoculation against influenza Vaccine administered at visit.   IGT (impaired glucose tolerance) Patient educated about the importance of limiting  Carbohydrate intake , the need to commit to daily physical activity for a minimum of 30 minutes , and to commit weight loss. The fact that changes in all these areas will reduce or eliminate all together the development of diabetes is stressed.

## 2014-08-15 NOTE — Assessment & Plan Note (Signed)
Vaccine administered at visit.  

## 2014-08-15 NOTE — Assessment & Plan Note (Signed)
Controlled, no change in medication  

## 2014-08-15 NOTE — Assessment & Plan Note (Signed)
Need to further address the option of IV bisphosphonate annually,

## 2014-08-15 NOTE — Assessment & Plan Note (Signed)
Patient educated about the importance of limiting  Carbohydrate intake , the need to commit to daily physical activity for a minimum of 30 minutes , and to commit weight loss. The fact that changes in all these areas will reduce or eliminate all together the development of diabetes is stressed.    

## 2014-08-15 NOTE — Assessment & Plan Note (Signed)
Controlled, no change in medication Hyperlipidemia:Low fat diet discussed and encouraged.  \ 

## 2014-08-17 ENCOUNTER — Other Ambulatory Visit: Payer: Self-pay | Admitting: Family Medicine

## 2014-08-19 DIAGNOSIS — Z719 Counseling, unspecified: Secondary | ICD-10-CM | POA: Diagnosis not present

## 2014-08-19 DIAGNOSIS — N390 Urinary tract infection, site not specified: Secondary | ICD-10-CM | POA: Diagnosis not present

## 2014-08-24 DIAGNOSIS — H3531 Nonexudative age-related macular degeneration: Secondary | ICD-10-CM | POA: Diagnosis not present

## 2014-08-24 DIAGNOSIS — Z961 Presence of intraocular lens: Secondary | ICD-10-CM | POA: Diagnosis not present

## 2014-11-15 ENCOUNTER — Other Ambulatory Visit: Payer: Self-pay | Admitting: Family Medicine

## 2014-12-07 ENCOUNTER — Other Ambulatory Visit: Payer: Self-pay | Admitting: Family Medicine

## 2015-01-15 ENCOUNTER — Emergency Department (HOSPITAL_COMMUNITY): Payer: Medicare Other

## 2015-01-15 ENCOUNTER — Encounter (HOSPITAL_COMMUNITY): Payer: Self-pay | Admitting: Emergency Medicine

## 2015-01-15 ENCOUNTER — Emergency Department (HOSPITAL_COMMUNITY)
Admission: EM | Admit: 2015-01-15 | Discharge: 2015-01-15 | Disposition: A | Discharge status: Home / Self Care | Payer: Medicare Other | Attending: Emergency Medicine | Admitting: Emergency Medicine

## 2015-01-15 DIAGNOSIS — Z79899 Other long term (current) drug therapy: Secondary | ICD-10-CM | POA: Insufficient documentation

## 2015-01-15 DIAGNOSIS — Z9851 Tubal ligation status: Secondary | ICD-10-CM | POA: Insufficient documentation

## 2015-01-15 DIAGNOSIS — K219 Gastro-esophageal reflux disease without esophagitis: Secondary | ICD-10-CM | POA: Diagnosis not present

## 2015-01-15 DIAGNOSIS — Q394 Esophageal web: Secondary | ICD-10-CM | POA: Insufficient documentation

## 2015-01-15 DIAGNOSIS — Z8669 Personal history of other diseases of the nervous system and sense organs: Secondary | ICD-10-CM | POA: Diagnosis not present

## 2015-01-15 DIAGNOSIS — F419 Anxiety disorder, unspecified: Secondary | ICD-10-CM | POA: Insufficient documentation

## 2015-01-15 DIAGNOSIS — Z9889 Other specified postprocedural states: Secondary | ICD-10-CM | POA: Insufficient documentation

## 2015-01-15 DIAGNOSIS — G8929 Other chronic pain: Secondary | ICD-10-CM | POA: Diagnosis not present

## 2015-01-15 DIAGNOSIS — E785 Hyperlipidemia, unspecified: Secondary | ICD-10-CM | POA: Diagnosis not present

## 2015-01-15 DIAGNOSIS — K529 Noninfective gastroenteritis and colitis, unspecified: Secondary | ICD-10-CM | POA: Insufficient documentation

## 2015-01-15 DIAGNOSIS — Z880 Allergy status to penicillin: Secondary | ICD-10-CM | POA: Insufficient documentation

## 2015-01-15 DIAGNOSIS — M1990 Unspecified osteoarthritis, unspecified site: Secondary | ICD-10-CM | POA: Diagnosis not present

## 2015-01-15 DIAGNOSIS — K5730 Diverticulosis of large intestine without perforation or abscess without bleeding: Secondary | ICD-10-CM | POA: Diagnosis not present

## 2015-01-15 DIAGNOSIS — R1084 Generalized abdominal pain: Secondary | ICD-10-CM | POA: Diagnosis present

## 2015-01-15 DIAGNOSIS — Z7982 Long term (current) use of aspirin: Secondary | ICD-10-CM | POA: Diagnosis not present

## 2015-01-15 DIAGNOSIS — R52 Pain, unspecified: Secondary | ICD-10-CM

## 2015-01-15 DIAGNOSIS — Z86018 Personal history of other benign neoplasm: Secondary | ICD-10-CM | POA: Diagnosis not present

## 2015-01-15 DIAGNOSIS — R197 Diarrhea, unspecified: Secondary | ICD-10-CM | POA: Diagnosis not present

## 2015-01-15 DIAGNOSIS — K449 Diaphragmatic hernia without obstruction or gangrene: Secondary | ICD-10-CM | POA: Diagnosis not present

## 2015-01-15 LAB — CBC WITH DIFFERENTIAL/PLATELET
BASOS ABS: 0 10*3/uL (ref 0.0–0.1)
BASOS PCT: 1 % (ref 0–1)
Eosinophils Absolute: 0 10*3/uL (ref 0.0–0.7)
Eosinophils Relative: 1 % (ref 0–5)
HCT: 33.9 % — ABNORMAL LOW (ref 36.0–46.0)
Hemoglobin: 10.9 g/dL — ABNORMAL LOW (ref 12.0–15.0)
Lymphocytes Relative: 49 % — ABNORMAL HIGH (ref 12–46)
Lymphs Abs: 2.5 10*3/uL (ref 0.7–4.0)
MCH: 31 pg (ref 26.0–34.0)
MCHC: 32.2 g/dL (ref 30.0–36.0)
MCV: 96.3 fL (ref 78.0–100.0)
MONO ABS: 0.5 10*3/uL (ref 0.1–1.0)
Monocytes Relative: 9 % (ref 3–12)
Neutro Abs: 2.1 10*3/uL (ref 1.7–7.7)
Neutrophils Relative %: 40 % — ABNORMAL LOW (ref 43–77)
PLATELETS: 321 10*3/uL (ref 150–400)
RBC: 3.52 MIL/uL — AB (ref 3.87–5.11)
RDW: 14.6 % (ref 11.5–15.5)
WBC: 5.2 10*3/uL (ref 4.0–10.5)

## 2015-01-15 LAB — COMPREHENSIVE METABOLIC PANEL
ALT: 15 U/L (ref 0–35)
ANION GAP: 7 (ref 5–15)
AST: 25 U/L (ref 0–37)
Albumin: 4.1 g/dL (ref 3.5–5.2)
Alkaline Phosphatase: 53 U/L (ref 39–117)
BUN: 11 mg/dL (ref 6–23)
CHLORIDE: 104 mmol/L (ref 96–112)
CO2: 28 mmol/L (ref 19–32)
Calcium: 9.8 mg/dL (ref 8.4–10.5)
Creatinine, Ser: 0.62 mg/dL (ref 0.50–1.10)
GFR calc non Af Amer: 80 mL/min — ABNORMAL LOW (ref >90)
Glucose, Bld: 113 mg/dL — ABNORMAL HIGH (ref 70–99)
POTASSIUM: 3.7 mmol/L (ref 3.5–5.1)
Sodium: 139 mmol/L (ref 135–145)
Total Bilirubin: 0.6 mg/dL (ref 0.3–1.2)
Total Protein: 7.3 g/dL (ref 6.0–8.3)

## 2015-01-15 LAB — URINALYSIS, ROUTINE W REFLEX MICROSCOPIC
Bilirubin Urine: NEGATIVE
Glucose, UA: NEGATIVE mg/dL
Ketones, ur: NEGATIVE mg/dL
NITRITE: NEGATIVE
PH: 6 (ref 5.0–8.0)
PROTEIN: NEGATIVE mg/dL
Specific Gravity, Urine: 1.01 (ref 1.005–1.030)
Urobilinogen, UA: 0.2 mg/dL (ref 0.0–1.0)

## 2015-01-15 LAB — LIPASE, BLOOD: Lipase: 38 U/L (ref 11–59)

## 2015-01-15 LAB — URINE MICROSCOPIC-ADD ON

## 2015-01-15 MED ORDER — IOHEXOL 300 MG/ML  SOLN
50.0000 mL | Freq: Once | INTRAMUSCULAR | Status: AC | PRN
  Administered 2015-01-15: 50 mL via ORAL

## 2015-01-15 MED ORDER — IOHEXOL 300 MG/ML  SOLN
100.0000 mL | Freq: Once | INTRAMUSCULAR | Status: AC | PRN
  Administered 2015-01-15: 100 mL via INTRAVENOUS

## 2015-01-15 MED ORDER — SODIUM CHLORIDE 0.9 % IV BOLUS (SEPSIS)
500.0000 mL | Freq: Once | INTRAVENOUS | Status: AC
  Administered 2015-01-15: 500 mL via INTRAVENOUS

## 2015-01-15 MED ORDER — DICYCLOMINE HCL 20 MG PO TABS: ORAL_TABLET | ORAL | Status: DC

## 2015-01-15 NOTE — ED Notes (Signed)
Patient with no complaints at this time. Respirations even and unlabored. Skin warm/dry. Discharge instructions reviewed with patient at this time. Patient given opportunity to voice concerns/ask questions. IV removed per policy and band-aid applied to site. Patient discharged at this time and left Emergency Department with steady gait.  

## 2015-01-15 NOTE — Discharge Instructions (Signed)
Increase your prilosec to twice  A day and follow up with your md next week.
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
## 2015-01-15 NOTE — ED Notes (Signed)
Pt reports abdominal pain and diarrhea since Tues. Pt c/o sweet taste in her mouth.

## 2015-01-26 ENCOUNTER — Encounter: Payer: Self-pay | Admitting: Internal Medicine

## 2015-01-26 ENCOUNTER — Encounter: Payer: Self-pay | Admitting: Family Medicine

## 2015-01-26 ENCOUNTER — Ambulatory Visit (INDEPENDENT_AMBULATORY_CARE_PROVIDER_SITE_OTHER): Payer: Medicare Other | Admitting: Family Medicine

## 2015-01-26 VITALS — BP 130/60 | HR 66 | Resp 18 | Ht 62.0 in | Wt 112.0 lb

## 2015-01-26 DIAGNOSIS — R319 Hematuria, unspecified: Secondary | ICD-10-CM | POA: Insufficient documentation

## 2015-01-26 DIAGNOSIS — D649 Anemia, unspecified: Secondary | ICD-10-CM | POA: Diagnosis not present

## 2015-01-26 DIAGNOSIS — R634 Abnormal weight loss: Secondary | ICD-10-CM

## 2015-01-26 DIAGNOSIS — K219 Gastro-esophageal reflux disease without esophagitis: Secondary | ICD-10-CM

## 2015-01-26 DIAGNOSIS — D126 Benign neoplasm of colon, unspecified: Secondary | ICD-10-CM | POA: Diagnosis not present

## 2015-01-26 DIAGNOSIS — Z23 Encounter for immunization: Secondary | ICD-10-CM

## 2015-01-26 DIAGNOSIS — J302 Other seasonal allergic rhinitis: Secondary | ICD-10-CM

## 2015-01-26 DIAGNOSIS — M810 Age-related osteoporosis without current pathological fracture: Secondary | ICD-10-CM

## 2015-01-26 DIAGNOSIS — E785 Hyperlipidemia, unspecified: Secondary | ICD-10-CM

## 2015-01-26 DIAGNOSIS — F411 Generalized anxiety disorder: Secondary | ICD-10-CM

## 2015-01-26 MED ORDER — TRIAMCINOLONE ACETONIDE 55 MCG/ACT NA AERO: 2.0000 | INHALATION_SPRAY | Freq: Every day | NASAL | Status: DC

## 2015-01-26 NOTE — Patient Instructions (Addendum)
F/u in 3 month, call if you need me before  You are referred to Dr Gala Romney due to anemia and weight loss with poor appetite, also stomach pain  You are referred to Dr Blair Dolphin re blood in the urine   Please commit to eating more regularly , even small amounts of foods  Which are rich in nutrients and protein  I suggest that you start getting meals on wheels, to help with your nutrition  Prevnar today  I hope that you start feeling stronger soon  Thanks for choosing [facility], we consider it a privelige to serve you.

## 2015-01-28 ENCOUNTER — Telehealth: Payer: Self-pay | Admitting: Internal Medicine

## 2015-01-28 NOTE — Telephone Encounter (Signed)
Pt has OV with [place] on 4/27 and today she calls to say that the acid reflux is making her sick and she can't wait that long to be seen. I told her that we do not have anything any sooner and I could call her if we have any cancellations. She wants to know what is she suppose to do in the meantime.  Please advise and call [phone]

## 2015-01-28 NOTE — Telephone Encounter (Signed)
I called patient back because we had a cancellation for Monday at 11 and she agreed to come, but she is still wanting to speak with the nurse to see what can hold her through the weekend. First time I spoke with her it was for acid reflux and when I called to move up her OV she now tells me that her bowels aren't moving. Please advise and call her. I told her we are here today until noon.

## 2015-01-31 ENCOUNTER — Ambulatory Visit: Payer: Medicare Other | Admitting: Nurse Practitioner

## 2015-01-31 NOTE — Telephone Encounter (Signed)
I called patient to offer her OV with AS on Thursday this week and patient agreed.

## 2015-01-31 NOTE — Telephone Encounter (Signed)
I called patient this morning to let her know that the provider (EG) she was going to see today is out sick and that I needed to put her back on 4/27. She would like to be seen sooner, but I told her that there was nothing any sooner and with EG being out sick there are a lot of patients having to be rescheduled and if I had anything any sooner I would let her know. She would like to speak with the nurse. Please call patient.

## 2015-02-03 ENCOUNTER — Telehealth: Payer: Self-pay | Admitting: Gastroenterology

## 2015-02-03 ENCOUNTER — Encounter: Payer: Self-pay | Admitting: Gastroenterology

## 2015-02-03 ENCOUNTER — Ambulatory Visit (INDEPENDENT_AMBULATORY_CARE_PROVIDER_SITE_OTHER): Payer: Medicare Other | Admitting: Gastroenterology

## 2015-02-03 ENCOUNTER — Other Ambulatory Visit: Payer: Self-pay | Admitting: Family Medicine

## 2015-02-03 ENCOUNTER — Other Ambulatory Visit: Payer: Self-pay

## 2015-02-03 VITALS — BP 157/66 | HR 72 | Temp 97.0°F | Ht 61.0 in | Wt 111.2 lb

## 2015-02-03 DIAGNOSIS — K5900 Constipation, unspecified: Secondary | ICD-10-CM

## 2015-02-03 DIAGNOSIS — R1013 Epigastric pain: Principal | ICD-10-CM

## 2015-02-03 DIAGNOSIS — K30 Functional dyspepsia: Secondary | ICD-10-CM

## 2015-02-03 DIAGNOSIS — R1312 Dysphagia, oropharyngeal phase: Secondary | ICD-10-CM

## 2015-02-03 DIAGNOSIS — D649 Anemia, unspecified: Secondary | ICD-10-CM

## 2015-02-03 DIAGNOSIS — K319 Disease of stomach and duodenum, unspecified: Secondary | ICD-10-CM

## 2015-02-03 DIAGNOSIS — R1314 Dysphagia, pharyngoesophageal phase: Secondary | ICD-10-CM

## 2015-02-03 HISTORY — DX: Dysphagia, oropharyngeal phase: R13.12

## 2015-02-03 MED ORDER — OMEPRAZOLE 20 MG PO CPDR: 20.0000 mg | DELAYED_RELEASE_CAPSULE | Freq: Two times a day (BID) | ORAL | Status: DC

## 2015-02-03 NOTE — Telephone Encounter (Signed)
I completely forgot to order an iron, ferritin, and TIBC. Can we have her do this? If there is any evidence of IDA, she will need a colonoscopy. EGD already planned.
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
## 2015-02-03 NOTE — Telephone Encounter (Signed)
Routing to AS for FYI

## 2015-02-03 NOTE — Telephone Encounter (Signed)
Pt is aware. Lab orders done and sent to the lab. Advised pt to have these done soon as we may need to add a tcs to her procedure. Pt verbalized understanding.

## 2015-02-03 NOTE — Patient Instructions (Signed)
We have scheduled you for an upper endoscopy with possible dilation with Dr. Gala Romney.  Increase Prilosec to twice a day, 30 minutes before breakfast and dinner. I have sent in a new prescription.  Start taking Miralax each evening for constipation.

## 2015-02-07 ENCOUNTER — Other Ambulatory Visit: Payer: Self-pay

## 2015-02-07 ENCOUNTER — Encounter: Payer: Self-pay | Admitting: Gastroenterology

## 2015-02-07 DIAGNOSIS — D649 Anemia, unspecified: Secondary | ICD-10-CM | POA: Diagnosis not present

## 2015-02-07 NOTE — Assessment & Plan Note (Signed)
Slightly drifting Hgb without overt signs of GI bleeding. Unknown recent iron and ferritin. Check iron studies now. If evidence of IDA, will need colonoscopy.

## 2015-02-07 NOTE — Assessment & Plan Note (Signed)
Start Miralax once daily as needed. Declining Linzess or Amitiza. No rectal bleeding. Last colonoscopy in 2012 with need for surveillance in 2017 due to history of adenomas. May need early interval colonoscopy at time of EGD if evidence of IDA.

## 2015-02-07 NOTE — Assessment & Plan Note (Signed)
states vague difficulty swallowing saliva at times but no solid food dysphagia. May have underlying motility disorder, unable to exclude oropharyngeal component. Will add possible dilation if necessary.

## 2015-02-07 NOTE — Progress Notes (Signed)
cc'ed to pcp °

## 2015-02-07 NOTE — Assessment & Plan Note (Signed)
81 year old female with worsening epigastric discomfort, GERD symptoms, and associated weight loss for the past month despite Prilosec daily. Last EGD in 2013 with Schatzki's ring not manipulated and chronic duodenitis. Gallbladder remains in situ, with ultrasound abdomen in Sept 2015 without gallstones. Fatty liver. LFTs normal. Recommend EGD for further assessment. May need HIDA scan. Will review CT with radiologist to evaluate mesenteric vasculature.   As of note, states vague difficulty swallowing saliva at times but no solid food dysphagia. May have underlying motility disorder, unable to exclude oropharyngeal component. Will add possible dilation if necessary.   Proceed with upper endoscopy +/- dilation in the near future with Dr. Gala Romney. The risks, benefits, and alternatives have been discussed in detail with patient. They have stated understanding and desire to proceed.  Increase Prilosec to BID

## 2015-02-08 LAB — IRON AND TIBC
%SAT: 23 % (ref 20–55)
IRON: 71 ug/dL (ref 42–145)
TIBC: 307 ug/dL (ref 250–470)
UIBC: 236 ug/dL (ref 125–400)

## 2015-02-08 LAB — FERRITIN: Ferritin: 22 ng/mL (ref 10–291)

## 2015-02-10 NOTE — Progress Notes (Signed)
Quick Note:  Ferritin and iron both have decreased over the past year. Ferritin low normal at 22. She may have an evolving IDA. I think it is in her best interest to pursue a colonoscopy at time of EGD. Can we add this on for the 26th? ______

## 2015-02-11 ENCOUNTER — Other Ambulatory Visit: Payer: Self-pay

## 2015-02-11 ENCOUNTER — Ambulatory Visit (INDEPENDENT_AMBULATORY_CARE_PROVIDER_SITE_OTHER): Payer: Medicare Other

## 2015-02-11 VITALS — Wt 112.8 lb

## 2015-02-11 DIAGNOSIS — N3001 Acute cystitis with hematuria: Secondary | ICD-10-CM | POA: Diagnosis not present

## 2015-02-11 DIAGNOSIS — N3000 Acute cystitis without hematuria: Secondary | ICD-10-CM | POA: Diagnosis not present

## 2015-02-11 LAB — POCT URINALYSIS DIPSTICK
Bilirubin, UA: NEGATIVE
Glucose, UA: NEGATIVE
Ketones, UA: NEGATIVE
NITRITE UA: POSITIVE
PH UA: 5.5
Spec Grav, UA: 1.02
UROBILINOGEN UA: 0.2

## 2015-02-11 MED ORDER — CIPROFLOXACIN HCL 500 MG PO TABS: 500.0000 mg | ORAL_TABLET | Freq: Two times a day (BID) | ORAL | Status: DC

## 2015-02-11 MED ORDER — PEG 3350-KCL-NA BICARB-NACL 420 G PO SOLR: 4000.0000 mL | Freq: Once | ORAL | Status: DC

## 2015-02-11 NOTE — Progress Notes (Signed)
cipro 500 bid given for 3 days. Will send for culture and will follow up

## 2015-02-14 ENCOUNTER — Other Ambulatory Visit: Payer: Self-pay

## 2015-02-14 ENCOUNTER — Encounter: Payer: Medicare Other | Admitting: Family Medicine

## 2015-02-14 DIAGNOSIS — R1314 Dysphagia, pharyngoesophageal phase: Secondary | ICD-10-CM

## 2015-02-14 DIAGNOSIS — R1013 Epigastric pain: Secondary | ICD-10-CM

## 2015-02-14 DIAGNOSIS — D649 Anemia, unspecified: Secondary | ICD-10-CM

## 2015-02-14 DIAGNOSIS — K5900 Constipation, unspecified: Secondary | ICD-10-CM

## 2015-02-15 ENCOUNTER — Ambulatory Visit (HOSPITAL_COMMUNITY): Admission: RE | Admit: 2015-02-15 | Payer: Medicare Other | Source: Ambulatory Visit | Admitting: Internal Medicine

## 2015-02-15 ENCOUNTER — Encounter (HOSPITAL_COMMUNITY): Admission: RE | Payer: Self-pay | Source: Ambulatory Visit

## 2015-02-15 SURGERY — EGD (ESOPHAGOGASTRODUODENOSCOPY)
Anesthesia: Moderate Sedation

## 2015-02-16 ENCOUNTER — Ambulatory Visit: Payer: Medicare Other | Admitting: Gastroenterology

## 2015-02-16 ENCOUNTER — Ambulatory Visit: Payer: Medicare Other | Admitting: Nurse Practitioner

## 2015-02-16 NOTE — Progress Notes (Signed)
Lab Results  Component Value Date   IRON 71 02/07/2015   TIBC 307 02/07/2015   FERRITIN 22 02/07/2015   Ferritin and iron both have decreased over the past year. Ferritin low normal at 22. She may have an evolving IDA.Proceed with TCS/EGD/dilation with Dr. Gala Romney.

## 2015-02-17 LAB — URINE CULTURE

## 2015-02-21 ENCOUNTER — Telehealth: Payer: Self-pay

## 2015-02-21 NOTE — Telephone Encounter (Signed)
Pt is calling because she is not wanting to have the TCS done right now. She would like to do the stool sample to see if she has blood in her stools. She also would like to move up her EGD is possible.She has stomach trouble and just done not think she can drank all the stuff to clean her out.  Please advise

## 2015-02-22 NOTE — Telephone Encounter (Signed)
I understand she does not want to do the colonoscopy. It is up to her. Would definitely proceed with EGD+/- dil. I need to review CT with radiologist. May cancel colonoscopy for now.

## 2015-02-24 ENCOUNTER — Other Ambulatory Visit: Payer: Self-pay

## 2015-02-24 DIAGNOSIS — R634 Abnormal weight loss: Secondary | ICD-10-CM

## 2015-02-24 DIAGNOSIS — R109 Unspecified abdominal pain: Secondary | ICD-10-CM

## 2015-02-24 NOTE — Progress Notes (Signed)
I reviewed the CT with Dr. Thornton Papas.   I think we need to do a CT angiogram as soon as possible for her. She has significant atherosclerotic calcifications, plaque at origin of celiac and SMA which may be around 50%. At IMA, the origin appears patent but there is plaque noted as well.   She may ultimately need an EGD with dilation, but if we can get the CT angiogram done FIRST, this would be best. Keep her on for the EGD, but let's get a CTA done in the next few days. Need to rule out chronic mesenteric ischemia. This would line up with her weight loss symptoms and abdominal pain. NO COLONOSCOPY FOR NOW.

## 2015-02-24 NOTE — Telephone Encounter (Signed)
Not precert is needed.

## 2015-02-24 NOTE — Telephone Encounter (Signed)
I reviewed the CT with Dr. Thornton Papas.   I think we need to do a CT angiogram as soon as possible for her. She has significant atherosclerotic calcifications, plaque at origin of celiac and SMA which may be around 50%. At IMA, the origin appears patent but there is plaque noted as well.   She may ultimately need an EGD with dilation, but if we can get the CT angiogram done FIRST, this would be best. Keep her on for the EGD, but let's get a CTA done in the next few days. Need to rule out chronic mesenteric ischemia. This would line up with her weight loss symptoms and abdominal pain.

## 2015-02-24 NOTE — Telephone Encounter (Signed)
Pt is aware.  

## 2015-02-24 NOTE — Telephone Encounter (Signed)
Spoke with pt and she is aware of new appointment for CT angiogram.  Pt is aware of appointment on 02/28/2015 @ 1:00pm and she is aware that she needs to be fasting at least 2 hours prior to exam.

## 2015-02-28 ENCOUNTER — Ambulatory Visit (HOSPITAL_COMMUNITY)
Admission: RE | Admit: 2015-02-28 | Discharge: 2015-02-28 | Disposition: A | Discharge status: Home / Self Care | Payer: Medicare Other | Source: Ambulatory Visit | Attending: Gastroenterology | Admitting: Gastroenterology

## 2015-02-28 ENCOUNTER — Other Ambulatory Visit: Payer: Self-pay | Admitting: Gastroenterology

## 2015-02-28 DIAGNOSIS — R634 Abnormal weight loss: Secondary | ICD-10-CM

## 2015-02-28 DIAGNOSIS — K5900 Constipation, unspecified: Secondary | ICD-10-CM | POA: Insufficient documentation

## 2015-02-28 DIAGNOSIS — I700 Atherosclerosis of aorta: Secondary | ICD-10-CM | POA: Diagnosis not present

## 2015-02-28 DIAGNOSIS — R109 Unspecified abdominal pain: Secondary | ICD-10-CM

## 2015-02-28 DIAGNOSIS — K5730 Diverticulosis of large intestine without perforation or abscess without bleeding: Secondary | ICD-10-CM | POA: Diagnosis not present

## 2015-02-28 LAB — POCT I-STAT CREATININE: Creatinine, Ser: 0.7 mg/dL (ref 0.44–1.00)

## 2015-02-28 MED ORDER — IOHEXOL 350 MG/ML SOLN
100.0000 mL | Freq: Once | INTRAVENOUS | Status: AC | PRN
  Administered 2015-02-28: 100 mL via INTRAVENOUS

## 2015-03-01 ENCOUNTER — Encounter: Payer: Self-pay | Admitting: Gastroenterology

## 2015-03-01 NOTE — Progress Notes (Signed)
CTA negative for mesenteric ischemia.

## 2015-03-01 NOTE — Progress Notes (Signed)
Quick Note:  CTA without evidence of mesenteric ischemia. Continue with EGD as planned. ______

## 2015-03-04 ENCOUNTER — Encounter (HOSPITAL_COMMUNITY)
Admission: RE | Disposition: A | Discharge status: Home / Self Care | Payer: Self-pay | Source: Ambulatory Visit | Attending: Internal Medicine

## 2015-03-04 ENCOUNTER — Ambulatory Visit (HOSPITAL_COMMUNITY)
Admission: RE | Admit: 2015-03-04 | Discharge: 2015-03-04 | Disposition: A | Discharge status: Home / Self Care | Payer: Medicare Other | Source: Ambulatory Visit | Attending: Family Medicine | Admitting: Family Medicine

## 2015-03-04 ENCOUNTER — Encounter: Payer: Self-pay | Admitting: Internal Medicine

## 2015-03-04 ENCOUNTER — Ambulatory Visit (HOSPITAL_COMMUNITY)
Admission: RE | Admit: 2015-03-04 | Discharge: 2015-03-04 | Disposition: A | Discharge status: Home / Self Care | Payer: Medicare Other | Source: Ambulatory Visit | Attending: Internal Medicine | Admitting: Internal Medicine

## 2015-03-04 ENCOUNTER — Telehealth: Payer: Self-pay

## 2015-03-04 ENCOUNTER — Encounter (HOSPITAL_COMMUNITY): Payer: Self-pay

## 2015-03-04 ENCOUNTER — Ambulatory Visit (HOSPITAL_COMMUNITY): Payer: Medicare Other

## 2015-03-04 DIAGNOSIS — K222 Esophageal obstruction: Secondary | ICD-10-CM | POA: Insufficient documentation

## 2015-03-04 DIAGNOSIS — R6884 Jaw pain: Secondary | ICD-10-CM | POA: Diagnosis not present

## 2015-03-04 DIAGNOSIS — R1013 Epigastric pain: Secondary | ICD-10-CM

## 2015-03-04 DIAGNOSIS — M5440 Lumbago with sciatica, unspecified side: Secondary | ICD-10-CM

## 2015-03-04 DIAGNOSIS — R1314 Dysphagia, pharyngoesophageal phase: Secondary | ICD-10-CM

## 2015-03-04 DIAGNOSIS — R1310 Dysphagia, unspecified: Secondary | ICD-10-CM | POA: Diagnosis not present

## 2015-03-04 DIAGNOSIS — K449 Diaphragmatic hernia without obstruction or gangrene: Secondary | ICD-10-CM | POA: Insufficient documentation

## 2015-03-04 HISTORY — PX: ESOPHAGEAL DILATION: SHX303

## 2015-03-04 HISTORY — PX: ESOPHAGOGASTRODUODENOSCOPY: SHX5428

## 2015-03-04 SURGERY — EGD (ESOPHAGOGASTRODUODENOSCOPY)
Anesthesia: Moderate Sedation

## 2015-03-04 MED ORDER — MIDAZOLAM HCL 5 MG/5ML IJ SOLN
INTRAMUSCULAR | Status: AC
  Filled 2015-03-04: qty 10

## 2015-03-04 MED ORDER — MEPERIDINE HCL 100 MG/ML IJ SOLN
INTRAMUSCULAR | Status: AC
  Filled 2015-03-04: qty 2

## 2015-03-04 MED ORDER — ONDANSETRON HCL 4 MG/2ML IJ SOLN
INTRAMUSCULAR | Status: AC
  Filled 2015-03-04: qty 2

## 2015-03-04 MED ORDER — LIDOCAINE VISCOUS 2 % MT SOLN
OROMUCOSAL | Status: DC | PRN
  Administered 2015-03-04: 3 mL via OROMUCOSAL

## 2015-03-04 MED ORDER — SODIUM CHLORIDE 0.9 % IV SOLN
INTRAVENOUS | Status: DC
  Administered 2015-03-04: 09:00:00 via INTRAVENOUS

## 2015-03-04 MED ORDER — LIDOCAINE VISCOUS 2 % MT SOLN
OROMUCOSAL | Status: AC
  Filled 2015-03-04: qty 15

## 2015-03-04 MED ORDER — IOHEXOL 300 MG/ML  SOLN
150.0000 mL | Freq: Once | INTRAMUSCULAR | Status: AC | PRN
  Administered 2015-03-04: 75 mL via ORAL

## 2015-03-04 MED ORDER — MIDAZOLAM HCL 5 MG/5ML IJ SOLN
INTRAMUSCULAR | Status: DC | PRN
  Administered 2015-03-04 (×2): 1 mg via INTRAVENOUS
  Administered 2015-03-04: 2 mg via INTRAVENOUS

## 2015-03-04 MED ORDER — STERILE WATER FOR IRRIGATION IR SOLN
Status: DC | PRN
  Administered 2015-03-04: 10:00:00

## 2015-03-04 MED ORDER — MEPERIDINE HCL 100 MG/ML IJ SOLN
INTRAMUSCULAR | Status: DC | PRN
  Administered 2015-03-04: 25 mg via INTRAVENOUS

## 2015-03-04 MED ORDER — ONDANSETRON HCL 4 MG/2ML IJ SOLN
INTRAMUSCULAR | Status: DC | PRN
  Administered 2015-03-04: 4 mg via INTRAVENOUS

## 2015-03-04 NOTE — Telephone Encounter (Signed)
Noted. She is back up at short stay. I am evaluating.

## 2015-03-04 NOTE — Progress Notes (Signed)
Patient resting in recliner while waiting for Barium Esophagram.  Daughter Joeseph Amor with patient.

## 2015-03-04 NOTE — Interval H&P Note (Signed)
History and Physical Interval Note:  05/08/5500 5:86 AM  Shakenna A Kumagai  has presented today for surgery, with the diagnosis of dyspepsia, dysphagia, anemia  The various methods of treatment have been discussed with the patient and family. After consideration of risks, benefits and other options for treatment, the patient has consented to  Procedure(s): ESOPHAGOGASTRODUODENOSCOPY (EGD) (N/A) ESOPHAGEAL DILATION (N/A) as a surgical intervention .  The patient's history has been reviewed, patient examined, no change in status, stable for surgery.  I have reviewed the patient's chart and labs.  Questions were answered to the patient's satisfaction.     Kimmi Acocella  CTA negative for ischemia. No change. EGD with esophageal dilation as appropriate. The risks, benefits, limitations, alternatives and imponderables have been reviewed with the patient. Potential for esophageal dilation, biopsy, etc. have also been reviewed.  Questions have been answered. All parties agreeable.

## 2015-03-04 NOTE — Progress Notes (Signed)
Patient transported to Radiology via wheelchair.  Radiology to page Dr. Gala Romney when complete for result review.

## 2015-03-04 NOTE — Discharge Instructions (Signed)
EGD Discharge instructions Please read the instructions outlined below and refer to this sheet in the next few weeks. These discharge instructions provide you with general information on caring for yourself after you leave the hospital. Your doctor may also give you specific instructions. While your treatment has been planned according to the most current medical practices available, unavoidable complications occasionally occur. If you have any problems or questions after discharge, please call your doctor. ACTIVITY  You may resume your regular activity but move at a slower pace for the next 24 hours.   Take frequent rest periods for the next 24 hours.   Walking will help expel (get rid of) the air and reduce the bloated feeling in your abdomen.   No driving for 24 hours (because of the anesthesia (medicine) used during the test).   You may shower.   Do not sign any important legal documents or operate any machinery for 24 hours (because of the anesthesia used during the test).  NUTRITION  Drink plenty of fluids.   You may resume your normal diet.   Begin with a light meal and progress to your normal diet.   Avoid alcoholic beverages for 24 hours or as instructed by your caregiver.  MEDICATIONS  You may resume your normal medications unless your caregiver tells you otherwise.  WHAT YOU CAN EXPECT TODAY  You may experience abdominal discomfort such as a feeling of fullness or gas pains.  FOLLOW-UP  Your doctor will discuss the results of your test with you.  SEEK IMMEDIATE MEDICAL ATTENTION IF ANY OF THE FOLLOWING OCCUR:  Excessive nausea (feeling sick to your stomach) and/or vomiting.   Severe abdominal pain and distention (swelling).   Trouble swallowing.   Temperature over 101 F (37.8 C).   Rectal bleeding or vomiting of blood.     GERD and hiatal hernia information provided  Stop omeprazole for now; try Protonix 40 mg daily  Office visit with [place] in 6 weeks    FOLLOW UP APPOINTMENT:  June 22ND, 1030AM, Wednesday   Gastroesophageal Reflux Disease, Adult Gastroesophageal reflux disease (GERD) happens when acid from your stomach flows up into the esophagus. When acid comes in contact with the esophagus, the acid causes soreness (inflammation) in the esophagus. Over time, GERD may create small holes (ulcers) in the lining of the esophagus. CAUSES   Increased body weight. This puts pressure on the stomach, making acid rise from the stomach into the esophagus.  Smoking. This increases acid production in the stomach.  Drinking alcohol. This causes decreased pressure in the lower esophageal sphincter (valve or ring of muscle between the esophagus and stomach), allowing acid from the stomach into the esophagus.  Late evening meals and a full stomach. This increases pressure and acid production in the stomach.  A malformed lower esophageal sphincter. Sometimes, no cause is found. SYMPTOMS   Burning pain in the lower part of the mid-chest behind the breastbone and in the mid-stomach area. This may occur twice a week or more often.  Trouble swallowing.  Sore throat.  Dry cough.  Asthma-like symptoms including chest tightness, shortness of breath, or wheezing. DIAGNOSIS  Your caregiver may be able to diagnose GERD based on your symptoms. In some cases, X-rays and other tests may be done to check for complications or to check the condition of your stomach and esophagus. TREATMENT  Your caregiver may recommend over-the-counter or prescription medicines to help decrease acid production. Ask your caregiver before starting or adding any new medicines.  HOME CARE INSTRUCTIONS   Change the factors that you can control. Ask your caregiver for guidance concerning weight loss, quitting smoking, and alcohol consumption.  Avoid foods and drinks that make your symptoms worse, such as:  Caffeine or alcoholic drinks.  Chocolate.  Peppermint or mint  flavorings.  Garlic and onions.  Spicy foods.  Citrus fruits, such as oranges, lemons, or limes.  Tomato-based foods such as sauce, chili, salsa, and pizza.  Fried and fatty foods.  Avoid lying down for the 3 hours prior to your bedtime or prior to taking a nap.  Eat small, frequent meals instead of large meals.  Wear loose-fitting clothing. Do not wear anything tight around your waist that causes pressure on your stomach.  Raise the head of your bed 6 to 8 inches with wood blocks to help you sleep. Extra pillows will not help.  Only take over-the-counter or prescription medicines for pain, discomfort, or fever as directed by your caregiver.  Do not take aspirin, ibuprofen, or other nonsteroidal anti-inflammatory drugs (NSAIDs). SEEK IMMEDIATE MEDICAL CARE IF:   You have pain in your arms, neck, jaw, teeth, or back.  Your pain increases or changes in intensity or duration.  You develop nausea, vomiting, or sweating (diaphoresis).  You develop shortness of breath, or you faint.  Your vomit is green, yellow, black, or looks like coffee grounds or blood.  Your stool is red, bloody, or black. These symptoms could be signs of other problems, such as heart disease, gastric bleeding, or esophageal bleeding. MAKE SURE YOU:   Understand these instructions.  Will watch your condition.  Will get help right away if you are not doing well or get worse.    Hiatal Hernia A hiatal hernia occurs when part of your stomach slides above the muscle that separates your abdomen from your chest (diaphragm). You can be born with a hiatal hernia (congenital), or it may develop over time. In almost all cases of hiatal hernia, only the top part of the stomach pushes through.  Many people have a hiatal hernia with no symptoms. The larger the hernia, the more likely that you will have symptoms. In some cases, a hiatal hernia allows stomach acid to flow back into the tube that carries food from  your mouth to your stomach (esophagus). This may cause heartburn symptoms. Severe heartburn symptoms may mean you have developed a condition called gastroesophageal reflux disease (GERD).  CAUSES  Hiatal hernias are caused by a weakness in the opening (hiatus) where your esophagus passes through your diaphragm to attach to the upper part of your stomach. You may be born with a weakness in your hiatus, or a weakness can develop. RISK FACTORS Older age is a major risk factor for a hiatal hernia. Anything that increases pressure on your diaphragm can also increase your risk of a hiatal hernia. This includes: Pregnancy. Excess weight. Frequent constipation. SIGNS AND SYMPTOMS  People with a hiatal hernia often have no symptoms. If symptoms develop, they are almost always caused by GERD. They may include: Heartburn. Belching. Indigestion. Trouble swallowing. Coughing or wheezing. Sore throat. Hoarseness. Chest pain. DIAGNOSIS  A hiatal hernia is sometimes found during an exam for another problem. Your health care provider may suspect a hiatal hernia if you have symptoms of GERD. Tests may be done to diagnose GERD. These may include: X-rays of your stomach or chest. An upper gastrointestinal (GI) series. This is an X-ray exam of your GI tract involving the use of a chalky  liquid that you swallow. The liquid shows up clearly on the X-ray. Endoscopy. This is a procedure to look into your stomach using a thin, flexible tube that has a tiny camera and light on the end of it. TREATMENT  If you have no symptoms, you may not need treatment. If you have symptoms, treatment may include: Dietary and lifestyle changes to help reduce GERD symptoms. Medicines. These may include: Over-the-counter antacids. Medicines that make your stomach empty more quickly. Medicines that block the production of stomach acid (H2 blockers). Stronger medicines to reduce stomach acid (proton pump inhibitors). You may need  surgery to repair the hernia if other treatments are not helping. HOME CARE INSTRUCTIONS  Take all medicines as directed by your health care provider. Quit smoking, if you smoke. Try to achieve and maintain a healthy body weight. Eat frequent small meals instead of three large meals a day. This keeps your stomach from getting too full. Eat slowly. Do not lie down right after eating. Do noteat 1-2 hours before bed.  Do not drink beverages with caffeine. These include cola, coffee, cocoa, and tea. Do not drink alcohol. Avoid foods that can make symptoms of GERD worse. These may include: Fatty foods. Citrus fruits. Other foods and drinks that contain acid. Avoid putting pressure on your belly. Anything that puts pressure on your belly increases the amount of acid that may be pushed up into your esophagus.  Avoid bending over, especially after eating. Raise the head of your bed by putting blocks under the legs. This keeps your head and esophagus higher than your stomach. Do not wear tight clothing around your chest or stomach. Try not to strain when having a bowel movement, when urinating, or when lifting heavy objects. SEEK MEDICAL CARE IF: Your symptoms are not controlled with medicines or lifestyle changes. You are having trouble swallowing. You have coughing or wheezing that will not go away. SEEK IMMEDIATE MEDICAL CARE IF: Your pain is getting worse. Your pain spreads to your arms, neck, jaw, teeth, or back. You have shortness of breath. You sweat for no reason. You feel sick to your stomach (nauseous) or vomit. You vomit blood. You have bright red blood in your stools. You have black, tarry stools.  Document Released: 12/29/2003 Document Revised: 02/22/2014 Document Reviewed: 09/25/2013 [facility] Patient Information 2015 [place], [place]. This information is not intended to replace advice given to you by your health care provider. Make sure you discuss any questions you have  with your health care provider.   Per Dr. Gala Romney,  Recommend light liquid diet today and next 12-24 hours. Tylenol Extra Strength as needed for pain.  Chloraseptic losenges over the counter.  Rest Jaw.   Jaw strain likely, no perforation per Dr. Gala Romney.Marland Kitchen

## 2015-03-04 NOTE — H&P (View-Only) (Signed)
Lab Results  Component Value Date   IRON 71 02/07/2015   TIBC 307 02/07/2015   FERRITIN 22 02/07/2015   Ferritin and iron both have decreased over the past year. Ferritin low normal at 22. She may have an evolving IDA.Proceed with TCS/EGD/dilation with Dr. Gala Romney.

## 2015-03-04 NOTE — Progress Notes (Signed)
Patient returned to short stay c/o jaw pain with swallowing after Esophagus.  Dr. Gala Romney notified, in to evaluate patient.

## 2015-03-04 NOTE — Telephone Encounter (Signed)
Pateints  daughter [name]) called and states that they just left [facility] Short Stay and that the pt is having jaw pain. Advised them to go back to endo or to the ER for evaluation

## 2015-03-04 NOTE — Progress Notes (Unsigned)
Patient ID: Kara [name], female   DOB: 11-09-1926, 81 y.o.   MRN: [id number] Patient returned to endoscopy after developing mandible pain after she left short stay. States she has mandible pain when she swallows. Denies any chest discomfort whatsoever. She is able to open and close her mouth without difficulty. I don't feel anything in her neck,. No crepitus.  She appears in no acute distress. There was little mucosal disruption through the UES after [place] was passed projects (on look back). Will obtain a contrast swallowing study. Discussed at length with patient and patient's daughter.

## 2015-03-04 NOTE — Op Note (Signed)
[facility] [address] [place], [address]   ENDOSCOPY PROCEDURE REPORT  PATIENT: Kara, [name]  MR#: [id number] BIRTHDATE: 1926-12-07 , 77  yrs. old GENDER: female ENDOSCOPIST: R.  Garfield Cornea, MD FACP FACG REFERRED BY:  [name], M.D. PROCEDURE DATE:  2015-03-28 PROCEDURE:  Venia Minks dilation of esophagus INDICATIONS:  Esophageal dysphagia; history of Schatzki's ring. epigastric pain MEDICATIONS: Versed 4 mg IV and Demerol 25 mg IV in divided doses. Xylocaine gel orally.  Zofran 4 mg IV. ASA CLASS:      Class II  CONSENT: The risks, benefits, limitations, alternatives and imponderables have been discussed.  The potential for biopsy, esophogeal dilation, etc. have also been reviewed.  Questions have been answered.  All parties agreeable.  Please see the history and physical in the medical record for more information.  DESCRIPTION OF PROCEDURE: After the risks benefits and alternatives of the procedure were thoroughly explained, informed consent was obtained.  The EG-[id number] ([id number]) endoscope was introduced through the mouth and advanced to the second portion of the duodenum , limited by Without limitations. The instrument was slowly withdrawn as the mucosa was fully examined.    Schatzki's ring present; otherwise, normal-appearing tubular esophagus.  Stomach empty.  3 cm hiatal hernia.  Normal appearing gastric mucosa.  Patent pylorus.  Normal-appearing first and second portion of the duodenum.  Retroflexed views revealed a hiatal hernia.    Scope was withdrawn, a 54 [place] Maloney dilator was passed tofull  insertion easily. A look back revealed no apparent complication related to this maneuver. The scope was then withdrawn from the patient and the procedure completed.  COMPLICATIONS: There were no immediate complications.  ENDOSCOPIC IMPRESSION: Schatzki's ring  - status post dilation as described above;   Hiatal hernia.  RECOMMENDATIONS: Stop omeprazole for now; trial of Protonix 40 mg daily. Office visit with [place] in 6 weeks  REPEAT EXAM:  eSigned:  R. Garfield Cornea, MD [facility] 03/28/15 10:07 AM    CC:  CPT CODES: ICD CODES:  The ICD and CPT codes recommended by this software are interpretations from the data that the clinical staff has captured with the software.  The verification of the translation of this report to the ICD and CPT codes and modifiers is the sole responsibility of the health care institution and practicing physician where this report was generated.  [place]. will not be held responsible for the validity of the ICD and CPT codes included on this report.  AMA assumes no liability for data contained or not contained herein. CPT is a Designer, television/film set of the [employer].  PATIENT NAME:  Kara, [name] MR#: [id number]

## 2015-03-04 NOTE — Progress Notes (Signed)
Patient returned from Radiology via wheelchair.  Pain reported jaw pain "only when i move /open mouth a certain way" at a "10" when that occurs.  Dr. Gala Romney notified. Further recommendations made and noted for patient per Dr. Gala Romney in discharge instructions.  Daughter and patient verbalized understanding. Denies nausea, vomiting,  Shortness of breath, skin clean, dry and intact.  If recommendations do not help or symptoms worsen, persist she will contact MD or go to Emergency Room,.

## 2015-03-08 ENCOUNTER — Encounter (HOSPITAL_COMMUNITY): Payer: Self-pay | Admitting: Internal Medicine

## 2015-03-30 ENCOUNTER — Other Ambulatory Visit: Payer: Self-pay

## 2015-03-30 MED ORDER — FLUTICASONE PROPIONATE 50 MCG/ACT NA SUSP: 2.0000 | Freq: Every day | NASAL | Status: DC

## 2015-04-09 DIAGNOSIS — Z23 Encounter for immunization: Secondary | ICD-10-CM | POA: Insufficient documentation

## 2015-04-09 NOTE — Assessment & Plan Note (Signed)
Possibly inadequately treated as she c/o early satiety, poor appetite and weight losss, will have GI re eval

## 2015-04-09 NOTE — Assessment & Plan Note (Signed)
Controlled, no change in medication  

## 2015-04-09 NOTE — Assessment & Plan Note (Signed)
Ongoing weight loss with poor appetite and h/o colon tubular adenoma, refer to GI Access to food a problem but still refuses meals on wheels , daughter , sharon , present , and will continue to work with her  On this  Encouraged eating small frequent meals

## 2015-04-09 NOTE — Assessment & Plan Note (Signed)
Microscopic hematuria, GU to eval and treat

## 2015-04-09 NOTE — Assessment & Plan Note (Addendum)
Daily use of medcation keeps symptoms controlled, continue same

## 2015-04-09 NOTE — Assessment & Plan Note (Signed)
Controlled, no change in medication Hyperlipidemia:Low fat diet discussed and encouraged.   Lipid Panel  Lab Results  Component Value Date   CHOL 172 07/19/2014   HDL 62 07/19/2014   LDLCALC 85 07/19/2014   TRIG 127 07/19/2014   CHOLHDL 2.8 07/19/2014

## 2015-04-09 NOTE — Progress Notes (Signed)
   Kara [name]     MRN: [id number]      DOB: 24-Nov-1926   HPI Kara [name] is here specifically for f/u recent ED visit for acute GE , symptoms are greatly improved, denies any current nausea or loose stool, r follow up and re-evaluation of chronic medical conditions also done, medication management and review of any available recent lab and radiology data.  Preventive health is updated, specifically  Cancer screening and Immunization.   The PT denies any adverse reactions to current medications since the last visit.  There are no new concerns.  Ongoing poor appetite with weight loss, needs GI eval  ROS Denies recent fever or chills. Denies sinus pressure, nasal congestion, ear pain or sore throat. Denies chest congestion, productive cough or wheezing. Denies chest pains, palpitations and leg swelling Denies abdominal pain, nausea, vomiting,diarrhea or constipation.   Denies dysuria, frequency, hesitancy or incontinence. Denies uncontrolled  joint pain, swelling and limitation in mobility. Denies headaches, seizures, numbness, or tingling. Denies uncontroled  depression, anxiety or insomnia. Denies skin break down or rash.   PE  BP 130/60 mmHg  Pulse 66  Resp 18  Ht 5\' 2"  (1.575 m)  Wt 112 lb (50.803 kg)  BMI 20.48 kg/m2  SpO2 99%  Patient alert and oriented and in no cardiopulmonary distress.  HEENT: No facial asymmetry, EOMI,   oropharynx pink and moist.  Neck supple no JVD, no mass.  Chest: Clear to auscultation bilaterally.  CVS: S1, S2 no murmurs, no S3.Regular rate.  ABD: Soft non tender. No organomegaly or mass palapted , normal bS  Ext: No edema  MS: Adequate though reduced  ROM spine, shoulders, hips and knees.  Skin: Intact, no ulcerations or rash noted.  Psych: Good eye contact, normal affect. Memory intact not anxious or depressed appearing.  CNS: CN 2-12 intact, power,  normal throughout.no focal deficits noted.   Assessment & Plan   Recent  unintentional weight loss over several months Ongoing weight loss with poor appetite and h/o colon tubular adenoma, refer to GI Access to food a problem but still refuses meals on wheels , daughter , Kara [name] , present , and will continue to work with her  On this  Encouraged eating small frequent meals  Hematuria Microscopic hematuria, GU to eval and treat   GENERALIZED ANXIETY DISORDER Controlled, no change in medication   Senile osteoporosis Unable to take oral medication , still deciding on annual IV medication  GERD (gastroesophageal reflux disease) Possibly inadequately treated as she c/o early satiety, poor appetite and weight losss, will have GI re eval  Seasonal allergic rhinitis Daily use of medcation keeps symptoms controlled, continue same  Need for vaccination with 13-polyvalent pneumococcal conjugate vaccine After obtaining informed consent, the vaccine is  administered by LPN.   Hyperlipidemia LDL goal <100 Controlled, no change in medication Hyperlipidemia:Low fat diet discussed and encouraged.   Lipid Panel  Lab Results  Component Value Date   CHOL 172 07/19/2014   HDL 62 07/19/2014   LDLCALC 85 07/19/2014   TRIG 127 07/19/2014   CHOLHDL 2.8 07/19/2014

## 2015-04-09 NOTE — Assessment & Plan Note (Signed)
After obtaining informed consent, the vaccine is  administered by LPN.  

## 2015-04-09 NOTE — Assessment & Plan Note (Signed)
Unable to take oral medication , still deciding on annual IV medication

## 2015-04-12 ENCOUNTER — Ambulatory Visit: Payer: Medicare Other | Admitting: Urology

## 2015-04-12 ENCOUNTER — Telehealth: Payer: Self-pay | Admitting: Family Medicine

## 2015-04-12 NOTE — Telephone Encounter (Signed)
noted 

## 2015-04-13 ENCOUNTER — Encounter: Payer: Self-pay | Admitting: Nurse Practitioner

## 2015-04-13 ENCOUNTER — Ambulatory Visit (INDEPENDENT_AMBULATORY_CARE_PROVIDER_SITE_OTHER): Payer: Medicare Other | Admitting: Nurse Practitioner

## 2015-04-13 VITALS — BP 147/60 | HR 75 | Temp 98.0°F | Ht 63.0 in | Wt 110.2 lb

## 2015-04-13 DIAGNOSIS — Q394 Esophageal web: Secondary | ICD-10-CM

## 2015-04-13 DIAGNOSIS — R070 Pain in throat: Secondary | ICD-10-CM | POA: Insufficient documentation

## 2015-04-13 DIAGNOSIS — K222 Esophageal obstruction: Secondary | ICD-10-CM

## 2015-04-13 DIAGNOSIS — K219 Gastro-esophageal reflux disease without esophagitis: Secondary | ICD-10-CM
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
## 2015-04-13 NOTE — Progress Notes (Signed)
cc'ed to pcp °

## 2015-04-13 NOTE — Assessment & Plan Note (Signed)
Patient status post EGD with [facility] dilation for Schatzki's ring. States her swallowing is much improved. Continue to monitor and follow up as needed. Return for routine follow-up in 3 months.

## 2015-04-13 NOTE — Patient Instructions (Signed)
1. If her symptoms persist notify her primary care provider or [place] and he may need a referral back to an ear nose and throat doctor. 2. Continue taking her medications as prescribed. 3. Return for further evaluation in 3 months.

## 2015-04-13 NOTE — Assessment & Plan Note (Signed)
Patient with intermittent throat pain which generally is relieved with Sucrets and other throat lozenges. Is unlikely this is due to EGD irritation as it is been 6 weeks. Of note she has allergies and sinus drainage as well as ear pain on and off. She is since been started on medications for allergies including Flonase. Is very likely that sinus drainage and cervical lymphadenopathy could be a culprit for this. Instructed that if the pain persists she should contact her primary care or [place] for referral to ENT for further evaluation. She was seen by ENT couple years ago for similar problems. Return for routine follow-up in 3 months.

## 2015-04-13 NOTE — Assessment & Plan Note (Signed)
Current symptoms are generally improved. Patient has not taken the Protonix is ordered because her daughter read something about it that she did not like instructed her mom to not take it. Encouraged her to take her medications as prescribed. Return for routine follow-up in 3 months.

## 2015-05-02 ENCOUNTER — Other Ambulatory Visit: Payer: Self-pay | Admitting: Family Medicine

## 2015-05-03 ENCOUNTER — Ambulatory Visit (INDEPENDENT_AMBULATORY_CARE_PROVIDER_SITE_OTHER): Payer: Medicare Other | Admitting: Family Medicine

## 2015-05-03 ENCOUNTER — Encounter: Payer: Self-pay | Admitting: Family Medicine

## 2015-05-03 VITALS — BP 120/62 | HR 66 | Resp 18 | Ht 62.0 in | Wt 113.0 lb

## 2015-05-03 DIAGNOSIS — E785 Hyperlipidemia, unspecified: Secondary | ICD-10-CM | POA: Diagnosis not present

## 2015-05-03 DIAGNOSIS — E041 Nontoxic single thyroid nodule: Secondary | ICD-10-CM

## 2015-05-03 DIAGNOSIS — R7302 Impaired glucose tolerance (oral): Secondary | ICD-10-CM | POA: Diagnosis not present

## 2015-05-03 DIAGNOSIS — Z0000 Encounter for general adult medical examination without abnormal findings: Secondary | ICD-10-CM | POA: Insufficient documentation

## 2015-05-03 NOTE — Patient Instructions (Signed)
F/uend Sept , call if you need me before  Keep doing what you are doing, be careful not to fall, and keep your brain active!  Fasting labs1 week before f/u   Thanks for choosing [facility], we consider it a privelige to serve you.   Fall Prevention and Home Safety Falls cause injuries and can affect all age groups. It is possible to prevent falls.  HOW TO PREVENT FALLS  Wear shoes with rubber soles that do not have an opening for your toes.  Keep the inside and outside of your house well lit.  Use night lights throughout your home.  Remove clutter from floors.  Clean up floor spills.  Remove throw rugs or fasten them to the floor with carpet tape.  Do not place electrical cords across pathways.  Put grab bars by your tub, shower, and toilet. Do not use towel bars as grab bars.  Put handrails on both sides of the stairway. Fix loose handrails.  Do not climb on stools or stepladders, if possible.  Do not wax your floors.  Repair uneven or unsafe sidewalks, walkways, or stairs.  Keep items you use a lot within reach.  Be aware of pets.  Keep emergency numbers next to the telephone.  Put smoke detectors in your home and near bedrooms. Ask your doctor what other things you can do to prevent falls. Document Released: 08/04/2009 Document Revised: 04/08/2012 Document Reviewed: 01/08/2012 [facility] Patient Information 2015 [place], [place]. This information is not intended to replace advice given to you by your health care provider. Make sure you discuss any questions you have with your health care provider.

## 2015-05-03 NOTE — Progress Notes (Signed)
Subjective:    Patient ID: Kara [name], female    DOB: [birth year], 81 y.o.   MRN: [id number]  HPI Preventive Screening-Counseling & Management   Patient present here today for a Medicare annual wellness visit.   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 No  Over the past two weeks, have you felt little interest or pleasure in doing things? No  Have you lost interest or pleasure in daily life? No  Do you often feel hopeless? No  Do you cry easily over simple problems? No   Activities of Daily Living  In your present state of health, do you have any difficulty performing the following activities?  Driving?: Yes; transported by children  Managing money?: No Feeding yourself?:No Getting from bed to chair?:No Climbing a flight of stairs?: At times  Preparing food and eating?:no Bathing or showering?:No Getting dressed?:No Getting to the toilet?:No Using the toilet?:No Moving around from place to place?: Yes, at times   Fall Risk Assessment In the past year have you fallen or had a near fall?:unstedy if moves too quickly Are you currently taking any medications that make you dizzy?:No   Hearing Difficulties: No Do you often ask people to speak up or repeat themselves?:No Do you experience ringing or noises in your ears?:No Do you have difficulty understanding soft or whispered voices?:No  Cognitive Testing  Alert? Yes Normal Appearance?Yes    Oriented to person? Yes Place? Yes  Time? Yes  Displays appropriate judgment?Yes  Can read the correct time from a watch face? yes Are you having problems remembering things?No  Advanced Directives have been discussed with the patient?Yes and brochure provided, full code    List the Names of Other Physician/Practitioners you currently use: careteams updated   Indicate any recent Medical Services you may have received from other than [facility] providers in the past year (date may be approximate).   Assessment:    Annual Wellness Exam   Plan:    During the course of the visit the patient was educated and counseled about appropriate screening and preventive services including:  A healthy diet is rich in fruit, vegetables and whole grains. Poultry fish, nuts and beans are a healthy choice for protein rather then red meat. A low sodium diet and drinking 64 ounces of water daily is generally recommended. Oils and sweet should be limited. Carbohydrates especially for those who are diabetic or overweight, should be limited to 30-45 gram per meal. It is important to eat on a regular schedule, at least 3 times daily. Snacks should be primarily fruits, vegetables or nuts. It is important that you exercise regularly at least 30 minutes 5 times a week. If you develop chest pain, have severe difficulty breathing, or feel very tired, stop exercising immediately and seek medical attention  Immunization reviewed and updated. Cancer screening reviewed and updated    Patient Instructions (the written plan) was given to the patient.  Medicare Attestation  I have personally reviewed:  The patient's medical and social history  Their use of alcohol, tobacco or illicit drugs  Their current medications and supplements  The patient's functional ability including ADLs,fall risks, home safety risks, cognitive, and hearing and visual impairment  Diet and physical activities  Evidence for depression or mood  disorders  The patient's weight, height, BMI, and visual acuity have been recorded in the chart. I have made referrals, counseling, and provided education to the patient based on review of the above and I have provided the patient with a written personalized care plan for preventive services.      Review of Systems     Objective:   Physical Exam BP 120/62 mmHg  Pulse 66  Resp 18  Ht 5\' 2"  (1.575 m)  Wt 113 lb (51.256 kg)  BMI 20.66 kg/m2  SpO2 95%        Assessment & Plan:  Medicare annual wellness visit, subsequent Annual exam as documented. Counseling done  re healthy lifestyle involving commitment to 150 minutes exercise per week, heart healthy diet, .The importance of adequate sleep also discussed. Regular seat belt use and home safety, is also discussed.  Immunization and end of life issues  are specifically addressed at this visit.Recommend designating HCPOA to one/ two of her children, she has 6

## 2015-05-04 NOTE — Assessment & Plan Note (Signed)
Annual exam as documented. Counseling done  re healthy lifestyle involving commitment to 150 minutes exercise per week, heart healthy diet, .The importance of adequate sleep also discussed. Regular seat belt use and home safety, is also discussed.  Immunization and end of life issues  are specifically addressed at this visit.Recommend designating HCPOA to one/ two of her children, she has 6

## 2015-05-24 ENCOUNTER — Encounter: Payer: Self-pay | Admitting: Internal Medicine

## 2015-06-06 ENCOUNTER — Other Ambulatory Visit: Payer: Self-pay | Admitting: Obstetrics and Gynecology

## 2015-06-28 ENCOUNTER — Telehealth: Payer: Self-pay

## 2015-06-28 NOTE — Telephone Encounter (Signed)
Pt is requesting Omeprazole 20 mg bid with 90 day supply. She was supposed to be on Protonix. According to notes, she was afraid to take Protonix.

## 2015-06-29 ENCOUNTER — Other Ambulatory Visit: Payer: Self-pay | Admitting: Nurse Practitioner

## 2015-06-29 MED ORDER — OMEPRAZOLE 20 MG PO CPDR: 20.0000 mg | DELAYED_RELEASE_CAPSULE | Freq: Two times a day (BID) | ORAL | Status: DC

## 2015-06-29 NOTE — Telephone Encounter (Signed)
Please notify patient I sent in her Rx.

## 2015-06-29 NOTE — Telephone Encounter (Signed)
Pt is aware.  

## 2015-07-07 DIAGNOSIS — E785 Hyperlipidemia, unspecified: Secondary | ICD-10-CM | POA: Diagnosis not present

## 2015-07-07 DIAGNOSIS — E041 Nontoxic single thyroid nodule: Secondary | ICD-10-CM | POA: Diagnosis not present

## 2015-07-07 DIAGNOSIS — R7302 Impaired glucose tolerance (oral): Secondary | ICD-10-CM | POA: Diagnosis not present

## 2015-07-08 LAB — COMPLETE METABOLIC PANEL WITH GFR
ALK PHOS: 58 U/L (ref 33–130)
ALT: 12 U/L (ref 6–29)
AST: 21 U/L (ref 10–35)
Albumin: 3.9 g/dL (ref 3.6–5.1)
BILIRUBIN TOTAL: 0.4 mg/dL (ref 0.2–1.2)
BUN: 8 mg/dL (ref 7–25)
CO2: 30 mmol/L (ref 20–31)
Calcium: 9.5 mg/dL (ref 8.6–10.4)
Chloride: 103 mmol/L (ref 98–110)
Creat: 0.57 mg/dL — ABNORMAL LOW (ref 0.60–0.88)
GFR, EST NON AFRICAN AMERICAN: 85 mL/min (ref >=60)
GFR, Est African American: >89 mL/min (ref >=60)
Glucose, Bld: 92 mg/dL (ref 65–99)
Potassium: 4.6 mmol/L (ref 3.5–5.3)
Sodium: 142 mmol/L (ref 135–146)
TOTAL PROTEIN: 6.6 g/dL (ref 6.1–8.1)

## 2015-07-08 LAB — LIPID PANEL
Cholesterol: 169 mg/dL (ref 125–200)
HDL: 59 mg/dL (ref >=46)
LDL CALC: 84 mg/dL (ref <130)
TRIGLYCERIDES: 131 mg/dL (ref <150)
Total CHOL/HDL Ratio: 2.9 Ratio (ref <=5.0)
VLDL: 26 mg/dL (ref <30)

## 2015-07-08 LAB — BASIC METABOLIC PANEL WITH GFR
BUN: 8 mg/dL (ref 7–25)
CHLORIDE: 103 mmol/L (ref 98–110)
CO2: 30 mmol/L (ref 20–31)
Calcium: 9.5 mg/dL (ref 8.6–10.4)
Creat: 0.57 mg/dL — ABNORMAL LOW (ref 0.60–0.88)
Glucose, Bld: 92 mg/dL (ref 65–99)
POTASSIUM: 4.6 mmol/L (ref 3.5–5.3)
Sodium: 142 mmol/L (ref 135–146)

## 2015-07-08 LAB — HEMOGLOBIN A1C
HEMOGLOBIN A1C: 5.9 % — AB (ref <5.7)
Mean Plasma Glucose: 123 mg/dL — ABNORMAL HIGH (ref <117)

## 2015-07-08 LAB — TSH: TSH: 3.815 u[IU]/mL (ref 0.350–4.500)

## 2015-07-21 ENCOUNTER — Encounter: Payer: Self-pay | Admitting: Family Medicine

## 2015-07-21 ENCOUNTER — Ambulatory Visit (INDEPENDENT_AMBULATORY_CARE_PROVIDER_SITE_OTHER): Payer: Medicare Other | Admitting: Family Medicine

## 2015-07-21 VITALS — BP 130/66 | HR 60 | Resp 18 | Ht 62.0 in | Wt 112.1 lb

## 2015-07-21 DIAGNOSIS — J302 Other seasonal allergic rhinitis: Secondary | ICD-10-CM

## 2015-07-21 DIAGNOSIS — Z23 Encounter for immunization: Secondary | ICD-10-CM | POA: Diagnosis not present

## 2015-07-21 DIAGNOSIS — K2100 Gastro-esophageal reflux disease with esophagitis, without bleeding: Secondary | ICD-10-CM

## 2015-07-21 DIAGNOSIS — F411 Generalized anxiety disorder: Secondary | ICD-10-CM | POA: Diagnosis not present

## 2015-07-21 DIAGNOSIS — E785 Hyperlipidemia, unspecified: Secondary | ICD-10-CM

## 2015-07-21 DIAGNOSIS — M25512 Pain in left shoulder: Secondary | ICD-10-CM

## 2015-07-21 DIAGNOSIS — M549 Dorsalgia, unspecified: Secondary | ICD-10-CM

## 2015-07-21 DIAGNOSIS — R7302 Impaired glucose tolerance (oral): Secondary | ICD-10-CM

## 2015-07-21 DIAGNOSIS — L989 Disorder of the skin and subcutaneous tissue, unspecified: Secondary | ICD-10-CM | POA: Diagnosis not present

## 2015-07-21 NOTE — Progress Notes (Signed)
Subjective:    Patient ID: Kara [name], female    DOB: Nov 30, 1926, 81 y.o.   MRN: [id number]  HPI    Kara [name]     MRN: [id number]      DOB: 1927/01/25   HPI Kara [name] is here for follow up and re-evaluation of chronic medical conditions, medication management and review of any available recent lab and radiology data.  Preventive health is updated, specifically   Immunization.    The PT denies any adverse reactions to current medications since the last visit.  C/o raised and dark skin lesions in various places which she wants dermatology to evaluate   ROS Denies recent fever or chills. Denies sinus pressure, nasal congestion, ear pain or sore throat. Denies chest congestion, productive cough or wheezing. Denies chest pains, palpitations and leg swelling Denies abdominal pain, nausea, vomiting,diarrhea or constipation.   Denies dysuria, frequency, hesitancy or incontinence. Denies joint pain, swelling and limitation in mobility. Denies headaches, seizures, numbness, or tingling. Denies depression, anxiety or insomnia. Marland Kitchen   PE  BP 130/66 mmHg  Pulse 60  Resp 18  Ht 5\' 2"  (1.575 m)  Wt 112 lb 1.9 oz (50.857 kg)  BMI 20.50 kg/m2  SpO2 98%  Patient alert and oriented and in no cardiopulmonary distress.  HEENT: No facial asymmetry, EOMI,   oropharynx pink and moist.  Neck supple no JVD, no mass.  Chest: Clear to auscultation bilaterally.  CVS: S1, S2 no murmurs, no S3.Regular rate.  ABD: Soft non tender.   Ext: No edema  MS: Adequate ROM spine, shoulders, hips and knees.  Skin: Intact, hyperpigmented raised skin lesions on lower extremity and face  Psych: Good eye contact, normal affect. Memory intact not anxious or depressed appearing.  CNS: CN 2-12 intact, power,  normal throughout.no focal deficits noted.   Assessment & Plan   Hyperlipidemia LDL goal <100 Controlled, no change in medication Hyperlipidemia:Low fat diet discussed and  encouraged.   Lipid Panel  Lab Results  Component Value Date   CHOL 169 07/07/2015   HDL 59 07/07/2015   LDLCALC 84 07/07/2015   TRIG 131 07/07/2015   CHOLHDL 2.9 07/07/2015        GENERALIZED ANXIETY DISORDER Controlled, no change in medication   Seasonal allergic rhinitis Controlled, no change in medication   Shoulder pain, left Continue topical rubs, tylenol as needed, and regular mobility  Skin lesion Hyperpigmented raised skin lesions, dermatology to evaluate  Back pain without radiation No recent flaare  IGT (impaired glucose tolerance) Unchanged Patient educated about the importance of limiting  Carbohydrate intake , the need to commit to daily physical activity for a minimum of 30 minutes , and to commit weight loss. The fact that changes in all these areas will reduce or eliminate all together the development of diabetes is stressed.   Diabetic Labs Latest Ref Rng 07/07/2015 07/07/2015 02/28/2015 01/15/2015 07/19/2014  HbA1c <5.7 % - 5.9(H) - - 6.0(H)  Chol 125 - 200 mg/dL - 169 - - 172  HDL >=46 mg/dL - 59 - - 62  Calc LDL <130 mg/dL - 84 - - 85  Triglycerides <150 mg/dL - 131 - - 127  Creatinine 0.60 - 0.88 mg/dL 0.57(L) 0.57(L) 0.70 0.62 -   BP/Weight 07/21/2015 05/03/2015 04/13/2015 03/04/2015 02/11/2015 04/11/3085 02/26/8468  Systolic BP 629 528 413 244 - 010 272  Diastolic BP 66 62 60 49 - 66 60  Wt. (Lbs) 112.12 113 110.2 112 112.8 111.2 112  BMI  20.5 20.66 19.53 19.84 21.32 21.02 20.48   No flowsheet data found.     GERD (gastroesophageal reflux disease) Controlled, no change in medication       Review of Systems     Objective:   Physical Exam        Assessment & Plan:

## 2015-07-21 NOTE — Patient Instructions (Signed)
F/in 5 month, call if you need me beforwe  Flu vaccine today,  HAPPY BIRTHDAY .Marland KitchenMarland Kitchen63 tomorrow  You are referred to Dr Nevada Crane for skin exam   Labs  Look great!!!, no changes in medication

## 2015-07-22 NOTE — Assessment & Plan Note (Signed)
Controlled, no change in medication  

## 2015-07-22 NOTE — Assessment & Plan Note (Signed)
Unchanged Patient educated about the importance of limiting  Carbohydrate intake , the need to commit to daily physical activity for a minimum of 30 minutes , and to commit weight loss. The fact that changes in all these areas will reduce or eliminate all together the development of diabetes is stressed.   Diabetic Labs Latest Ref Rng 07/07/2015 07/07/2015 02/28/2015 01/15/2015 07/19/2014  HbA1c <5.7 % - 5.9(H) - - 6.0(H)  Chol 125 - 200 mg/dL - 169 - - 172  HDL >=46 mg/dL - 59 - - 62  Calc LDL <130 mg/dL - 84 - - 85  Triglycerides <150 mg/dL - 131 - - 127  Creatinine 0.60 - 0.88 mg/dL 0.57(L) 0.57(L) 0.70 0.62 -   BP/Weight 07/21/2015 05/03/2015 04/13/2015 03/04/2015 02/11/2015 01/18/1915 6/0/6004  Systolic BP 599 774 142 395 - 320 233  Diastolic BP 66 62 60 49 - 66 60  Wt. (Lbs) 112.12 113 110.2 112 112.8 111.2 112  BMI 20.5 20.66 19.53 19.84 21.32 21.02 20.48   No flowsheet data found.

## 2015-07-22 NOTE — Assessment & Plan Note (Signed)
Continue topical rubs, tylenol as needed, and regular mobility

## 2015-07-22 NOTE — Assessment & Plan Note (Signed)
Controlled, no change in medication Hyperlipidemia:Low fat diet discussed and encouraged.   Lipid Panel  Lab Results  Component Value Date   CHOL 169 07/07/2015   HDL 59 07/07/2015   LDLCALC 84 07/07/2015   TRIG 131 07/07/2015   CHOLHDL 2.9 07/07/2015

## 2015-07-22 NOTE — Assessment & Plan Note (Signed)
Hyperpigmented raised skin lesions, dermatology to evaluate

## 2015-07-22 NOTE — Assessment & Plan Note (Signed)
No recent flaare

## 2015-07-25 ENCOUNTER — Other Ambulatory Visit: Payer: Self-pay

## 2015-07-25 MED ORDER — ATORVASTATIN CALCIUM 10 MG PO TABS: 10.0000 mg | ORAL_TABLET | Freq: Every day | ORAL | Status: DC

## 2015-07-25 MED ORDER — CLONAZEPAM 0.5 MG PO TABS: ORAL_TABLET | ORAL | Status: DC

## 2015-07-25 MED ORDER — OMEPRAZOLE 20 MG PO CPDR: 20.0000 mg | DELAYED_RELEASE_CAPSULE | Freq: Every day | ORAL | Status: DC

## 2015-08-08 DIAGNOSIS — L304 Erythema intertrigo: Secondary | ICD-10-CM | POA: Diagnosis not present

## 2015-08-08 DIAGNOSIS — X32XXXD Exposure to sunlight, subsequent encounter: Secondary | ICD-10-CM | POA: Diagnosis not present

## 2015-08-08 DIAGNOSIS — L820 Inflamed seborrheic keratosis: Secondary | ICD-10-CM | POA: Diagnosis not present

## 2015-08-08 DIAGNOSIS — L570 Actinic keratosis: Secondary | ICD-10-CM | POA: Diagnosis not present

## 2015-08-08 DIAGNOSIS — L219 Seborrheic dermatitis, unspecified: Secondary | ICD-10-CM | POA: Diagnosis not present

## 2015-09-27 DIAGNOSIS — Z961 Presence of intraocular lens: Secondary | ICD-10-CM | POA: Diagnosis not present

## 2015-09-27 DIAGNOSIS — H353111 Nonexudative age-related macular degeneration, right eye, early dry stage: Secondary | ICD-10-CM | POA: Diagnosis not present

## 2015-09-27 DIAGNOSIS — H31012 Macula scars of posterior pole (postinflammatory) (post-traumatic), left eye: Secondary | ICD-10-CM | POA: Diagnosis not present

## 2015-10-17 ENCOUNTER — Emergency Department (HOSPITAL_COMMUNITY)
Admission: EM | Admit: 2015-10-17 | Discharge: 2015-10-17 | Disposition: A | Discharge status: Home / Self Care | Payer: Medicare Other | Attending: Emergency Medicine | Admitting: Emergency Medicine

## 2015-10-17 ENCOUNTER — Emergency Department (HOSPITAL_COMMUNITY): Payer: Medicare Other

## 2015-10-17 ENCOUNTER — Encounter (HOSPITAL_COMMUNITY): Payer: Self-pay | Admitting: *Deleted

## 2015-10-17 DIAGNOSIS — Z86018 Personal history of other benign neoplasm: Secondary | ICD-10-CM | POA: Diagnosis not present

## 2015-10-17 DIAGNOSIS — Z79899 Other long term (current) drug therapy: Secondary | ICD-10-CM | POA: Insufficient documentation

## 2015-10-17 DIAGNOSIS — G8929 Other chronic pain: Secondary | ICD-10-CM | POA: Insufficient documentation

## 2015-10-17 DIAGNOSIS — E785 Hyperlipidemia, unspecified: Secondary | ICD-10-CM | POA: Diagnosis not present

## 2015-10-17 DIAGNOSIS — R0981 Nasal congestion: Secondary | ICD-10-CM | POA: Diagnosis present

## 2015-10-17 DIAGNOSIS — J069 Acute upper respiratory infection, unspecified: Secondary | ICD-10-CM

## 2015-10-17 DIAGNOSIS — K219 Gastro-esophageal reflux disease without esophagitis: Secondary | ICD-10-CM | POA: Diagnosis not present

## 2015-10-17 DIAGNOSIS — F419 Anxiety disorder, unspecified: Secondary | ICD-10-CM | POA: Diagnosis not present

## 2015-10-17 DIAGNOSIS — Z880 Allergy status to penicillin: Secondary | ICD-10-CM | POA: Insufficient documentation

## 2015-10-17 DIAGNOSIS — Z7951 Long term (current) use of inhaled steroids: Secondary | ICD-10-CM | POA: Diagnosis not present

## 2015-10-17 DIAGNOSIS — Z8739 Personal history of other diseases of the musculoskeletal system and connective tissue: Secondary | ICD-10-CM | POA: Diagnosis not present

## 2015-10-17 DIAGNOSIS — R05 Cough: Secondary | ICD-10-CM | POA: Diagnosis not present

## 2015-10-17 DIAGNOSIS — Z87738 Personal history of other specified (corrected) congenital malformations of digestive system: Secondary | ICD-10-CM | POA: Insufficient documentation

## 2015-10-17 DIAGNOSIS — Z720 Tobacco use: Secondary | ICD-10-CM | POA: Diagnosis not present

## 2015-10-17 DIAGNOSIS — R0602 Shortness of breath: Secondary | ICD-10-CM | POA: Diagnosis not present

## 2015-10-17 NOTE — ED Notes (Signed)
Pt comes in with cough starting last week. Pt states she been taking nasal decongestants with no relief. Pt denies any n/v/d.

## 2015-10-17 NOTE — Discharge Instructions (Signed)
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
## 2015-10-20 ENCOUNTER — Telehealth: Payer: Self-pay | Admitting: Family Medicine

## 2015-10-20 NOTE — Telephone Encounter (Signed)
Patient went to the ER on Mon Dec 26th and was told she had a viral cold, she is stating that she is no better and is very choked up and states that the ER didn't give her anything to take, please advise?

## 2015-10-21 ENCOUNTER — Other Ambulatory Visit: Payer: Self-pay

## 2015-10-21 MED ORDER — AZELASTINE HCL 0.1 % NA SOLN: 2.0000 | Freq: Two times a day (BID) | NASAL | Status: DC

## 2015-10-21 NOTE — Telephone Encounter (Signed)
Patient aware.

## 2015-10-21 NOTE — Telephone Encounter (Signed)
Additional nasal spray astelin sent in, continue daily flonase, start twice daily netty pot flush, non of these will raise blood pressure, also advise OTC robitussin DM 1 tsp twice daily for cough Explain allergies are reason for symptoms , needs to use meds daily for best effect Also honey ,1 tsp recommended to help suppress cough and soothe throat

## 2015-10-21 NOTE — Telephone Encounter (Signed)
Please advise.  Patient has nasal congestion and cough.   Her bp was elevated in the ED.  She states that she was told this was from taking otc decongestants.  Please advise.

## 2015-10-28 ENCOUNTER — Ambulatory Visit (INDEPENDENT_AMBULATORY_CARE_PROVIDER_SITE_OTHER): Payer: Medicare Other | Admitting: Family Medicine

## 2015-10-28 VITALS — BP 146/72 | HR 76 | Resp 18 | Ht 62.0 in | Wt 113.0 lb

## 2015-10-28 DIAGNOSIS — E785 Hyperlipidemia, unspecified: Secondary | ICD-10-CM | POA: Diagnosis not present

## 2015-10-28 DIAGNOSIS — J309 Allergic rhinitis, unspecified: Secondary | ICD-10-CM | POA: Insufficient documentation

## 2015-10-28 DIAGNOSIS — J3089 Other allergic rhinitis: Secondary | ICD-10-CM | POA: Diagnosis not present

## 2015-10-28 DIAGNOSIS — K2100 Gastro-esophageal reflux disease with esophagitis, without bleeding: Secondary | ICD-10-CM

## 2015-10-28 HISTORY — DX: Allergic rhinitis, unspecified: J30.9

## 2015-10-28 NOTE — Patient Instructions (Signed)
F/u as before, call if you need me sooner  Fullness in head is due to allergies, mainly because of wide changes in temperature  Keep warm  Use vicks  Vapor rub and saline nasal flushes 2 to 3 times daily. Benadryl 25 mg one daily adn either zyrtec or allegra, lowest dose available over the counter one daily, until allergies improve  No sign of infection on exam  Abdominal exam is normal  Hope you feel better soon

## 2015-10-29 ENCOUNTER — Encounter: Payer: Self-pay | Admitting: Family Medicine

## 2015-10-29 NOTE — Assessment & Plan Note (Signed)
Increased and uncontrolled symptoms, pt opting not to use nasal sprays, will add OTC allergy med, non sedating to the benadryl which she has depended on for years. No s/s of nfection

## 2015-10-29 NOTE — Assessment & Plan Note (Signed)
Controlled, no change in medication Hyperlipidemia:Low fat diet discussed and encouraged.   Lipid Panel  Lab Results  Component Value Date   CHOL 169 07/07/2015   HDL 59 07/07/2015   LDLCALC 84 07/07/2015   TRIG 131 07/07/2015   CHOLHDL 2.9 07/07/2015       

## 2015-10-29 NOTE — Assessment & Plan Note (Signed)
Controlled, no change in medication  

## 2015-10-29 NOTE — Progress Notes (Signed)
   Subjective:    Patient ID: Kara [name], female    DOB: 11-10-1926, 82 y.o.   MRN: [id number]  HPI C/o head fullness , no fever or chills, nasal drainage is clear, c/o ear fullness, denies sore throat, dry cough 2 loose stool this morning, no nausea or vomit, no other family member affected  Review of Systems See HPI  Denies chest pains, palpitations and leg swelling   Denies dysuria, frequency, hesitancy or incontinence. Denies joint pain, swelling and limitation in mobility. Denies headaches, seizures, numbness, or tingling. Denies depression, anxiety or insomnia. Denies skin break down or rash.        Objective:   Physical Exam BP 146/72 mmHg  Pulse 76  Resp 18  Ht 5\' 2"  (1.575 m)  Wt 113 lb (51.256 kg)  BMI 20.66 kg/m2  SpO2 99% Patient alert and oriented and in no cardiopulmonary distress.  HEENT: No facial asymmetry, EOMI,   oropharynx pink and moist.  Neck decreased though adequate ROM no JVD, no mass.TM clear bilaterally, no cerumen impaction, no sinus tenderness,  Mild erythema and edema of nasal mucosa  Chest: Clear to auscultation bilaterally.  CVS: S1, S2 no murmurs, no S3.Regular rate.  ABD: Soft no localized  Tenderness, guarding or rebound. Normal bowel sounds, no organomegaly or mass.  Ext: No edema  MS: Adequate ROM spine, shoulders, hips and knees.  Skin: Intact, no ulcerations or rash noted.  Psych: Good eye contact, normal affect. Memory intact not anxious or depressed appearing.  CNS: CN 2-12 intact, power,  normal throughout.no focal deficits noted.       Assessment & Plan:  Allergic rhinitis Increased and uncontrolled symptoms, pt opting not to use nasal sprays, will add OTC allergy med, non sedating to the benadryl which she has depended on for years. No s/s of nfection  GERD (gastroesophageal reflux disease) Controlled, no change in medication   Hyperlipidemia LDL goal <100 Controlled, no change in  medication Hyperlipidemia:Low fat diet discussed and encouraged.   Lipid Panel  Lab Results  Component Value Date   CHOL 169 07/07/2015   HDL 59 07/07/2015   LDLCALC 84 07/07/2015   TRIG 131 07/07/2015   CHOLHDL 2.9 07/07/2015

## 2015-10-31 ENCOUNTER — Other Ambulatory Visit: Payer: Self-pay | Admitting: Family Medicine

## 2015-11-17 ENCOUNTER — Ambulatory Visit (INDEPENDENT_AMBULATORY_CARE_PROVIDER_SITE_OTHER): Payer: Medicare Other | Admitting: Family Medicine

## 2015-11-17 ENCOUNTER — Encounter: Payer: Self-pay | Admitting: Family Medicine

## 2015-11-17 VITALS — BP 142/64 | HR 82 | Resp 18 | Ht 62.0 in | Wt 114.0 lb

## 2015-11-17 DIAGNOSIS — M810 Age-related osteoporosis without current pathological fracture: Secondary | ICD-10-CM

## 2015-11-17 DIAGNOSIS — F411 Generalized anxiety disorder: Secondary | ICD-10-CM

## 2015-11-17 DIAGNOSIS — Z1211 Encounter for screening for malignant neoplasm of colon: Secondary | ICD-10-CM | POA: Diagnosis not present

## 2015-11-17 DIAGNOSIS — R2681 Unsteadiness on feet: Secondary | ICD-10-CM

## 2015-11-17 DIAGNOSIS — D509 Iron deficiency anemia, unspecified: Secondary | ICD-10-CM

## 2015-11-17 DIAGNOSIS — E785 Hyperlipidemia, unspecified: Secondary | ICD-10-CM | POA: Diagnosis not present

## 2015-11-17 DIAGNOSIS — K2100 Gastro-esophageal reflux disease with esophagitis, without bleeding: Secondary | ICD-10-CM

## 2015-11-17 DIAGNOSIS — R7302 Impaired glucose tolerance (oral): Secondary | ICD-10-CM

## 2015-11-17 LAB — POC HEMOCCULT BLD/STL (OFFICE/1-CARD/DIAGNOSTIC): Fecal Occult Blood, POC: NEGATIVE

## 2015-11-17 NOTE — Patient Instructions (Signed)
Annual wellness July 13 or after ,call if you need me sooner  Exam today is good and thankful that you feeel better  Fasting labs end May please  No hidden blood in stool  Stay on same medications and continue to listen to your body and be careful not to fall!  Thanks for choosing [facility], we consider it a privelige to serve you.  All the best for 2017!

## 2015-11-17 NOTE — Progress Notes (Signed)
   Subjective:    Patient ID: Kara [name], female    DOB: 1927-07-10, 82 y.o.   MRN: [id number]  HPI    Kara [name]     MRN: [id number]      DOB: [birth year]   HPI Kara [name] is here for follow up and re-evaluation of chronic medical conditions, medication management and review of any available recent lab and radiology data.  Preventive health is updated, specifically  Cancer screening and Immunization.   Questions or concerns regarding consultations or procedures which the PT has had in the interim are  addressed. The PT denies any adverse reactions to current medications since the last visit.  There are no new concerns.  There are no specific complaints   ROS Denies recent fever or chills. Denies sinus pressure, nasal congestion, ear pain or sore throat. Denies chest congestion, productive cough or wheezing. Denies chest pains, palpitations and leg swelling Denies abdominal pain, nausea, vomiting,diarrhea or constipation.   Denies dysuria, frequency, hesitancy or incontinence. Denies joint pain, swelling and limitation in mobility. Denies headaches, seizures, numbness, or tingling. Denies depression, anxiety or insomnia. Denies skin break down or rash.   PE  BP 142/64 mmHg  Pulse 82  Resp 18  Ht 5\' 2"  (1.575 m)  Wt 114 lb 0.6 oz (51.728 kg)  BMI 20.85 kg/m2  SpO2 98%  Patient alert and oriented and in no cardiopulmonary distress.  HEENT: No facial asymmetry, EOMI,   oropharynx pink and moist.  Neck supple no JVD, no mass.  Chest: Clear to auscultation bilaterally.  CVS: S1, S2 no murmurs, no S3.Regular rate.  ABD: Soft non tender. No organomegaly or mass, normal BS Rectal: no mass, heme negative stool  Ext: No edema  MS: Adequate though reduced ROM spine, shoulders, hips and knees.  Skin: Intact, no ulcerations or rash noted.  Psych: Good eye contact, normal affect. Memory intact not anxious or depressed appearing.  CNS: CN 2-12 intact, power,  normal  throughout.no focal deficits noted.   Assessment & Plan   Hyperlipidemia LDL goal <100 Controlled, no change in medication Hyperlipidemia:Low fat diet discussed and encouraged.   Lipid Panel  Lab Results  Component Value Date   CHOL 169 07/07/2015   HDL 59 07/07/2015   LDLCALC 84 07/07/2015   TRIG 131 07/07/2015   CHOLHDL 2.9 07/07/2015   Updated lab needed at/ before next visit.      Unsteady gait Uses assistive device as needed, and benefits on an intermittent basis from physical therapy  GENERALIZED ANXIETY DISORDER Controlled, no change in medication   Senile osteoporosis intolerant of bisphosphonate, needs to rely on weight bearing activity and calcium supplement  GERD (gastroesophageal reflux disease) Controlled, no change in medication      Review of Systems     Objective:   Physical Exam        Assessment & Plan:

## 2015-11-19 NOTE — Assessment & Plan Note (Signed)
Controlled, no change in medication  

## 2015-11-19 NOTE — Assessment & Plan Note (Signed)
Controlled, no change in medication Hyperlipidemia:Low fat diet discussed and encouraged.   Lipid Panel  Lab Results  Component Value Date   CHOL 169 07/07/2015   HDL 59 07/07/2015   LDLCALC 84 07/07/2015   TRIG 131 07/07/2015   CHOLHDL 2.9 07/07/2015   Updated lab needed at/ before next visit.

## 2015-11-19 NOTE — Assessment & Plan Note (Signed)
Uses assistive device as needed, and benefits on an intermittent basis from physical therapy

## 2015-11-19 NOTE — Assessment & Plan Note (Signed)
intolerant of bisphosphonate, needs to rely on weight bearing activity and calcium supplement

## 2016-01-24 ENCOUNTER — Other Ambulatory Visit: Payer: Self-pay | Admitting: Family Medicine

## 2016-02-07 ENCOUNTER — Encounter: Payer: Self-pay | Admitting: Internal Medicine

## 2016-03-15 ENCOUNTER — Other Ambulatory Visit: Payer: Self-pay | Admitting: Family Medicine

## 2016-03-15 DIAGNOSIS — E785 Hyperlipidemia, unspecified: Secondary | ICD-10-CM | POA: Diagnosis not present

## 2016-03-15 DIAGNOSIS — R7301 Impaired fasting glucose: Secondary | ICD-10-CM | POA: Diagnosis not present

## 2016-03-15 DIAGNOSIS — D509 Iron deficiency anemia, unspecified: Secondary | ICD-10-CM | POA: Diagnosis not present

## 2016-03-15 LAB — CBC WITH DIFFERENTIAL/PLATELET
BASOS ABS: 88 {cells}/uL (ref 0–200)
Basophils Relative: 2 %
EOS ABS: 88 {cells}/uL (ref 15–500)
EOS PCT: 2 %
HCT: 32.6 % — ABNORMAL LOW (ref 35.0–45.0)
HEMOGLOBIN: 10.8 g/dL — AB (ref 11.7–15.5)
LYMPHS ABS: 1980 {cells}/uL (ref 850–3900)
Lymphocytes Relative: 45 %
MCH: 30.7 pg (ref 27.0–33.0)
MCHC: 33.1 g/dL (ref 32.0–36.0)
MCV: 92.6 fL (ref 80.0–100.0)
MPV: 9.8 fL (ref 7.5–12.5)
Monocytes Absolute: 528 cells/uL (ref 200–950)
Monocytes Relative: 12 %
NEUTROS PCT: 39 %
Neutro Abs: 1716 cells/uL (ref 1500–7800)
Platelets: 343 10*3/uL (ref 140–400)
RBC: 3.52 MIL/uL — ABNORMAL LOW (ref 3.80–5.10)
RDW: 15.7 % — ABNORMAL HIGH (ref 11.0–15.0)
WBC: 4.4 10*3/uL (ref 3.8–10.8)

## 2016-03-15 LAB — COMPREHENSIVE METABOLIC PANEL
ALBUMIN: 4 g/dL (ref 3.6–5.1)
ALK PHOS: 48 U/L (ref 33–130)
ALT: 12 U/L (ref 6–29)
AST: 19 U/L (ref 10–35)
BILIRUBIN TOTAL: 0.6 mg/dL (ref 0.2–1.2)
BUN: 8 mg/dL (ref 7–25)
CO2: 27 mmol/L (ref 20–31)
CREATININE: 0.69 mg/dL (ref 0.60–0.88)
Calcium: 9.3 mg/dL (ref 8.6–10.4)
Chloride: 103 mmol/L (ref 98–110)
GLUCOSE: 100 mg/dL — AB (ref 65–99)
Potassium: 4.2 mmol/L (ref 3.5–5.3)
SODIUM: 140 mmol/L (ref 135–146)
Total Protein: 6.8 g/dL (ref 6.1–8.1)

## 2016-03-15 LAB — LIPID PANEL
Cholesterol: 169 mg/dL (ref 125–200)
HDL: 72 mg/dL (ref >=46)
LDL Cholesterol: 78 mg/dL (ref <130)
Total CHOL/HDL Ratio: 2.3 Ratio (ref <=5.0)
Triglycerides: 93 mg/dL (ref <150)
VLDL: 19 mg/dL (ref <30)

## 2016-03-15 LAB — HEMATOCRIT: HEMATOCRIT: 32.6 % — AB (ref 35.0–45.0)

## 2016-03-16 ENCOUNTER — Other Ambulatory Visit: Payer: Self-pay

## 2016-03-16 LAB — HEMOGLOBIN A1C
Hgb A1c MFr Bld: 5.8 % — ABNORMAL HIGH (ref <5.7)
MEAN PLASMA GLUCOSE: 120 mg/dL

## 2016-03-21 ENCOUNTER — Other Ambulatory Visit: Payer: Self-pay

## 2016-03-21 MED ORDER — OMEPRAZOLE 20 MG PO CPDR: 20.0000 mg | DELAYED_RELEASE_CAPSULE | Freq: Every day | ORAL | Status: DC

## 2016-04-05 ENCOUNTER — Telehealth: Payer: Self-pay | Admitting: Family Medicine

## 2016-04-05 NOTE — Telephone Encounter (Signed)
pt is having tingling in her body - had a tick bite about a week ago.. I offered her next Thursday at 3pm but she said she may go to the ED because she can't wait that long. Therefore she wants Dr. Moshe Cipro to look at her lab results now to see if anything was off - she has an apt in July but doesn't want to wait that long.

## 2016-04-06 NOTE — Telephone Encounter (Signed)
Called patient no voicemail to leave message.   Will try again.

## 2016-04-10 NOTE — Telephone Encounter (Signed)
Noted that patient has an appointment 6/21

## 2016-04-11 ENCOUNTER — Encounter: Payer: Self-pay | Admitting: Family Medicine

## 2016-04-11 ENCOUNTER — Encounter: Payer: Self-pay | Admitting: Internal Medicine

## 2016-04-11 ENCOUNTER — Ambulatory Visit (INDEPENDENT_AMBULATORY_CARE_PROVIDER_SITE_OTHER): Payer: Medicare Other | Admitting: Family Medicine

## 2016-04-11 VITALS — BP 124/62 | HR 74 | Resp 16 | Ht 62.0 in | Wt 112.0 lb

## 2016-04-11 DIAGNOSIS — K2100 Gastro-esophageal reflux disease with esophagitis, without bleeding: Secondary | ICD-10-CM

## 2016-04-11 DIAGNOSIS — F411 Generalized anxiety disorder: Secondary | ICD-10-CM

## 2016-04-11 DIAGNOSIS — J302 Other seasonal allergic rhinitis: Secondary | ICD-10-CM

## 2016-04-11 DIAGNOSIS — E785 Hyperlipidemia, unspecified: Secondary | ICD-10-CM

## 2016-04-11 DIAGNOSIS — R1010 Upper abdominal pain, unspecified: Secondary | ICD-10-CM

## 2016-04-11 DIAGNOSIS — R7302 Impaired glucose tolerance (oral): Secondary | ICD-10-CM

## 2016-04-11 MED ORDER — LEVOCETIRIZINE DIHYDROCHLORIDE 5 MG PO TABS: 5.0000 mg | ORAL_TABLET | Freq: Every evening | ORAL | Status: DC

## 2016-04-11 NOTE — Patient Instructions (Addendum)
Annual wellness in 4 month, call if you need me sooner  Excellent labs and weight is stable  You are referred to Dr Sydell Axon for folllow up of abdominal pain and to discuss prilosec and reflux treatment  Stop benadryl, new is xyzal once daily for allergy, itch,   Keep skin moisturized with lotion and/ or baaby oil  Thank you  for choosing [facility]. We consider it a privelige to serve you.  Delivering excellent health care in a caring and  compassionate way is our goal.  Partnering with you,  so that together we can achieve this goal is our strategy.

## 2016-04-14 DIAGNOSIS — R1010 Upper abdominal pain, unspecified: Secondary | ICD-10-CM | POA: Insufficient documentation

## 2016-04-14 NOTE — Assessment & Plan Note (Signed)
Inadequately controlled on benadryl which she has relied on for years, trial of xyzal

## 2016-04-14 NOTE — Assessment & Plan Note (Signed)
Controlled, no change in medication  

## 2016-04-14 NOTE — Assessment & Plan Note (Signed)
Controlled , however pt wishes to d/c her PPI due to s/e she has heard of on the tv, she does have h/o stricture, I advise her to continue med and discuss with GI when she sees them

## 2016-04-14 NOTE — Assessment & Plan Note (Signed)
incraesed abdominal pain, upper and lower in pt with GERD and h/o tubular adenoma of colon, needs GI re evaluation

## 2016-04-14 NOTE — Assessment & Plan Note (Signed)
Improved Patient educated about the importance of limiting  Carbohydrate intake , the need to commit to daily physical activity for a minimum of 30 minutes , and to commit weight loss. The fact that changes in all these areas will reduce or eliminate all together the development of diabetes is stressed.   Diabetic Labs Latest Ref Rng 03/15/2016 07/07/2015 07/07/2015 02/28/2015 01/15/2015  HbA1c <5.7 % 5.8(H) - 5.9(H) - -  Chol 125 - 200 mg/dL 169 - 169 - -  HDL >=46 mg/dL 72 - 59 - -  Calc LDL <130 mg/dL 78 - 84 - -  Triglycerides <150 mg/dL 93 - 131 - -  Creatinine 0.60 - 0.88 mg/dL 0.69 0.57(L) 0.57(L) 0.70 0.62   BP/Weight 04/11/2016 11/17/2015 10/28/2015 10/17/2015 07/21/2015 05/03/2015 [id number]  Systolic BP A999333 A999333 [id number] A999333 [id number]  Diastolic BP 62 64 72 93 66 62 60  Wt. (Lbs) 112 114.04 113 112 112.12 113 110.2  BMI 20.48 20.85 20.66 20.48 20.5 20.66 19.53   No flowsheet data found.

## 2016-04-14 NOTE — Progress Notes (Signed)
Kara [name]     MRN: [id number]      DOB: October 01, 1927   HPI Ms. Kara [name] is here for follow up and re-evaluation of chronic medical conditions, medication management and review of any available recent lab and radiology data.  Preventive health is updated, specifically  Cancer screening and Immunization.   Questions or concerns regarding consultations or procedures which the PT has had in the interim are  addressed. The PT denies any adverse reactions to current medications since the last visit.  C/o abdominal pain, lower  Left as well as epigastric pain, feels she needs GI re evaluation Also c/o generalized "tingling' and itching, no visible rash, feels that she is not responding to her allergy medication as she has in the past ROS Denies recent fever or chills. Denies sinus pressure, nasal congestion, ear pain or sore throat. Denies chest congestion, productive cough or wheezing. Denies chest pains, palpitations and leg swelling Denies  nausea, vomiting,diarrhea or constipation.   Denies dysuria, frequency, hesitancy or incontinence. Denies uncontrolled  joint pain, swelling and limitation in mobility. Denies headaches, seizures, numbness, or tingling. Denies depression, uncontrolled anxiety or insomnia. Denies skin break down or rash.   PE  BP 124/62 mmHg  Pulse 74  Resp 16  Ht 5\' 2"  (1.575 m)  Wt 112 lb (50.803 kg)  BMI 20.48 kg/m2  SpO2 97%  Patient alert and oriented and in no cardiopulmonary distress.  HEENT: No facial asymmetry, EOMI,   oropharynx pink and moist.  Neck supple no JVD, no mass.  Chest: Clear to auscultation bilaterally.  CVS: S1, S2 no murmurs, no S3.Regular rate.  ABD: Soft superficial tenderness in LLQ, no guarding or rebound, no palpable mass or organomegaly, normal BS.   Ext: No edema  MS: Adequate though reduced ROM spine, shoulders, hips and knees.  Skin: Intact, no ulcerations or rash noted.  Psych: Good eye contact, normal affect.  Memory intact not anxious or depressed appearing.  CNS: CN 2-12 intact, power,  normal throughout.no focal deficits noted.   Assessment & Plan   Pain of upper abdomen incraesed abdominal pain, upper and lower in pt with GERD and h/o tubular adenoma of colon, needs GI re evaluation  GERD (gastroesophageal reflux disease) Controlled , however pt wishes to d/c her PPI due to s/e she has heard of on the tv, she does have h/o stricture, I advise her to continue med and discuss with GI when she sees them  Hyperlipidemia LDL goal <100 Hyperlipidemia:Low fat diet discussed and encouraged.   Lipid Panel  Lab Results  Component Value Date   CHOL 169 03/15/2016   HDL 72 03/15/2016   LDLCALC 78 03/15/2016   TRIG 93 03/15/2016   CHOLHDL 2.3 03/15/2016      Controlled, no change in medication   Seasonal allergic rhinitis Inadequately controlled on benadryl which she has relied on for years, trial of xyzal  GENERALIZED ANXIETY DISORDER Controlled, no change in medication   IGT (impaired glucose tolerance) Improved Patient educated about the importance of limiting  Carbohydrate intake , the need to commit to daily physical activity for a minimum of 30 minutes , and to commit weight loss. The fact that changes in all these areas will reduce or eliminate all together the development of diabetes is stressed.   Diabetic Labs Latest Ref Rng 03/15/2016 07/07/2015 07/07/2015 02/28/2015 01/15/2015  HbA1c <5.7 % 5.8(H) - 5.9(H) - -  Chol 125 - 200 mg/dL 169 - 169 - -  HDL >=  46 mg/dL 72 - 59 - -  Calc LDL <130 mg/dL 78 - 84 - -  Triglycerides <150 mg/dL 93 - 131 - -  Creatinine 0.60 - 0.88 mg/dL 0.69 0.57(L) 0.57(L) 0.70 0.62   BP/Weight 04/11/2016 11/17/2015 10/28/2015 10/17/2015 07/21/2015 05/03/2015 Q000111Q  Systolic BP A999333 A999333 [id number] [id number] [id number]  Diastolic BP 62 64 72 93 66 62 60  Wt. (Lbs) 112 114.04 113 112 112.12 113 110.2  BMI 20.48 20.85 20.66 20.48 20.5 20.66 19.53   No flowsheet  data found.

## 2016-04-14 NOTE — Assessment & Plan Note (Signed)
Hyperlipidemia:Low fat diet discussed and encouraged.   Lipid Panel  Lab Results  Component Value Date   CHOL 169 03/15/2016   HDL 72 03/15/2016   LDLCALC 78 03/15/2016   TRIG 93 03/15/2016   CHOLHDL 2.3 03/15/2016      Controlled, no change in medication

## 2016-04-25 ENCOUNTER — Other Ambulatory Visit: Payer: Self-pay

## 2016-04-25 MED ORDER — ATORVASTATIN CALCIUM 10 MG PO TABS: 10.0000 mg | ORAL_TABLET | Freq: Every day | ORAL | Status: DC

## 2016-05-07 ENCOUNTER — Encounter: Payer: Medicare Other | Admitting: Family Medicine

## 2016-05-16 ENCOUNTER — Encounter: Payer: Medicare Other | Admitting: Family Medicine

## 2016-05-22 ENCOUNTER — Ambulatory Visit: Payer: Medicare Other | Admitting: Nurse Practitioner

## 2016-05-29 ENCOUNTER — Ambulatory Visit (INDEPENDENT_AMBULATORY_CARE_PROVIDER_SITE_OTHER): Payer: Medicare Other | Admitting: Gastroenterology

## 2016-05-29 ENCOUNTER — Encounter: Payer: Self-pay | Admitting: Gastroenterology

## 2016-05-29 VITALS — BP 131/64 | HR 74 | Temp 98.2°F | Ht 63.0 in | Wt 108.4 lb

## 2016-05-29 DIAGNOSIS — K589 Irritable bowel syndrome without diarrhea: Secondary | ICD-10-CM

## 2016-05-29 DIAGNOSIS — R6881 Early satiety: Secondary | ICD-10-CM

## 2016-05-29 DIAGNOSIS — R1010 Upper abdominal pain, unspecified: Secondary | ICD-10-CM

## 2016-05-29 DIAGNOSIS — R634 Abnormal weight loss: Secondary | ICD-10-CM | POA: Diagnosis not present

## 2016-05-29 HISTORY — DX: Early satiety: R68.81

## 2016-05-29 MED ORDER — PANTOPRAZOLE SODIUM 40 MG PO TBEC: 40.0000 mg | DELAYED_RELEASE_TABLET | Freq: Every day | ORAL | 3 refills | Status: DC

## 2016-05-29 NOTE — Assessment & Plan Note (Signed)
82 year old female with history of GERD, IBS, abdominal pain who presents with worsening upper abdominal pain over the last several months. Complains of postprandial component. Complains of early satiety. 6 pound weight loss since January. Last upper endoscopy 2016. Patient underwent dilation, denies further dysphagia. Would consider rule out early satiety. Would also switch from omeprazole to pantoprazole 40 mg daily. Will touch base with her in the near future to see how she is doing and go over her gastric emptying study results.  Suspect postprandial loose stools related IBS. May consider adding dicyclomine, low dose when necessary in the near future but would like to make one medication change of the time because patient is very sensitive (anxious) to new medications. Discussed with her and her daughter today. We also discussed that she was technically due for surveillance colonoscopy this year but guidelines or not clear when it comes to age and ongoing surveillance. At this time patient is not interested in pursuing surveillance colonoscopies. Would be interested in colonoscopy only if used as a diagnostic or therapeutic maneuver. We have taken her off the recall list based on this decision.

## 2016-05-29 NOTE — Progress Notes (Signed)
cc'ed to pcp °

## 2016-05-29 NOTE — Patient Instructions (Signed)
1. Gastric emptying test scheduled. 2. Stop omeprazole. Start pantoprazole 40 mg, take one 30 minutes before breakfast. 3. We will touch base once results are back, next step to be determined by your results and how you're feeling on the new medication.

## 2016-05-29 NOTE — Progress Notes (Signed)
Primary Care Physician: [name], MD  Primary Gastroenterologist:  Garfield Cornea, MD   Chief Complaint  Patient presents with  . Abdominal Pain    HPI: Kara [name] is a 82 y.o. female here For follow-up. She is last seen in June 2016. Since back for management of abdominal pain. EGD in May 2016 with Schatzki ring status post dilation, hiatal hernia. Shortly after her EGD she presented back to endoscopy with mandible pain. Upper GI contrast swallow evaluation found a perforation. Seen last year with complaints of occasional throat pain. She was advised to switch from omeprazole to pantoprazole after her EGD. Patient believes that she took at that she's not sure. According to our records she reported that her daughter read something about it and she didn't want to take it. Patient also told us that Prilosec made her hyper and she couldn't take it. Since her office visit she's also been on omeprazole twice a day per her request. Patient states she didn't tolerate it, didn't like the way it made her feel so she went back to once daily.  Patient complains of daily epigastric pain. Seems to be worse after she eats breakfast. Doesn't really matter what she eats. Denies typical heartburn. Complains of early satiety. Complains of weight loss. Documented 6 pound weight loss since January. She has a daily bowel movement. Sometimes if her stomach gets upset she may have twice daily stools which are loose. Often happens after eating salads. No melena rectal bleeding. Denies dysphagia.   Patient states that her sister took protonix and she wants to try that now.   Current Outpatient Prescriptions  Medication Sig Dispense Refill  . acetaminophen (TYLENOL) 500 MG tablet Take 1,000 mg by mouth daily as needed for pain. For pain    . aspirin 81 MG tablet Take 81 mg by mouth daily.    Marland Kitchen atorvastatin (LIPITOR) 10 MG tablet Take 1 tablet (10 mg total) by mouth at bedtime. 90 tablet 0  .  clobetasol cream (TEMOVATE) 0.05 % Apply topically 2 (two) times a week. Light use only 60 g 0  . clonazePAM (KLONOPIN) 0.5 MG tablet TAKE 1/2 TABLET BY MOUTH EVERY DAY AS NEEDED 30 tablet 3  . levocetirizine (XYZAL) 5 MG tablet Take 1 tablet (5 mg total) by mouth every evening. 30 tablet 4  . Multiple Vitamins-Minerals (EQ COMPLETE MULTIVITAMIN-ADULT) TABS Take 1 tablet by mouth every morning.     Marland Kitchen omeprazole (PRILOSEC) 20 MG capsule Take 1 capsule (20 mg total) by mouth daily. 90 capsule 1  . Wheat Dextrin (BENEFIBER) POWD Take 5 mLs by mouth 2 (two) times daily. Mixes in beverage     No current facility-administered medications for this visit.     Allergies as of 05/29/2016 - Review Complete 05/29/2016  Allergen Reaction Noted  . Amoxicillin-pot clavulanate Hives and Itching 04/04/2010  . Carafate [sucralfate] Other (See Comments) 05/21/2012  . Cetirizine hcl Other (See Comments) 02/14/2010  . Mometasone furoate Dermatitis 10/09/2007  . Other  09/20/2011    ROS:  General: Negative for anorexia, weight loss, fever, chills, fatigue, weakness. ENT: Negative for hoarseness, difficulty swallowing , nasal congestion. CV: Negative for chest pain, angina, palpitations, dyspnea on exertion, peripheral edema.  Respiratory: Negative for dyspnea at rest, dyspnea on exertion, cough, sputum, wheezing.  GI: See history of present illness. GU:  Negative for dysuria, hematuria, urinary incontinence, urinary frequency, nocturnal urination.  Endo: Negative for unusual weight change.    Physical Examination:  BP 131/64 (BP Location: Right Arm, Patient Position: Sitting, Cuff Size: Normal)   Pulse 74   Temp 98.2 F (36.8 C) (Oral)   Ht 5\' 3"  (1.6 m)   Wt 108 lb 6.4 oz (49.2 kg)   BMI 19.20 kg/m   General: Well-nourished, well-developed in no acute distress.  Eyes: No icterus. Mouth: Oropharyngeal mucosa moist and pink , no lesions erythema or exudate. Lungs: Clear to auscultation  bilaterally.  Heart: Regular rate and rhythm, no murmurs rubs or gallops.  Abdomen: Bowel sounds are normal, nontender, nondistended, no hepatosplenomegaly or masses, no abdominal bruits or hernia , no rebound or guarding.   Extremities: No lower extremity edema. No clubbing or deformities. Neuro: Alert and oriented x 4   Skin: Warm and dry, no jaundice.   Psych: Alert and cooperative, normal mood and affect.  Labs:  Lab Results  Component Value Date   WBC 4.4 03/15/2016   HGB 10.8 (L) 03/15/2016   HCT 32.6 (L) 03/15/2016   HCT 32.6 (L) 03/15/2016   MCV 92.6 03/15/2016   PLT 343 03/15/2016   Lab Results  Component Value Date   CREATININE 0.69 03/15/2016   BUN 8 03/15/2016   NA 140 03/15/2016   K 4.2 03/15/2016   CL 103 03/15/2016   CO2 27 03/15/2016   Lab Results  Component Value Date   ALT 12 03/15/2016   AST 19 03/15/2016   ALKPHOS 48 03/15/2016   BILITOT 0.6 03/15/2016    Imaging Studies: No results found.

## 2016-05-31 ENCOUNTER — Encounter (HOSPITAL_COMMUNITY): Payer: Self-pay

## 2016-05-31 ENCOUNTER — Encounter (HOSPITAL_COMMUNITY)
Admission: RE | Admit: 2016-05-31 | Discharge: 2016-05-31 | Disposition: A | Discharge status: Home / Self Care | Payer: Medicare Other | Source: Ambulatory Visit | Attending: Gastroenterology | Admitting: Gastroenterology

## 2016-05-31 ENCOUNTER — Telehealth: Payer: Self-pay | Admitting: Internal Medicine

## 2016-05-31 DIAGNOSIS — R1013 Epigastric pain: Secondary | ICD-10-CM | POA: Diagnosis not present

## 2016-05-31 DIAGNOSIS — R1010 Upper abdominal pain, unspecified: Secondary | ICD-10-CM | POA: Diagnosis not present

## 2016-05-31 DIAGNOSIS — R634 Abnormal weight loss: Secondary | ICD-10-CM | POA: Diagnosis not present

## 2016-05-31 MED ORDER — TECHNETIUM TC 99M SULFUR COLLOID
2.0000 | Freq: Once | INTRAVENOUS | Status: AC | PRN
  Administered 2016-05-31: 2 via ORAL

## 2016-05-31 NOTE — Progress Notes (Signed)
Please let patient know that her GES showed her stomach empyies slowly. Very little emptying the first two hours after the meal and still at fours hours was not empty.  Given her age and sensitivity to medication (and risk of reglan) let's continue with new PPI as per OV (pantoprazole), give her written information about gastroparesis, have her eat SMALL meals 5-6 times a day instead of regular size meals. Avoid greasy, heavy foods.   Come back to see rmr in four weeks or call sooner if needed. If no significant improvement on gastroparesis diet and pantoprazole then we may consider if medication is needed.

## 2016-05-31 NOTE — Telephone Encounter (Signed)
Pt's daughter, Ferne Reus, called today to see if patient's results were back from radiology. I told her it's normally 7-10 business days, but once available the nurse would call. She said that her mother is easily confused and to call her with the results. [phone]

## 2016-06-04 ENCOUNTER — Other Ambulatory Visit: Payer: Self-pay | Admitting: Obstetrics and Gynecology

## 2016-06-05 ENCOUNTER — Encounter: Payer: Self-pay | Admitting: Internal Medicine

## 2016-06-05 ENCOUNTER — Telehealth: Payer: Self-pay

## 2016-06-05 NOTE — Telephone Encounter (Signed)
APPOINTMENT MADE AND LETTER SENT °

## 2016-06-05 NOTE — Telephone Encounter (Signed)
See gastric emptying results- gastroparesis information mailed to the pt.  Stacey, please schedule ov with RMR in 4 weeks.

## 2016-06-05 NOTE — Telephone Encounter (Signed)
Ginger spoke with the pt. See result note.

## 2016-06-06 ENCOUNTER — Telehealth: Payer: Self-pay | Admitting: Internal Medicine

## 2016-06-06 NOTE — Telephone Encounter (Signed)
I spoke with the pt. Explained her results to her again. Informed her that I had mailed a gastroparesis diet to her. I went over the steps in the diet. I informed the pt that the only person listed on her "release of information" was [place]. Pt stated it was ok to tell her results to her other daughter- Ferne Reus too.

## 2016-06-06 NOTE — Telephone Encounter (Signed)
Pt's daughter, [name], called to check on patient's radiology results from last week. Please call either [phone] or [phone]

## 2016-06-07 ENCOUNTER — Telehealth: Payer: Self-pay | Admitting: Internal Medicine

## 2016-06-07 NOTE — Telephone Encounter (Signed)
Pt called to give [place] a verbal consent to speak with her daughter, Ferne Reus about her medical care and results. She did not put [name] on the release of information form the day she was in the office. She only put [place]. I told her the next time she is in the office to complete another form and add [name] as well. She wants the nurse to call Ferne Reus at [phone] today

## 2016-06-07 NOTE — Telephone Encounter (Signed)
I spoke with the pt's daughter

## 2016-06-20 ENCOUNTER — Other Ambulatory Visit: Payer: Self-pay

## 2016-06-21 ENCOUNTER — Ambulatory Visit: Payer: Medicare Other | Admitting: Gastroenterology

## 2016-06-28 NOTE — Progress Notes (Signed)
Noted. Other recommendations as outlined including keeping office visit with Dr. Gala Romney this month.

## 2016-07-10 ENCOUNTER — Encounter: Payer: Self-pay | Admitting: Internal Medicine

## 2016-07-10 ENCOUNTER — Ambulatory Visit (INDEPENDENT_AMBULATORY_CARE_PROVIDER_SITE_OTHER): Payer: Medicare Other | Admitting: Internal Medicine

## 2016-07-10 VITALS — BP 148/63 | HR 68 | Temp 97.8°F | Ht 62.0 in | Wt 109.0 lb

## 2016-07-10 DIAGNOSIS — K3184 Gastroparesis: Secondary | ICD-10-CM | POA: Diagnosis not present

## 2016-07-10 DIAGNOSIS — K219 Gastro-esophageal reflux disease without esophagitis: Secondary | ICD-10-CM | POA: Diagnosis not present

## 2016-07-10 NOTE — Patient Instructions (Signed)
Continue prilosec daily - as discussed benefits outweigh the risks  Gastroparesis (lazy stomach) diet as discussed  Office visit with [place] in 1 year
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
## 2016-07-17 ENCOUNTER — Ambulatory Visit (INDEPENDENT_AMBULATORY_CARE_PROVIDER_SITE_OTHER): Payer: Medicare Other

## 2016-07-17 DIAGNOSIS — Z23 Encounter for immunization: Secondary | ICD-10-CM | POA: Diagnosis not present

## 2016-07-23 ENCOUNTER — Other Ambulatory Visit: Payer: Self-pay | Admitting: Family Medicine

## 2016-08-20 ENCOUNTER — Ambulatory Visit (INDEPENDENT_AMBULATORY_CARE_PROVIDER_SITE_OTHER): Payer: Medicare Other

## 2016-08-20 VITALS — BP 134/66 | HR 76 | Resp 18 | Ht 62.0 in | Wt 110.0 lb

## 2016-08-20 DIAGNOSIS — Z0000 Encounter for general adult medical examination without abnormal findings: Secondary | ICD-10-CM | POA: Diagnosis not present

## 2016-08-20 NOTE — Progress Notes (Signed)
Subjective:    Kara [name] is a 82 y.o. female who presents for Medicare Annual/Subsequent preventive examination.  Preventive Screening-Counseling & Management  Tobacco History  Smoking Status  . Never Smoker  Smokeless Tobacco  . Current User  . Types: Snuff    Comment: snuff can every 2 wks for 65+ yrs      Current Problems (verified) Patient Active Problem List   Diagnosis Date Noted  . Early satiety 05/29/2016  . Pain of upper abdomen 04/14/2016  . Allergic rhinitis 10/28/2015  . Schatzki's ring   . Hiatal hernia   . Oropharyngeal dysphagia 02/03/2015  . Back pain without radiation 01/15/2014  . Thyroid cyst 12/08/2013  . Cystocele, midline 09/22/2013  . IGT (impaired glucose tolerance) 07/26/2013  . Shoulder pain, left 10/10/2012  . IBS (irritable bowel syndrome) 07/01/2012  . Arthritis 08/26/2011  . Tubular adenoma of colon 06/06/2011  . Diverticulosis 03/29/2011  . GERD (gastroesophageal reflux disease) 03/29/2011  . GENERALIZED ANXIETY DISORDER 08/30/2010  . Dyspepsia and disorder of function of stomach 08/30/2010  . Seasonal allergic rhinitis 01/25/2010  . NECK PAIN 03/02/2009  . UTERINE PROLAPSE 08/11/2008  . TMJ SYNDROME 07/25/2008  . Hyperlipidemia LDL goal <100 01/15/2008  . Senile osteoporosis 01/15/2008    Medications Prior to Visit Current Outpatient Prescriptions on File Prior to Visit  Medication Sig Dispense Refill  . acetaminophen (TYLENOL) 500 MG tablet Take 1,000 mg by mouth daily as needed for pain. For pain    . aspirin 81 MG tablet Take 81 mg by mouth daily.    Marland Kitchen atorvastatin (LIPITOR) 10 MG tablet TAKE 1 TABLET (10 MG TOTAL) BY MOUTH AT BEDTIME. 90 tablet 1  . clobetasol cream (TEMOVATE) 0.05 % APPLY TOPICALLY 2 TIMES A WEEK. LIGHT USE ONLY 60 g 0  . clonazePAM (KLONOPIN) 0.5 MG tablet TAKE 1/2 TABLET BY MOUTH EVERY DAY AS NEEDED 30 tablet 3  . levocetirizine (XYZAL) 5 MG tablet Take 1 tablet (5 mg total) by mouth every evening. 30  tablet 4  . Multiple Vitamins-Minerals (EQ COMPLETE MULTIVITAMIN-ADULT) TABS Take 1 tablet by mouth every morning.     . pantoprazole (PROTONIX) 40 MG tablet Take 1 tablet (40 mg total) by mouth daily before breakfast. 90 tablet 3  . Wheat Dextrin (BENEFIBER) POWD Take 5 mLs by mouth 2 (two) times daily. Mixes in beverage     No current facility-administered medications on file prior to visit.     Current Medications (verified) Current Outpatient Prescriptions  Medication Sig Dispense Refill  . acetaminophen (TYLENOL) 500 MG tablet Take 1,000 mg by mouth daily as needed for pain. For pain    . aspirin 81 MG tablet Take 81 mg by mouth daily.    Marland Kitchen atorvastatin (LIPITOR) 10 MG tablet TAKE 1 TABLET (10 MG TOTAL) BY MOUTH AT BEDTIME. 90 tablet 1  . clobetasol cream (TEMOVATE) 0.05 % APPLY TOPICALLY 2 TIMES A WEEK. LIGHT USE ONLY 60 g 0  . clonazePAM (KLONOPIN) 0.5 MG tablet TAKE 1/2 TABLET BY MOUTH EVERY DAY AS NEEDED 30 tablet 3  . levocetirizine (XYZAL) 5 MG tablet Take 1 tablet (5 mg total) by mouth every evening. 30 tablet 4  . Multiple Vitamins-Minerals (EQ COMPLETE MULTIVITAMIN-ADULT) TABS Take 1 tablet by mouth every morning.     . pantoprazole (PROTONIX) 40 MG tablet Take 1 tablet (40 mg total) by mouth daily before breakfast. 90 tablet 3  . Wheat Dextrin (BENEFIBER) POWD Take 5 mLs by mouth 2 (two) times daily.  Mixes in beverage     No current facility-administered medications for this visit.      Allergies (verified) Amoxicillin-pot clavulanate; Carafate [sucralfate]; Cetirizine hcl; Mometasone furoate; and Other   PAST HISTORY  Family History Family History  Problem Relation Age of Onset  . Hypertension Mother   . Stroke Mother   . Hypertension Father   . Heart disease Father   . Cancer Sister     lung  . Colon cancer Neg Hx     Social History Social History  Substance Use Topics  . Smoking status: Never Smoker  . Smokeless tobacco: Current User    Types: Snuff      Comment: snuff can every 2 wks for 65+ yrs  . Alcohol use No     Are there smokers in your home (other than you)? No  Risk Factors Current exercise habits: The patient does not participate in regular exercise at present.  Dietary issues discussed: Upcoming changes with food labels    Cardiac risk factors: advanced age (older than 80 for men, 4 for women) and dyslipidemia.  Depression Screen (Note: if answer to either of the following is "Yes", a more complete depression screening is indicated)   Over the past two weeks, have you felt down, depressed or hopeless? No  Over the past two weeks, have you felt little interest or pleasure in doing things? No  Have you lost interest or pleasure in daily life? No  Do you often feel hopeless? No  Do you cry easily over simple problems? No  Activities of Daily Living In your present state of health, do you have any difficulty performing the following activities?:  Driving? Yes Managing money?  No Feeding yourself? No Getting from bed to chair? No  Climbing a flight of stairs? No Preparing food and eating?: No Bathing or showering? No Getting dressed: No Getting to the toilet? No Using the toilet:No Moving around from place to place: No In the past year have you fallen or had a near fall?:No   Are you sexually active?  No  Do you have more than one partner?  na  Hearing Difficulties: Yes Do you often ask people to speak up or repeat themselves? Yes Do you experience ringing or noises in your ears? No Do you have difficulty understanding soft or whispered voices? Yes   Do you feel that you have a problem with memory? Yes  Do you often misplace items? Yes  Do you feel safe at home?  Yes  Cognitive Testing  Alert? Yes  Normal Appearance?Yes  Oriented to person? Yes  Place? Yes   Time? Yes  Recall of three objects?  Yes  Can perform simple calculations? Yes  Displays appropriate judgment?Yes  Can read the correct time from a  watch face?Yes   Advanced Directives have been discussed with the patient? Yes  List the Names of Other Physician/Practitioners you currently use: 1.  Dr. Gala Romney (GI)   Indicate any recent Medical Services you may have received from other than [facility] providers in the past year (date may be approximate).  Immunization History  Administered Date(s) Administered  . Influenza Split 08/14/2011, 07/28/2012  . Influenza Whole 07/24/2007, 07/20/2008, 08/09/2009, 07/26/2010  . Influenza,inj,Quad PF,36+ Mos 07/22/2013, 07/28/2014, 07/21/2015, 07/17/2016  . Pneumococcal Conjugate-13 01/26/2015  . Pneumococcal Polysaccharide-23 08/30/2004  . Td 04/20/2003  . Tdap 03/24/2014    Screening Tests Health Maintenance  Topic Date Due  . ZOSTAVAX  05/09/2017 (Originally 07/21/1989)  . TETANUS/TDAP  03/24/2024  . INFLUENZA VACCINE  Completed  . DEXA SCAN  Completed  . PNA vac Low Risk Adult  Completed    All answers were reviewed with the patient and necessary referrals were made:  Vanetta Mulders, LPN   [id number]   History reviewed: allergies, current medications, past family history, past medical history, past social history, past surgical history and problem list  Review of Systems A comprehensive review of systems was negative.    Objective:     Vision by Snellen chart: right eye:20/30, left eye:20/30  Body mass index is 20.12 kg/m. BP 134/66   Pulse 76   Resp 18   Ht 5\' 2"  (1.575 m)   Wt 110 lb (49.9 kg)   SpO2 98%   BMI 20.12 kg/m   No exam performed today, annual wellness without physical exam.     Assessment:  Medicare annual wellness visit, subsequent Annual exam as documented. Counseling done  re healthy lifestyle involving commitment to 150 minutes exercise per week, heart healthy diet, and attaining healthy weight.The importance of adequate sleep also discussed. Regular seat belt use and home safety, is also discussed. Changes in health habits are decided  on by the patient with goals and time frames  set for achieving them. Immunization and cancer screening needs are specifically addressed at this visit.         Plan:     During the course of the visit the patient was educated and counseled about appropriate screening and preventive services including:    Nutrition counseling   Smoking cessation counseling  Diet review for nutrition referral? Yes ____  Not Indicated _x___   Patient Instructions (the written plan) was given to the patient.  Medicare Attestation I have personally reviewed: The patient's medical and social history Their use of alcohol, tobacco or illicit drugs Their current medications and supplements The patient's functional ability including ADLs,fall risks, home safety risks, cognitive, and hearing and visual impairment Diet and physical activities Evidence for depression or mood disorders  The patient's weight, height, BMI, and visual acuity have been recorded in the chart.  I have made referrals, counseling, and provided education to the patient based on review of the above and I have provided the patient with a written personalized care plan for preventive services.     Denman George [place], [place]   [id number]

## 2016-08-20 NOTE — Patient Instructions (Signed)
Thank you for choosing [facility] for your health care needs  The Annual Wellness Visit is designed to allow [place] the chance to assist you in preserving and improving you health.   Dr. Moshe Cipro will see you back in February  Your lab order will be mailed to you with the date to have it drawn

## 2016-08-28 NOTE — Assessment & Plan Note (Signed)

## 2016-09-01 ENCOUNTER — Telehealth: Payer: Self-pay

## 2016-09-01 DIAGNOSIS — E041 Nontoxic single thyroid nodule: Secondary | ICD-10-CM

## 2016-09-01 DIAGNOSIS — E785 Hyperlipidemia, unspecified: Secondary | ICD-10-CM

## 2016-09-01 DIAGNOSIS — R7301 Impaired fasting glucose: Secondary | ICD-10-CM

## 2016-09-01 NOTE — Telephone Encounter (Signed)
Labs ordered and mailed to patient.  

## 2016-09-28 ENCOUNTER — Ambulatory Visit (INDEPENDENT_AMBULATORY_CARE_PROVIDER_SITE_OTHER): Payer: Medicare Other | Admitting: Family Medicine

## 2016-09-28 ENCOUNTER — Encounter: Payer: Self-pay | Admitting: Family Medicine

## 2016-09-28 VITALS — BP 136/62 | HR 82 | Temp 98.0°F | Resp 18 | Ht 62.0 in | Wt 111.0 lb

## 2016-09-28 DIAGNOSIS — N952 Postmenopausal atrophic vaginitis: Secondary | ICD-10-CM

## 2016-09-28 DIAGNOSIS — M19012 Primary osteoarthritis, left shoulder: Secondary | ICD-10-CM | POA: Diagnosis not present

## 2016-09-28 DIAGNOSIS — R350 Frequency of micturition: Secondary | ICD-10-CM

## 2016-09-28 DIAGNOSIS — M19011 Primary osteoarthritis, right shoulder: Secondary | ICD-10-CM | POA: Diagnosis not present

## 2016-09-28 MED ORDER — TRAMADOL HCL 50 MG PO TABS: 50.0000 mg | ORAL_TABLET | Freq: Three times a day (TID) | ORAL | 0 refills | Status: DC | PRN

## 2016-09-28 NOTE — Progress Notes (Signed)
Chief Complaint  Patient presents with  . Vaginal Pain    dryness  Needs advice on use of the clobetasol prescribed for her atrophic vaginitis/lichen sclerosis.  The box says 2x a week and the package insert says 2x a day.  She is having increased vaginal itching and discomfort the last several days and had a "sore" on her external genitalia she wants checked.  No drainage or discharge.  Wears a pad and has some moisture.  Refuses to use the pessary recommended and has prolapse.    Patient Active Problem List   Diagnosis Date Noted  . Early satiety 05/29/2016  . Allergic rhinitis 10/28/2015  . Medicare annual wellness visit, subsequent 05/03/2015  . Schatzki's ring   . Hiatal hernia   . Oropharyngeal dysphagia 02/03/2015  . Back pain without radiation 01/15/2014  . Thyroid cyst 12/08/2013  . Cystocele, midline 09/22/2013  . IGT (impaired glucose tolerance) 07/26/2013  . Shoulder pain, left 10/10/2012  . IBS (irritable bowel syndrome) 07/01/2012  . Arthritis 08/26/2011  . Tubular adenoma of colon 06/06/2011  . Diverticulosis 03/29/2011  . GERD (gastroesophageal reflux disease) 03/29/2011  . GENERALIZED ANXIETY DISORDER 08/30/2010  . Seasonal allergic rhinitis 01/25/2010  . NECK PAIN 03/02/2009  . UTERINE PROLAPSE 08/11/2008  . Hyperlipidemia LDL goal <100 01/15/2008  . Senile osteoporosis 01/15/2008    Outpatient Encounter Prescriptions as of 09/28/2016  Medication Sig  . acetaminophen (TYLENOL) 500 MG tablet Take 1,000 mg by mouth daily as needed for pain. For pain  . aspirin 81 MG tablet Take 81 mg by mouth daily.  Marland Kitchen atorvastatin (LIPITOR) 10 MG tablet TAKE 1 TABLET (10 MG TOTAL) BY MOUTH AT BEDTIME.  . clobetasol cream (TEMOVATE) 0.05 % APPLY TOPICALLY 2 TIMES A WEEK. LIGHT USE ONLY  . clonazePAM (KLONOPIN) 0.5 MG tablet TAKE 1/2 TABLET BY MOUTH EVERY DAY AS NEEDED  . levocetirizine (XYZAL) 5 MG tablet Take 1 tablet (5 mg total) by mouth every evening.  . Multiple  Vitamins-Minerals (EQ COMPLETE MULTIVITAMIN-ADULT) TABS Take 1 tablet by mouth every morning.   . pantoprazole (PROTONIX) 40 MG tablet Take 1 tablet (40 mg total) by mouth daily before breakfast.  . Wheat Dextrin (BENEFIBER) POWD Take 5 mLs by mouth 2 (two) times daily. Mixes in beverage   No facility-administered encounter medications on file as of 09/28/2016.     Allergies  Allergen Reactions  . Tramadol Other (See Comments)    hallucinate  . Amoxicillin-Pot Clavulanate Hives and Itching  . Carafate [Sucralfate] Other (See Comments)    Tingly sensation arms, neck, scalp.   . Cetirizine Hcl Other (See Comments)    REACTION: too strong for her  . Mometasone Furoate Dermatitis    GENERIC NAME FOR NASONEX**  . Other     ALLERGY TO GI COCKTAIL    Review of Systems  Constitutional: Negative for activity change, appetite change, chills, fever and unexpected weight change.  Gastrointestinal: Negative for abdominal distention, abdominal pain, constipation and diarrhea.  Genitourinary: Positive for frequency and genital sores. Negative for difficulty urinating, vaginal bleeding, vaginal discharge and vaginal pain.  Musculoskeletal: Positive for arthralgias.       Arthritis shoulders  Neurological: Negative for dizziness and weakness.  Psychiatric/Behavioral: Negative for sleep disturbance. The patient is not nervous/anxious.     BP 136/62 (BP Location: Left Arm, Patient Position: Sitting, Cuff Size: Normal)   Pulse 82   Temp 98 F (36.7 C) (Oral)   Resp 18   Ht 5\' 2"  (  1.575 m)   Wt 111 lb (50.3 kg)   SpO2 98%   BMI 20.30 kg/m   Physical Exam  Constitutional: She is oriented to person, place, and time. She appears well-developed and well-nourished.  Small , frail appearing woman  Cardiovascular: Normal rate, regular rhythm and normal heart sounds.   Pulmonary/Chest: Effort normal and breath sounds normal.  Abdominal: Soft. Bowel sounds are normal. There is no tenderness.    Genitourinary:     Lymphadenopathy:       Right: No inguinal adenopathy present.       Left: No inguinal adenopathy present.  Neurological: She is alert and oriented to person, place, and time.  Psychiatric: She has a normal mood and affect. Her behavior is normal.  Nervous     ASSESSMENT/PLAN:  1. Atrophic vaginitis May increase the clobetasol temporarily, then decrease to as needed  2. Primary osteoarthritis of both shoulders Patient inquired what she can take for shoulder pain with her stomach problems.  Acetaminophen advised  3. Frequency of urination  - Urine culture   Patient Instructions  You may use the clobetasol ointment 5 days a week for 4 weeks, then reduce and use 2-3 times a week as needed to control itching and symptoms  There are vaginal moisturizers available over the counter ( replens) that you can use every day  Take acetaminophen for arthritis pain, you may take 1000 mg three times a day If this is not strong enough, call the office  See Dr Moshe Cipro in follow up   Raylene Everts, MD

## 2016-09-28 NOTE — Patient Instructions (Addendum)
You may use the clobetasol ointment 5 days a week for 4 weeks, then reduce and use 2-3 times a week as needed to control itching and symptoms  There are vaginal moisturizers available over the counter ( replens) that you can use every day  Take acetaminophen for arthritis pain, you may take 1000 mg three times a day If this is not strong enough, call the office  See Dr Moshe Cipro in follow up

## 2016-10-02 ENCOUNTER — Telehealth: Payer: Self-pay | Admitting: Family Medicine

## 2016-10-02 ENCOUNTER — Telehealth: Payer: Self-pay

## 2016-10-02 NOTE — Telephone Encounter (Signed)
pts daughter wants someone to call her back and discuss what Friday's visit was about. She feels her mother might need to go see a specialist.   She didn't want to have to bring her up here in the morning if she didn't have to.

## 2016-10-02 NOTE — Telephone Encounter (Signed)
Called pt left a message

## 2016-10-02 NOTE — Telephone Encounter (Signed)
Spoke to [employer] daughter sharon. She states her mother has blisters on her peri area and the cream that was rx'd is not helping. States she has an appt here tomorrow, but if we are unable to help her here then we should give her a referral to a urologist. I do not see a signed release to speak to sharon.

## 2016-10-03 ENCOUNTER — Ambulatory Visit: Payer: Medicare Other | Admitting: Family Medicine

## 2016-10-03 ENCOUNTER — Telehealth: Payer: Self-pay | Admitting: Family Medicine

## 2016-10-03 NOTE — Telephone Encounter (Signed)
Patient canceled OV

## 2016-10-03 NOTE — Telephone Encounter (Signed)
Spoke with mrs. Gura. See previous note

## 2016-10-03 NOTE — Telephone Encounter (Signed)
Dr. Meda Coffee treated Ms Kara [name] [facility] for a vaginal rash and was told to go to the pharmacy to pick up a moisturizing cream and Ms. Sheer states that it hasnt helped much at all and she is asking if there is something else that she could use or do that would help, please advise?

## 2016-10-03 NOTE — Telephone Encounter (Signed)
Patient aware and will [facility] to see if she can get appt with jennifer instead

## 2016-10-03 NOTE — Telephone Encounter (Signed)
Ms Gangi had a prescription for clobetasol that she was instructed to increase.  The other advice was for a vaginal moisturizer for dryness.  This is a longstanding problem , and she has seen GYN for this.  She needs to go back to gynecologist if her problem persists.  If she didn't like the female doctor she saw last time we can get her win with Cameron Proud.  [facility]

## 2016-10-10 ENCOUNTER — Encounter: Payer: Medicare Other | Admitting: Adult Health

## 2016-10-16 ENCOUNTER — Other Ambulatory Visit: Payer: Self-pay | Admitting: Family Medicine

## 2016-10-17 ENCOUNTER — Other Ambulatory Visit: Payer: Self-pay | Admitting: Family Medicine

## 2016-10-23 ENCOUNTER — Other Ambulatory Visit: Payer: Self-pay | Admitting: Internal Medicine

## 2016-10-23 ENCOUNTER — Telehealth: Payer: Self-pay

## 2016-10-23 NOTE — Telephone Encounter (Signed)
Patient given appt for 1/3 am.  States that she may go to the ED because she is unsure if she is dehydrated.     Advised patient to push fluids as much as she can and rest.

## 2016-10-24 ENCOUNTER — Ambulatory Visit: Payer: Medicare Other | Admitting: Family Medicine

## 2016-10-29 ENCOUNTER — Encounter: Payer: Self-pay | Admitting: Family Medicine

## 2016-10-29 ENCOUNTER — Ambulatory Visit (INDEPENDENT_AMBULATORY_CARE_PROVIDER_SITE_OTHER): Payer: Medicare Other | Admitting: Family Medicine

## 2016-10-29 VITALS — BP 138/62 | HR 76 | Temp 98.4°F | Resp 18 | Ht 62.0 in | Wt 108.0 lb

## 2016-10-29 DIAGNOSIS — J00 Acute nasopharyngitis [common cold]: Secondary | ICD-10-CM

## 2016-10-29 MED ORDER — BENZONATATE 100 MG PO CAPS: 100.0000 mg | ORAL_CAPSULE | Freq: Two times a day (BID) | ORAL | 0 refills | Status: DC | PRN

## 2016-10-29 NOTE — Patient Instructions (Signed)
Drink plenty of water Salt water gargles Run a humidifier if you have one Tessalon for cough Call if not better by end of week

## 2016-10-29 NOTE — Progress Notes (Signed)
Subjective:     Kara [name] is a 83 y.o. female who presents for evaluation of symptoms of a URI. Symptoms include achiness, congestion, cough described as intermittent, non productive cough and scratchy throat. Onset of symptoms was 7 days ago, and has been gradually improving since that time. Treatment to date: none.  The following portions of the patient's history were reviewed and updated as appropriate: past family history, past medical history, past social history and past surgical history.  Review of Systems Constitutional: positive for fatigue and malaise Eyes: negative for irritation and redness Ears, nose, mouth, throat, and face: positive for hoarseness and nasal congestion Respiratory: negative Cardiovascular: negative for chest pain Gastrointestinal: negative for abdominal pain and change in bowel habits Genitourinary:negative Hematologic/lymphatic: negative Allergic/Immunologic: negative for hay fever   Objective:    BP 138/62 (BP Location: Right Arm, Patient Position: Sitting, Cuff Size: Normal)   Pulse 76   Temp 98.4 F (36.9 C) (Oral)   Resp 18   Ht 5\' 2"  (1.575 m)   Wt 108 lb (49 kg)   SpO2 98%   BMI 19.75 kg/m  General appearance: alert, cooperative, appears stated age and fatigued Head: Normocephalic, without obvious abnormality, atraumatic Eyes: negative findings: conjunctivae and sclerae normal and corneas clear Ears: normal TM's and external ear canals both ears Nose: Nares normal. Septum midline. Mucosa normal. No drainage or sinus tenderness., no congestion Throat: abnormal findings: mild oropharyngeal erythema Neck: no adenopathy and thyroid not enlarged, symmetric, no tenderness/mass/nodules Back: symmetric, no curvature. ROM normal. No CVA tenderness. Lungs: clear to auscultation bilaterally Heart: regular rate and rhythm Abdomen: normal findings: soft, non-tender Extremities: extremities normal, atraumatic, no cyanosis or edema   Assessment:     viral upper respiratory illness   Plan:    Discussed diagnosis and treatment of URI. Discussed the importance of avoiding unnecessary antibiotic therapy. Suggested symptomatic OTC remedies. Nasal saline spray for congestion. Follow up as needed.

## 2016-11-26 DIAGNOSIS — R7301 Impaired fasting glucose: Secondary | ICD-10-CM | POA: Diagnosis not present

## 2016-11-26 DIAGNOSIS — E041 Nontoxic single thyroid nodule: Secondary | ICD-10-CM | POA: Diagnosis not present

## 2016-11-26 DIAGNOSIS — E785 Hyperlipidemia, unspecified: Secondary | ICD-10-CM | POA: Diagnosis not present

## 2016-11-27 LAB — HEPATIC FUNCTION PANEL
ALT: 10 U/L (ref 6–29)
AST: 20 U/L (ref 10–35)
Albumin: 3.7 g/dL (ref 3.6–5.1)
Alkaline Phosphatase: 44 U/L (ref 33–130)
BILIRUBIN DIRECT: 0.1 mg/dL (ref <=0.2)
BILIRUBIN INDIRECT: 0.4 mg/dL (ref 0.2–1.2)
TOTAL PROTEIN: 6.4 g/dL (ref 6.1–8.1)
Total Bilirubin: 0.5 mg/dL (ref 0.2–1.2)

## 2016-11-27 LAB — LIPID PANEL
CHOL/HDL RATIO: 2.4 ratio (ref <5.0)
Cholesterol: 150 mg/dL (ref <200)
HDL: 63 mg/dL (ref >50)
LDL Cholesterol: 64 mg/dL (ref <100)
Triglycerides: 117 mg/dL (ref <150)
VLDL: 23 mg/dL (ref <30)

## 2016-11-27 LAB — HEMOGLOBIN A1C
HEMOGLOBIN A1C: 5.5 % (ref <5.7)
MEAN PLASMA GLUCOSE: 111 mg/dL

## 2016-11-27 LAB — TSH: TSH: 3.86 m[IU]/L

## 2016-12-11 DIAGNOSIS — Z961 Presence of intraocular lens: Secondary | ICD-10-CM | POA: Diagnosis not present

## 2016-12-11 DIAGNOSIS — H43393 Other vitreous opacities, bilateral: Secondary | ICD-10-CM | POA: Insufficient documentation

## 2016-12-11 DIAGNOSIS — H353131 Nonexudative age-related macular degeneration, bilateral, early dry stage: Secondary | ICD-10-CM | POA: Diagnosis not present

## 2016-12-11 HISTORY — DX: Presence of intraocular lens: Z96.1

## 2016-12-11 HISTORY — DX: Other vitreous opacities, bilateral: H43.393

## 2016-12-11 HISTORY — DX: Nonexudative age-related macular degeneration, bilateral, early dry stage: H35.3131

## 2016-12-17 ENCOUNTER — Encounter: Payer: Self-pay | Admitting: Family Medicine

## 2016-12-17 ENCOUNTER — Ambulatory Visit (INDEPENDENT_AMBULATORY_CARE_PROVIDER_SITE_OTHER): Payer: Medicare Other | Admitting: Family Medicine

## 2016-12-17 VITALS — BP 120/62 | HR 68 | Resp 15 | Ht 62.0 in | Wt 107.0 lb

## 2016-12-17 DIAGNOSIS — N814 Uterovaginal prolapse, unspecified: Secondary | ICD-10-CM

## 2016-12-17 DIAGNOSIS — R2681 Unsteadiness on feet: Secondary | ICD-10-CM

## 2016-12-17 DIAGNOSIS — Z1211 Encounter for screening for malignant neoplasm of colon: Secondary | ICD-10-CM | POA: Diagnosis not present

## 2016-12-17 DIAGNOSIS — N8111 Cystocele, midline: Secondary | ICD-10-CM

## 2016-12-17 DIAGNOSIS — M542 Cervicalgia: Secondary | ICD-10-CM

## 2016-12-17 DIAGNOSIS — E785 Hyperlipidemia, unspecified: Secondary | ICD-10-CM

## 2016-12-17 NOTE — Assessment & Plan Note (Signed)
Controlled, no change in medication Hyperlipidemia:Low fat diet discussed and encouraged.   Lipid Panel  Lab Results  Component Value Date   CHOL 150 11/26/2016   HDL 63 11/26/2016   LDLCALC 64 11/26/2016   TRIG 117 11/26/2016   CHOLHDL 2.4 11/26/2016

## 2016-12-17 NOTE — Assessment & Plan Note (Signed)
Increased neck and upper extremity pain and stiffness. Will benefit from psychotherapy. Referred for in-home physical therapy.

## 2016-12-17 NOTE — Patient Instructions (Signed)
F/u early September, call if you need me before  Your labs are excellent  You have no hidden blood in stool  You are being referred to Dr Ricka Burdock are being referred for in home pT for chronic neck pain and unsteady gait with recurrent near falls  Thank you  for choosing [facility]. We consider it a privelige to serve you.  Delivering excellent health care in a caring and  compassionate way is our goal.  Partnering with you,  so that together we can achieve this goal is our strategy.

## 2016-12-17 NOTE — Assessment & Plan Note (Signed)
Increasingly symptomatic, reports vulval swelling and  Ulceration refer to gyne for management

## 2016-12-17 NOTE — Assessment & Plan Note (Signed)
Reports recurrent episodes of near falls due to unsteady gait. Has established arthritis in the spine. Will benefit from physical therapy she is referred for this in her home.

## 2016-12-17 NOTE — Progress Notes (Signed)
   Kara [name]     MRN: [id number]      DOB: 1927/04/16   HPI [name] is here for follow up and re-evaluation of chronic medical conditions, medication management and review of any available recent lab and radiology data.  Preventive health is updated, specifically  Cancer screening and Immunization.   Questions or concerns regarding consultations or procedures which the PT has had in the interim are  addressed. The PT denies any adverse reactions to current medications since the last visit.  Patient complains of  both uterine  and bladder prolapse. She reports skin irritation and ulceration and swelling in the vulva.  She requests evaluation by new gynecologist and  will therefore be referred per her request outside of the previous office she was seen in states that students in training examined her and she was uncomfortable  Patient also complains of increased and uncontrolled neck and shoulder pain with limitation in movement as well as repeated near falls in her home. She is very interested in getting physical therapy this has helped in the past however transportation to appointments is a problem and she is being referred to in-home physical therapy.  ROS Denies recent fever or chills. Denies sinus pressure, nasal congestion, ear pain or sore throat. Denies chest congestion, productive cough or wheezing. Denies chest pains, palpitations and leg swelling Denies abdominal pain, nausea, vomiting,diarrhea or constipation.   Denies dysuria, frequency, hesitancy does have  incontinence. ity. Denies headaches, seizures, numbness, or tingling. Denies depression, anxiety or insomnia. Denies skin break down or rash.   PE  BP 120/62   Pulse 68   Resp 15   Ht 5\' 2"  (1.575 m)   Wt 107 lb (48.5 kg)   SpO2 99%   BMI 19.57 kg/m   Patient alert and oriented and in no cardiopulmonary distress.  HEENT: No facial asymmetry, EOMI,   oropharynx pink and moist.  Neck decreased ROM no JVD,  no mass.  Chest: Clear to auscultation bilaterally.  CVS: S1, S2 no murmurs, no S3.Regular rate.  ABD: Soft non tender. No organomegaly or mass, normal BS Rectal : no mass heme negative stool  Ext: No edema  MS: decreased  ROM spine, shoulders, hips and knees.  Skin: Intact, no ulcerations or rash noted.  Psych: Good eye contact, normal affect. Memory intact not anxious or depressed appearing.  CNS: CN 2-12 intact, power,  normal throughout.no focal deficits noted.   Assessment & Plan Uterine prolapse Increasingly symptomatic, reports vulval swelling and  Ulceration refer to gyne for management  Cystocele, midline Increasingly symptomatic, with incontinence and vulval irritation refer gyne  Neck pain, bilateral Increased neck and upper extremity pain and stiffness. Will benefit from psychotherapy. Referred for in-home physical therapy.  Unsteady gait Reports recurrent episodes of near falls due to unsteady gait. Has established arthritis in the spine. Will benefit from physical therapy she is referred for this in her home.  Hyperlipidemia LDL goal <100 Controlled, no change in medication Hyperlipidemia:Low fat diet discussed and encouraged.   Lipid Panel  Lab Results  Component Value Date   CHOL 150 11/26/2016   HDL 63 11/26/2016   LDLCALC 64 11/26/2016   TRIG 117 11/26/2016   CHOLHDL 2.4 11/26/2016

## 2016-12-17 NOTE — Assessment & Plan Note (Signed)
Increasingly symptomatic, with incontinence and vulval irritation refer gyne

## 2016-12-18 LAB — POC HEMOCCULT BLD/STL (OFFICE/1-CARD/DIAGNOSTIC): FECAL OCCULT BLD: NEGATIVE

## 2016-12-18 NOTE — Addendum Note (Signed)
Addended by: Eual Fines on: 12/18/2016 08:24 AM   Modules accepted: Orders

## 2016-12-19 DIAGNOSIS — D126 Benign neoplasm of colon, unspecified: Secondary | ICD-10-CM | POA: Diagnosis not present

## 2016-12-19 DIAGNOSIS — K219 Gastro-esophageal reflux disease without esophagitis: Secondary | ICD-10-CM | POA: Diagnosis not present

## 2016-12-19 DIAGNOSIS — M810 Age-related osteoporosis without current pathological fracture: Secondary | ICD-10-CM | POA: Diagnosis not present

## 2016-12-19 DIAGNOSIS — K589 Irritable bowel syndrome without diarrhea: Secondary | ICD-10-CM | POA: Diagnosis not present

## 2016-12-19 DIAGNOSIS — E785 Hyperlipidemia, unspecified: Secondary | ICD-10-CM | POA: Diagnosis not present

## 2016-12-19 DIAGNOSIS — F411 Generalized anxiety disorder: Secondary | ICD-10-CM | POA: Diagnosis not present

## 2016-12-19 DIAGNOSIS — M542 Cervicalgia: Secondary | ICD-10-CM | POA: Diagnosis not present

## 2016-12-19 DIAGNOSIS — Z9181 History of falling: Secondary | ICD-10-CM | POA: Diagnosis not present

## 2016-12-19 DIAGNOSIS — M1991 Primary osteoarthritis, unspecified site: Secondary | ICD-10-CM | POA: Diagnosis not present

## 2016-12-19 DIAGNOSIS — R296 Repeated falls: Secondary | ICD-10-CM | POA: Diagnosis not present

## 2016-12-19 DIAGNOSIS — G8929 Other chronic pain: Secondary | ICD-10-CM | POA: Diagnosis not present

## 2016-12-20 DIAGNOSIS — X32XXXD Exposure to sunlight, subsequent encounter: Secondary | ICD-10-CM | POA: Diagnosis not present

## 2016-12-20 DIAGNOSIS — L570 Actinic keratosis: Secondary | ICD-10-CM | POA: Diagnosis not present

## 2016-12-20 DIAGNOSIS — L0202 Furuncle of face: Secondary | ICD-10-CM | POA: Diagnosis not present

## 2016-12-20 DIAGNOSIS — L821 Other seborrheic keratosis: Secondary | ICD-10-CM | POA: Diagnosis not present

## 2016-12-27 DIAGNOSIS — F411 Generalized anxiety disorder: Secondary | ICD-10-CM | POA: Diagnosis not present

## 2016-12-27 DIAGNOSIS — R296 Repeated falls: Secondary | ICD-10-CM | POA: Diagnosis not present

## 2016-12-27 DIAGNOSIS — M542 Cervicalgia: Secondary | ICD-10-CM | POA: Diagnosis not present

## 2016-12-27 DIAGNOSIS — M1991 Primary osteoarthritis, unspecified site: Secondary | ICD-10-CM | POA: Diagnosis not present

## 2016-12-27 DIAGNOSIS — M810 Age-related osteoporosis without current pathological fracture: Secondary | ICD-10-CM | POA: Diagnosis not present

## 2016-12-27 DIAGNOSIS — G8929 Other chronic pain: Secondary | ICD-10-CM | POA: Diagnosis not present

## 2016-12-31 DIAGNOSIS — E785 Hyperlipidemia, unspecified: Secondary | ICD-10-CM | POA: Diagnosis not present

## 2016-12-31 DIAGNOSIS — Z9181 History of falling: Secondary | ICD-10-CM | POA: Diagnosis not present

## 2016-12-31 DIAGNOSIS — R296 Repeated falls: Secondary | ICD-10-CM | POA: Diagnosis not present

## 2016-12-31 DIAGNOSIS — K589 Irritable bowel syndrome without diarrhea: Secondary | ICD-10-CM | POA: Diagnosis not present

## 2016-12-31 DIAGNOSIS — F411 Generalized anxiety disorder: Secondary | ICD-10-CM | POA: Diagnosis not present

## 2016-12-31 DIAGNOSIS — M1991 Primary osteoarthritis, unspecified site: Secondary | ICD-10-CM | POA: Diagnosis not present

## 2016-12-31 DIAGNOSIS — K219 Gastro-esophageal reflux disease without esophagitis: Secondary | ICD-10-CM | POA: Diagnosis not present

## 2016-12-31 DIAGNOSIS — G8929 Other chronic pain: Secondary | ICD-10-CM | POA: Diagnosis not present

## 2016-12-31 DIAGNOSIS — D126 Benign neoplasm of colon, unspecified: Secondary | ICD-10-CM | POA: Diagnosis not present

## 2016-12-31 DIAGNOSIS — M542 Cervicalgia: Secondary | ICD-10-CM | POA: Diagnosis not present

## 2016-12-31 DIAGNOSIS — M810 Age-related osteoporosis without current pathological fracture: Secondary | ICD-10-CM | POA: Diagnosis not present

## 2017-01-02 DIAGNOSIS — F411 Generalized anxiety disorder: Secondary | ICD-10-CM | POA: Diagnosis not present

## 2017-01-02 DIAGNOSIS — G8929 Other chronic pain: Secondary | ICD-10-CM | POA: Diagnosis not present

## 2017-01-02 DIAGNOSIS — M1991 Primary osteoarthritis, unspecified site: Secondary | ICD-10-CM | POA: Diagnosis not present

## 2017-01-02 DIAGNOSIS — M810 Age-related osteoporosis without current pathological fracture: Secondary | ICD-10-CM | POA: Diagnosis not present

## 2017-01-02 DIAGNOSIS — M542 Cervicalgia: Secondary | ICD-10-CM | POA: Diagnosis not present

## 2017-01-02 DIAGNOSIS — R296 Repeated falls: Secondary | ICD-10-CM | POA: Diagnosis not present

## 2017-01-07 DIAGNOSIS — R296 Repeated falls: Secondary | ICD-10-CM | POA: Diagnosis not present

## 2017-01-07 DIAGNOSIS — F411 Generalized anxiety disorder: Secondary | ICD-10-CM | POA: Diagnosis not present

## 2017-01-07 DIAGNOSIS — G8929 Other chronic pain: Secondary | ICD-10-CM | POA: Diagnosis not present

## 2017-01-07 DIAGNOSIS — M810 Age-related osteoporosis without current pathological fracture: Secondary | ICD-10-CM | POA: Diagnosis not present

## 2017-01-07 DIAGNOSIS — M1991 Primary osteoarthritis, unspecified site: Secondary | ICD-10-CM | POA: Diagnosis not present

## 2017-01-07 DIAGNOSIS — M542 Cervicalgia: Secondary | ICD-10-CM | POA: Diagnosis not present

## 2017-01-08 DIAGNOSIS — N952 Postmenopausal atrophic vaginitis: Secondary | ICD-10-CM | POA: Diagnosis not present

## 2017-01-08 DIAGNOSIS — N8111 Cystocele, midline: Secondary | ICD-10-CM | POA: Diagnosis not present

## 2017-01-10 ENCOUNTER — Other Ambulatory Visit: Payer: Self-pay | Admitting: Family Medicine

## 2017-01-10 DIAGNOSIS — M542 Cervicalgia: Secondary | ICD-10-CM | POA: Diagnosis not present

## 2017-01-10 DIAGNOSIS — R296 Repeated falls: Secondary | ICD-10-CM | POA: Diagnosis not present

## 2017-01-10 DIAGNOSIS — F411 Generalized anxiety disorder: Secondary | ICD-10-CM | POA: Diagnosis not present

## 2017-01-10 DIAGNOSIS — G8929 Other chronic pain: Secondary | ICD-10-CM | POA: Diagnosis not present

## 2017-01-10 DIAGNOSIS — M810 Age-related osteoporosis without current pathological fracture: Secondary | ICD-10-CM | POA: Diagnosis not present

## 2017-01-10 DIAGNOSIS — M1991 Primary osteoarthritis, unspecified site: Secondary | ICD-10-CM | POA: Diagnosis not present

## 2017-01-11 ENCOUNTER — Other Ambulatory Visit: Payer: Self-pay | Admitting: Family Medicine

## 2017-01-14 DIAGNOSIS — G8929 Other chronic pain: Secondary | ICD-10-CM | POA: Diagnosis not present

## 2017-01-14 DIAGNOSIS — M542 Cervicalgia: Secondary | ICD-10-CM | POA: Diagnosis not present

## 2017-01-14 DIAGNOSIS — F411 Generalized anxiety disorder: Secondary | ICD-10-CM | POA: Diagnosis not present

## 2017-01-14 DIAGNOSIS — M1991 Primary osteoarthritis, unspecified site: Secondary | ICD-10-CM | POA: Diagnosis not present

## 2017-01-14 DIAGNOSIS — M810 Age-related osteoporosis without current pathological fracture: Secondary | ICD-10-CM | POA: Diagnosis not present

## 2017-01-14 DIAGNOSIS — R296 Repeated falls: Secondary | ICD-10-CM | POA: Diagnosis not present

## 2017-01-16 DIAGNOSIS — F411 Generalized anxiety disorder: Secondary | ICD-10-CM | POA: Diagnosis not present

## 2017-01-16 DIAGNOSIS — M542 Cervicalgia: Secondary | ICD-10-CM | POA: Diagnosis not present

## 2017-01-16 DIAGNOSIS — M810 Age-related osteoporosis without current pathological fracture: Secondary | ICD-10-CM | POA: Diagnosis not present

## 2017-01-16 DIAGNOSIS — M1991 Primary osteoarthritis, unspecified site: Secondary | ICD-10-CM | POA: Diagnosis not present

## 2017-01-16 DIAGNOSIS — G8929 Other chronic pain: Secondary | ICD-10-CM | POA: Diagnosis not present

## 2017-01-16 DIAGNOSIS — R296 Repeated falls: Secondary | ICD-10-CM | POA: Diagnosis not present

## 2017-01-22 DIAGNOSIS — R296 Repeated falls: Secondary | ICD-10-CM | POA: Diagnosis not present

## 2017-01-22 DIAGNOSIS — G8929 Other chronic pain: Secondary | ICD-10-CM | POA: Diagnosis not present

## 2017-01-22 DIAGNOSIS — M1991 Primary osteoarthritis, unspecified site: Secondary | ICD-10-CM | POA: Diagnosis not present

## 2017-01-22 DIAGNOSIS — M810 Age-related osteoporosis without current pathological fracture: Secondary | ICD-10-CM | POA: Diagnosis not present

## 2017-01-22 DIAGNOSIS — M542 Cervicalgia: Secondary | ICD-10-CM | POA: Diagnosis not present

## 2017-01-22 DIAGNOSIS — F411 Generalized anxiety disorder: Secondary | ICD-10-CM | POA: Diagnosis not present

## 2017-01-24 DIAGNOSIS — M810 Age-related osteoporosis without current pathological fracture: Secondary | ICD-10-CM | POA: Diagnosis not present

## 2017-01-24 DIAGNOSIS — M1991 Primary osteoarthritis, unspecified site: Secondary | ICD-10-CM | POA: Diagnosis not present

## 2017-01-24 DIAGNOSIS — M542 Cervicalgia: Secondary | ICD-10-CM | POA: Diagnosis not present

## 2017-01-24 DIAGNOSIS — F411 Generalized anxiety disorder: Secondary | ICD-10-CM | POA: Diagnosis not present

## 2017-01-24 DIAGNOSIS — R296 Repeated falls: Secondary | ICD-10-CM | POA: Diagnosis not present

## 2017-01-24 DIAGNOSIS — G8929 Other chronic pain: Secondary | ICD-10-CM | POA: Diagnosis not present

## 2017-01-29 DIAGNOSIS — M1991 Primary osteoarthritis, unspecified site: Secondary | ICD-10-CM | POA: Diagnosis not present

## 2017-01-29 DIAGNOSIS — M810 Age-related osteoporosis without current pathological fracture: Secondary | ICD-10-CM | POA: Diagnosis not present

## 2017-01-29 DIAGNOSIS — F411 Generalized anxiety disorder: Secondary | ICD-10-CM | POA: Diagnosis not present

## 2017-01-29 DIAGNOSIS — M542 Cervicalgia: Secondary | ICD-10-CM | POA: Diagnosis not present

## 2017-01-29 DIAGNOSIS — G8929 Other chronic pain: Secondary | ICD-10-CM | POA: Diagnosis not present

## 2017-01-29 DIAGNOSIS — R296 Repeated falls: Secondary | ICD-10-CM | POA: Diagnosis not present

## 2017-01-30 DIAGNOSIS — G8929 Other chronic pain: Secondary | ICD-10-CM | POA: Diagnosis not present

## 2017-01-30 DIAGNOSIS — F411 Generalized anxiety disorder: Secondary | ICD-10-CM | POA: Diagnosis not present

## 2017-01-30 DIAGNOSIS — M1991 Primary osteoarthritis, unspecified site: Secondary | ICD-10-CM | POA: Diagnosis not present

## 2017-01-30 DIAGNOSIS — M810 Age-related osteoporosis without current pathological fracture: Secondary | ICD-10-CM | POA: Diagnosis not present

## 2017-01-30 DIAGNOSIS — M542 Cervicalgia: Secondary | ICD-10-CM | POA: Diagnosis not present

## 2017-01-30 DIAGNOSIS — R296 Repeated falls: Secondary | ICD-10-CM | POA: Diagnosis not present

## 2017-02-25 ENCOUNTER — Telehealth: Payer: Self-pay | Admitting: Family Medicine

## 2017-02-25 NOTE — Telephone Encounter (Signed)
Patient wants to know if Dr Moshe Cipro can refer her to a specialist.  She thinks that a "kidney doctor" would be in order for her bladder.  She states her bladder falls out  and the uterus is dropping.  She would like to see the someone other than the "bladder doctor".   She is frequently having void every hour and pain associated with the LEFT side.  Please advise

## 2017-02-26 ENCOUNTER — Other Ambulatory Visit: Payer: Self-pay | Admitting: Family Medicine

## 2017-02-26 DIAGNOSIS — N8110 Cystocele, unspecified: Secondary | ICD-10-CM

## 2017-02-26 DIAGNOSIS — R350 Frequency of micturition: Secondary | ICD-10-CM

## 2017-02-26 NOTE — Telephone Encounter (Signed)
Patient requesting appt with urology here in [place] to check her kidneys. States she has seen gyne already about the uterus prolapse

## 2017-02-26 NOTE — Telephone Encounter (Signed)
pls explain that the problem she is describing needs to be addressed by a surgical specialist which is a urologist or gynecologist, she has  seen both, a "kidney doc, which is who she is requesting does not address either

## 2017-02-26 NOTE — Telephone Encounter (Signed)
Referred  Back to Dr Blair Dolphin who she has seen in 2016

## 2017-02-27 NOTE — Telephone Encounter (Signed)
Pt aware to expect appt call

## 2017-03-20 ENCOUNTER — Other Ambulatory Visit: Payer: Self-pay | Admitting: Family Medicine

## 2017-03-20 ENCOUNTER — Encounter (HOSPITAL_COMMUNITY): Payer: Self-pay | Admitting: Emergency Medicine

## 2017-03-20 ENCOUNTER — Telehealth: Payer: Self-pay | Admitting: Family Medicine

## 2017-03-20 ENCOUNTER — Emergency Department (HOSPITAL_COMMUNITY): Payer: Medicare Other

## 2017-03-20 ENCOUNTER — Other Ambulatory Visit: Payer: Self-pay

## 2017-03-20 ENCOUNTER — Emergency Department (HOSPITAL_COMMUNITY)
Admission: EM | Admit: 2017-03-20 | Discharge: 2017-03-20 | Discharge status: Against Medical Advice | Payer: Medicare Other | Attending: Emergency Medicine | Admitting: Emergency Medicine

## 2017-03-20 DIAGNOSIS — F17290 Nicotine dependence, other tobacco product, uncomplicated: Secondary | ICD-10-CM | POA: Insufficient documentation

## 2017-03-20 DIAGNOSIS — Z7982 Long term (current) use of aspirin: Secondary | ICD-10-CM | POA: Insufficient documentation

## 2017-03-20 DIAGNOSIS — M545 Low back pain, unspecified: Secondary | ICD-10-CM

## 2017-03-20 DIAGNOSIS — R0789 Other chest pain: Secondary | ICD-10-CM | POA: Diagnosis not present

## 2017-03-20 DIAGNOSIS — N281 Cyst of kidney, acquired: Secondary | ICD-10-CM | POA: Insufficient documentation

## 2017-03-20 DIAGNOSIS — Z79899 Other long term (current) drug therapy: Secondary | ICD-10-CM | POA: Diagnosis not present

## 2017-03-20 DIAGNOSIS — R911 Solitary pulmonary nodule: Secondary | ICD-10-CM

## 2017-03-20 DIAGNOSIS — K449 Diaphragmatic hernia without obstruction or gangrene: Secondary | ICD-10-CM | POA: Diagnosis not present

## 2017-03-20 DIAGNOSIS — R079 Chest pain, unspecified: Secondary | ICD-10-CM | POA: Diagnosis not present

## 2017-03-20 LAB — BASIC METABOLIC PANEL
ANION GAP: 9 (ref 5–15)
BUN: 7 mg/dL (ref 6–20)
CALCIUM: 9.7 mg/dL (ref 8.9–10.3)
CO2: 29 mmol/L (ref 22–32)
Chloride: 102 mmol/L (ref 101–111)
Creatinine, Ser: 0.6 mg/dL (ref 0.44–1.00)
GFR calc Af Amer: >60 mL/min (ref >60)
GLUCOSE: 109 mg/dL — AB (ref 65–99)
POTASSIUM: 4.2 mmol/L (ref 3.5–5.1)
SODIUM: 140 mmol/L (ref 135–145)

## 2017-03-20 LAB — CBC
HCT: 36 % (ref 36.0–46.0)
Hemoglobin: 11.5 g/dL — ABNORMAL LOW (ref 12.0–15.0)
MCH: 30.7 pg (ref 26.0–34.0)
MCHC: 31.9 g/dL (ref 30.0–36.0)
MCV: 96 fL (ref 78.0–100.0)
PLATELETS: 352 10*3/uL (ref 150–400)
RBC: 3.75 MIL/uL — ABNORMAL LOW (ref 3.87–5.11)
RDW: 16.1 % — AB (ref 11.5–15.5)
WBC: 7.3 10*3/uL (ref 4.0–10.5)

## 2017-03-20 LAB — URINALYSIS, ROUTINE W REFLEX MICROSCOPIC
BILIRUBIN URINE: NEGATIVE
Bacteria, UA: NONE SEEN
GLUCOSE, UA: NEGATIVE mg/dL
HGB URINE DIPSTICK: NEGATIVE
Ketones, ur: NEGATIVE mg/dL
Nitrite: NEGATIVE
PH: 7 (ref 5.0–8.0)
Protein, ur: NEGATIVE mg/dL
SPECIFIC GRAVITY, URINE: 1.003 — AB (ref 1.005–1.030)
Squamous Epithelial / LPF: NONE SEEN

## 2017-03-20 LAB — TROPONIN I

## 2017-03-20 MED ORDER — MORPHINE SULFATE (PF) 2 MG/ML IV SOLN
2.0000 mg | INTRAVENOUS | Status: DC | PRN
  Filled 2017-03-20: qty 1

## 2017-03-20 MED ORDER — HYDROCODONE-ACETAMINOPHEN 5-325 MG PO TABS: ORAL_TABLET | ORAL | 0 refills | Status: DC

## 2017-03-20 MED ORDER — HYDROCODONE-ACETAMINOPHEN 5-325 MG PO TABS
1.0000 | ORAL_TABLET | Freq: Once | ORAL | Status: AC
  Administered 2017-03-20: 1 via ORAL
  Filled 2017-03-20: qty 1

## 2017-03-20 MED ORDER — IOPAMIDOL (ISOVUE-370) INJECTION 76%
100.0000 mL | Freq: Once | INTRAVENOUS | Status: AC | PRN
  Administered 2017-03-20: 100 mL via INTRAVENOUS

## 2017-03-20 MED ORDER — SODIUM CHLORIDE 0.9 % IV SOLN
INTRAVENOUS | Status: DC
  Administered 2017-03-20: 18:00:00 via INTRAVENOUS

## 2017-03-20 NOTE — Discharge Instructions (Signed)
Take the prescriptions as directed.  Apply moist heat or ice to the area(s) of discomfort, for 15 minutes at a time, several times per day for the next few days.  Do not fall asleep on a heating or ice pack.  Your CT scan showed an incidental finding:  "Part solid nodule in the left upper lobe is nonspecific and may be postinflammatory/infectious in etiology. Follow-up non-contrast CT recommended at 3-6 months to confirm persistence. If unchanged, and solid component remains <6 mm, annual CT is recommended until 5 years of stability has been established. If persistent these nodules should be considered highly suspicious if the solid component of the nodule is 6 mm or greater in size and enlarging. This recommendation follows the consensus statement: Guidelines for Management of Incidental Pulmonary Nodules Detected on CT Images:From the Fleischner Society 2017; published online before print (10.1148/radiol.5176160737)."   Your regular medical doctor can follow up this finding. Call your regular medical doctor tomorrow to schedule a follow up appointment in the next 2 days.  Return to the Emergency Department immediately if worsening.
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
## 2017-03-20 NOTE — ED Notes (Signed)
Pt's family refused for patient to be ambulated

## 2017-03-20 NOTE — ED Triage Notes (Signed)
Pt reports bilateral shoulder pain that has progressed into lower back. Pt reports intermittent chest discomfort. nad noted. Pt alert and oriented. Pt nondiaphoretic, denies shortness of breath.

## 2017-03-20 NOTE — Telephone Encounter (Signed)
I talked to daughter and advised her to carry her mother to the ER per Selena.

## 2017-03-20 NOTE — Telephone Encounter (Signed)
Patient's daughter calling because patient is not walking well because of her back and L hip, very fragile, looks like she has lost weight since last visit with Dr Moshe Cipro. The daughter thinks it may be a stress fx even those she hasn't taken a fall.  She seems to be in a lot of pain.  I offered appt,for tomorrow, but daughter is requesting xray at [facility] since it is somewhat difficult to get her in and out of car. She is talking about not wanting to live if she has to go thru this type of pain.  Please advise. cb for [employer] [phone]

## 2017-03-20 NOTE — ED Notes (Signed)
EKG given to Dr. Liu.  

## 2017-03-25 ENCOUNTER — Telehealth: Payer: Self-pay | Admitting: Family Medicine

## 2017-03-25 NOTE — Telephone Encounter (Signed)
Patient's daughter [name] calling to request more Hydrocodone for the patient's pain in the hip and lower back. Patient was seen in [facility] ED on Wednesday but daughter states that they could not find anything wrong even after a CT Scan. She says that the patient struggles to get up from a seated position without yelling out in pain.  The daughter states that she feels that they have tried everything such as moist heat, different beds, ice, ointment, heating pads, gentle message and hydrocodone (which doesn't last 12 hours).  Daughter would like to try every 6 hours with the hydrocodone.  Please call and advise.  Also please let her know if they clonazepam has been refilled. She was told to f/u with PCP for any pain med.

## 2017-03-25 NOTE — Telephone Encounter (Signed)
pls work in pt tomorrow

## 2017-03-26 ENCOUNTER — Encounter: Payer: Self-pay | Admitting: Family Medicine

## 2017-03-26 ENCOUNTER — Ambulatory Visit (INDEPENDENT_AMBULATORY_CARE_PROVIDER_SITE_OTHER): Payer: Medicare Other | Admitting: Family Medicine

## 2017-03-26 ENCOUNTER — Telehealth: Payer: Self-pay | Admitting: Family Medicine

## 2017-03-26 VITALS — BP 150/60 | HR 80 | Resp 16 | Ht 62.0 in | Wt 109.0 lb

## 2017-03-26 DIAGNOSIS — M810 Age-related osteoporosis without current pathological fracture: Secondary | ICD-10-CM

## 2017-03-26 DIAGNOSIS — N3001 Acute cystitis with hematuria: Secondary | ICD-10-CM | POA: Insufficient documentation

## 2017-03-26 DIAGNOSIS — R911 Solitary pulmonary nodule: Secondary | ICD-10-CM

## 2017-03-26 DIAGNOSIS — R2681 Unsteadiness on feet: Secondary | ICD-10-CM

## 2017-03-26 DIAGNOSIS — Z09 Encounter for follow-up examination after completed treatment for conditions other than malignant neoplasm: Secondary | ICD-10-CM | POA: Diagnosis not present

## 2017-03-26 DIAGNOSIS — I2510 Atherosclerotic heart disease of native coronary artery without angina pectoris: Secondary | ICD-10-CM

## 2017-03-26 DIAGNOSIS — M549 Dorsalgia, unspecified: Secondary | ICD-10-CM | POA: Diagnosis not present

## 2017-03-26 HISTORY — DX: Solitary pulmonary nodule: R91.1

## 2017-03-26 LAB — POCT URINALYSIS DIPSTICK
BILIRUBIN UA: NEGATIVE
Glucose, UA: NEGATIVE
Ketones, UA: NEGATIVE
NITRITE UA: NEGATIVE
PH UA: 5.5 (ref 5.0–8.0)
Protein, UA: NEGATIVE
Spec Grav, UA: 1.015 (ref 1.010–1.025)
Urobilinogen, UA: 0.2 E.U./dL

## 2017-03-26 MED ORDER — HYDROCODONE-ACETAMINOPHEN 5-325 MG PO TABS: ORAL_TABLET | ORAL | 0 refills | Status: DC

## 2017-03-26 MED ORDER — KETOROLAC TROMETHAMINE 60 MG/2ML IM SOLN
30.0000 mg | Freq: Once | INTRAMUSCULAR | Status: AC
  Administered 2017-03-26: 30 mg via INTRAMUSCULAR

## 2017-03-26 NOTE — Assessment & Plan Note (Signed)
Coronary arthrosclerosis noted on recent chest scan in LAD and pt had c/o chest pain in the ED will refer to cardiology for further evaluation

## 2017-03-26 NOTE — Assessment & Plan Note (Addendum)
Ongoing significant back pain requiring short term use of hydrocodone, toradol 30 mg im administered in office

## 2017-03-26 NOTE — Patient Instructions (Addendum)
F/u in 5 weeks , call if you need me before  Toradol 30 mg  in office  Today for back pain  I will refer you for once yearly treatment for  Bones  Short term pain medication for help with back pain, maximum every 12 hours for next 5 days, then once daily  Use Es tylenol/ tylenol arthritis 500 mg one daily as a back up once the hydrocodone  You will be referred to cardiology and also will get a repeat chest scan in 3 months to folow up nodule
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
## 2017-03-26 NOTE — Assessment & Plan Note (Signed)
With compression fractures and severe osteoporosis, will refer for prolia or reclast

## 2017-03-26 NOTE — Progress Notes (Signed)
   Kara [name]     MRN: [id number]      DOB: 1927/04/26   HPI Kara [name] is here for follow up from ED visit last week when she was evaluated for severe pain, she is accompanied by one of her daughter. Pt states she is still having significant pain, that her pain medication does help some , but she is aware that it "gives her hallucinations" when specifically asked about hallucinations, denied visual or auditory hallucinations, just felt different. Daughter surprised to learn of the fact that compression fractures were noted on CXR an after the visit a 2nd daughter called stating the same C/o mild pressure and burning with urination, denies fever , chills , flank pain or blood in the urine Radiology report from ED visit reviewed, discussed finding of lung nodule and artherosclerosis of LAD and will arrange f/u with cardiology and also wit rept scan  For osteopenia and compression fracture will arrange for  ROS Denies recent fever or chills. Denies sinus pressure, nasal congestion, ear pain or sore throat. Denies chest congestion, productive cough or wheezing. Denies , palpitations and leg swelling Denies abdominal pain, nausea, vomiting,diarrhea or constipation.   Denies headaches, seizures, numbness, or tingling. Denies depression, anxiety or insomnia. Denies skin break down or rash.   PE  BP (!) 150/60   Pulse 80   Resp 16   Ht 5\' 2"  (1.575 m)   Wt 109 lb (49.4 kg)   BMI 19.94 kg/m   Patient alert and oriented and in no cardiopulmonary distress.  HEENT: No facial asymmetry, EOMI,   oropharynx pink and moist.  Neck decreased ROM no JVD, no mass.  Chest: Clear to auscultation bilaterally.  CVS: S1, S2 no murmurs, no S3.Regular rate.  ABD: Soft non tender.   Ext: No edema  MS: Decreased  ROM spine, shoulders, hips and knees.  Skin: Intact, no ulcerations or rash noted.  Psych: Good eye contact, normal affect. Memory intact not anxious or depressed appearing.  CNS: CN  2-12 intact, power,  normal throughout.no focal deficits noted.   Assessment & Plan  Senile osteoporosis With compression fractures and severe osteoporosis, will refer for prolia or reclast   CAD (coronary atherosclerotic disease) Coronary arthrosclerosis noted on recent chest scan in LAD and pt had c/o chest pain in the ED will refer to cardiology for further evaluation  Unsteady gait Arthritis of spine , increased fall risk and back pain , need for assistive device for safety , will order walker wit wheels  for use iside and outside of the home  Pulmonary nodule, left No c/o cough, weight loss or hemoptysis, incidental finding on chest scan need to rept in 3 to 6 months  Acute cystitis with hematuria Mildly symptomatic with abn UA, will await c/s before prescribing antibiotic  Encounter for examination following treatment at hospital Ongoing significant back pain requiring short term use of hydrocodone, toradol 30 mg im administered in office

## 2017-03-26 NOTE — Assessment & Plan Note (Addendum)
Arthritis of spine , increased fall risk and back pain , need for assistive device for safety , will order walker wit wheels  for use iside and outside of the home

## 2017-03-26 NOTE — Telephone Encounter (Signed)
I spoke directly with Kara [name] and explained that old compression fractures were noted in the CXR done in the ED on 5/30.   Pls send in order for requested equipment, she had asked if available at [facility] , I told her I doubted that but if so pls send there if not then to  [place] , thanks

## 2017-03-26 NOTE — Assessment & Plan Note (Signed)
Mildly symptomatic with abn UA, will await c/s before prescribing antibiotic

## 2017-03-26 NOTE — Assessment & Plan Note (Signed)
No c/o cough, weight loss or hemoptysis, incidental finding on chest scan need to rept in 3 to 6 months

## 2017-03-27 LAB — URINE CULTURE: Organism ID, Bacteria: NO GROWTH

## 2017-03-27 NOTE — Telephone Encounter (Signed)
Walker sent

## 2017-03-28 NOTE — Progress Notes (Signed)
Called Davene, no answer.

## 2017-03-29 ENCOUNTER — Telehealth: Payer: Self-pay | Admitting: Family Medicine

## 2017-03-29 NOTE — Telephone Encounter (Signed)
Patients daughter is requesting a call back about patients urine culture. Cb#: [phone]

## 2017-04-01 ENCOUNTER — Other Ambulatory Visit: Payer: Self-pay | Admitting: Family Medicine

## 2017-04-01 ENCOUNTER — Telehealth: Payer: Self-pay | Admitting: Family Medicine

## 2017-04-01 DIAGNOSIS — M546 Pain in thoracic spine: Secondary | ICD-10-CM

## 2017-04-01 NOTE — Telephone Encounter (Signed)
Patient's daughter  [name]) calling requesting a referral to Dr Aline Brochure or Dr. Luna Glasgow for the pain in lower back and both hips.    cb  [phone]

## 2017-04-01 NOTE — Telephone Encounter (Signed)
pls let her know referrla is sent in, thanks

## 2017-04-01 NOTE — Telephone Encounter (Signed)
Left message on Sharon's voice mail letting her know that Dr. Moshe Cipro sent in the referral for [facility] to be seen for lower back and bilateral hips.

## 2017-04-01 NOTE — Telephone Encounter (Signed)
Aware that it is normal

## 2017-04-03 ENCOUNTER — Ambulatory Visit: Payer: Medicare Other | Admitting: Orthopaedic Surgery

## 2017-04-04 ENCOUNTER — Ambulatory Visit (INDEPENDENT_AMBULATORY_CARE_PROVIDER_SITE_OTHER): Payer: Medicare Other

## 2017-04-04 ENCOUNTER — Telehealth: Payer: Self-pay | Admitting: Orthopaedic Surgery

## 2017-04-04 ENCOUNTER — Encounter: Payer: Self-pay | Admitting: Orthopaedic Surgery

## 2017-04-04 ENCOUNTER — Ambulatory Visit (INDEPENDENT_AMBULATORY_CARE_PROVIDER_SITE_OTHER): Payer: Medicare Other | Admitting: Orthopaedic Surgery

## 2017-04-04 VITALS — BP 131/61 | HR 74 | Temp 97.7°F | Ht 62.0 in | Wt 108.0 lb

## 2017-04-04 DIAGNOSIS — M8000XA Age-related osteoporosis with current pathological fracture, unspecified site, initial encounter for fracture: Secondary | ICD-10-CM

## 2017-04-04 DIAGNOSIS — S32030A Wedge compression fracture of third lumbar vertebra, initial encounter for closed fracture: Secondary | ICD-10-CM | POA: Diagnosis not present

## 2017-04-04 DIAGNOSIS — M546 Pain in thoracic spine: Secondary | ICD-10-CM | POA: Diagnosis not present

## 2017-04-04 DIAGNOSIS — I2510 Atherosclerotic heart disease of native coronary artery without angina pectoris: Secondary | ICD-10-CM

## 2017-04-04 HISTORY — DX: Wedge compression fracture of third lumbar vertebra, initial encounter for closed fracture: S32.030A
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
## 2017-04-04 NOTE — Telephone Encounter (Signed)
Pt's daughter [name] called and said that Ms. Meneely was having a fit about the brace and does want to wear it.  Ms. Alviar says the cash brace is hurting her and that the plate goes down between her legs when she is sitting and goes up and rubs her throat.  [name] also states that she has adjusted the brace to be as small as it will go.    I called Tressa Busman who fitted Ms. Waun[place]a with the brace while she was here in the office.  I told her what the daughter had to say and that she had adjusted it all she could but that her mother was just having a fit.  Glynda asked that I call [name] back and let her know that Ms. Esguerra doesn't have to wear the brace while she is in the recliner chair or in bed.  Glynda further advised that maybe [name] could take her mother's brace to [facility] to see if they have anything comparable to the cash brace that we have here.  I called [name] back and advised her of everything Glynda said.  She said she would keep trying to encourage her mother to wear the brace and that she would call [facility] to see what, if anything they could offer.  I told her that if she calls them, Romie Levee said her mother needs an anterior hypertension brace.  [name] said she would check on this.

## 2017-04-08 ENCOUNTER — Telehealth: Payer: Self-pay | Admitting: *Deleted
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
## 2017-04-08 NOTE — Telephone Encounter (Signed)
Patient and her daughter called stating patient is in a lot of pain from her back, and she is not wearing the brace that Dr Luna Glasgow has told her to wear (See [place] phone note) patient is requesting pain medication. Please call patient's Daughter [name] [phone] to let her know what patient can do, I made her aware to call Dr Briscoe Burns office and Dr Moshe Cipro is out of the office until next week, [name] and Ciji would like to hear something back from our office regarding this situation. Please Arnoldo Hooker

## 2017-04-09 ENCOUNTER — Ambulatory Visit (INDEPENDENT_AMBULATORY_CARE_PROVIDER_SITE_OTHER): Payer: Medicare Other

## 2017-04-09 ENCOUNTER — Ambulatory Visit (INDEPENDENT_AMBULATORY_CARE_PROVIDER_SITE_OTHER): Payer: Self-pay | Admitting: Orthopaedic Surgery

## 2017-04-09 ENCOUNTER — Ambulatory Visit (INDEPENDENT_AMBULATORY_CARE_PROVIDER_SITE_OTHER): Payer: Medicare Other | Admitting: Cardiovascular Disease

## 2017-04-09 ENCOUNTER — Encounter: Payer: Self-pay | Admitting: Orthopaedic Surgery

## 2017-04-09 ENCOUNTER — Encounter: Payer: Self-pay | Admitting: Cardiovascular Disease

## 2017-04-09 VITALS — BP 138/58 | HR 73 | Ht 61.0 in | Wt 109.0 lb

## 2017-04-09 VITALS — BP 135/68 | HR 73 | Ht 61.0 in | Wt 108.0 lb

## 2017-04-09 DIAGNOSIS — I2510 Atherosclerotic heart disease of native coronary artery without angina pectoris: Secondary | ICD-10-CM | POA: Diagnosis not present

## 2017-04-09 DIAGNOSIS — S32030D Wedge compression fracture of third lumbar vertebra, subsequent encounter for fracture with routine healing: Secondary | ICD-10-CM | POA: Diagnosis not present

## 2017-04-09 DIAGNOSIS — R0989 Other specified symptoms and signs involving the circulatory and respiratory systems: Secondary | ICD-10-CM | POA: Diagnosis not present

## 2017-04-09 DIAGNOSIS — M8000XA Age-related osteoporosis with current pathological fracture, unspecified site, initial encounter for fracture: Secondary | ICD-10-CM

## 2017-04-09 MED ORDER — NITROGLYCERIN 0.4 MG SL SUBL: 0.4000 mg | SUBLINGUAL_TABLET | SUBLINGUAL | 3 refills | Status: DC | PRN

## 2017-04-09 NOTE — Progress Notes (Signed)
CC:  I am feeling better.  She has stopped using the CASH brace.  She does not like it.  I have told her she will do better but she says at her age she will not wear it.  I understand.  X-rays were done of the lumbar spine today, reported separately.  NV intact.  Gait is good.  Encounter Diagnoses  Name Primary?  . Closed compression fracture of L3 lumbar vertebra with routine healing, subsequent encounter Yes  . Age-related osteoporosis with current pathological fracture, initial encounter    Return in one month.  Call if any problem.  Precautions discussed.   Electronically Signed Sanjuana Kava, MD 6/19/20183:24 PM

## 2017-04-09 NOTE — Patient Instructions (Signed)
Medication Instructions:  Your physician recommends that you continue on your current medications as directed. Please refer to the Current Medication list given to you today.   Labwork: NONE  Testing/Procedures: Your physician has requested that you have a carotid duplex. This test is an ultrasound of the carotid arteries in your neck. It looks at blood flow through these arteries that supply the brain with blood. Allow one hour for this exam. There are no restrictions or special instructions.    Follow-Up: Your physician wants you to follow-up in: 6 MONTHS.  You will receive a reminder letter in the mail two months in advance. If you don't receive a letter, please call our office to schedule the follow-up appointment.   Any Other Special Instructions Will Be Listed Below (If Applicable).     If you need a refill on your cardiac medications before your next appointment, please call your pharmacy.   

## 2017-04-10 ENCOUNTER — Ambulatory Visit: Payer: Medicare Other | Admitting: Orthopaedic Surgery

## 2017-04-10 NOTE — Telephone Encounter (Signed)
I was not in the office yesterday. I see that patient was seen by [facility] office yesterday and states she feels better

## 2017-04-11 ENCOUNTER — Ambulatory Visit: Payer: Medicare Other | Admitting: Orthopaedic Surgery

## 2017-04-12 ENCOUNTER — Other Ambulatory Visit: Payer: Self-pay | Admitting: Gastroenterology

## 2017-04-17 ENCOUNTER — Ambulatory Visit (HOSPITAL_COMMUNITY)
Admission: RE | Admit: 2017-04-17 | Discharge: 2017-04-17 | Disposition: A | Discharge status: Home / Self Care | Payer: Medicare Other | Source: Ambulatory Visit | Attending: Cardiovascular Disease | Admitting: Cardiovascular Disease

## 2017-04-17 DIAGNOSIS — R0989 Other specified symptoms and signs involving the circulatory and respiratory systems: Secondary | ICD-10-CM | POA: Diagnosis present

## 2017-04-17 DIAGNOSIS — I6523 Occlusion and stenosis of bilateral carotid arteries: Secondary | ICD-10-CM | POA: Diagnosis not present

## 2017-04-25 ENCOUNTER — Ambulatory Visit (INDEPENDENT_AMBULATORY_CARE_PROVIDER_SITE_OTHER): Payer: Medicare Other | Admitting: Family Medicine

## 2017-04-25 ENCOUNTER — Encounter: Payer: Self-pay | Admitting: Family Medicine

## 2017-04-25 VITALS — BP 140/60 | HR 71 | Temp 98.9°F | Ht 61.0 in | Wt 106.1 lb

## 2017-04-25 DIAGNOSIS — S32030S Wedge compression fracture of third lumbar vertebra, sequela: Secondary | ICD-10-CM | POA: Diagnosis not present

## 2017-04-25 DIAGNOSIS — I2510 Atherosclerotic heart disease of native coronary artery without angina pectoris: Secondary | ICD-10-CM

## 2017-04-25 DIAGNOSIS — E785 Hyperlipidemia, unspecified: Secondary | ICD-10-CM

## 2017-04-25 DIAGNOSIS — I10 Essential (primary) hypertension: Secondary | ICD-10-CM | POA: Diagnosis not present

## 2017-04-25 DIAGNOSIS — K219 Gastro-esophageal reflux disease without esophagitis: Secondary | ICD-10-CM

## 2017-04-25 DIAGNOSIS — J302 Other seasonal allergic rhinitis: Secondary | ICD-10-CM

## 2017-04-25 DIAGNOSIS — M8000XS Age-related osteoporosis with current pathological fracture, unspecified site, sequela: Secondary | ICD-10-CM | POA: Diagnosis not present

## 2017-04-25 DIAGNOSIS — M5431 Sciatica, right side: Secondary | ICD-10-CM

## 2017-04-25 DIAGNOSIS — M810 Age-related osteoporosis without current pathological fracture: Secondary | ICD-10-CM | POA: Diagnosis not present

## 2017-04-25 MED ORDER — KETOROLAC TROMETHAMINE 60 MG/2ML IM SOLN
60.0000 mg | Freq: Once | INTRAMUSCULAR | Status: AC
  Administered 2017-04-25: 60 mg via INTRAMUSCULAR

## 2017-04-25 NOTE — Assessment & Plan Note (Signed)
Pt and daughter hesitant to start parenteral bisphosphonate due to possible adverse effect, continue calcium with D supplement , check vit D level nexyt vidit and weight bearing exercise

## 2017-04-25 NOTE — Assessment & Plan Note (Signed)
Increased and uncontrolled x 1 month, toradol 60 mg iM admionistered in office , continue tylenol

## 2017-04-25 NOTE — Assessment & Plan Note (Signed)
Controlled, no change in medication  

## 2017-04-25 NOTE — Progress Notes (Signed)
   Kara [name]     MRN: [id number]      DOB: July 19, 1927   HPI Ms. Hagemann is here for follow up and re-evaluation of chronic medical conditions, medication management and review of any available recent lab and radiology data.  Preventive health is updated, specifically  Cancer screening and Immunization.   Questions or concerns regarding consultations or procedures which the PT has had in the interim are  Addressed.Has been evaluated by orthopedics, for brace which she states made her hurt worse, and also by cardiology, who after normal carotid arteries assessed her risk of CAD to be minimal based on her history and exam , f/u as needed recommended The PT denies any adverse reactions to current medications since the last visit.  One month h/o h/o back and right sciaitic pain to right thigh no specific inciting trauma, requests a shot  ROS Denies recent fever or chills. Denies sinus pressure, nasal congestion, ear pain or sore throat. Denies chest congestion, productive cough or wheezing. Denies chest pains, palpitations and leg swelling Denies abdominal pain, nausea, vomiting,diarrhea or constipation.   tDenies dysuria, frequency, hesitancy or incontinence.  Denies headaches, seizures, numbness, or tingling. Denies depression, anxiety or insomnia. Denies skin break down or rash.   PE  BP 140/60 (BP Location: Left Arm, Patient Position: Sitting, Cuff Size: Normal)   Pulse 71   Temp 98.9 F (37.2 C)   Ht 5\' 1"  (1.549 m)   Wt 106 lb 1.9 oz (48.1 kg)   SpO2 98%   BMI 20.05 kg/m   Patient alert and oriented and in no cardiopulmonary distress.  HEENT: No facial asymmetry, EOMI,   oropharynx pink and moist.  Neck supple no JVD, no mass.  Chest: Clear to auscultation bilaterally.  CVS: S1, S2 no murmurs, no S3.Regular rate.  ABD: Soft non tender.   Ext: No edema  MS: Decreased  ROM spine,normal in shoulders, hips and knees.  Skin: Intact, no ulcerations or rash  noted.  Psych: Good eye contact, normal affect. Memory intact not anxious or depressed appearing.  CNS: CN 2-12 intact, power,  normal throughout.no focal deficits noted.   Assessment & Plan Sciatica of right side Increased and uncontrolled x 1 month, toradol 60 mg iM admionistered in office , continue tylenol  GERD (gastroesophageal reflux disease) Controlled, no change in medication   Closed compression fracture of L3 lumbar vertebra (HCC) Healing slowly with improvement in pain, no refill on narcotic pain meication  Allergic rhinitis Controlled, no change in medication   Age-related osteoporosis with current pathological fracture Pt and daughter hesitant to start parenteral bisphosphonate due to possible adverse effect, continue calcium with D supplement , check vit D level nexyt vidit and weight bearing exercise

## 2017-04-25 NOTE — Assessment & Plan Note (Signed)
Healing slowly with improvement in pain, no refill on narcotic pain meication

## 2017-04-25 NOTE — Patient Instructions (Addendum)
F/u as before  Call if you need me before   Toradol today for right sciatic pain, use tylenol for pain, no more need for hydrocodone at this tinme  Thank you  for choosing [facility]. We consider it a privelige to serve you.  Delivering excellent health care in a caring and  compassionate way is our goal.  Partnering with you,  so that together we can achieve this goal is our strategy.

## 2017-04-29 ENCOUNTER — Other Ambulatory Visit: Payer: Self-pay | Admitting: Family Medicine

## 2017-04-29 ENCOUNTER — Telehealth: Payer: Self-pay | Admitting: Family Medicine

## 2017-04-29 DIAGNOSIS — M549 Dorsalgia, unspecified: Secondary | ICD-10-CM

## 2017-04-29 DIAGNOSIS — M5441 Lumbago with sciatica, right side: Secondary | ICD-10-CM

## 2017-04-29 NOTE — Progress Notes (Signed)
Dg lumbar spine

## 2017-04-29 NOTE — Telephone Encounter (Signed)
I spoke with [name] and I have ordered the X ray of her back as a stat

## 2017-04-29 NOTE — Telephone Encounter (Signed)
Patient's daughter [name] calling in ref to getting an order for an xray at [facility].  Patient was in the office Thursday and received an injection for pain.  Daughter states she seems to be worse since injection.  She called  Ortho but Dr. Luna Glasgow is not there. Tuesday Luna Glasgow is booked. Wednesday at 10:10 was the only available slot.  Pain has been so bad that she wanted to go to ER since Saturday.    [name] asks that you please leave voice mail if she is unable to answer (poor reception)   [phone]

## 2017-05-01 ENCOUNTER — Ambulatory Visit (INDEPENDENT_AMBULATORY_CARE_PROVIDER_SITE_OTHER): Payer: Medicare Other

## 2017-05-01 ENCOUNTER — Ambulatory Visit (INDEPENDENT_AMBULATORY_CARE_PROVIDER_SITE_OTHER): Payer: Self-pay | Admitting: Orthopaedic Surgery

## 2017-05-01 ENCOUNTER — Encounter: Payer: Self-pay | Admitting: Orthopaedic Surgery

## 2017-05-01 VITALS — BP 136/67 | HR 69 | Temp 97.4°F | Ht 61.0 in | Wt 107.0 lb

## 2017-05-01 DIAGNOSIS — M25551 Pain in right hip: Secondary | ICD-10-CM

## 2017-05-01 DIAGNOSIS — Z91199 Patient's noncompliance with other medical treatment and regimen due to unspecified reason: Secondary | ICD-10-CM

## 2017-05-01 DIAGNOSIS — M25552 Pain in left hip: Secondary | ICD-10-CM

## 2017-05-01 DIAGNOSIS — S32030D Wedge compression fracture of third lumbar vertebra, subsequent encounter for fracture with routine healing: Secondary | ICD-10-CM

## 2017-05-01 DIAGNOSIS — Z9119 Patient's noncompliance with other medical treatment and regimen: Secondary | ICD-10-CM

## 2017-05-01 NOTE — Progress Notes (Signed)
CC:  My back hurts more, my hips hurt too  She is complaining of more pain in the lower back and hips.  She has no new trauma.  She is using her cane.  She refused the brace.   I have talked to her about sitting back in the chair to get better posture.   She refuses. She says she knows how to sit in a chair.  She has no new trauma, no weakness.  NV intact.  X-rays were done of pelvis and thoracolumbar spine, reported separately.  Encounter Diagnoses  Name Primary?  . Closed compression fracture of L3 lumbar vertebra with routine healing, subsequent encounter Yes  . Bilateral hip pain   . Non compliance with medical treatment    I will see her in one month  X-ray thoracolumbar spine then.  Call if any problem.  Precautions discussed.   Electronically Signed Sanjuana Kava, MD 7/11/201811:40 AM

## 2017-05-07 ENCOUNTER — Telehealth: Payer: Self-pay | Admitting: Family Medicine

## 2017-05-07 NOTE — Telephone Encounter (Signed)
New Message  Pts daughter [name]) voiced pt is having problems in her lower abdomin area and wanting to know if pt can get a referral to see a kidney MD, preferably in [place].  Please f/u

## 2017-05-07 NOTE — Telephone Encounter (Signed)
Will need more specific info to decide if urinary symptoms then kidney Doc, if abdominal sympptoms then GI Doc will refer when more specific info available pleas obtain / explain and let me know

## 2017-05-08 NOTE — Telephone Encounter (Signed)
Called patient regarding message below. No answer, unable to leave message.  

## 2017-05-09 ENCOUNTER — Telehealth: Payer: Self-pay | Admitting: *Deleted

## 2017-05-09 ENCOUNTER — Ambulatory Visit: Payer: Medicare Other | Admitting: Orthopaedic Surgery

## 2017-05-09 NOTE — Telephone Encounter (Signed)
See other telenote

## 2017-05-09 NOTE — Telephone Encounter (Signed)
Patient is not having urinary symptoms. Symptoms are possibly related to back pain. Patient is having back pain, worse at night, that sometimes makes walking difficult. She wishes to be referred to [facility] in [place]. She is currently a patient of Dr Luna Glasgow but all she is getting is x-rays. I informed that Dr Moshe Cipro was out for the rest of the week, she said this was urgent and wanted to know if Dr Meda Coffee would okay the new referral.

## 2017-05-09 NOTE — Telephone Encounter (Signed)
Informed of message below. She states she has already called them and they need a referral. Please advise   Callback for daughter [phone]

## 2017-05-09 NOTE — Telephone Encounter (Signed)
With medicare I believe they can see anybody they wish.  I will not authorize a change, however, because the ER and Dr Luna Glasgow both document she is noncompliant with medical recommendations.  I suspect she is not getting better because she does not follow the doctor's advice.   YSN

## 2017-05-09 NOTE — Telephone Encounter (Signed)
Oretha Milch patient daughter called in reference to a message that was left on patient voicemail. Patient daughter is requesting a call back.. Her contact number is [phone]. Please advise. Thank you

## 2017-05-12 DIAGNOSIS — M25552 Pain in left hip: Secondary | ICD-10-CM | POA: Diagnosis not present

## 2017-05-12 DIAGNOSIS — X58XXXA Exposure to other specified factors, initial encounter: Secondary | ICD-10-CM | POA: Diagnosis not present

## 2017-05-12 DIAGNOSIS — S32030A Wedge compression fracture of third lumbar vertebra, initial encounter for closed fracture: Secondary | ICD-10-CM | POA: Diagnosis not present

## 2017-05-12 DIAGNOSIS — M545 Low back pain: Secondary | ICD-10-CM | POA: Diagnosis not present

## 2017-05-12 DIAGNOSIS — M25551 Pain in right hip: Secondary | ICD-10-CM | POA: Diagnosis not present

## 2017-05-12 DIAGNOSIS — S32010A Wedge compression fracture of first lumbar vertebra, initial encounter for closed fracture: Secondary | ICD-10-CM | POA: Diagnosis not present

## 2017-05-12 DIAGNOSIS — S22019A Unspecified fracture of first thoracic vertebra, initial encounter for closed fracture: Secondary | ICD-10-CM | POA: Diagnosis not present

## 2017-05-12 DIAGNOSIS — S32050A Wedge compression fracture of fifth lumbar vertebra, initial encounter for closed fracture: Secondary | ICD-10-CM | POA: Diagnosis not present

## 2017-05-12 DIAGNOSIS — R1030 Lower abdominal pain, unspecified: Secondary | ICD-10-CM | POA: Diagnosis not present

## 2017-05-13 ENCOUNTER — Other Ambulatory Visit: Payer: Self-pay | Admitting: Family Medicine

## 2017-05-13 ENCOUNTER — Telehealth: Payer: Self-pay

## 2017-05-13 DIAGNOSIS — S32030S Wedge compression fracture of third lumbar vertebra, sequela: Secondary | ICD-10-CM

## 2017-05-13 NOTE — Progress Notes (Signed)
amb ortho  

## 2017-05-13 NOTE — Telephone Encounter (Signed)
Wants her mom referred to Glenna Fellows and to be referred for a stand up MRI in [place]. Also home health and a lift chair and wheelchair. Would also like rx for Boost or Ensure and meals on wheels if possible.

## 2017-05-13 NOTE — Telephone Encounter (Signed)
I totally agrre that there is no  "new management " of patient's back complaint, will refer because of her desire to seek another Provider, referral is entered , please let her know

## 2017-05-13 NOTE — Telephone Encounter (Signed)
Pt is referred per her request

## 2017-05-14 NOTE — Telephone Encounter (Signed)
States she never went to [place] in [place] and the ER told them that it would be a waste of time and that she needed to see Dr Glenna Fellows asap and he requires an MRI before he will schedule. She needs a stand up MRI in [place]. Daughter [name] said please call her if you have any concerns about doing this. She wants it done asap

## 2017-05-14 NOTE — Telephone Encounter (Signed)
Meals on wheels she needs to apply for, contact social services/ case worker, Boost/ ensure  Same, please refer them to [facility] or equivalent She already has NEW referral to ortho in [place] , that Dr can refer for MRI and continue to follow if needs MRI, too many different requests and different specialists and tests, without giving the treating Specialists time to assess and get the job done. Please try and explain this to Kara [name], if she has additional concern and wishes to speak with me let me know, but I suggest she AT LEAST gives his SECOND ortho Doc    at least 2 to 6 weeks to assess and manage her Mother's problem

## 2017-05-14 NOTE — Telephone Encounter (Signed)
I spoke with patient 's daughter c/o executiva pain states getting depressed because of pain , was in [place] emergency room , recommended , radiateds to  Hips and legs, woill attwmpt to get mRI per request

## 2017-05-15 ENCOUNTER — Telehealth: Payer: Self-pay | Admitting: Family Medicine

## 2017-05-15 NOTE — Telephone Encounter (Signed)
I spoke directly with Daughter [name]. I explained that we have been unable to identify any location, [place] as you told me, where standing MRI lumbar spine films are done.  I explained that in my opinion  Orthopedic management is what she needed and not neurosurgical management, hence not an MRI. I encouraged her to pursue the 2nd opinion since the family was so concerned about ongoing pain and depression in her mother, but I did tell her that what Dr Luna Glasgow had recommended, I believe is exactly what will be recommended by the 2nd opinion they will get.  She expressed gratitusde for the conversation, and wants the appointment with dr Lorin Mercy in [place] to be confirmed, and I encouraged her to have a family discussion about the visit before they went so that objectives will be met.  Please DO CALL and SCHEDULE appointment in [place] with Dr Lorin Mercy for the patient , and let [name] know the appt info, thanks

## 2017-05-15 NOTE — Telephone Encounter (Signed)
pls call daughter [name], I have called about the stand up MRI , and I do not know where this is done, since she is wanting Dr Carloyn Manner to see her mother, could you possibly call his office and find out if they know if this is available and where so that the test can be ordered, and if his office does not know of it, then daughter [name] needs to be made  Aware. I called our local facility, to check already, thanks

## 2017-05-16 ENCOUNTER — Other Ambulatory Visit: Payer: Self-pay | Admitting: Family Medicine

## 2017-05-20 ENCOUNTER — Other Ambulatory Visit: Payer: Self-pay

## 2017-05-20 ENCOUNTER — Telehealth: Payer: Self-pay | Admitting: Family Medicine

## 2017-05-20 MED ORDER — CLONAZEPAM 0.5 MG PO TABS: ORAL_TABLET | ORAL | 3 refills | Status: DC

## 2017-05-20 NOTE — Telephone Encounter (Signed)
clonazePAM (KLONOPIN) 0.5 MG tablet   Patient requesting Rx to be called in @ [facility] [place], [place]

## 2017-05-20 NOTE — Telephone Encounter (Signed)
Spoke w/Brenna @ Dr. Lorin Mercy office.  She has scheduled patient Aug 9th at the [facility] office.

## 2017-05-20 NOTE — Telephone Encounter (Signed)
Patient would like to set up appt with Dr Lorin Mercy in [place].  Can you try and reach her at this number  [phone] She is staying with her daughter Louann Liv in [place], [place]

## 2017-05-20 NOTE — Telephone Encounter (Signed)
refilled 

## 2017-05-20 NOTE — Telephone Encounter (Signed)
Thank you Floy Sabina!

## 2017-05-20 NOTE — Telephone Encounter (Signed)
I called pt on 7/25, and 7/26 to schedule appt from referral. Called pt daughter with new number and scheduled patient in [place] 8/9. Dr. Lorin Mercy is not in the office this Thursday.

## 2017-05-23 ENCOUNTER — Other Ambulatory Visit: Payer: Self-pay | Admitting: *Deleted

## 2017-05-23 NOTE — Patient Outreach (Signed)
[facility]) Care Management  03/29/1274  SOPHEAP BASIC 1/70/0174 [id number]  MD referral: dx Closed compression fracture of 3rd lumbar vertebra:  Telephone call to patient; not answered, unable to leave message.  Plan: Will follow up.  Sherrin Daisy, RN BSN [place] Management Coordinator [facility] Care Management  [phone]

## 2017-05-24 ENCOUNTER — Other Ambulatory Visit: Payer: Self-pay | Admitting: *Deleted

## 2017-05-24 NOTE — Patient Outreach (Signed)
[facility]) Care Management  06/29/2819  ANNALEIA PENCE [birth year] [id number]  Telephone call attempt x 2; phone rings/no answer; unable to leave message.  Plan: Will follow up.  Sherrin Daisy, RN BSN [place] Management Coordinator [facility] Care Management  [phone]

## 2017-05-28 ENCOUNTER — Other Ambulatory Visit: Payer: Self-pay | Admitting: *Deleted

## 2017-05-28 NOTE — Patient Outreach (Signed)
[facility]) Care Management  12/24/5972  Kara [name] [birth year] [id number]  Telephone call to patient; unable to leave message. Telephone call to alternate number [phone].   Spoke with patient & daughter who was on speaker phone. Patient gave HIPPA verification. Patient states she is currently staying with her daughter but wants  to go back to her home but needs some personal assistance.  States she is having trouble with her back due to osteoporosis. States her bones are brittle.  Daughter Kara [name]) voices that patient is currently living with her but would like to go back to her home but she would need some help with personal care, meal preparation & household chores.  Patient states she is able to give herself sponge bath & dress her self. States she has not had any falls. Falls prevention discussed.   Patient states she uses cane in home but has a walker to use when she goes elsewhere. States she has back brace but does not wear because is causes more pain because it doesn't fit . States she has seen specialist & has another appointment scheduled soon. Patient voices that daughters currently take her to MD appointments. States she manages her own medications & is able to get all of her medications without problems. Voices that she takes medications as instructed by her doctors. States she use pain medications and uses as needed.   Patient states main concern now is getting back to her own home, finding personal assistant that could  help her out several hours during the day. States currently she is not interested in assisted living. States she would be interested in meals on wheels if available.   Plan: Refer to care management assistant to assign to Clinical Social Worker for [employer].  Telephonic signing off.   Kara Daisy, RN BSN CCM Care Management Coordinator [facility] Care Management  [phone]     Referral to Clinical Social worker would be of  value. Patient consents to [place] work services.

## 2017-05-30 ENCOUNTER — Encounter (INDEPENDENT_AMBULATORY_CARE_PROVIDER_SITE_OTHER): Payer: Self-pay | Admitting: Orthopaedic Surgery

## 2017-05-30 ENCOUNTER — Other Ambulatory Visit: Payer: Self-pay | Admitting: *Deleted

## 2017-05-30 ENCOUNTER — Ambulatory Visit (INDEPENDENT_AMBULATORY_CARE_PROVIDER_SITE_OTHER): Payer: Medicare Other | Admitting: Orthopaedic Surgery

## 2017-05-30 ENCOUNTER — Ambulatory Visit: Payer: Medicare Other | Admitting: Orthopaedic Surgery

## 2017-05-30 VITALS — BP 139/66 | HR 79 | Ht 61.0 in | Wt 106.0 lb

## 2017-05-30 DIAGNOSIS — S32030A Wedge compression fracture of third lumbar vertebra, initial encounter for closed fracture: Secondary | ICD-10-CM | POA: Diagnosis not present

## 2017-05-30 DIAGNOSIS — M5442 Lumbago with sciatica, left side: Secondary | ICD-10-CM

## 2017-05-30 DIAGNOSIS — I2510 Atherosclerotic heart disease of native coronary artery without angina pectoris: Secondary | ICD-10-CM | POA: Diagnosis not present

## 2017-05-30 DIAGNOSIS — S32050A Wedge compression fracture of fifth lumbar vertebra, initial encounter for closed fracture: Secondary | ICD-10-CM | POA: Diagnosis not present
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
## 2017-05-30 NOTE — Patient Outreach (Signed)
[facility]) Care Management  01/20/2877  SWAYZIE CHOATE [birth year] [id number]   CSW made an initial attempt to try and contact patient today to perform phone assessment, as well as assess and assist with social needs and services, without success. A HIPPA compliant message was left for patient on voicemail (ph#: [phone]). CSW is currently awaiting a return call. CSW will make a second outreach attempt within the next week, if CSW does not receive a return call from patient in the meantime.    [name], LCSW [facility]  Clinical Social Worker cell #: [phone]

## 2017-05-31 ENCOUNTER — Other Ambulatory Visit: Payer: Self-pay | Admitting: Obstetrics and Gynecology

## 2017-06-03 ENCOUNTER — Other Ambulatory Visit: Payer: Self-pay | Admitting: *Deleted

## 2017-06-03 NOTE — Patient Outreach (Signed)
[facility]) Care Management  5/62/1308  Kara [name] [birth year] [id number]   CSW made second attempt to try to reach patient to perform phone assessment, as well as assess and assist with social needs and services, without success. A HIPPA compliant message was left for patient on voicemail (ph#: [phone]). CSW is currently awaiting a return call. CSW will have CMA mail unsuccessful outreach letter & make a third outreach attempt within the next week, if CSW does not receive a return call from patient in the meantime.    [name], LCSW [facility]  Clinical Social Worker cell #: [phone]

## 2017-06-06 ENCOUNTER — Encounter: Payer: Self-pay | Admitting: Internal Medicine

## 2017-06-11 ENCOUNTER — Other Ambulatory Visit: Payer: Self-pay | Admitting: *Deleted

## 2017-06-11 NOTE — Patient Outreach (Signed)
[facility]) Care Management  02/13/9562  Kara [name] [birth year] [id number]   CSW received call back from patient's daughter, Kara [name] [phone] informing that patient has returned to her home where she is living alone, but her son - Kara [name] lives next door and checks on her. CSW called patient's home and scheduled initial home visit for tomorrow - Wednesday, Aug 22nd at 1pm. Patient requested that [place] call her daughter, Kara [name] as she has been trying to find resources for her as well. CSW spoke with patient's daughter to inform of initial home visit & daughter plans to be present as well. Full assessment & note to follow.    [name], LCSW [facility]  Clinical Social Worker cell #: [phone]

## 2017-06-11 NOTE — Patient Outreach (Signed)
[place] [facility]) Care Management  3/38/2505  Kara [name] [birth year] [id number]   CSW made a third & final attempt to reach patient to perform phone assessment unsuccessfully, CSW left voicemail on [phone].CSW continues to await return call. CSW had CMA mail unsuccessful outreach letter 06/03/17 to patient's home, encouraging patient to contact CSW at their earliest convenience, if patient is interested in receiving social work services through [place] with [employer] If CSW does not receive a return call from patient within the next 10 business days, CSW will proceed with case closure. Required number of phone attempts will have been made and outreach letter mailed.    [name], LCSW [facility]  Clinical Social Worker cell #: [phone]

## 2017-06-12 ENCOUNTER — Encounter: Payer: Self-pay | Admitting: *Deleted

## 2017-06-12 ENCOUNTER — Other Ambulatory Visit: Payer: Self-pay | Admitting: *Deleted

## 2017-06-12 NOTE — Patient Outreach (Signed)
[place] [facility]) Care Management  [facility] Social Work  3/56/7014  DRU LAUREL [birth year] [id number]   Encounter Medications:  Outpatient Encounter Prescriptions as of 06/12/2017  Medication Sig Note  . acetaminophen (TYLENOL) 500 MG tablet Take 1,000 mg by mouth daily as needed for pain. For pain 04/11/2016: Few times weekly   . aspirin 81 MG tablet Take 81 mg by mouth daily.   Marland Kitchen atorvastatin (LIPITOR) 10 MG tablet TAKE 1 TABLET (10 MG TOTAL) BY MOUTH AT BEDTIME.   . clobetasol cream (TEMOVATE) 0.05 % APPLY TOPICALLY 2 TIMES A WEEK. LIGHT USE ONLY   . clonazePAM (KLONOPIN) 0.5 MG tablet TAKE 1/2 TABLET BY MOUTH DAILY AS NEEDED   . diphenhydrAMINE (BENADRYL) 25 MG tablet Take 25 mg by mouth daily.   Marland Kitchen ibuprofen (ADVIL,MOTRIN) 200 MG tablet Take 200 mg by mouth every 6 (six) hours as needed.   . Multiple Vitamins-Minerals (EQ COMPLETE MULTIVITAMIN-ADULT) TABS Take 1 tablet by mouth every morning.    . nitroGLYCERIN (NITROSTAT) 0.4 MG SL tablet Place 1 tablet (0.4 mg total) under the tongue every 5 (five) minutes as needed for chest pain.   Marland Kitchen omeprazole (PRILOSEC) 20 MG capsule TAKE ONE CAPSULE BY MOUTH DAILY   . Wheat Dextrin (BENEFIBER) POWD Take 5 mLs by mouth 2 (two) times daily. Mixes in beverage   . zoledronic acid (RECLAST) 5 MG/100ML SOLN injection Inject 100 mLs (5 mg total) into the vein once.    No facility-administered encounter medications on file as of 06/12/2017.     Functional Status:  In your present state of health, do you have any difficulty performing the following activities: 06/12/2017 10/29/2016  Hearing? Y N  Vision? N N  Difficulty concentrating or making decisions? Y N  Walking or climbing stairs? Y N  Dressing or bathing? N N  Doing errands, shopping? Y N  Some recent data might be hidden    Fall/Depression Screening:  PHQ 2/9 Scores 06/12/2017 05/28/2017 04/25/2017 10/29/2016 08/20/2016 01/26/2015 11/24/2013  PHQ - 2 Score 0 0 0 0 0 0 1  PHQ- 9 Score -  - - - - - 4    Assessment:  CSW had received referral from [place] for [employer]. CSW met with patient & daughters - [name] & [name] at patient's home to review community resources available. Patient informed CSW that she has been living alone in her mobile home since her husband passed away in 10-Jan-1989 and feels comfortable living alone with her son, [name] living next door and checks on her frequently. Patient sometimes stays with her daugthers, most recently - Rhonda and plans to go back to her home once it becomes apparent that she can no longer live alone. Patient reports that she has a compression fracture and has issues bending over. CSW had reviewed community resources and mentioned PACE program which would pick up patient every morning and take her to the [facility] in [place] but patient states that due to her back pain she does not feel that she would be able to tolerate the ride. Patient requested information on getting an in-home aide. CSW provided information to patient & daughters on CAP program and encouraged patient's daughter - [name] to schedule an appointment with Waymond Cera at [facility] [place] to complete CAP program but that patient had already started the process to get Personal Care Services setup and had dropped off application to Dr. Griffin Dakin office - patient has not yet scheduled an  assessment with [employer].   Patient's daughter expressed concerns to CSW about patient ambulating around her mobile home without an assistive device. Patient states that she had gotten a rollator & rolling walker but it is too wide to get around the furniture in her mobile home. Patient's daughters also expressed that they would like to get a transport wheelchair to help get patient around when they go to doctors appointments & shopping. CSW called & spoke with Nevin Bloodgood at [facility] where patient had gotten the rollator from to  confirm that they would be able to return rollator and get a smaller "junior" rolling walker & a transport wheelchair but that they would need to get an order from her PCP - patient's daughter, [name] states that she will request an order for one when she takes her mother to her next appointment on 06/26/17.   Plan:   CSW will follow-up with patient in 3 weeks to confirm that patient was able to obtain junior walker & transport wheelchair & return rollator. CSW will also ensure that patient's daughter will have been able to meet with Juliann Pulse at [facility] to complete CAP application.   [facility] CM Care Plan Problem One     Most Recent Value  Care Plan Problem One  Patient with financial limitations and in need of community resources.  Role Documenting the Problem One  Clinical Social Worker  Care Plan for Problem One  Active  [facility] Long Term Goal    Patient will be linked with community resources as identified.  [facility] Long Term Goal Start Date  06/12/17  Interventions for Problem One Long Term Goal  CSW provided patient & daughters with resources for [facility] [place] and their programs for community resources  [facility] CM Short Term Goal #1   Patient/daughter, [name] to complete CAP application at [facility] [place] within 30 days  [facility] CM Short Term Goal #1 Start Date  06/12/17  Interventions for Short Term Goal #1  CSW encouraged daughter to setup an appointment with Juliann Pulse at [facility] to complete CAP application       [name], [place] Social Worker cell #: [phone]

## 2017-06-18 ENCOUNTER — Telehealth: Payer: Self-pay | Admitting: Family Medicine

## 2017-06-18 DIAGNOSIS — M810 Age-related osteoporosis without current pathological fracture: Secondary | ICD-10-CM | POA: Diagnosis not present

## 2017-06-18 DIAGNOSIS — I10 Essential (primary) hypertension: Secondary | ICD-10-CM | POA: Diagnosis not present

## 2017-06-18 DIAGNOSIS — E785 Hyperlipidemia, unspecified: Secondary | ICD-10-CM | POA: Diagnosis not present

## 2017-06-18 NOTE — Telephone Encounter (Signed)
Patient's daughter [name] came by requesting order for transport wheelchair and a junior rolling walker. This particular request was written on correspondence with [place] card attached.  [name] LCSW has been assigned to her. She is also trying to get her set up for [employer]  (she is on a 14-16 month wait list as of 07-12-17)  Please let [name] know when this order has been placed.  [phone]

## 2017-06-19 LAB — LIPID PANEL
Cholesterol: 161 mg/dL (ref <200)
HDL: 64 mg/dL (ref >50)
LDL CALC: 68 mg/dL (ref <100)
TRIGLYCERIDES: 146 mg/dL (ref <150)
Total CHOL/HDL Ratio: 2.5 Ratio (ref <5.0)
VLDL: 29 mg/dL (ref <30)

## 2017-06-19 LAB — HEPATIC FUNCTION PANEL
ALBUMIN: 4 g/dL (ref 3.6–5.1)
ALK PHOS: 67 U/L (ref 33–130)
ALT: 12 U/L (ref 6–29)
AST: 21 U/L (ref 10–35)
BILIRUBIN DIRECT: 0.1 mg/dL (ref <=0.2)
BILIRUBIN TOTAL: 0.4 mg/dL (ref 0.2–1.2)
Indirect Bilirubin: 0.3 mg/dL (ref 0.2–1.2)
Total Protein: 6.8 g/dL (ref 6.1–8.1)

## 2017-06-19 MED ORDER — UNABLE TO FIND: 0 refills | Status: DC

## 2017-06-19 NOTE — Telephone Encounter (Signed)
Requested rx's printed and ready for collection

## 2017-06-21 LAB — VITAMIN D 1,25 DIHYDROXY
VITAMIN D 1, 25 (OH) TOTAL: 30 pg/mL (ref 18–72)
Vitamin D2 1, 25 (OH)2: <8 pg/mL
Vitamin D3 1, 25 (OH)2: 30 pg/mL

## 2017-06-26 ENCOUNTER — Encounter: Payer: Self-pay | Admitting: Family Medicine

## 2017-06-26 ENCOUNTER — Ambulatory Visit (INDEPENDENT_AMBULATORY_CARE_PROVIDER_SITE_OTHER): Payer: Medicare Other | Admitting: Family Medicine

## 2017-06-26 VITALS — BP 130/56 | HR 74 | Temp 98.0°F | Resp 18 | Ht 61.0 in | Wt 107.2 lb

## 2017-06-26 DIAGNOSIS — S32030D Wedge compression fracture of third lumbar vertebra, subsequent encounter for fracture with routine healing: Secondary | ICD-10-CM | POA: Diagnosis not present

## 2017-06-26 DIAGNOSIS — I2510 Atherosclerotic heart disease of native coronary artery without angina pectoris: Secondary | ICD-10-CM | POA: Diagnosis not present

## 2017-06-26 DIAGNOSIS — F411 Generalized anxiety disorder: Secondary | ICD-10-CM

## 2017-06-26 DIAGNOSIS — M5431 Sciatica, right side: Secondary | ICD-10-CM

## 2017-06-26 DIAGNOSIS — E785 Hyperlipidemia, unspecified: Secondary | ICD-10-CM | POA: Diagnosis not present

## 2017-06-26 DIAGNOSIS — Z23 Encounter for immunization: Secondary | ICD-10-CM

## 2017-06-26 DIAGNOSIS — K219 Gastro-esophageal reflux disease without esophagitis: Secondary | ICD-10-CM | POA: Diagnosis not present

## 2017-06-26 NOTE — Patient Instructions (Signed)
F/u in January, call if you need me before   We will work on form for CAP help and send to [facility]  Please be careful not to fall.  All the very best!!  Thank you  for choosing [facility]. We consider it a privelige to serve you.  Delivering excellent health care in a caring and  compassionate way is our goal.  Partnering with you,  so that together we can achieve this goal is our strategy.

## 2017-06-27 ENCOUNTER — Ambulatory Visit (INDEPENDENT_AMBULATORY_CARE_PROVIDER_SITE_OTHER): Payer: Medicare Other | Admitting: Orthopaedic Surgery

## 2017-06-27 ENCOUNTER — Ambulatory Visit: Payer: Medicare Other | Admitting: *Deleted

## 2017-06-27 ENCOUNTER — Ambulatory Visit (INDEPENDENT_AMBULATORY_CARE_PROVIDER_SITE_OTHER): Payer: Medicare Other

## 2017-06-27 ENCOUNTER — Encounter (INDEPENDENT_AMBULATORY_CARE_PROVIDER_SITE_OTHER): Payer: Self-pay | Admitting: Orthopaedic Surgery

## 2017-06-27 DIAGNOSIS — S32030D Wedge compression fracture of third lumbar vertebra, subsequent encounter for fracture with routine healing: Secondary | ICD-10-CM | POA: Diagnosis not present

## 2017-06-27 DIAGNOSIS — S32050D Wedge compression fracture of fifth lumbar vertebra, subsequent encounter for fracture with routine healing: Secondary | ICD-10-CM

## 2017-06-27 DIAGNOSIS — I2510 Atherosclerotic heart disease of native coronary artery without angina pectoris: Secondary | ICD-10-CM
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
## 2017-07-01 ENCOUNTER — Encounter: Payer: Self-pay | Admitting: Family Medicine

## 2017-07-01 NOTE — Assessment & Plan Note (Signed)
Controlled, no change in medication  

## 2017-07-01 NOTE — Assessment & Plan Note (Signed)
Increased pain and debility following recent  Fracture, transport wheelchair required for safe transport

## 2017-07-01 NOTE — Progress Notes (Signed)
   Kara [name]     MRN: [id number]      DOB: 23-Nov-1926   HPI Kara [name] is here for follow up and re-evaluation of chronic medical conditions, medication management and review of any available recent lab and radiology data.  She is also here for a face to face evaluation to justify her  need for a transport  Wheelchair Kara [name] suffers from severe osteoarthritis of the spine with established disc disease which limits her ability to walk for more than 10 minutes without pain and significant  fall risk. She also has severe osteoporosis and has recently suffered a very disabling fracture of her spine which makes any movement even more painful. For safe mobility and comfort both for the patient and her family, a transport wheelchair is therefore   necessity . Preventive health is updated, specifically   Immunization.   Questions or concerns regarding consultations or procedures which the PT has had in the interim are  addressed.Has upcoming f/u with orthopedics The PT denies any adverse reactions to current medications since the last visit.   ROS Denies recent fever or chills. Denies sinus pressure, nasal congestion, ear pain or sore throat. Denies chest congestion, productive cough or wheezing. Denies chest pains, palpitations and leg swelling Denies abdominal pain, nausea, vomiting,diarrhea or constipation.   Denies dysuria, frequency, hesitancy or incontinence.  Denies headaches, seizures, numbness, or tingling. Denies depression, anxiety or insomnia. Denies skin break down or rash.   PE  BP (!) 130/56   Pulse 74   Temp 98 F (36.7 C) (Other (Comment))   Resp 18   Ht 5\' 1"  (1.549 m)   Wt 107 lb 4 oz (48.6 kg)   SpO2 98%   BMI 20.26 kg/m   Patient alert and oriented and in no cardiopulmonary distress.  HEENT: No facial asymmetry, EOMI,   oropharynx pink and moist.  Neck supple no JVD, no mass.  Chest: Clear to auscultation bilaterally.  CVS: S1, S2 no murmurs, no  S3.Regular rate.  ABD: Soft non tender.   Ext: No edema  Kara: Decreased  ROM spine, shoulders, hips and knees.  Skin: Intact, no ulcerations or rash noted.  Psych: Good eye contact, normal affect. Memory intact not anxious or depressed appearing.  CNS: CN 2-12 intact, power,  normal throughout.no focal deficits noted.   Assessment & Plan  Hyperlipidemia LDL goal <100 Controlled, no change in medication Hyperlipidemia:Low fat diet discussed and encouraged.   Lipid Panel  Lab Results  Component Value Date   CHOL 161 06/18/2017   HDL 64 06/18/2017   LDLCALC 68 06/18/2017   TRIG 146 06/18/2017   CHOLHDL 2.5 06/18/2017       Sciatica of right side Uncontrolled chronic pain with high fall risk, needs transport wheelchair for safe transport outside of the home   GENERALIZED ANXIETY DISORDER Controlled, no change in medication   GERD (gastroesophageal reflux disease) Controlled, no change in medication   Closed compression fracture of L3 lumbar vertebra (HCC) Increased pain and debility following recent  Fracture, transport wheelchair required for safe transport

## 2017-07-01 NOTE — Assessment & Plan Note (Signed)
Uncontrolled chronic pain with high fall risk, needs transport wheelchair for safe transport outside of the home

## 2017-07-01 NOTE — Assessment & Plan Note (Signed)
Controlled, no change in medication Hyperlipidemia:Low fat diet discussed and encouraged.   Lipid Panel  Lab Results  Component Value Date   CHOL 161 06/18/2017   HDL 64 06/18/2017   LDLCALC 68 06/18/2017   TRIG 146 06/18/2017   CHOLHDL 2.5 06/18/2017

## 2017-07-03 ENCOUNTER — Other Ambulatory Visit: Payer: Self-pay | Admitting: *Deleted

## 2017-07-03 ENCOUNTER — Ambulatory Visit: Payer: Self-pay | Admitting: *Deleted

## 2017-07-03 NOTE — Patient Outreach (Signed)
[facility]) Care Management  2/33/6122  YATZARY MERRIWEATHER [birth year] [id number]   CSW called & spoke with patient to follow-up on equipment - patient states that she went to Dr. Griffin Dakin office on 06/26/17 and got a prescription for a transport wheelchair but had not received it yet. CSW called & spoke with patient's daughters [name] [phone] & [name] - [phone] to follow-up. Per Dr. Griffin Dakin note, they will work on form for CAP. CSW left voicemail for patient's daughter, [name] to see if she was able to go by [facility] [place] to complete application for CAP.   CSW will follow-up in 2 weeks.    [name], LCSW [facility]  Clinical Social Worker cell #: [phone]

## 2017-07-04 ENCOUNTER — Telehealth: Payer: Self-pay | Admitting: Family Medicine

## 2017-07-04 NOTE — Telephone Encounter (Signed)
Pls explain increased risk of fall and forgetfulness with increased dose , she is getting accustomed to the med an dependent which I know she does not want so needs to stay on old dose

## 2017-07-04 NOTE — Telephone Encounter (Signed)
Patient is requesting to take 1 whole clonazepam rather than a 1/2, she says 1/2 isnt helping her anymore. She has been taking a whole one so she is almost out of medication, she has refills at the pharmacy however they will not refill until the 28th due to instructions to take 1/2.  Cb#[phone]

## 2017-07-05 ENCOUNTER — Other Ambulatory Visit: Payer: Self-pay | Admitting: Family Medicine

## 2017-07-08 ENCOUNTER — Telehealth: Payer: Self-pay | Admitting: Family Medicine

## 2017-07-08 NOTE — Telephone Encounter (Signed)
Disregard last note.  A letter is not being sent.

## 2017-07-08 NOTE — Telephone Encounter (Signed)
Patient is requesting enough clonazepam  to last until the 07-19-17.  She had already started taking whole tablets instead of 1/2 and now she is running short.  In summary, she is requesting 8 tablets to last her until the auto refills.  [facility]

## 2017-07-08 NOTE — Telephone Encounter (Signed)
I have been unable to reach this patient by phone.  A letter is being sent.

## 2017-07-08 NOTE — Telephone Encounter (Signed)
Called patient, see previous tele message.

## 2017-07-10 ENCOUNTER — Telehealth: Payer: Self-pay | Admitting: Family Medicine

## 2017-07-10 NOTE — Telephone Encounter (Signed)
Daughter would like to discuss the denial on the Rx. This is effecting patient's sleep.  She does not think that you understand why this Rx is needed.  cb  [phone]

## 2017-07-10 NOTE — Telephone Encounter (Signed)
Please advise. This patient was doubling the amount of clonazepam she was taking, without Dr approval, and ran out early. She asked for increase in dosage, but Dr. Moshe Cipro denied because of the effects of the medication. She now wants coverage for the 8 days she will be without medication.

## 2017-07-11 MED ORDER — CLONAZEPAM 0.5 MG PO TABS: ORAL_TABLET | ORAL | 0 refills | Status: DC

## 2017-07-11 NOTE — Telephone Encounter (Signed)
Please explain to patient or daughter if authorized that the medicine is prescribed they way it is for patient's safety.  Doubling the dose puts her at increased risk of complications, such as falls.  I reviewed the chart and understand that she is uncomfortable, but cannot authorize the dose she desires.  I will give her 4 pills, to take one half a day as she is supposed to, until Dr Moshe Cipro can address.

## 2017-07-11 NOTE — Telephone Encounter (Signed)
Spoke to daughter. rx faxed.

## 2017-07-11 NOTE — Telephone Encounter (Signed)
Called [facility], line busy.

## 2017-07-11 NOTE — Telephone Encounter (Signed)
Left message for daughter to call office

## 2017-07-15 DIAGNOSIS — S32030K Wedge compression fracture of third lumbar vertebra, subsequent encounter for fracture with nonunion: Secondary | ICD-10-CM | POA: Diagnosis not present

## 2017-07-15 DIAGNOSIS — X58XXXA Exposure to other specified factors, initial encounter: Secondary | ICD-10-CM | POA: Diagnosis not present

## 2017-07-15 DIAGNOSIS — S32000K Wedge compression fracture of unspecified lumbar vertebra, subsequent encounter for fracture with nonunion: Secondary | ICD-10-CM | POA: Diagnosis not present

## 2017-07-15 DIAGNOSIS — M545 Low back pain: Secondary | ICD-10-CM | POA: Diagnosis not present

## 2017-07-15 DIAGNOSIS — S73101A Unspecified sprain of right hip, initial encounter: Secondary | ICD-10-CM | POA: Diagnosis not present

## 2017-07-15 DIAGNOSIS — S32050K Wedge compression fracture of fifth lumbar vertebra, subsequent encounter for fracture with nonunion: Secondary | ICD-10-CM | POA: Diagnosis not present

## 2017-07-15 DIAGNOSIS — M25551 Pain in right hip: Secondary | ICD-10-CM | POA: Diagnosis not present

## 2017-07-17 ENCOUNTER — Other Ambulatory Visit: Payer: Self-pay | Admitting: *Deleted

## 2017-07-17 NOTE — Patient Outreach (Signed)
[facility]) Care Management  5/43/6067  Kara [name] [birth year] [id number]   CSW attempted to reach patient to follow-up on community resources & durable medical equipment but no answer (ph#: [phone]), just kept ringing. CSW will try again next week to follow-up.    [name], LCSW [facility]  Clinical Social Worker cell #: [phone]

## 2017-07-22 ENCOUNTER — Telehealth: Payer: Self-pay | Admitting: *Deleted

## 2017-07-22 DIAGNOSIS — M5441 Lumbago with sciatica, right side: Secondary | ICD-10-CM | POA: Diagnosis not present

## 2017-07-22 DIAGNOSIS — K219 Gastro-esophageal reflux disease without esophagitis: Secondary | ICD-10-CM | POA: Diagnosis not present

## 2017-07-22 DIAGNOSIS — E7800 Pure hypercholesterolemia, unspecified: Secondary | ICD-10-CM | POA: Diagnosis not present

## 2017-07-22 DIAGNOSIS — M25551 Pain in right hip: Secondary | ICD-10-CM | POA: Diagnosis not present

## 2017-07-22 DIAGNOSIS — M5431 Sciatica, right side: Secondary | ICD-10-CM | POA: Diagnosis not present

## 2017-07-22 DIAGNOSIS — Z79899 Other long term (current) drug therapy: Secondary | ICD-10-CM | POA: Diagnosis not present

## 2017-07-22 NOTE — Telephone Encounter (Signed)
Daughter [name] called & stated that patient had to go to E.R. Diagnosis pulled muscle in back. This happened on yesterday patient's 46 th  Birthday [birth year]. [name] the daughter would like to know if what they need to do ? She is requesting a office visit stating that patient is in so much pain that she is trembling.

## 2017-07-23 ENCOUNTER — Telehealth: Payer: Self-pay | Admitting: *Deleted

## 2017-07-23 ENCOUNTER — Other Ambulatory Visit: Payer: Self-pay | Admitting: Family Medicine

## 2017-07-23 ENCOUNTER — Telehealth: Payer: Self-pay | Admitting: Family Medicine

## 2017-07-23 DIAGNOSIS — M549 Dorsalgia, unspecified: Secondary | ICD-10-CM

## 2017-07-23 NOTE — Telephone Encounter (Signed)
Kara [name]  daughter # [phone] called again this AM. Patient had to be taken to E.R. Last night due to severe pain And muscle spasms. E.R. doctor suggested that they call PCP & request a referral to a Neurologist for a nerve  conduction study. Requesting  [facility] location.  Daughter asking for a returned call may contact other daughter as well  [name] [phone].

## 2017-07-23 NOTE — Telephone Encounter (Signed)
pls refer to neurologist for nerve conduction study evaluate nervwe pain, they want [facility], not sure there is anyone there, I know of Dr Merlene Laughter in [place], if they do not want him, then theymay need to go to [place], pls call them and find where they want  To go, generic referral entered Also I recommend she f/u with the ortho Doc who has been treatimng her fot hte chronic back pain also, sorry to hear that her back poain has flare d up like this again

## 2017-07-23 NOTE — Telephone Encounter (Signed)
I spoke with Kara [name] states 1 week ago  Mom had acute back pain radaiting down right hip  and leg, was seen in ED 1 week ago  And again  Last night, no on robaxin needs to see neurology,ee with dr Merlene Laughter

## 2017-07-24 DIAGNOSIS — M8000XD Age-related osteoporosis with current pathological fracture, unspecified site, subsequent encounter for fracture with routine healing: Secondary | ICD-10-CM | POA: Diagnosis not present

## 2017-07-24 DIAGNOSIS — M5416 Radiculopathy, lumbar region: Secondary | ICD-10-CM | POA: Diagnosis not present

## 2017-07-24 DIAGNOSIS — M79604 Pain in right leg: Secondary | ICD-10-CM | POA: Diagnosis not present

## 2017-07-24 DIAGNOSIS — M25551 Pain in right hip: Secondary | ICD-10-CM | POA: Diagnosis not present

## 2017-07-24 DIAGNOSIS — Z79891 Long term (current) use of opiate analgesic: Secondary | ICD-10-CM | POA: Diagnosis not present

## 2017-07-24 DIAGNOSIS — G894 Chronic pain syndrome: Secondary | ICD-10-CM | POA: Diagnosis not present

## 2017-07-24 DIAGNOSIS — M545 Low back pain: Secondary | ICD-10-CM | POA: Diagnosis not present

## 2017-07-24 NOTE — Telephone Encounter (Signed)
I spoke with Kara [name] yesterday

## 2017-07-26 ENCOUNTER — Other Ambulatory Visit: Payer: Self-pay | Admitting: *Deleted

## 2017-07-26 NOTE — Patient Outreach (Signed)
[facility]) Care Management  10/27/6151  JAQUISHA FRECH [birth year] [id number]   CSW attempted for a second time to reach patient to discuss community resources but no answer (home #: [phone]). CSW will attempt again next week to reach patient or daughters.    [name], LCSW [facility]  Clinical Social Worker cell #: [phone]

## 2017-07-30 ENCOUNTER — Other Ambulatory Visit: Payer: Self-pay | Admitting: Family Medicine

## 2017-07-30 ENCOUNTER — Telehealth: Payer: Self-pay | Admitting: Family Medicine

## 2017-07-30 NOTE — Telephone Encounter (Signed)
Patient's daughter [name] calling. Patient was put on generic robaxin 750mg  3x per day after a visit to [facility] and she has been taking them since 1 to 2 times a day.  Daughter would like to know if you can continue the Rx (even if lower dose is written, she is fine with that because they really seem to be helping her with pain).  Patient did see Dr. Merlene Laughter last week.    please call in [facility] in [place] ([address])  Patient only has 2 left.

## 2017-07-30 NOTE — Telephone Encounter (Signed)
Since Dr Merlene Laughter is actually treating her pain , I recommend that Kara [name] get the robaxin prescribed by him so that there is less risk of overmedication and duplication of medication, pls explain and let her know

## 2017-07-31 NOTE — Telephone Encounter (Signed)
Called patient regarding message below. No answer, left generic message for patient to return call.   

## 2017-08-01 ENCOUNTER — Ambulatory Visit: Payer: Medicare Other | Admitting: Family Medicine

## 2017-08-02 ENCOUNTER — Ambulatory Visit (INDEPENDENT_AMBULATORY_CARE_PROVIDER_SITE_OTHER): Payer: Medicare Other | Admitting: Orthopaedic Surgery

## 2017-08-02 ENCOUNTER — Encounter (INDEPENDENT_AMBULATORY_CARE_PROVIDER_SITE_OTHER): Payer: Self-pay | Admitting: Orthopaedic Surgery

## 2017-08-02 ENCOUNTER — Other Ambulatory Visit: Payer: Self-pay | Admitting: *Deleted

## 2017-08-02 VITALS — BP 140/67 | HR 73 | Ht 61.0 in | Wt 108.0 lb

## 2017-08-02 DIAGNOSIS — M7061 Trochanteric bursitis, right hip: Secondary | ICD-10-CM | POA: Diagnosis not present

## 2017-08-02 DIAGNOSIS — M25551 Pain in right hip: Secondary | ICD-10-CM
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
## 2017-08-02 NOTE — Patient Outreach (Signed)
[facility]) Care Management  29/29/0903  DALYLA CHUI [birth year] [id number]   CSW attempted for a third time to reach patient to discuss community resources but no answer (home #: [phone]). CSW noted that patient had an appointment with Dr. Lorin Mercy today in [place] & will try to reach patient again next week.    [name], LCSW [facility]  Clinical Social Worker cell #: [phone]

## 2017-08-07 ENCOUNTER — Telehealth: Payer: Self-pay | Admitting: Family Medicine

## 2017-08-07 NOTE — Telephone Encounter (Signed)
Patient was not seen last week. Tried to return call, no answer. Need to know which medications need to be refilled.

## 2017-08-07 NOTE — Telephone Encounter (Signed)
Vito Backers, called and left msg that patients medications were not refilled last week after appt. Cb#: [phone]

## 2017-08-09 ENCOUNTER — Other Ambulatory Visit: Payer: Self-pay | Admitting: *Deleted

## 2017-08-09 ENCOUNTER — Other Ambulatory Visit: Payer: Self-pay | Admitting: Family Medicine

## 2017-08-09 ENCOUNTER — Telehealth: Payer: Self-pay | Admitting: Family Medicine

## 2017-08-09 MED ORDER — METHOCARBAMOL 750 MG PO TABS: ORAL_TABLET | ORAL | 1 refills | Status: DC

## 2017-08-09 NOTE — Telephone Encounter (Signed)
Patient's daughter [name] is called regarding patient. She states that the patient had a cortisone injection at [facility] and it has not helped. She states the muscle relaxer given in the Ed helped- wants to know if she can have refills.

## 2017-08-09 NOTE — Patient Outreach (Signed)
[facility]) Care Management  79/43/2761  Kara [name] [phone]   CSW called & spoke with patient's daughter, Kara [name] to discuss community resources for patient. Daughter states that they had gotten all the way to needing to pick out a sitting through an agency for [facility] and patient decided that she would rather not have a stranger come into her home. Patient is now back to living with her daughter, Kara [name] and between [name] & their brother - they are splitting up the care for patient. Patient's daughter states that she does not have any more needs for social work from this standpoint.   CSW will perform a case closure on patient, as all goals of treatment have been met from social work standpoint and no additional social work needs have been identified at this time.   CSW will fax an update to patient's Primary Care Physician, Dr. [name] to ensure that they are aware of CSW's involvement with patient's plan of care. CSW will submit a case closure request to [name], Care Management Assistant with [name] in the form of an inbasket message.    [name], LCSW [facility]  Clinical Social Worker cell #: [phone]

## 2017-08-09 NOTE — Telephone Encounter (Signed)
I spoke directly with Kara [name] and have prescribed robaxin with 1 refill, however, dr Merlene Laughter is responsible for her pain management , and I will send him a message sasking that he take responsibility for her care moving forward

## 2017-08-22 DIAGNOSIS — M8000XD Age-related osteoporosis with current pathological fracture, unspecified site, subsequent encounter for fracture with routine healing: Secondary | ICD-10-CM | POA: Diagnosis not present

## 2017-08-22 DIAGNOSIS — M5415 Radiculopathy, thoracolumbar region: Secondary | ICD-10-CM | POA: Diagnosis not present

## 2017-08-22 DIAGNOSIS — M545 Low back pain: Secondary | ICD-10-CM | POA: Diagnosis not present

## 2017-08-22 DIAGNOSIS — M79604 Pain in right leg: Secondary | ICD-10-CM | POA: Diagnosis not present

## 2017-09-09 ENCOUNTER — Encounter: Payer: Self-pay | Admitting: Family Medicine

## 2017-09-09 ENCOUNTER — Ambulatory Visit (INDEPENDENT_AMBULATORY_CARE_PROVIDER_SITE_OTHER): Payer: Medicare Other | Admitting: Family Medicine

## 2017-09-09 VITALS — BP 144/62 | HR 77 | Resp 16 | Ht 61.0 in | Wt 111.0 lb

## 2017-09-09 DIAGNOSIS — I2510 Atherosclerotic heart disease of native coronary artery without angina pectoris: Secondary | ICD-10-CM | POA: Diagnosis not present

## 2017-09-09 DIAGNOSIS — F411 Generalized anxiety disorder: Secondary | ICD-10-CM | POA: Diagnosis not present

## 2017-09-09 DIAGNOSIS — R7302 Impaired glucose tolerance (oral): Secondary | ICD-10-CM

## 2017-09-09 DIAGNOSIS — E785 Hyperlipidemia, unspecified: Secondary | ICD-10-CM | POA: Diagnosis not present

## 2017-09-09 DIAGNOSIS — S32030S Wedge compression fracture of third lumbar vertebra, sequela: Secondary | ICD-10-CM

## 2017-09-09 MED ORDER — CLONAZEPAM 0.5 MG PO TABS: ORAL_TABLET | ORAL | 1 refills | Status: DC

## 2017-09-09 NOTE — Patient Instructions (Addendum)
F/u in 4 month, call if you need me before  [place] AS DISCUSSED  fASTING LIPID, CMP AND tsh 1 WEK BEFORE FOLLOW  UP    Fall Prevention in the Home Falls can cause injuries. They can happen to people of all ages. There are many things you can do to make your home safe and to help prevent falls. What can I do on the outside of my home?  Regularly fix the edges of walkways and driveways and fix any cracks.  Remove anything that might make you trip as you walk through a door, such as a raised step or threshold.  Trim any bushes or trees on the path to your home.  Use bright outdoor lighting.  Clear any walking paths of anything that might make someone trip, such as rocks or tools.  Regularly check to see if handrails are loose or broken. Make sure that both sides of any steps have handrails.  Any raised decks and porches should have guardrails on the edges.  Have any leaves, snow, or ice cleared regularly.  Use sand or salt on walking paths during winter.  Clean up any spills in your garage right away. This includes oil or grease spills. What can I do in the bathroom?  Use night lights.  Install grab bars by the toilet and in the tub and shower. Do not use towel bars as grab bars.  Use non-skid mats or decals in the tub or shower.  If you need to sit down in the shower, use a plastic, non-slip stool.  Keep the floor dry. Clean up any water that spills on the floor as soon as it happens.  Remove soap buildup in the tub or shower regularly.  Attach bath mats securely with double-sided non-slip rug tape.  Do not have throw rugs and other things on the floor that can make you trip. What can I do in the bedroom?  Use night lights.  Make sure that you have a light by your bed that is easy to reach.  Do not use any sheets or blankets that are too big for your bed. They should not hang down onto the  floor.  Have a firm chair that has side arms. You can use this for support while you get dressed.  Do not have throw rugs and other things on the floor that can make you trip. What can I do in the kitchen?  Clean up any spills right away.  Avoid walking on wet floors.  Keep items that you use a lot in easy-to-reach places.  If you need to reach something above you, use a strong step stool that has a grab bar.  Keep electrical cords out of the way.  Do not use floor polish or wax that makes floors slippery. If you must use wax, use non-skid floor wax.  Do not have throw rugs and other things on the floor that can make you trip. What can I do with my stairs?  Do not leave any items on the stairs.  Make sure that there are handrails on both sides of the stairs and use them. Fix handrails that are broken or loose. Make sure that handrails are as long as the stairways.  Check any carpeting to make sure that it is firmly attached to the stairs. Fix any carpet that is loose or worn.  Avoid having throw rugs at the top or  bottom of the stairs. If you do have throw rugs, attach them to the floor with carpet tape.  Make sure that you have a light switch at the top of the stairs and the bottom of the stairs. If you do not have them, ask someone to add them for you. What else can I do to help prevent falls?  Wear shoes that: ? Do not have high heels. ? Have rubber bottoms. ? Are comfortable and fit you well. ? Are closed at the toe. Do not wear sandals.  If you use a stepladder: ? Make sure that it is fully opened. Do not climb a closed stepladder. ? Make sure that both sides of the stepladder are locked into place. ? Ask someone to hold it for you, if possible.  Clearly mark and make sure that you can see: ? Any grab bars or handrails. ? First and last steps. ? Where the edge of each step is.  Use tools that help you move around (mobility aids) if they are needed. These  include: ? Canes. ? Walkers. ? Scooters. ? Crutches.  Turn on the lights when you go into a dark area. Replace any light bulbs as soon as they burn out.  Set up your furniture so you have a clear path. Avoid moving your furniture around.  If any of your floors are uneven, fix them.  If there are any pets around you, be aware of where they are.  Review your medicines with your doctor. Some medicines can make you feel dizzy. This can increase your chance of falling. Ask your doctor what other things that you can do to help prevent falls. This information is not intended to replace advice given to you by your health care provider. Make sure you discuss any questions you have with your health care provider. Document Released: 08/04/2009 Document Revised: 03/15/2016 Document Reviewed: 11/12/2014 Elsevier Interactive Patient Education  [employer].

## 2017-09-10 NOTE — Assessment & Plan Note (Signed)
Healing slowly over time, continue pain management with Dr Merlene Laughter and will continue to follow with orthopedics until released Pt is in less pain and is more functional

## 2017-09-10 NOTE — Assessment & Plan Note (Signed)
Patient educated about the importance of limiting  Carbohydrate intake , the need to commit to daily physical activity for a minimum of 30 minutes , and to commit weight loss. The fact that changes in all these areas will reduce or eliminate all together the development of diabetes is stressed.   Diabetic Labs Latest Ref Rng & Units 06/18/2017 03/20/2017 11/26/2016 03/15/2016 07/07/2015  HbA1c <5.7 % - - 5.5 5.8(H) -  Chol <200 mg/dL 161 - 150 169 -  HDL >50 mg/dL 64 - 63 72 -  Calc LDL <100 mg/dL 68 - 64 78 -  Triglycerides <150 mg/dL 146 - 117 93 -  Creatinine 0.44 - 1.00 mg/dL - 0.60 - 0.69 0.57(L)   BP/Weight 09/09/2017 08/02/2017 06/27/2017 06/26/2017 05/30/2017 05/28/2017 0/97/3532  Systolic BP 992 426 834 196 222 - 979  Diastolic BP 62 67 72 56 66 - 67  Wt. (Lbs) 111 108 - 107.25 106 107 107  BMI 20.97 20.41 - 20.26 20.03 20.22 20.22   No flowsheet data found.  Updated lab needed at/ before next visit.

## 2017-09-10 NOTE — Progress Notes (Signed)
Kara [name]     MRN: [id number]      DOB: Sep 07, 1927   HPI Kara [name] is here for follow up and re-evaluation of chronic medical conditions, medication management and review of any available recent lab and radiology data.  Preventive health is updated, specifically  Cancer screening and Immunization.   Questions or concerns regarding consultations or procedures which the PT has had in the interim are  addressed. The PT denies any adverse reactions to current medications since the last visit.  Main concern is request to allow a few addiional klonopin tablets per month to help with sleeep when needed   ROS Denies recent fever or chills. Denies sinus pressure, nasal congestion, ear pain or sore throat. Denies chest congestion, productive cough or wheezing. Denies chest pains, palpitations and leg swelling Denies abdominal pain, nausea, vomiting,diarrhea or constipation.   Denies dysuria, frequency, hesitancy or incontinence. C/o  joint pain, and limitation in mobility.States this is improving Denies headaches, seizures, numbness, or tingling. Denies depression, c/o increased anxiety and  Insomnia at times Denies skin break down or rash.   PE  BP (!) 144/62   Pulse 77   Resp 16   Ht 5\' 1"  (1.549 m)   Wt 111 lb (50.3 kg)   SpO2 97%   BMI 20.97 kg/m   Patient alert and oriented and in no cardiopulmonary distress.  HEENT: No facial asymmetry, EOMI,   oropharynx pink and moist.  Neck supple no JVD, no mass.  Chest: Clear to auscultation bilaterally.  CVS: S1, S2 no murmurs, no S3.Regular rate.  ABD: Soft non tender.   Ext: No edema  FT:DDUKGURKY ROM spine, shoulders, hips and knees.  Skin: Intact, no ulcerations or rash noted.  Psych: Good eye contact, normal affect. Memory intact not anxious or depressed appearing.  CNS: CN 2-12 intact, power,  normal throughout.no focal deficits noted.   Assessment & Plan  GENERALIZED ANXIETY DISORDER Increased and  uncontrolled, pt and family member, her daughter who accompanies her , Kara [name], requests an extra 5 tablets of klonopin per month , as pt sometimes requires half tablet additional for sleep, request is not excessive and is grated with a 4 month follow up Discussed increased oversight need for falls and increased sedation, hence caution with overuse and possibility;lity of dependence, pt herself has a very robust and healthy respect for all medication  IGT (impaired glucose tolerance) Patient educated about the importance of limiting  Carbohydrate intake , the need to commit to daily physical activity for a minimum of 30 minutes , and to commit weight loss. The fact that changes in all these areas will reduce or eliminate all together the development of diabetes is stressed.   Diabetic Labs Latest Ref Rng & Units 06/18/2017 03/20/2017 11/26/2016 03/15/2016 07/07/2015  HbA1c <5.7 % - - 5.5 5.8(H) -  Chol <200 mg/dL 161 - 150 169 -  HDL >50 mg/dL 64 - 63 72 -  Calc LDL <100 mg/dL 68 - 64 78 -  Triglycerides <150 mg/dL 146 - 117 93 -  Creatinine 0.44 - 1.00 mg/dL - 0.60 - 0.69 0.57(L)   BP/Weight 09/09/2017 08/02/2017 06/27/2017 06/26/2017 05/30/2017 05/28/2017 04/27/2375  Systolic BP 283 151 761 607 371 - 062  Diastolic BP 62 67 72 56 66 - 67  Wt. (Lbs) 111 108 - 107.25 106 107 107  BMI 20.97 20.41 - 20.26 20.03 20.22 20.22   No flowsheet data found.  Updated lab needed at/ before next visit.  Hyperlipidemia LDL goal <100 Hyperlipidemia:Low fat diet discussed and encouraged.   Lipid Panel  Lab Results  Component Value Date   CHOL 161 06/18/2017   HDL 64 06/18/2017   LDLCALC 68 06/18/2017   TRIG 146 06/18/2017   CHOLHDL 2.5 06/18/2017   Controlled, no change in medication Updated lab needed at/ before next visit.     Closed compression fracture of L3 lumbar vertebra ([facility]CC) Healing slowly over time, continue pain management with Dr Merlene Laughter and will continue to follow with orthopedics until  released Pt is in less pain and is more functional

## 2017-09-10 NOTE — Assessment & Plan Note (Signed)
Increased and uncontrolled, pt and family member, her daughter who accompanies her , [name], requests an extra 5 tablets of klonopin per month , as pt sometimes requires half tablet additional for sleep, request is not excessive and is grated with a 4 month follow up Discussed increased oversight need for falls and increased sedation, hence caution with overuse and possibility;lity of dependence, pt herself has a very robust and healthy respect for all medication

## 2017-09-10 NOTE — Assessment & Plan Note (Signed)
Hyperlipidemia:Low fat diet discussed and encouraged.   Lipid Panel  Lab Results  Component Value Date   CHOL 161 06/18/2017   HDL 64 06/18/2017   LDLCALC 68 06/18/2017   TRIG 146 06/18/2017   CHOLHDL 2.5 06/18/2017   Controlled, no change in medication Updated lab needed at/ before next visit.

## 2017-09-24 ENCOUNTER — Encounter: Payer: Self-pay | Admitting: Family Medicine
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
## 2017-10-07 ENCOUNTER — Encounter: Payer: Self-pay | Admitting: Cardiovascular Disease

## 2017-10-07 ENCOUNTER — Encounter: Payer: Self-pay | Admitting: *Deleted

## 2017-10-07 ENCOUNTER — Ambulatory Visit (INDEPENDENT_AMBULATORY_CARE_PROVIDER_SITE_OTHER): Payer: Medicare Other | Admitting: Cardiovascular Disease

## 2017-10-07 VITALS — BP 126/70 | HR 77 | Ht 61.0 in | Wt 114.4 lb

## 2017-10-07 DIAGNOSIS — I2510 Atherosclerotic heart disease of native coronary artery without angina pectoris: Secondary | ICD-10-CM | POA: Diagnosis not present

## 2017-10-07 DIAGNOSIS — R079 Chest pain, unspecified: Secondary | ICD-10-CM

## 2017-10-07 NOTE — Progress Notes (Signed)
.  h 

## 2017-10-07 NOTE — Patient Instructions (Signed)

## 2017-12-19 DIAGNOSIS — M79604 Pain in right leg: Secondary | ICD-10-CM | POA: Diagnosis not present

## 2017-12-19 DIAGNOSIS — M8000XD Age-related osteoporosis with current pathological fracture, unspecified site, subsequent encounter for fracture with routine healing: Secondary | ICD-10-CM | POA: Diagnosis not present

## 2017-12-19 DIAGNOSIS — M545 Low back pain: Secondary | ICD-10-CM | POA: Diagnosis not present

## 2017-12-19 DIAGNOSIS — M25551 Pain in right hip: Secondary | ICD-10-CM | POA: Diagnosis not present

## 2017-12-20 DIAGNOSIS — E785 Hyperlipidemia, unspecified: Secondary | ICD-10-CM | POA: Diagnosis not present

## 2017-12-20 DIAGNOSIS — R7302 Impaired glucose tolerance (oral): Secondary | ICD-10-CM | POA: Diagnosis not present

## 2017-12-20 LAB — COMPREHENSIVE METABOLIC PANEL
AG Ratio: 1.5 (calc) (ref 1.0–2.5)
ALT: 12 U/L (ref 6–29)
AST: 19 U/L (ref 10–35)
Albumin: 3.8 g/dL (ref 3.6–5.1)
Alkaline phosphatase (APISO): 54 U/L (ref 33–130)
BUN/Creatinine Ratio: 14 (calc) (ref 6–22)
BUN: 8 mg/dL (ref 7–25)
CO2: 30 mmol/L (ref 20–32)
Calcium: 9.2 mg/dL (ref 8.6–10.4)
Chloride: 107 mmol/L (ref 98–110)
Creat: 0.59 mg/dL — ABNORMAL LOW (ref 0.60–0.88)
Globulin: 2.5 g/dL (calc) (ref 1.9–3.7)
Glucose, Bld: 92 mg/dL (ref 65–99)
Potassium: 4.1 mmol/L (ref 3.5–5.3)
Sodium: 142 mmol/L (ref 135–146)
Total Bilirubin: 0.4 mg/dL (ref 0.2–1.2)
Total Protein: 6.3 g/dL (ref 6.1–8.1)

## 2017-12-20 LAB — LIPID PANEL
CHOLESTEROL: 160 mg/dL (ref <200)
HDL: 56 mg/dL (ref >50)
LDL CHOLESTEROL (CALC): 77 mg/dL
Non-HDL Cholesterol (Calc): 104 mg/dL (calc) (ref <130)
Total CHOL/HDL Ratio: 2.9 (calc) (ref <5.0)
Triglycerides: 169 mg/dL — ABNORMAL HIGH (ref <150)

## 2017-12-20 LAB — TSH: TSH: 5.64 mIU/L — ABNORMAL HIGH (ref 0.40–4.50)

## 2018-01-01 ENCOUNTER — Other Ambulatory Visit: Payer: Self-pay

## 2018-01-01 ENCOUNTER — Telehealth: Payer: Self-pay | Admitting: Family Medicine

## 2018-01-01 MED ORDER — ATORVASTATIN CALCIUM 10 MG PO TABS: 10.0000 mg | ORAL_TABLET | Freq: Every day | ORAL | 1 refills | Status: DC

## 2018-01-01 NOTE — Telephone Encounter (Signed)
atorvastatin (LIPITOR) 10 MG [facility]

## 2018-01-01 NOTE — Telephone Encounter (Signed)
Submitted

## 2018-01-04 ENCOUNTER — Other Ambulatory Visit: Payer: Self-pay

## 2018-01-04 ENCOUNTER — Emergency Department (HOSPITAL_COMMUNITY): Payer: Medicare Other

## 2018-01-04 ENCOUNTER — Encounter (HOSPITAL_COMMUNITY): Payer: Self-pay | Admitting: Emergency Medicine

## 2018-01-04 ENCOUNTER — Emergency Department (HOSPITAL_COMMUNITY)
Admission: EM | Admit: 2018-01-04 | Discharge: 2018-01-04 | Disposition: A | Discharge status: Home / Self Care | Payer: Medicare Other | Attending: Emergency Medicine | Admitting: Emergency Medicine

## 2018-01-04 DIAGNOSIS — S32050A Wedge compression fracture of fifth lumbar vertebra, initial encounter for closed fracture: Secondary | ICD-10-CM | POA: Diagnosis not present

## 2018-01-04 DIAGNOSIS — K5730 Diverticulosis of large intestine without perforation or abscess without bleeding: Secondary | ICD-10-CM | POA: Diagnosis not present

## 2018-01-04 DIAGNOSIS — M79604 Pain in right leg: Secondary | ICD-10-CM | POA: Diagnosis not present

## 2018-01-04 DIAGNOSIS — Z79899 Other long term (current) drug therapy: Secondary | ICD-10-CM | POA: Insufficient documentation

## 2018-01-04 DIAGNOSIS — I2510 Atherosclerotic heart disease of native coronary artery without angina pectoris: Secondary | ICD-10-CM | POA: Diagnosis not present

## 2018-01-04 DIAGNOSIS — M79661 Pain in right lower leg: Secondary | ICD-10-CM | POA: Diagnosis not present

## 2018-01-04 DIAGNOSIS — F17290 Nicotine dependence, other tobacco product, uncomplicated: Secondary | ICD-10-CM | POA: Insufficient documentation

## 2018-01-04 DIAGNOSIS — Z7982 Long term (current) use of aspirin: Secondary | ICD-10-CM | POA: Diagnosis not present

## 2018-01-04 DIAGNOSIS — S32000A Wedge compression fracture of unspecified lumbar vertebra, initial encounter for closed fracture: Secondary | ICD-10-CM | POA: Diagnosis not present

## 2018-01-04 DIAGNOSIS — M79651 Pain in right thigh: Secondary | ICD-10-CM | POA: Diagnosis not present

## 2018-01-04 LAB — URINALYSIS, ROUTINE W REFLEX MICROSCOPIC
Bilirubin Urine: NEGATIVE
Glucose, UA: NEGATIVE mg/dL
Ketones, ur: NEGATIVE mg/dL
Nitrite: NEGATIVE
PROTEIN: NEGATIVE mg/dL
SPECIFIC GRAVITY, URINE: 1.005 (ref 1.005–1.030)
pH: 5 (ref 5.0–8.0)

## 2018-01-04 MED ORDER — HYDROCODONE-ACETAMINOPHEN 5-325 MG PO TABS: 1.0000 | ORAL_TABLET | Freq: Four times a day (QID) | ORAL | 0 refills | Status: DC | PRN

## 2018-01-04 MED ORDER — DOCUSATE SODIUM 100 MG PO CAPS: 100.0000 mg | ORAL_CAPSULE | Freq: Two times a day (BID) | ORAL | 0 refills | Status: DC

## 2018-01-04 MED ORDER — HYDROCODONE-ACETAMINOPHEN 5-325 MG PO TABS
1.0000 | ORAL_TABLET | Freq: Once | ORAL | Status: AC
  Administered 2018-01-04: 1 via ORAL
  Filled 2018-01-04: qty 1
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
## 2018-01-04 NOTE — ED Notes (Signed)
Pt ambulatory to waiting room. Pt verbalized understanding of discharge instructions.   

## 2018-01-04 NOTE — ED Triage Notes (Addendum)
PT c/o right upper leg pain when trying to walk x4 days, pt states she was getting out of her daughter's SUV and has been hurting ever since. PT also c/o increased urinary frequency.

## 2018-01-07 ENCOUNTER — Encounter: Payer: Self-pay | Admitting: Family Medicine

## 2018-01-07 ENCOUNTER — Telehealth: Payer: Self-pay | Admitting: Family Medicine

## 2018-01-07 ENCOUNTER — Ambulatory Visit (INDEPENDENT_AMBULATORY_CARE_PROVIDER_SITE_OTHER): Payer: Medicare Other | Admitting: Family Medicine

## 2018-01-07 VITALS — BP 160/70 | HR 78 | Resp 17 | Ht 61.0 in

## 2018-01-07 DIAGNOSIS — R7302 Impaired glucose tolerance (oral): Secondary | ICD-10-CM | POA: Diagnosis not present

## 2018-01-07 DIAGNOSIS — F411 Generalized anxiety disorder: Secondary | ICD-10-CM

## 2018-01-07 DIAGNOSIS — E785 Hyperlipidemia, unspecified: Secondary | ICD-10-CM

## 2018-01-07 DIAGNOSIS — D539 Nutritional anemia, unspecified: Secondary | ICD-10-CM | POA: Diagnosis not present

## 2018-01-07 DIAGNOSIS — E559 Vitamin D deficiency, unspecified: Secondary | ICD-10-CM

## 2018-01-07 DIAGNOSIS — M8000XS Age-related osteoporosis with current pathological fracture, unspecified site, sequela: Secondary | ICD-10-CM

## 2018-01-07 DIAGNOSIS — M549 Dorsalgia, unspecified: Secondary | ICD-10-CM | POA: Diagnosis not present

## 2018-01-07 DIAGNOSIS — J302 Other seasonal allergic rhinitis: Secondary | ICD-10-CM

## 2018-01-07 MED ORDER — CALCITONIN (SALMON) 200 UNIT/ACT NA SOLN
1.0000 | Freq: Every day | NASAL | 2 refills | Status: DC
Start: 2018-01-07 — End: 2018-01-08

## 2018-01-07 NOTE — Telephone Encounter (Signed)
[facility] [place] not the [facility] in [place]

## 2018-01-07 NOTE — Patient Instructions (Addendum)
Wellness with nurse past due, please schedule  MD f/u in 5.5 months, call if you need me before  New for pain with osteoporotic fracture is calcitonin nasal spray, if it biothers you , do not use, it is meant for pain relief  Liver is great and so is your cholesterol  CBC, , ferritin iron and B12, fasting lipid, cmp and Vit D  For vist in 5 months, please get 1 week before  I hope that your nasal spray helps the pain with the fracture, it should.  Please call and see if dr Merlene Laughter can see you sooner  Continue all of your medication as before  Heart and lung exam today is good

## 2018-01-08 ENCOUNTER — Other Ambulatory Visit: Payer: Self-pay

## 2018-01-08 MED ORDER — CALCITONIN (SALMON) 200 UNIT/ACT NA SOLN: 1.0000 | Freq: Every day | NASAL | 2 refills | Status: DC

## 2018-01-08 NOTE — Telephone Encounter (Signed)
Med sent to [facility]

## 2018-01-09 ENCOUNTER — Emergency Department (HOSPITAL_COMMUNITY)
Admission: EM | Admit: 2018-01-09 | Discharge: 2018-01-10 | Disposition: A | Discharge status: Home / Self Care | Payer: Medicare Other | Attending: Emergency Medicine | Admitting: Emergency Medicine

## 2018-01-09 ENCOUNTER — Other Ambulatory Visit: Payer: Self-pay

## 2018-01-09 ENCOUNTER — Encounter (HOSPITAL_COMMUNITY): Payer: Self-pay | Admitting: Emergency Medicine

## 2018-01-09 DIAGNOSIS — M79606 Pain in leg, unspecified: Secondary | ICD-10-CM | POA: Diagnosis not present

## 2018-01-09 DIAGNOSIS — Z7982 Long term (current) use of aspirin: Secondary | ICD-10-CM | POA: Diagnosis not present

## 2018-01-09 DIAGNOSIS — I259 Chronic ischemic heart disease, unspecified: Secondary | ICD-10-CM | POA: Diagnosis not present

## 2018-01-09 DIAGNOSIS — Z79899 Other long term (current) drug therapy: Secondary | ICD-10-CM | POA: Diagnosis not present

## 2018-01-09 DIAGNOSIS — R52 Pain, unspecified: Secondary | ICD-10-CM | POA: Diagnosis not present

## 2018-01-09 DIAGNOSIS — M79604 Pain in right leg: Secondary | ICD-10-CM | POA: Diagnosis present

## 2018-01-09 DIAGNOSIS — M79651 Pain in right thigh: Secondary | ICD-10-CM | POA: Insufficient documentation

## 2018-01-09 LAB — CBC WITH DIFFERENTIAL/PLATELET
Basophils Absolute: 0 10*3/uL (ref 0.0–0.1)
Basophils Relative: 0 %
Eosinophils Absolute: 0.1 10*3/uL (ref 0.0–0.7)
Eosinophils Relative: 1 %
HCT: 30.8 % — ABNORMAL LOW (ref 36.0–46.0)
HEMOGLOBIN: 9.8 g/dL — AB (ref 12.0–15.0)
LYMPHS ABS: 2.4 10*3/uL (ref 0.7–4.0)
LYMPHS PCT: 30 %
MCH: 30.9 pg (ref 26.0–34.0)
MCHC: 31.8 g/dL (ref 30.0–36.0)
MCV: 97.2 fL (ref 78.0–100.0)
Monocytes Absolute: 0.8 10*3/uL (ref 0.1–1.0)
Monocytes Relative: 10 %
NEUTROS ABS: 4.6 10*3/uL (ref 1.7–7.7)
NEUTROS PCT: 59 %
Platelets: 321 10*3/uL (ref 150–400)
RBC: 3.17 MIL/uL — AB (ref 3.87–5.11)
RDW: 16.5 % — ABNORMAL HIGH (ref 11.5–15.5)
WBC: 7.9 10*3/uL (ref 4.0–10.5)

## 2018-01-09 LAB — BASIC METABOLIC PANEL
ANION GAP: 10 (ref 5–15)
BUN: 10 mg/dL (ref 6–20)
CHLORIDE: 104 mmol/L (ref 101–111)
CO2: 27 mmol/L (ref 22–32)
Calcium: 8.9 mg/dL (ref 8.9–10.3)
Creatinine, Ser: 0.62 mg/dL (ref 0.44–1.00)
GFR calc non Af Amer: >60 mL/min (ref >60)
Glucose, Bld: 110 mg/dL — ABNORMAL HIGH (ref 65–99)
POTASSIUM: 4 mmol/L (ref 3.5–5.1)
SODIUM: 141 mmol/L (ref 135–145)

## 2018-01-09 LAB — CK: CK TOTAL: 55 U/L (ref 38–234)

## 2018-01-09 LAB — MAGNESIUM: Magnesium: 1.9 mg/dL (ref 1.7–2.4)

## 2018-01-09 MED ORDER — HYDROCODONE-ACETAMINOPHEN 5-325 MG PO TABS: 1.0000 | ORAL_TABLET | Freq: Four times a day (QID) | ORAL | 0 refills | Status: DC | PRN

## 2018-01-09 MED ORDER — DIAZEPAM 2 MG PO TABS
2.0000 mg | ORAL_TABLET | Freq: Once | ORAL | Status: AC
  Administered 2018-01-09: 2 mg via ORAL

## 2018-01-09 MED ORDER — HYDROCODONE-ACETAMINOPHEN 5-325 MG PO TABS
1.0000 | ORAL_TABLET | Freq: Once | ORAL | Status: AC
  Administered 2018-01-09: 1 via ORAL
  Filled 2018-01-09: qty 1

## 2018-01-09 MED ORDER — DIAZEPAM 2 MG PO TABS
ORAL_TABLET | ORAL | Status: AC
  Administered 2018-01-09: 2 mg via ORAL
  Filled 2018-01-09: qty 1

## 2018-01-09 NOTE — Discharge Instructions (Addendum)
IMPORTANT PATIENT INSTRUCTIONS:  Your ED provider has recommended an Outpatient Ultrasound.  Please call [phone] to schedule an appointment.  If your appointment is scheduled for a Saturday, Sunday or holiday, please go to the [facility] Registration Desk at least 15 minutes prior to your appointment time and tell them you are there for an ultrasound.    If your appointment is scheduled for a weekday (Monday-Friday), please go directly to the [facility] at least 15 minutes prior to your appointment time and tell them you are there for an ultrasound.  Please call [phone] with questions. 

## 2018-01-09 NOTE — ED Notes (Signed)
Pt does not want to take her hydrocodone because it makes her constipated and also does not want to take her tramadol because she has hallucinations when taking it.

## 2018-01-09 NOTE — ED Triage Notes (Signed)
Pt c/o right thigh spasm and pain. Pt was seen for the same this week.

## 2018-01-10 ENCOUNTER — Other Ambulatory Visit: Payer: Self-pay | Admitting: Family Medicine

## 2018-01-10 DIAGNOSIS — M5416 Radiculopathy, lumbar region: Secondary | ICD-10-CM | POA: Diagnosis not present

## 2018-01-10 DIAGNOSIS — M5415 Radiculopathy, thoracolumbar region: Secondary | ICD-10-CM | POA: Diagnosis not present

## 2018-01-10 DIAGNOSIS — M8000XD Age-related osteoporosis with current pathological fracture, unspecified site, subsequent encounter for fracture with routine healing: Secondary | ICD-10-CM | POA: Diagnosis not present

## 2018-01-10 DIAGNOSIS — M79604 Pain in right leg: Secondary | ICD-10-CM | POA: Diagnosis not present

## 2018-01-10 NOTE — ED Notes (Signed)
Pt wheeled to waiting room. Pt verbalized understanding of discharge instructions.   
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
## 2018-01-12 ENCOUNTER — Other Ambulatory Visit: Payer: Self-pay | Admitting: Family Medicine

## 2018-01-12 ENCOUNTER — Encounter: Payer: Self-pay | Admitting: Family Medicine
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
## 2018-01-12 NOTE — Assessment & Plan Note (Signed)
Hyperlipidemia:Low fat diet discussed and encouraged.   Lipid Panel  Lab Results  Component Value Date   CHOL 160 12/20/2017   HDL 56 12/20/2017   LDLCALC 77 12/20/2017   TRIG 169 (H) 12/20/2017   CHOLHDL 2.9 12/20/2017     Controlled, no change in medication, encouraged her to reduce her fatty food intake

## 2018-01-12 NOTE — Assessment & Plan Note (Signed)
Controlled, continue current medication 

## 2018-01-12 NOTE — Assessment & Plan Note (Signed)
Uncontrolled back and RLE pain worse in past 1 week, pt has been unable to tolerate back brace in the past Trial of calcitonin nasal spray for pain relief

## 2018-01-12 NOTE — Assessment & Plan Note (Signed)
managed by pain management,on tramadol and gabapentin.now acutely decompensated hopefully she will be able to get a sooner appt than scheduled

## 2018-01-12 NOTE — Assessment & Plan Note (Signed)
Continue current medication management

## 2018-01-13 ENCOUNTER — Telehealth: Payer: Self-pay | Admitting: Family Medicine

## 2018-01-13 ENCOUNTER — Other Ambulatory Visit: Payer: Self-pay | Admitting: Neurology

## 2018-01-13 DIAGNOSIS — M5416 Radiculopathy, lumbar region: Secondary | ICD-10-CM

## 2018-01-13 NOTE — Telephone Encounter (Signed)
Pt requesting refill to [facility] clonazePAM (KLONOPIN) 0.5 MG tablet [[id number]]

## 2018-01-14 NOTE — Telephone Encounter (Signed)
This has been sent

## 2018-01-15 ENCOUNTER — Ambulatory Visit
Admission: RE | Admit: 2018-01-15 | Discharge: 2018-01-15 | Disposition: A | Discharge status: Home / Self Care | Payer: Medicare Other | Source: Ambulatory Visit | Attending: Neurology | Admitting: Neurology

## 2018-01-15 DIAGNOSIS — M5416 Radiculopathy, lumbar region: Secondary | ICD-10-CM

## 2018-01-15 DIAGNOSIS — M545 Low back pain: Secondary | ICD-10-CM | POA: Diagnosis not present

## 2018-01-15 MED ORDER — METHYLPREDNISOLONE ACETATE 40 MG/ML INJ SUSP (RADIOLOG
120.0000 mg | Freq: Once | INTRAMUSCULAR | Status: AC
  Administered 2018-01-15: 120 mg via EPIDURAL

## 2018-01-15 MED ORDER — IOPAMIDOL (ISOVUE-M 200) INJECTION 41%
1.0000 mL | Freq: Once | INTRAMUSCULAR | Status: AC
  Administered 2018-01-15: 1 mL via EPIDURAL

## 2018-01-15 NOTE — Discharge Instructions (Signed)

## 2018-01-21 ENCOUNTER — Telehealth: Payer: Self-pay | Admitting: Family Medicine

## 2018-01-21 NOTE — Telephone Encounter (Signed)
If the pt calls in we need to schedule a Medicare  Wellness exam

## 2018-02-11 ENCOUNTER — Other Ambulatory Visit: Payer: Self-pay | Admitting: Neurology

## 2018-02-11 ENCOUNTER — Other Ambulatory Visit (HOSPITAL_COMMUNITY): Payer: Self-pay | Admitting: Neurology

## 2018-02-11 DIAGNOSIS — M5415 Radiculopathy, thoracolumbar region: Secondary | ICD-10-CM | POA: Diagnosis not present

## 2018-02-11 DIAGNOSIS — R29898 Other symptoms and signs involving the musculoskeletal system: Secondary | ICD-10-CM

## 2018-02-11 DIAGNOSIS — M79604 Pain in right leg: Secondary | ICD-10-CM | POA: Diagnosis not present

## 2018-02-11 DIAGNOSIS — M5416 Radiculopathy, lumbar region: Secondary | ICD-10-CM | POA: Diagnosis not present

## 2018-02-11 DIAGNOSIS — M8000XD Age-related osteoporosis with current pathological fracture, unspecified site, subsequent encounter for fracture with routine healing: Secondary | ICD-10-CM | POA: Diagnosis not present

## 2018-02-14 ENCOUNTER — Ambulatory Visit: Payer: Medicare Other

## 2018-02-21 ENCOUNTER — Ambulatory Visit (HOSPITAL_COMMUNITY)
Admission: RE | Admit: 2018-02-21 | Discharge: 2018-02-21 | Disposition: A | Discharge status: Home / Self Care | Payer: Medicare Other | Source: Ambulatory Visit | Attending: Neurology | Admitting: Neurology

## 2018-02-21 DIAGNOSIS — R600 Localized edema: Secondary | ICD-10-CM | POA: Diagnosis not present

## 2018-02-21 DIAGNOSIS — R29898 Other symptoms and signs involving the musculoskeletal system: Secondary | ICD-10-CM | POA: Diagnosis not present

## 2018-02-21 DIAGNOSIS — M4856XA Collapsed vertebra, not elsewhere classified, lumbar region, initial encounter for fracture: Secondary | ICD-10-CM | POA: Diagnosis not present

## 2018-02-21 DIAGNOSIS — M545 Low back pain: Secondary | ICD-10-CM | POA: Diagnosis not present

## 2018-02-21 DIAGNOSIS — M79604 Pain in right leg: Secondary | ICD-10-CM | POA: Diagnosis not present

## 2018-02-24 ENCOUNTER — Other Ambulatory Visit: Payer: Self-pay

## 2018-03-05 ENCOUNTER — Other Ambulatory Visit: Payer: Self-pay | Admitting: Neurology

## 2018-03-05 DIAGNOSIS — M5416 Radiculopathy, lumbar region: Secondary | ICD-10-CM

## 2018-03-12 ENCOUNTER — Ambulatory Visit
Admission: RE | Admit: 2018-03-12 | Discharge: 2018-03-12 | Disposition: A | Discharge status: Home / Self Care | Payer: Medicare Other | Source: Ambulatory Visit | Attending: Neurology | Admitting: Neurology

## 2018-03-12 DIAGNOSIS — M47817 Spondylosis without myelopathy or radiculopathy, lumbosacral region: Secondary | ICD-10-CM | POA: Diagnosis not present

## 2018-03-12 DIAGNOSIS — M5416 Radiculopathy, lumbar region: Secondary | ICD-10-CM

## 2018-03-12 MED ORDER — IOPAMIDOL (ISOVUE-M 200) INJECTION 41%
1.0000 mL | Freq: Once | INTRAMUSCULAR | Status: AC
  Administered 2018-03-12: 1 mL via EPIDURAL

## 2018-03-12 MED ORDER — METHYLPREDNISOLONE ACETATE 40 MG/ML INJ SUSP (RADIOLOG
120.0000 mg | Freq: Once | INTRAMUSCULAR | Status: AC
  Administered 2018-03-12: 120 mg via EPIDURAL

## 2018-03-12 NOTE — Discharge Instructions (Signed)

## 2018-03-19 DIAGNOSIS — M79604 Pain in right leg: Secondary | ICD-10-CM | POA: Diagnosis not present

## 2018-03-19 DIAGNOSIS — M5415 Radiculopathy, thoracolumbar region: Secondary | ICD-10-CM | POA: Diagnosis not present

## 2018-03-19 DIAGNOSIS — M5416 Radiculopathy, lumbar region: Secondary | ICD-10-CM | POA: Diagnosis not present

## 2018-03-19 DIAGNOSIS — M545 Low back pain: Secondary | ICD-10-CM | POA: Diagnosis not present

## 2018-03-19 DIAGNOSIS — M8000XD Age-related osteoporosis with current pathological fracture, unspecified site, subsequent encounter for fracture with routine healing: Secondary | ICD-10-CM | POA: Diagnosis not present

## 2018-03-24 ENCOUNTER — Other Ambulatory Visit: Payer: Self-pay | Admitting: Nurse Practitioner

## 2018-03-25 NOTE — Telephone Encounter (Signed)
Need OV. Limited refills provided.

## 2018-03-25 NOTE — Telephone Encounter (Signed)
Lmom, waiting on a return call.  

## 2018-03-27 ENCOUNTER — Telehealth: Payer: Self-pay | Admitting: Internal Medicine

## 2018-03-27 NOTE — Telephone Encounter (Signed)
Pt's daughter, Vito Backers, called to say that she was following up on a phone call the patient received yesterday and wanted to know what it was about. I do not see a phone note from our office and we don't have patient scheduled for anything. I told the daughter that [facility] nurse would check on it and call her back at [phone]

## 2018-03-27 NOTE — Telephone Encounter (Signed)
Spoke with pts daughter. Pts medication was called into the pharmacy on 03/25/18. No message is in the system from the nurse. The pharmacy could've called them to let them be aware that her medication was ready.

## 2018-03-28 ENCOUNTER — Other Ambulatory Visit: Payer: Self-pay | Admitting: Family Medicine

## 2018-03-28 ENCOUNTER — Telehealth: Payer: Self-pay | Admitting: Family Medicine

## 2018-03-28 DIAGNOSIS — M25569 Pain in unspecified knee: Principal | ICD-10-CM

## 2018-03-28 DIAGNOSIS — G8929 Other chronic pain: Secondary | ICD-10-CM

## 2018-03-28 NOTE — Telephone Encounter (Signed)
Patient states she would like to see Dr.Yates in [place] location

## 2018-03-28 NOTE — Telephone Encounter (Signed)
We do not inject knees pls refer her to ortho of her choice, send me a message who she wants have to see I will refer

## 2018-03-28 NOTE — Telephone Encounter (Signed)
Referral entered, thanks.

## 2018-03-28 NOTE — Telephone Encounter (Signed)
Patients caregiver called in to request a cortisone inj in her knee. Do we do these ? I scheduled her an appt for 04/03/18 but I just wanna make sure its ok.

## 2018-03-28 NOTE — Progress Notes (Signed)
amb ortho  

## 2018-04-03 ENCOUNTER — Ambulatory Visit: Payer: Medicare Other | Admitting: Family Medicine

## 2018-04-09 NOTE — Telephone Encounter (Signed)
Pts daughter is aware and will inform her mother that she needs an appointment for further refills.

## 2018-04-17 ENCOUNTER — Ambulatory Visit (INDEPENDENT_AMBULATORY_CARE_PROVIDER_SITE_OTHER): Payer: Medicare Other | Admitting: Orthopaedic Surgery

## 2018-04-17 ENCOUNTER — Encounter (INDEPENDENT_AMBULATORY_CARE_PROVIDER_SITE_OTHER): Payer: Self-pay | Admitting: Orthopaedic Surgery

## 2018-04-17 VITALS — BP 142/68 | HR 72 | Ht 63.0 in | Wt 108.0 lb

## 2018-04-17 DIAGNOSIS — S32030D Wedge compression fracture of third lumbar vertebra, subsequent encounter for fracture with routine healing: Secondary | ICD-10-CM

## 2018-04-17 DIAGNOSIS — S32050D Wedge compression fracture of fifth lumbar vertebra, subsequent encounter for fracture with routine healing: Secondary | ICD-10-CM

## 2018-04-17 DIAGNOSIS — T07XXXA Unspecified multiple injuries, initial encounter: Secondary | ICD-10-CM | POA: Diagnosis not present

## 2018-04-17 DIAGNOSIS — M7061 Trochanteric bursitis, right hip: Secondary | ICD-10-CM

## 2018-04-17 DIAGNOSIS — M5416 Radiculopathy, lumbar region: Secondary | ICD-10-CM | POA: Diagnosis not present

## 2018-04-17 DIAGNOSIS — M810 Age-related osteoporosis without current pathological fracture: Secondary | ICD-10-CM | POA: Diagnosis not present

## 2018-04-17 DIAGNOSIS — M79604 Pain in right leg: Secondary | ICD-10-CM | POA: Diagnosis not present

## 2018-04-17 DIAGNOSIS — M545 Low back pain: Secondary | ICD-10-CM | POA: Diagnosis not present

## 2018-04-17 DIAGNOSIS — M8000XD Age-related osteoporosis with current pathological fracture, unspecified site, subsequent encounter for fracture with routine healing: Secondary | ICD-10-CM | POA: Diagnosis not present

## 2018-04-17 DIAGNOSIS — M5415 Radiculopathy, thoracolumbar region: Secondary | ICD-10-CM | POA: Diagnosis not present

## 2018-04-17 MED ORDER — LIDOCAINE HCL 1 % IJ SOLN
0.5000 mL | INTRAMUSCULAR | Status: AC | PRN
  Administered 2018-04-17: .5 mL

## 2018-04-17 MED ORDER — METHYLPREDNISOLONE ACETATE 40 MG/ML IJ SUSP
40.0000 mg | INTRAMUSCULAR | Status: AC | PRN
  Administered 2018-04-17: 40 mg via INTRA_ARTICULAR

## 2018-04-17 MED ORDER — BUPIVACAINE HCL 0.25 % IJ SOLN
2.0000 mL | INTRAMUSCULAR | Status: AC | PRN
  Administered 2018-04-17: 2 mL via INTRA_ARTICULAR
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
## 2018-05-01 DIAGNOSIS — B078 Other viral warts: Secondary | ICD-10-CM | POA: Diagnosis not present

## 2018-05-01 DIAGNOSIS — C44311 Basal cell carcinoma of skin of nose: Secondary | ICD-10-CM | POA: Diagnosis not present

## 2018-05-28 ENCOUNTER — Telehealth (INDEPENDENT_AMBULATORY_CARE_PROVIDER_SITE_OTHER): Payer: Self-pay | Admitting: Orthopaedic Surgery

## 2018-05-28 NOTE — Telephone Encounter (Signed)
Kara [name]  [phone]  Bone Density order for patient appointment tomorrow

## 2018-05-28 NOTE — Telephone Encounter (Signed)
I called and spoke with Bethena Roys.  The order for Bone Density was not showing that it was signed by Dr. Lorin Mercy in the computer. She was able to pull where he cosigned verbal order put in by me.  This works for [employer]. Order complete.

## 2018-05-29 ENCOUNTER — Inpatient Hospital Stay (HOSPITAL_COMMUNITY): Admission: RE | Admit: 2018-05-29 | Payer: Medicare Other | Source: Ambulatory Visit

## 2018-05-31 ENCOUNTER — Other Ambulatory Visit: Payer: Self-pay | Admitting: Obstetrics and Gynecology

## 2018-06-09 DIAGNOSIS — C44311 Basal cell carcinoma of skin of nose: Secondary | ICD-10-CM | POA: Diagnosis not present

## 2018-06-09 DIAGNOSIS — E559 Vitamin D deficiency, unspecified: Secondary | ICD-10-CM | POA: Diagnosis not present

## 2018-06-09 DIAGNOSIS — D539 Nutritional anemia, unspecified: Secondary | ICD-10-CM | POA: Diagnosis not present

## 2018-06-09 DIAGNOSIS — R7302 Impaired glucose tolerance (oral): Secondary | ICD-10-CM | POA: Diagnosis not present

## 2018-06-09 DIAGNOSIS — E785 Hyperlipidemia, unspecified: Secondary | ICD-10-CM | POA: Diagnosis not present

## 2018-06-10 LAB — CBC
HEMATOCRIT: 31.2 % — AB (ref 35.0–45.0)
Hemoglobin: 10.3 g/dL — ABNORMAL LOW (ref 11.7–15.5)
MCH: 31.1 pg (ref 27.0–33.0)
MCHC: 33 g/dL (ref 32.0–36.0)
MCV: 94.3 fL (ref 80.0–100.0)
MPV: 10 fL (ref 7.5–12.5)
Platelets: 256 10*3/uL (ref 140–400)
RBC: 3.31 10*6/uL — AB (ref 3.80–5.10)
RDW: 15.2 % — ABNORMAL HIGH (ref 11.0–15.0)
WBC: 6.1 10*3/uL (ref 3.8–10.8)

## 2018-06-10 LAB — COMPREHENSIVE METABOLIC PANEL
AG Ratio: 1.4 (calc) (ref 1.0–2.5)
ALKALINE PHOSPHATASE (APISO): 56 U/L (ref 33–130)
ALT: 11 U/L (ref 6–29)
AST: 22 U/L (ref 10–35)
Albumin: 4 g/dL (ref 3.6–5.1)
BUN / CREAT RATIO: 9 (calc) (ref 6–22)
BUN: 6 mg/dL — AB (ref 7–25)
CHLORIDE: 103 mmol/L (ref 98–110)
CO2: 30 mmol/L (ref 20–32)
CREATININE: 0.69 mg/dL (ref 0.60–0.88)
Calcium: 9.7 mg/dL (ref 8.6–10.4)
GLUCOSE: 103 mg/dL — AB (ref 65–99)
Globulin: 2.8 g/dL (calc) (ref 1.9–3.7)
Potassium: 4.1 mmol/L (ref 3.5–5.3)
Sodium: 141 mmol/L (ref 135–146)
Total Bilirubin: 0.5 mg/dL (ref 0.2–1.2)
Total Protein: 6.8 g/dL (ref 6.1–8.1)

## 2018-06-10 LAB — LIPID PANEL
CHOL/HDL RATIO: 3.4 (calc) (ref <5.0)
Cholesterol: 234 mg/dL — ABNORMAL HIGH (ref <200)
HDL: 68 mg/dL (ref >50)
LDL CHOLESTEROL (CALC): 143 mg/dL — AB
NON-HDL CHOLESTEROL (CALC): 166 mg/dL — AB (ref <130)
TRIGLYCERIDES: 115 mg/dL (ref <150)

## 2018-06-10 LAB — VITAMIN D 25 HYDROXY (VIT D DEFICIENCY, FRACTURES): VIT D 25 HYDROXY: 39 ng/mL (ref 30–100)

## 2018-06-10 LAB — IRON: Iron: 119 ug/dL (ref 45–160)

## 2018-06-10 LAB — VITAMIN B12: Vitamin B-12: 1092 pg/mL (ref 200–1100)

## 2018-06-10 LAB — FERRITIN: Ferritin: 39 ng/mL (ref 16–288)

## 2018-06-17 ENCOUNTER — Encounter: Payer: Self-pay | Admitting: Family Medicine

## 2018-06-17 ENCOUNTER — Ambulatory Visit (INDEPENDENT_AMBULATORY_CARE_PROVIDER_SITE_OTHER): Payer: Medicare Other | Admitting: Family Medicine

## 2018-06-17 VITALS — BP 136/70 | HR 84 | Resp 16 | Ht 63.0 in | Wt 113.0 lb

## 2018-06-17 DIAGNOSIS — J302 Other seasonal allergic rhinitis: Secondary | ICD-10-CM

## 2018-06-17 DIAGNOSIS — M1990 Unspecified osteoarthritis, unspecified site: Secondary | ICD-10-CM

## 2018-06-17 DIAGNOSIS — R7302 Impaired glucose tolerance (oral): Secondary | ICD-10-CM

## 2018-06-17 DIAGNOSIS — M8000XS Age-related osteoporosis with current pathological fracture, unspecified site, sequela: Secondary | ICD-10-CM | POA: Diagnosis not present

## 2018-06-17 DIAGNOSIS — Z13220 Encounter for screening for lipoid disorders: Secondary | ICD-10-CM | POA: Diagnosis not present

## 2018-06-17 DIAGNOSIS — E785 Hyperlipidemia, unspecified: Secondary | ICD-10-CM

## 2018-06-17 NOTE — Patient Instructions (Addendum)
Wellness with Nurse,   try for October appointment    f/u with MD in 5.5 months, call if you need me befopre  Fasting lipid, cmp and EGFR, TSH , and CBC 1 week before MD visit

## 2018-06-19 ENCOUNTER — Encounter: Payer: Self-pay | Admitting: Family Medicine

## 2018-06-19 NOTE — Assessment & Plan Note (Addendum)
Controlled, no need for   Medication at this  time

## 2018-06-19 NOTE — Assessment & Plan Note (Signed)
Updated lab needed at/ before next visit. Advised to reduce intake of sugar and carbs

## 2018-06-19 NOTE — Assessment & Plan Note (Signed)
Controlled via pain management

## 2018-06-19 NOTE — Assessment & Plan Note (Signed)
Intolerant of oral bisphosphonate and no interest in parenteral

## 2018-06-19 NOTE — Assessment & Plan Note (Signed)
Hyperlipidemia:Low fat diet discussed and encouraged.   Lipid Panel  Lab Results  Component Value Date   CHOL 234 (H) 06/09/2018   HDL 68 06/09/2018   LDLCALC 143 (H) 06/09/2018   TRIG 115 06/09/2018   CHOLHDL 3.4 06/09/2018    Adequately managed without medication, pt to reduce fried and fatty food intake

## 2018-06-19 NOTE — Progress Notes (Signed)
   Kara [name]     MRN: [id number]      DOB: 08-11-1927   HPI Kara [name] is here for follow up and re-evaluation of chronic medical conditions, medication management and review of any available recent lab and radiology data.  Preventive health is updated, specifically  Cancer screening and Immunization.   Happy with current pain mmanagement and is more mobile, eating better and in less pain The PT denies any adverse reactions to current medications since the last visit.  There are no new concerns.  There are no specific complaints   ROS Denies recent fever or chills. Denies sinus pressure, nasal congestion, ear pain or sore throat. Denies chest congestion, productive cough or wheezing. Denies chest pains, palpitations and leg swelling Denies abdominal pain, nausea, vomiting,diarrhea or constipation.   Denies dysuria, frequency, hesitancy or incontinence.  Denies headaches, seizures, numbness, or tingling. Denies depression, anxiety or insomnia. Denies skin break down or rash.   PE  BP 136/70   Pulse 84   Resp 16   Ht 5\' 3"  (1.6 m)   Wt 113 lb (51.3 kg)   SpO2 97%   BMI 20.02 kg/m   Patient alert and oriented and in no cardiopulmonary distress.  HEENT: No facial asymmetry, EOMI,   oropharynx pink and moist.  Neck decreased ROM no JVD, no mass.  Chest: Clear to auscultation bilaterally.  CVS: S1, S2 no murmurs, no S3.Regular rate.  ABD: Soft non tender.   Ext: No edema  MS: Decreased  ROM spine, shoulders, hips and knees.  Skin: Intact, no ulcerations or rash noted.  Psych: Good eye contact, normal affect. Memory mildly impaired not anxious or depressed appearing.  CNS: CN 2-12 intact, power,  normal throughout.no focal deficits noted.   Assessment & Plan  Hyperlipidemia LDL goal <100 Hyperlipidemia:Low fat diet discussed and encouraged.   Lipid Panel  Lab Results  Component Value Date   CHOL 234 (H) 06/09/2018   HDL 68 06/09/2018   LDLCALC 143 (H)  06/09/2018   TRIG 115 06/09/2018   CHOLHDL 3.4 06/09/2018    Adequately managed without medication, pt to reduce fried and fatty food intake   Seasonal allergic rhinitis Controlled, no need for   Medication at this  time   IGT (impaired glucose tolerance) Updated lab needed at/ before next visit. Advised to reduce intake of sugar and carbs  Age-related osteoporosis with current pathological fracture Intolerant of oral bisphosphonate and no interest in parenteral  Arthritis Controlled via pain management

## 2018-06-23 ENCOUNTER — Other Ambulatory Visit: Payer: Self-pay | Admitting: Family Medicine

## 2018-06-24 ENCOUNTER — Other Ambulatory Visit: Payer: Self-pay | Admitting: Gastroenterology

## 2018-06-24 NOTE — Telephone Encounter (Signed)
Noted. Pt is aware and has an appointment.

## 2018-06-24 NOTE — Telephone Encounter (Signed)
Please tell the patient I can send in a limited refill but we haven't seen them in 2 years and will need f/u OV for further refills.

## 2018-06-26 ENCOUNTER — Telehealth: Payer: Self-pay | Admitting: *Deleted

## 2018-06-29 ENCOUNTER — Other Ambulatory Visit: Payer: Self-pay | Admitting: Obstetrics and Gynecology

## 2018-06-29 MED ORDER — CLOBETASOL PROPIONATE 0.05 % EX CREA: TOPICAL_CREAM | CUTANEOUS | 0 refills | Status: DC

## 2018-06-29 NOTE — Progress Notes (Signed)
refil clobetasol for 2x/wk use lightly

## 2018-07-03 DIAGNOSIS — M8000XD Age-related osteoporosis with current pathological fracture, unspecified site, subsequent encounter for fracture with routine healing: Secondary | ICD-10-CM | POA: Diagnosis not present

## 2018-07-03 DIAGNOSIS — L570 Actinic keratosis: Secondary | ICD-10-CM | POA: Diagnosis not present

## 2018-07-03 DIAGNOSIS — M130 Polyarthritis, unspecified: Secondary | ICD-10-CM | POA: Diagnosis not present

## 2018-07-03 DIAGNOSIS — B078 Other viral warts: Secondary | ICD-10-CM | POA: Diagnosis not present

## 2018-07-03 DIAGNOSIS — X32XXXD Exposure to sunlight, subsequent encounter: Secondary | ICD-10-CM | POA: Diagnosis not present

## 2018-07-03 DIAGNOSIS — Z85828 Personal history of other malignant neoplasm of skin: Secondary | ICD-10-CM | POA: Diagnosis not present

## 2018-07-03 DIAGNOSIS — M5416 Radiculopathy, lumbar region: Secondary | ICD-10-CM | POA: Diagnosis not present

## 2018-07-03 DIAGNOSIS — M79604 Pain in right leg: Secondary | ICD-10-CM | POA: Diagnosis not present

## 2018-07-03 DIAGNOSIS — Z08 Encounter for follow-up examination after completed treatment for malignant neoplasm: Secondary | ICD-10-CM | POA: Diagnosis not present

## 2018-07-14 ENCOUNTER — Ambulatory Visit (INDEPENDENT_AMBULATORY_CARE_PROVIDER_SITE_OTHER): Payer: Medicare Other

## 2018-07-14 DIAGNOSIS — Z23 Encounter for immunization: Secondary | ICD-10-CM | POA: Diagnosis not present

## 2018-07-15 ENCOUNTER — Encounter: Payer: Self-pay | Admitting: Internal Medicine

## 2018-07-15 ENCOUNTER — Ambulatory Visit (INDEPENDENT_AMBULATORY_CARE_PROVIDER_SITE_OTHER): Payer: Medicare Other | Admitting: Internal Medicine

## 2018-07-15 VITALS — BP 150/61 | HR 78 | Temp 97.0°F | Ht 63.0 in | Wt 111.6 lb

## 2018-07-15 DIAGNOSIS — K5909 Other constipation: Secondary | ICD-10-CM | POA: Diagnosis not present

## 2018-07-15 DIAGNOSIS — K219 Gastro-esophageal reflux disease without esophagitis: Secondary | ICD-10-CM | POA: Diagnosis not present

## 2018-07-15 DIAGNOSIS — K3184 Gastroparesis: Secondary | ICD-10-CM

## 2018-07-15 NOTE — Patient Instructions (Signed)
Continue prilosec 20 mg daily  Continue benefiber and stool Colace daily  Gastroparesis (lazy stomach diet)  Office visit in 1 year
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
## 2018-07-31 DIAGNOSIS — L0202 Furuncle of face: Secondary | ICD-10-CM | POA: Diagnosis not present

## 2018-09-01 ENCOUNTER — Ambulatory Visit (INDEPENDENT_AMBULATORY_CARE_PROVIDER_SITE_OTHER): Payer: Medicare Other | Admitting: Otolaryngology

## 2018-09-01 DIAGNOSIS — H6983 Other specified disorders of Eustachian tube, bilateral: Secondary | ICD-10-CM

## 2018-09-01 DIAGNOSIS — J3489 Other specified disorders of nose and nasal sinuses: Secondary | ICD-10-CM

## 2018-09-01 DIAGNOSIS — J310 Chronic rhinitis: Secondary | ICD-10-CM

## 2018-09-11 ENCOUNTER — Other Ambulatory Visit (HOSPITAL_COMMUNITY): Payer: Self-pay | Admitting: Neurology

## 2018-09-11 ENCOUNTER — Ambulatory Visit (HOSPITAL_COMMUNITY)
Admission: RE | Admit: 2018-09-11 | Discharge: 2018-09-11 | Disposition: A | Discharge status: Home / Self Care | Payer: Medicare Other | Source: Ambulatory Visit | Attending: Neurology | Admitting: Neurology

## 2018-09-11 DIAGNOSIS — M79604 Pain in right leg: Secondary | ICD-10-CM | POA: Diagnosis not present

## 2018-09-11 DIAGNOSIS — M130 Polyarthritis, unspecified: Secondary | ICD-10-CM | POA: Diagnosis not present

## 2018-09-11 DIAGNOSIS — M5416 Radiculopathy, lumbar region: Secondary | ICD-10-CM | POA: Diagnosis not present

## 2018-09-11 DIAGNOSIS — M8000XD Age-related osteoporosis with current pathological fracture, unspecified site, subsequent encounter for fracture with routine healing: Secondary | ICD-10-CM | POA: Diagnosis not present

## 2018-09-11 DIAGNOSIS — S72011A Unspecified intracapsular fracture of right femur, initial encounter for closed fracture: Secondary | ICD-10-CM | POA: Diagnosis not present

## 2018-09-11 DIAGNOSIS — M25552 Pain in left hip: Secondary | ICD-10-CM | POA: Insufficient documentation

## 2018-09-11 DIAGNOSIS — X58XXXA Exposure to other specified factors, initial encounter: Secondary | ICD-10-CM | POA: Diagnosis not present

## 2018-09-11 DIAGNOSIS — S3992XA Unspecified injury of lower back, initial encounter: Secondary | ICD-10-CM | POA: Diagnosis not present

## 2018-09-11 DIAGNOSIS — M545 Low back pain, unspecified: Secondary | ICD-10-CM

## 2018-09-11 DIAGNOSIS — M4856XA Collapsed vertebra, not elsewhere classified, lumbar region, initial encounter for fracture: Secondary | ICD-10-CM | POA: Insufficient documentation

## 2018-09-11 DIAGNOSIS — M25551 Pain in right hip: Secondary | ICD-10-CM | POA: Insufficient documentation

## 2018-09-11 DIAGNOSIS — M5415 Radiculopathy, thoracolumbar region: Secondary | ICD-10-CM | POA: Diagnosis not present

## 2018-09-15 ENCOUNTER — Encounter: Payer: Self-pay | Admitting: *Deleted

## 2018-09-16 ENCOUNTER — Ambulatory Visit (INDEPENDENT_AMBULATORY_CARE_PROVIDER_SITE_OTHER): Payer: Medicare Other | Admitting: Orthopaedic Surgery

## 2018-09-16 ENCOUNTER — Encounter (INDEPENDENT_AMBULATORY_CARE_PROVIDER_SITE_OTHER): Payer: Self-pay | Admitting: Orthopaedic Surgery

## 2018-09-16 VITALS — BP 145/67 | HR 74 | Ht 63.0 in | Wt 112.0 lb

## 2018-09-16 DIAGNOSIS — S72001A Fracture of unspecified part of neck of right femur, initial encounter for closed fracture: Secondary | ICD-10-CM

## 2018-09-16 HISTORY — DX: Fracture of unspecified part of neck of right femur, initial encounter for closed fracture: S72.001A
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
## 2018-09-19 NOTE — Pre-Procedure Instructions (Signed)
Kara [name]  [birth year]      [id number] - [place], [place] - [address] [place] [address] Phone: [phone] Fax: [phone]    Your procedure is scheduled on Wednesday December 4.  Report to [facility] Admitting at 1:30 P.M.  Call this number if you have problems the morning of surgery:  [phone]   Remember:  Do not eat or drink after midnight.    Take these medicines the morning of surgery with A SIP OF WATER:   Gabapentin (neurontin) Omeprazole (prilosec) Klonopin if needed Tylenol if needed OR Percocet if needed Eye drops if needed  7 days prior to surgery STOP taking any Aleve, Naproxen, Ibuprofen, Motrin, Advil, Goody's, BC's, all herbal medications, fish oil, and all vitamins  FOLLOW your surgeon's instructions on stopping Aspirin. If no instructions given, please call your surgeon's office.     Do not wear jewelry, make-up or nail polish.  Do not wear lotions, powders, or perfumes, or deodorant.  Do not shave 48 hours prior to surgery.  Men may shave face and neck.  Do not bring valuables to the hospital.  [facility] is not responsible for any belongings or valuables.  Contacts, dentures or bridgework may not be worn into surgery.  Leave your suitcase in the car.  After surgery it may be brought to your room.  For patients admitted to the hospital, discharge time will be determined by your treatment team.  Patients discharged the day of surgery will not be allowed to drive home.   Special instructions:     [facility]- Preparing For Surgery  Before surgery, you can play an important role. Because skin is not sterile, your skin needs to be as free of germs as possible. You can reduce the number of germs on your skin by washing with CHG (chlorahexidine gluconate) Soap before surgery.  CHG is an antiseptic cleaner which kills germs and bonds with the skin to continue killing germs even  after washing.    Oral Hygiene is also important to reduce your risk of infection.  Remember - BRUSH YOUR TEETH THE MORNING OF SURGERY WITH YOUR REGULAR TOOTHPASTE  Please do not use if you have an allergy to CHG or antibacterial soaps. If your skin becomes reddened/irritated stop using the CHG.  Do not shave (including legs and underarms) for at least 48 hours prior to first CHG shower. It is OK to shave your face.  Please follow these instructions carefully.   1. Shower the NIGHT BEFORE SURGERY and the MORNING OF SURGERY with CHG.   2. If you chose to wash your hair, wash your hair first as usual with your normal shampoo.  3. After you shampoo, rinse your hair and body thoroughly to remove the shampoo.  4. Use CHG as you would any other liquid soap. You can apply CHG directly to the skin and wash gently with a scrungie or a clean washcloth.   5. Apply the CHG Soap to your body ONLY FROM THE NECK DOWN.  Do not use on open wounds or open sores. Avoid contact with your eyes, ears, mouth and genitals (private parts). Wash Face and genitals (private parts)  with your normal soap.  6. Wash thoroughly, paying special attention to the area where your surgery will be performed.  7. Thoroughly rinse your body with warm water from the neck down.  8. DO NOT shower/wash with your normal soap after  using and rinsing off the CHG Soap.  9. Pat yourself dry with a CLEAN TOWEL.  10. Wear CLEAN PAJAMAS to bed the night before surgery, wear comfortable clothes the morning of surgery  11. Place CLEAN SHEETS on your bed the night of your first shower and DO NOT SLEEP WITH PETS.    Day of Surgery:  Do not apply any deodorants/lotions.  Please wear clean clothes to the hospital/surgery center.   Remember to brush your teeth WITH YOUR REGULAR TOOTHPASTE.    Please read over the following fact sheets that you were given. Coughing and Deep Breathing, Total Joint Packet, MRSA Information and Surgical  Site Infection Prevention

## 2018-09-22 ENCOUNTER — Ambulatory Visit (HOSPITAL_COMMUNITY)
Admission: RE | Admit: 2018-09-22 | Discharge: 2018-09-22 | Disposition: A | Discharge status: Home / Self Care | Payer: Medicare Other | Source: Ambulatory Visit | Attending: Surgery | Admitting: Surgery

## 2018-09-22 ENCOUNTER — Encounter (HOSPITAL_COMMUNITY)
Admission: RE | Admit: 2018-09-22 | Discharge: 2018-09-22 | Disposition: A | Discharge status: Home / Self Care | Payer: Medicare Other | Source: Ambulatory Visit | Attending: Orthopaedic Surgery | Admitting: Orthopaedic Surgery

## 2018-09-22 ENCOUNTER — Other Ambulatory Visit: Payer: Self-pay

## 2018-09-22 ENCOUNTER — Encounter (HOSPITAL_COMMUNITY): Payer: Self-pay

## 2018-09-22 DIAGNOSIS — K219 Gastro-esophageal reflux disease without esophagitis: Secondary | ICD-10-CM | POA: Diagnosis not present

## 2018-09-22 DIAGNOSIS — Z01818 Encounter for other preprocedural examination: Secondary | ICD-10-CM

## 2018-09-22 DIAGNOSIS — K589 Irritable bowel syndrome without diarrhea: Secondary | ICD-10-CM | POA: Diagnosis not present

## 2018-09-22 DIAGNOSIS — M5431 Sciatica, right side: Secondary | ICD-10-CM | POA: Diagnosis not present

## 2018-09-22 DIAGNOSIS — I2510 Atherosclerotic heart disease of native coronary artery without angina pectoris: Secondary | ICD-10-CM | POA: Diagnosis not present

## 2018-09-22 DIAGNOSIS — F17290 Nicotine dependence, other tobacco product, uncomplicated: Secondary | ICD-10-CM | POA: Diagnosis not present

## 2018-09-22 DIAGNOSIS — E041 Nontoxic single thyroid nodule: Secondary | ICD-10-CM | POA: Diagnosis not present

## 2018-09-22 DIAGNOSIS — E785 Hyperlipidemia, unspecified: Secondary | ICD-10-CM | POA: Diagnosis not present

## 2018-09-22 DIAGNOSIS — J302 Other seasonal allergic rhinitis: Secondary | ICD-10-CM | POA: Diagnosis not present

## 2018-09-22 DIAGNOSIS — S72011A Unspecified intracapsular fracture of right femur, initial encounter for closed fracture: Secondary | ICD-10-CM | POA: Diagnosis not present

## 2018-09-22 DIAGNOSIS — D62 Acute posthemorrhagic anemia: Secondary | ICD-10-CM | POA: Diagnosis not present

## 2018-09-22 DIAGNOSIS — G8929 Other chronic pain: Secondary | ICD-10-CM | POA: Diagnosis not present

## 2018-09-22 DIAGNOSIS — M542 Cervicalgia: Secondary | ICD-10-CM | POA: Diagnosis not present

## 2018-09-22 LAB — COMPREHENSIVE METABOLIC PANEL
ALK PHOS: 89 U/L (ref 38–126)
ALT: 13 U/L (ref 0–44)
AST: 26 U/L (ref 15–41)
Albumin: 3.4 g/dL — ABNORMAL LOW (ref 3.5–5.0)
Anion gap: 12 (ref 5–15)
CHLORIDE: 100 mmol/L (ref 98–111)
CO2: 25 mmol/L (ref 22–32)
CREATININE: 0.86 mg/dL (ref 0.44–1.00)
Calcium: 9.4 mg/dL (ref 8.9–10.3)
GFR calc Af Amer: >60 mL/min (ref >60)
GFR, EST NON AFRICAN AMERICAN: 60 mL/min — AB (ref >60)
Glucose, Bld: 104 mg/dL — ABNORMAL HIGH (ref 70–99)
Potassium: 3.9 mmol/L (ref 3.5–5.1)
Sodium: 137 mmol/L (ref 135–145)
Total Bilirubin: 0.6 mg/dL (ref 0.3–1.2)
Total Protein: 7 g/dL (ref 6.5–8.1)

## 2018-09-22 LAB — CBC
HEMATOCRIT: 36.1 % (ref 36.0–46.0)
HEMOGLOBIN: 10.8 g/dL — AB (ref 12.0–15.0)
MCH: 29.8 pg (ref 26.0–34.0)
MCHC: 29.9 g/dL — ABNORMAL LOW (ref 30.0–36.0)
MCV: 99.7 fL (ref 80.0–100.0)
PLATELETS: 502 10*3/uL — AB (ref 150–400)
RBC: 3.62 MIL/uL — AB (ref 3.87–5.11)
RDW: 17 % — ABNORMAL HIGH (ref 11.5–15.5)
WBC: 6.6 10*3/uL (ref 4.0–10.5)
nRBC: 0 % (ref 0.0–0.2)

## 2018-09-22 LAB — URINALYSIS, ROUTINE W REFLEX MICROSCOPIC
BILIRUBIN URINE: NEGATIVE
Glucose, UA: NEGATIVE mg/dL
HGB URINE DIPSTICK: NEGATIVE
Ketones, ur: NEGATIVE mg/dL
Leukocytes, UA: NEGATIVE
Nitrite: NEGATIVE
PH: 7 (ref 5.0–8.0)
Protein, ur: NEGATIVE mg/dL
SPECIFIC GRAVITY, URINE: 1.004 — AB (ref 1.005–1.030)

## 2018-09-22 LAB — SURGICAL PCR SCREEN
MRSA, PCR: NEGATIVE
Staphylococcus aureus: NEGATIVE

## 2018-09-22 NOTE — Progress Notes (Signed)
PCP: [name]  Cardiologist: Dr. @ [facility]  DM: denies  SA: denies  Pt denies SOB, cough, fever, chest pain  Pt stated understanding of instructions given for DOS

## 2018-09-23 MED ORDER — VANCOMYCIN HCL IN DEXTROSE 1-5 GM/200ML-% IV SOLN
1000.0000 mg | INTRAVENOUS | Status: AC
  Administered 2018-09-24: 1000 mg via INTRAVENOUS
  Filled 2018-09-23: qty 200

## 2018-09-24 ENCOUNTER — Encounter (HOSPITAL_COMMUNITY): Payer: Self-pay | Admitting: *Deleted

## 2018-09-24 ENCOUNTER — Inpatient Hospital Stay (HOSPITAL_COMMUNITY): Payer: Medicare Other

## 2018-09-24 ENCOUNTER — Encounter (HOSPITAL_COMMUNITY)
Admission: RE | Disposition: A | Discharge status: Skilled Nursing Facility | Payer: Self-pay | Source: Home / Self Care | Attending: Orthopaedic Surgery

## 2018-09-24 ENCOUNTER — Inpatient Hospital Stay (HOSPITAL_COMMUNITY): Payer: Medicare Other | Admitting: Certified Registered Nurse Anesthetist

## 2018-09-24 ENCOUNTER — Inpatient Hospital Stay (HOSPITAL_COMMUNITY)
Admission: RE | Admit: 2018-09-24 | Discharge: 2018-09-29 | DRG: 470 | Disposition: A | Discharge status: Skilled Nursing Facility | Payer: Medicare Other | Attending: Orthopaedic Surgery | Admitting: Orthopaedic Surgery

## 2018-09-24 ENCOUNTER — Other Ambulatory Visit: Payer: Self-pay

## 2018-09-24 DIAGNOSIS — G8929 Other chronic pain: Secondary | ICD-10-CM | POA: Diagnosis not present

## 2018-09-24 DIAGNOSIS — W06XXXA Fall from bed, initial encounter: Secondary | ICD-10-CM | POA: Diagnosis present

## 2018-09-24 DIAGNOSIS — F411 Generalized anxiety disorder: Secondary | ICD-10-CM | POA: Diagnosis not present

## 2018-09-24 DIAGNOSIS — Z9841 Cataract extraction status, right eye: Secondary | ICD-10-CM

## 2018-09-24 DIAGNOSIS — R279 Unspecified lack of coordination: Secondary | ICD-10-CM | POA: Diagnosis not present

## 2018-09-24 DIAGNOSIS — S72011A Unspecified intracapsular fracture of right femur, initial encounter for closed fracture: Secondary | ICD-10-CM | POA: Diagnosis not present

## 2018-09-24 DIAGNOSIS — S72001A Fracture of unspecified part of neck of right femur, initial encounter for closed fracture: Secondary | ICD-10-CM | POA: Diagnosis not present

## 2018-09-24 DIAGNOSIS — M5431 Sciatica, right side: Secondary | ICD-10-CM | POA: Diagnosis present

## 2018-09-24 DIAGNOSIS — Z86010 Personal history of colonic polyps: Secondary | ICD-10-CM | POA: Diagnosis not present

## 2018-09-24 DIAGNOSIS — D62 Acute posthemorrhagic anemia: Secondary | ICD-10-CM | POA: Diagnosis not present

## 2018-09-24 DIAGNOSIS — E041 Nontoxic single thyroid nodule: Secondary | ICD-10-CM | POA: Diagnosis present

## 2018-09-24 DIAGNOSIS — R1312 Dysphagia, oropharyngeal phase: Secondary | ICD-10-CM | POA: Diagnosis present

## 2018-09-24 DIAGNOSIS — S72001D Fracture of unspecified part of neck of right femur, subsequent encounter for closed fracture with routine healing: Secondary | ICD-10-CM | POA: Diagnosis not present

## 2018-09-24 DIAGNOSIS — S72011D Unspecified intracapsular fracture of right femur, subsequent encounter for closed fracture with routine healing: Secondary | ICD-10-CM | POA: Diagnosis not present

## 2018-09-24 DIAGNOSIS — J302 Other seasonal allergic rhinitis: Secondary | ICD-10-CM | POA: Diagnosis not present

## 2018-09-24 DIAGNOSIS — R41841 Cognitive communication deficit: Secondary | ICD-10-CM | POA: Diagnosis not present

## 2018-09-24 DIAGNOSIS — M1990 Unspecified osteoarthritis, unspecified site: Secondary | ICD-10-CM | POA: Diagnosis not present

## 2018-09-24 DIAGNOSIS — W19XXXA Unspecified fall, initial encounter: Secondary | ICD-10-CM | POA: Diagnosis not present

## 2018-09-24 DIAGNOSIS — R531 Weakness: Secondary | ICD-10-CM | POA: Diagnosis not present

## 2018-09-24 DIAGNOSIS — Z7401 Bed confinement status: Secondary | ICD-10-CM | POA: Diagnosis not present

## 2018-09-24 DIAGNOSIS — Z9842 Cataract extraction status, left eye: Secondary | ICD-10-CM | POA: Diagnosis not present

## 2018-09-24 DIAGNOSIS — K589 Irritable bowel syndrome without diarrhea: Secondary | ICD-10-CM | POA: Diagnosis not present

## 2018-09-24 DIAGNOSIS — M542 Cervicalgia: Secondary | ICD-10-CM | POA: Diagnosis present

## 2018-09-24 DIAGNOSIS — E785 Hyperlipidemia, unspecified: Secondary | ICD-10-CM | POA: Diagnosis present

## 2018-09-24 DIAGNOSIS — M25551 Pain in right hip: Secondary | ICD-10-CM | POA: Diagnosis not present

## 2018-09-24 DIAGNOSIS — S72009A Fracture of unspecified part of neck of unspecified femur, initial encounter for closed fracture: Secondary | ICD-10-CM

## 2018-09-24 DIAGNOSIS — F17290 Nicotine dependence, other tobacco product, uncomplicated: Secondary | ICD-10-CM | POA: Diagnosis present

## 2018-09-24 DIAGNOSIS — I2510 Atherosclerotic heart disease of native coronary artery without angina pectoris: Secondary | ICD-10-CM | POA: Diagnosis not present

## 2018-09-24 DIAGNOSIS — Z471 Aftercare following joint replacement surgery: Secondary | ICD-10-CM | POA: Diagnosis not present

## 2018-09-24 DIAGNOSIS — R2689 Other abnormalities of gait and mobility: Secondary | ICD-10-CM | POA: Diagnosis not present

## 2018-09-24 DIAGNOSIS — K219 Gastro-esophageal reflux disease without esophagitis: Secondary | ICD-10-CM | POA: Diagnosis not present

## 2018-09-24 DIAGNOSIS — M810 Age-related osteoporosis without current pathological fracture: Secondary | ICD-10-CM | POA: Diagnosis not present

## 2018-09-24 DIAGNOSIS — Z90721 Acquired absence of ovaries, unilateral: Secondary | ICD-10-CM

## 2018-09-24 DIAGNOSIS — M2550 Pain in unspecified joint: Secondary | ICD-10-CM | POA: Diagnosis not present

## 2018-09-24 DIAGNOSIS — Z8249 Family history of ischemic heart disease and other diseases of the circulatory system: Secondary | ICD-10-CM

## 2018-09-24 DIAGNOSIS — D126 Benign neoplasm of colon, unspecified: Secondary | ICD-10-CM | POA: Diagnosis not present

## 2018-09-24 DIAGNOSIS — K449 Diaphragmatic hernia without obstruction or gangrene: Secondary | ICD-10-CM | POA: Diagnosis not present

## 2018-09-24 DIAGNOSIS — Z96641 Presence of right artificial hip joint: Secondary | ICD-10-CM | POA: Diagnosis not present

## 2018-09-24 DIAGNOSIS — M6281 Muscle weakness (generalized): Secondary | ICD-10-CM | POA: Diagnosis not present

## 2018-09-24 DIAGNOSIS — Z09 Encounter for follow-up examination after completed treatment for conditions other than malignant neoplasm: Secondary | ICD-10-CM

## 2018-09-24 DIAGNOSIS — F419 Anxiety disorder, unspecified: Secondary | ICD-10-CM | POA: Diagnosis not present

## 2018-09-24 HISTORY — DX: Fracture of unspecified part of neck of unspecified femur, initial encounter for closed fracture: S72.009A

## 2018-09-24 HISTORY — PX: HIP ARTHROPLASTY: SHX981

## 2018-09-24 SURGERY — HEMIARTHROPLASTY, HIP, DIRECT ANTERIOR APPROACH, FOR FRACTURE
Anesthesia: General | Laterality: Right

## 2018-09-24 MED ORDER — EPHEDRINE SULFATE-NACL 50-0.9 MG/10ML-% IV SOSY
PREFILLED_SYRINGE | INTRAVENOUS | Status: DC | PRN
  Administered 2018-09-24: 10 mg via INTRAVENOUS

## 2018-09-24 MED ORDER — GABAPENTIN 100 MG PO CAPS
200.0000 mg | ORAL_CAPSULE | Freq: Two times a day (BID) | ORAL | Status: DC
  Administered 2018-09-24 – 2018-09-29 (×10): 200 mg via ORAL
  Filled 2018-09-24 (×12): qty 2

## 2018-09-24 MED ORDER — MENTHOL 3 MG MT LOZG: 1.0000 | LOZENGE | OROMUCOSAL | Status: DC | PRN

## 2018-09-24 MED ORDER — EPHEDRINE 5 MG/ML INJ
INTRAVENOUS | Status: AC
  Filled 2018-09-24: qty 10

## 2018-09-24 MED ORDER — ONDANSETRON HCL 4 MG/2ML IJ SOLN
INTRAMUSCULAR | Status: DC | PRN
  Administered 2018-09-24: 4 mg via INTRAVENOUS

## 2018-09-24 MED ORDER — ROCURONIUM BROMIDE 50 MG/5ML IV SOSY
PREFILLED_SYRINGE | INTRAVENOUS | Status: AC
  Filled 2018-09-24: qty 5

## 2018-09-24 MED ORDER — PROPOFOL 10 MG/ML IV BOLUS
INTRAVENOUS | Status: DC | PRN
  Administered 2018-09-24: 70 mg via INTRAVENOUS

## 2018-09-24 MED ORDER — LIDOCAINE 2% (20 MG/ML) 5 ML SYRINGE
INTRAMUSCULAR | Status: DC | PRN
  Administered 2018-09-24: 60 mg via INTRAVENOUS

## 2018-09-24 MED ORDER — MAGNESIUM OXIDE 400 (241.3 MG) MG PO TABS
200.0000 mg | ORAL_TABLET | Freq: Every day | ORAL | Status: DC
  Administered 2018-09-25 – 2018-09-29 (×5): 200 mg via ORAL
  Filled 2018-09-24 (×5): qty 1

## 2018-09-24 MED ORDER — FENTANYL CITRATE (PF) 250 MCG/5ML IJ SOLN
INTRAMUSCULAR | Status: AC
  Filled 2018-09-24: qty 5

## 2018-09-24 MED ORDER — FENTANYL CITRATE (PF) 100 MCG/2ML IJ SOLN
25.0000 ug | INTRAMUSCULAR | Status: DC | PRN
  Administered 2018-09-24 (×2): 50 ug via INTRAVENOUS

## 2018-09-24 MED ORDER — FENTANYL CITRATE (PF) 250 MCG/5ML IJ SOLN
INTRAMUSCULAR | Status: DC | PRN
  Administered 2018-09-24: 50 ug via INTRAVENOUS
  Administered 2018-09-24: 75 ug via INTRAVENOUS
  Administered 2018-09-24: 25 ug via INTRAVENOUS
  Administered 2018-09-24: 50 ug via INTRAVENOUS

## 2018-09-24 MED ORDER — CHLORHEXIDINE GLUCONATE 4 % EX LIQD: 60.0000 mL | Freq: Once | CUTANEOUS | Status: DC

## 2018-09-24 MED ORDER — LACTATED RINGERS IV SOLN
INTRAVENOUS | Status: DC
  Administered 2018-09-24: 14:00:00 via INTRAVENOUS

## 2018-09-24 MED ORDER — ONDANSETRON HCL 4 MG/2ML IJ SOLN
INTRAMUSCULAR | Status: AC
  Filled 2018-09-24: qty 2

## 2018-09-24 MED ORDER — LIDOCAINE 2% (20 MG/ML) 5 ML SYRINGE
INTRAMUSCULAR | Status: AC
  Filled 2018-09-24: qty 5

## 2018-09-24 MED ORDER — BUPIVACAINE HCL (PF) 0.5 % IJ SOLN
INTRAMUSCULAR | Status: DC | PRN
  Administered 2018-09-24: 10 mL

## 2018-09-24 MED ORDER — SODIUM CHLORIDE 0.9 % IV SOLN
INTRAVENOUS | Status: DC
  Administered 2018-09-24 – 2018-09-26 (×3): via INTRAVENOUS
  Administered 2018-09-28: 80 mL/h via INTRAVENOUS

## 2018-09-24 MED ORDER — CLONAZEPAM 0.25 MG PO TBDP
0.2500 mg | ORAL_TABLET | Freq: Two times a day (BID) | ORAL | Status: DC | PRN
  Administered 2018-09-24: 0.5 mg via ORAL
  Administered 2018-09-25 – 2018-09-26 (×3): 0.25 mg via ORAL
  Administered 2018-09-27 (×2): 0.5 mg via ORAL
  Administered 2018-09-28: 0.25 mg via ORAL
  Administered 2018-09-28: 0.5 mg via ORAL
  Filled 2018-09-24: qty 1
  Filled 2018-09-24: qty 2
  Filled 2018-09-24 (×3): qty 1
  Filled 2018-09-24: qty 2
  Filled 2018-09-24 (×2): qty 1

## 2018-09-24 MED ORDER — PHENOL 1.4 % MT LIQD: 1.0000 | OROMUCOSAL | Status: DC | PRN

## 2018-09-24 MED ORDER — 0.9 % SODIUM CHLORIDE (POUR BTL) OPTIME
TOPICAL | Status: DC | PRN
  Administered 2018-09-24: 1000 mL

## 2018-09-24 MED ORDER — METOCLOPRAMIDE HCL 5 MG/ML IJ SOLN
5.0000 mg | Freq: Three times a day (TID) | INTRAMUSCULAR | Status: DC | PRN
  Filled 2018-09-24: qty 2

## 2018-09-24 MED ORDER — DOCUSATE SODIUM 100 MG PO CAPS
100.0000 mg | ORAL_CAPSULE | Freq: Two times a day (BID) | ORAL | Status: DC
  Administered 2018-09-24 – 2018-09-29 (×9): 100 mg via ORAL
  Filled 2018-09-24 (×9): qty 1

## 2018-09-24 MED ORDER — ONDANSETRON HCL 4 MG PO TABS: 4.0000 mg | ORAL_TABLET | Freq: Four times a day (QID) | ORAL | Status: DC | PRN

## 2018-09-24 MED ORDER — POLYETHYLENE GLYCOL 3350 17 G PO PACK
17.0000 g | PACK | Freq: Every day | ORAL | Status: DC | PRN
  Administered 2018-09-28: 17 g via ORAL

## 2018-09-24 MED ORDER — PSYLLIUM 95 % PO PACK
1.0000 | PACK | Freq: Every day | ORAL | Status: DC
  Administered 2018-09-25 – 2018-09-28 (×4): 1 via ORAL
  Filled 2018-09-24 (×5): qty 1

## 2018-09-24 MED ORDER — ASPIRIN EC 325 MG PO TBEC
325.0000 mg | DELAYED_RELEASE_TABLET | Freq: Every day | ORAL | Status: DC
  Administered 2018-09-25 – 2018-09-29 (×5): 325 mg via ORAL
  Filled 2018-09-24 (×5): qty 1

## 2018-09-24 MED ORDER — METOCLOPRAMIDE HCL 5 MG PO TABS: 5.0000 mg | ORAL_TABLET | Freq: Three times a day (TID) | ORAL | Status: DC | PRN

## 2018-09-24 MED ORDER — ROCURONIUM BROMIDE 50 MG/5ML IV SOSY
PREFILLED_SYRINGE | INTRAVENOUS | Status: DC | PRN
  Administered 2018-09-24: 20 mg via INTRAVENOUS
  Administered 2018-09-24: 30 mg via INTRAVENOUS
  Administered 2018-09-24: 20 mg via INTRAVENOUS

## 2018-09-24 MED ORDER — BUPIVACAINE HCL (PF) 0.5 % IJ SOLN
INTRAMUSCULAR | Status: AC
  Filled 2018-09-24: qty 30

## 2018-09-24 MED ORDER — VANCOMYCIN HCL IN DEXTROSE 1-5 GM/200ML-% IV SOLN
1000.0000 mg | Freq: Two times a day (BID) | INTRAVENOUS | Status: AC
  Administered 2018-09-25: 1000 mg via INTRAVENOUS
  Filled 2018-09-24: qty 200

## 2018-09-24 MED ORDER — MEPERIDINE HCL 50 MG/ML IJ SOLN: 6.2500 mg | INTRAMUSCULAR | Status: DC | PRN

## 2018-09-24 MED ORDER — HYDROCODONE-ACETAMINOPHEN 5-325 MG PO TABS
1.0000 | ORAL_TABLET | Freq: Four times a day (QID) | ORAL | Status: DC | PRN
  Administered 2018-09-24 – 2018-09-27 (×9): 1 via ORAL
  Filled 2018-09-24 (×9): qty 1

## 2018-09-24 MED ORDER — NITROGLYCERIN 0.4 MG SL SUBL
0.4000 mg | SUBLINGUAL_TABLET | SUBLINGUAL | Status: DC | PRN
  Filled 2018-09-24 (×2): qty 1

## 2018-09-24 MED ORDER — DEXAMETHASONE SODIUM PHOSPHATE 10 MG/ML IJ SOLN
INTRAMUSCULAR | Status: AC
  Filled 2018-09-24: qty 1

## 2018-09-24 MED ORDER — POLYVINYL ALCOHOL 1.4 % OP SOLN
1.0000 [drp] | Freq: Three times a day (TID) | OPHTHALMIC | Status: DC | PRN
  Filled 2018-09-24: qty 15

## 2018-09-24 MED ORDER — ONDANSETRON HCL 4 MG/2ML IJ SOLN: 4.0000 mg | Freq: Once | INTRAMUSCULAR | Status: DC | PRN

## 2018-09-24 MED ORDER — SUGAMMADEX SODIUM 200 MG/2ML IV SOLN
INTRAVENOUS | Status: DC | PRN
  Administered 2018-09-24: 100 mg via INTRAVENOUS

## 2018-09-24 MED ORDER — FENTANYL CITRATE (PF) 100 MCG/2ML IJ SOLN
INTRAMUSCULAR | Status: AC
  Administered 2018-09-24: 50 ug via INTRAVENOUS
  Filled 2018-09-24: qty 2

## 2018-09-24 MED ORDER — PANTOPRAZOLE SODIUM 40 MG PO TBEC
40.0000 mg | DELAYED_RELEASE_TABLET | Freq: Every day | ORAL | Status: DC
  Administered 2018-09-25 – 2018-09-29 (×5): 40 mg via ORAL
  Filled 2018-09-24 (×5): qty 1

## 2018-09-24 MED ORDER — DEXAMETHASONE SODIUM PHOSPHATE 10 MG/ML IJ SOLN
INTRAMUSCULAR | Status: DC | PRN
  Administered 2018-09-24: 10 mg via INTRAVENOUS

## 2018-09-24 MED ORDER — PROPOFOL 10 MG/ML IV BOLUS
INTRAVENOUS | Status: AC
  Filled 2018-09-24: qty 20

## 2018-09-24 MED ORDER — ONDANSETRON HCL 4 MG/2ML IJ SOLN: 4.0000 mg | Freq: Four times a day (QID) | INTRAMUSCULAR | Status: DC | PRN

## 2018-09-24 SURGICAL SUPPLY — 69 items
APL SKNCLS STERI-STRIP NONHPOA (GAUZE/BANDAGES/DRESSINGS) ×1
BENZOIN TINCTURE PRP APPL 2/3 (GAUZE/BANDAGES/DRESSINGS) ×3 IMPLANT
BLADE CLIPPER SURG (BLADE) IMPLANT
BLADE SAW SAG 73X25 THK (BLADE) ×1
BLADE SAW SGTL 73X25 THK (BLADE) ×2 IMPLANT
BRUSH FEMORAL CANAL (MISCELLANEOUS) IMPLANT
CLOSURE WOUND 1/2 X4 (GAUZE/BANDAGES/DRESSINGS) ×2
COVER BACK TABLE 24X17X13 BIG (DRAPES) IMPLANT
COVER WAND RF STERILE (DRAPES) ×3 IMPLANT
DRAPE IMP U-DRAPE 54X76 (DRAPES) ×3 IMPLANT
DRAPE INCISE IOBAN 66X45 STRL (DRAPES) IMPLANT
DRAPE ORTHO SPLIT 77X108 STRL (DRAPES) ×4
DRAPE SURG ORHT 6 SPLT 77X108 (DRAPES) ×2 IMPLANT
DRAPE U-SHAPE 47X51 STRL (DRAPES) ×3 IMPLANT
DRSG MEPILEX BORDER 4X12 (GAUZE/BANDAGES/DRESSINGS) ×3 IMPLANT
DRSG MEPILEX BORDER 4X8 (GAUZE/BANDAGES/DRESSINGS) ×3 IMPLANT
DRSG PAD ABDOMINAL 8X10 ST (GAUZE/BANDAGES/DRESSINGS) ×6 IMPLANT
DURAPREP 26ML APPLICATOR (WOUND CARE) ×3 IMPLANT
ELECT CAUTERY BLADE 6.4 (BLADE) ×3 IMPLANT
ELECT REM PT RETURN 9FT ADLT (ELECTROSURGICAL) ×3
ELECTRODE REM PT RTRN 9FT ADLT (ELECTROSURGICAL) ×1 IMPLANT
EVACUATOR 1/8 PVC DRAIN (DRAIN) IMPLANT
FACESHIELD WRAPAROUND (MASK) ×6 IMPLANT
GAUZE SPONGE 4X4 12PLY STRL (GAUZE/BANDAGES/DRESSINGS) ×3 IMPLANT
GAUZE SPONGE 4X4 12PLY STRL LF (GAUZE/BANDAGES/DRESSINGS) ×3 IMPLANT
GAUZE XEROFORM 5X9 LF (GAUZE/BANDAGES/DRESSINGS) ×3 IMPLANT
GLOVE BIOGEL PI IND STRL 8 (GLOVE) ×2 IMPLANT
GLOVE BIOGEL PI INDICATOR 8 (GLOVE) ×4
GLOVE ORTHO TXT STRL SZ7.5 (GLOVE) ×6 IMPLANT
GOWN STRL REUS W/ TWL LRG LVL3 (GOWN DISPOSABLE) ×1 IMPLANT
GOWN STRL REUS W/ TWL XL LVL3 (GOWN DISPOSABLE) ×1 IMPLANT
GOWN STRL REUS W/TWL 2XL LVL3 (GOWN DISPOSABLE) ×3 IMPLANT
GOWN STRL REUS W/TWL LRG LVL3 (GOWN DISPOSABLE) ×3
GOWN STRL REUS W/TWL XL LVL3 (GOWN DISPOSABLE) ×3
HANDPIECE INTERPULSE COAX TIP (DISPOSABLE)
HEAD FEM UNIPOLAR 49 OD STRL (Hips) ×3 IMPLANT
IMMOBILIZER KNEE 22 UNIV (SOFTGOODS) ×3 IMPLANT
KIT BASIN OR (CUSTOM PROCEDURE TRAY) ×3 IMPLANT
KIT TURNOVER KIT B (KITS) ×3 IMPLANT
MANIFOLD NEPTUNE II (INSTRUMENTS) ×3 IMPLANT
NDL SUT 2 .5 CRC MAYO 1.732X (NEEDLE) ×1 IMPLANT
NEEDLE HYPO 25GX1X1/2 BEV (NEEDLE) ×3 IMPLANT
NEEDLE MAYO TAPER (NEEDLE) ×3
NS IRRIG 1000ML POUR BTL (IV SOLUTION) ×3 IMPLANT
PACK TOTAL JOINT (CUSTOM PROCEDURE TRAY) ×3 IMPLANT
PACK UNIVERSAL I (CUSTOM PROCEDURE TRAY) ×3 IMPLANT
PAD ABD 8X10 STRL (GAUZE/BANDAGES/DRESSINGS) ×3 IMPLANT
PAD ARMBOARD 7.5X6 YLW CONV (MISCELLANEOUS) ×6 IMPLANT
PIN STEINMAN 3/16 (PIN) ×3 IMPLANT
SET HNDPC FAN SPRY TIP SCT (DISPOSABLE) IMPLANT
SPACER FEM TAPERED +0 12/14 (Hips) ×3 IMPLANT
SPONGE LAP 4X18 RFD (DISPOSABLE) ×6 IMPLANT
STAPLER VISISTAT 35W (STAPLE) ×3 IMPLANT
STEM SUMMIT BASIC PRESSFIT SZ4 (Hips) ×3 IMPLANT
STRIP CLOSURE SKIN 1/2X4 (GAUZE/BANDAGES/DRESSINGS) ×4 IMPLANT
SUCTION FRAZIER HANDLE 10FR (MISCELLANEOUS) ×2
SUCTION TUBE FRAZIER 10FR DISP (MISCELLANEOUS) ×1 IMPLANT
SUT ETHIBOND NAB CT1 #1 30IN (SUTURE) ×9 IMPLANT
SUT TICRON (SUTURE) ×6 IMPLANT
SUT VIC AB 2-0 CT1 27 (SUTURE) ×6
SUT VIC AB 2-0 CT1 TAPERPNT 27 (SUTURE) ×3 IMPLANT
SUT VICRYL 0 TIES 12 18 (SUTURE) ×3 IMPLANT
SYR CONTROL 10ML LL (SYRINGE) ×3 IMPLANT
TAPE CLOTH SURG 6X10 WHT LF (GAUZE/BANDAGES/DRESSINGS) ×3 IMPLANT
TOWEL OR 17X24 6PK STRL BLUE (TOWEL DISPOSABLE) ×3 IMPLANT
TOWEL OR 17X26 10 PK STRL BLUE (TOWEL DISPOSABLE) ×3 IMPLANT
TOWER CARTRIDGE SMART MIX (DISPOSABLE) IMPLANT
TRAY FOLEY MTR SLVR 16FR STAT (SET/KITS/TRAYS/PACK) IMPLANT
WATER STERILE IRR 1000ML POUR (IV SOLUTION) ×12 IMPLANT

## 2018-09-24 NOTE — Op Note (Signed)
Preop diagnosis: Right subacute femoral neck fracture.  Closed.  Postop diagnosis: Same  Procedure: Right hip hemiarthroplasty for femoral neck fracture.  Surgeon: Rodell Perna, MD  Assistant: Benjiman Core, PA-C medically necessary and present for exposure and and implantation of the components and medically necessary.  Anesthesia: General.  EBL: Less than 100 cc  Brief history 84 year old female seen a couple days before Thanksgiving with several week history of right groin pain difficulty walking progressed to the point where she had to hop and use her walker and was minimally ambulatory.  X-rays been taken on the 22nd in the emergency room and then she went home with valgus impacted angulated right femoral neck fracture.  Patient requested waiting till after Thanksgiving so she can be with her family and presents today for hemiarthroplasty so she can continue weightbearing as tolerated.  Procedure after induction general anesthesia orotracheal ovation placed placed laterally with right side up axillary roll 1015 drapes and usual total hip sheets drapes impervious stockinette Betadine Steri-Drape x2 ceiling the skin and sterile skin marker.  Posterior approach was made after timeout procedure.  Vancomycin to be given prophylactically due to the patient's allergy to amoxicillin.  Charnley retractor was placed.  Gluteus maximus split in line with the fibers piriformis identified tagged cut posterior capsule was opened and impacted femoral neck fracture was exposed neck was cut 1 fingerbreadth above the lesser trochanter sequential progressing up to #4 size after trial ball of been inserted 49 mm gave good suction fit.  Initially the +0 neck could not be reduced.  Prosthesis was removed and an additional 2 mm was taken off the neck hip was able be reduced leg lengths were equal there is stability with flexion and 90 internal rotation 80 degrees holding ankle and hip abduction.  By knee palpation leg  lengths were ankle equal.  The acetabulum showed no degenerative changes.  Piriformis was repaired to the gluteus medius #1 Ethibond in the gluteus maximus fascia #1 Vicryl in the gluteus maximus fascia reapproximation 2-0 Vicryl subtenons tissue skin staple closure 10 cc Marcaine infiltration postop dressing and closure.

## 2018-09-24 NOTE — Transfer of Care (Signed)
Immediate Anesthesia Transfer of Care Note  Patient: Kara [name]  Procedure(s) Performed: RIGHT HIP HEMIARTHROPLASTY-POSTERIOR APPROACH (Right )  Patient Location: PACU  Anesthesia Type:General  Level of Consciousness: awake, alert  and oriented  Airway & Oxygen Therapy: Patient Spontanous Breathing and Patient connected to nasal cannula oxygen  Post-op Assessment: Report given to RN and Post -op Vital signs reviewed and stable  Post vital signs: Reviewed and stable  Last Vitals:  Vitals Value Taken Time  BP 153/78 09/24/2018  5:48 PM  Temp    Pulse 64 09/24/2018  5:48 PM  Resp 12 09/24/2018  5:48 PM  SpO2 100 % 09/24/2018  5:48 PM  Vitals shown include unvalidated device data.  Last Pain:  Vitals:   09/24/18 1748  TempSrc:   PainSc: (P) 7       Patients Stated Pain Goal: 4 (76/16/07 3710)  Complications: No apparent anesthesia complications
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
## 2018-09-24 NOTE — Anesthesia Preprocedure Evaluation (Signed)
Anesthesia Evaluation  Patient identified by MRN, date of birth, ID band Patient awake    Reviewed: Allergy & Precautions, NPO status , Patient's Chart, lab work & pertinent test results  Airway Mallampati: II  TM Distance: >3 FB Neck ROM: Full    Dental  (+) Edentulous Upper, Edentulous Lower   Pulmonary neg pulmonary ROS,    Pulmonary exam normal breath sounds clear to auscultation       Cardiovascular + CAD  Normal cardiovascular exam Rhythm:Regular Rate:Normal     Neuro/Psych Anxiety Right sided sciatica  Neuromuscular disease    GI/Hepatic Neg liver ROS, hiatal hernia, GERD  Medicated and Controlled,  Endo/Other  Hyperlipidemia  Renal/GU negative Renal ROS  negative genitourinary   Musculoskeletal  (+) Arthritis , Osteoarthritis,  Right Femoral neck Fx Chronic LBP with right radiculopathy Hx/o Compression Fx Lumbar spine x 2    Abdominal   Peds  Hematology negative hematology ROS (+)   Anesthesia Other Findings   Reproductive/Obstetrics                             Anesthesia Physical Anesthesia Plan  ASA: III  Anesthesia Plan: General   Post-op Pain Management:    Induction: Intravenous  PONV Risk Score and Plan: 4 or greater and Ondansetron, Dexamethasone and Treatment may vary due to age or medical condition  Airway Management Planned: Oral ETT  Additional Equipment:   Intra-op Plan:   Post-operative Plan: Extubation in OR  Informed Consent: I have reviewed the patients History and Physical, chart, labs and discussed the procedure including the risks, benefits and alternatives for the proposed anesthesia with the patient or authorized representative who has indicated his/her understanding and acceptance.   Dental advisory given  Plan Discussed with: CRNA and Surgeon  Anesthesia Plan Comments:         Anesthesia Quick Evaluation

## 2018-09-24 NOTE — Anesthesia Postprocedure Evaluation (Signed)
Anesthesia Post Note  Patient: Kara [name]  Procedure(s) Performed: RIGHT HIP HEMIARTHROPLASTY-POSTERIOR APPROACH (Right )     Patient location during evaluation: PACU Anesthesia Type: General Level of consciousness: awake and alert Pain management: pain level controlled Vital Signs Assessment: post-procedure vital signs reviewed and stable Respiratory status: spontaneous breathing, nonlabored ventilation and respiratory function stable Cardiovascular status: blood pressure returned to baseline and stable Postop Assessment: no apparent nausea or vomiting Anesthetic complications: no    Last Vitals:  Vitals:   09/24/18 1845 09/24/18 1848  BP:  126/60  Pulse:  (!) 101  Resp:  16  Temp: 36.5 C   SpO2:  99%    Last Pain:  Vitals:   09/24/18 1841  TempSrc:   PainSc: 7                  Jamieson Hetland A.

## 2018-09-24 NOTE — Interval H&P Note (Signed)
History and Physical Interval Note:  26/04/1244 8:09 PM  Kara [name]  has presented today for surgery, with the diagnosis of subacute right femoral neck fracture  The various methods of treatment have been discussed with the patient and family. After consideration of risks, benefits and other options for treatment, the patient has consented to  Procedure(s): RIGHT HIP HEMIARTHROPLASTY-POSTERIOR APPROACH (Right) as a surgical intervention .  The patient's history has been reviewed, patient examined, no change in status, stable for surgery.  I have reviewed the patient's chart and labs.  Questions were answered to the patient's satisfaction.     [name]

## 2018-09-24 NOTE — Anesthesia Procedure Notes (Signed)
Procedure Name: Intubation Date/Time: 09/24/2018 4:31 PM Performed by: Kathryne Hitch, CRNA Pre-anesthesia Checklist: Patient identified, Emergency Drugs available, Suction available, Patient being monitored and Timeout performed Patient Re-evaluated:Patient Re-evaluated prior to induction Oxygen Delivery Method: Circle system utilized Preoxygenation: Pre-oxygenation with 100% oxygen Induction Type: IV induction Ventilation: Mask ventilation without difficulty Laryngoscope Size: Miller and 2 Grade View: Grade I Tube type: Oral Tube size: 7.0 mm Number of attempts: 1 Airway Equipment and Method: Patient positioned with wedge pillow Placement Confirmation: ETT inserted through vocal cords under direct vision,  positive ETCO2 and breath sounds checked- equal and bilateral Secured at: 21 cm Tube secured with: Tape Dental Injury: Teeth and Oropharynx as per pre-operative assessment

## 2018-09-25 LAB — BASIC METABOLIC PANEL
Anion gap: 10 (ref 5–15)
BUN: 8 mg/dL (ref 8–23)
CO2: 26 mmol/L (ref 22–32)
Calcium: 8.9 mg/dL (ref 8.9–10.3)
Chloride: 104 mmol/L (ref 98–111)
Creatinine, Ser: 0.71 mg/dL (ref 0.44–1.00)
GFR calc Af Amer: >60 mL/min (ref >60)
GFR calc non Af Amer: >60 mL/min (ref >60)
Glucose, Bld: 187 mg/dL — ABNORMAL HIGH (ref 70–99)
Potassium: 4 mmol/L (ref 3.5–5.1)
Sodium: 140 mmol/L (ref 135–145)

## 2018-09-25 LAB — CBC
HCT: 27.7 % — ABNORMAL LOW (ref 36.0–46.0)
Hemoglobin: 8.6 g/dL — ABNORMAL LOW (ref 12.0–15.0)
MCH: 30.2 pg (ref 26.0–34.0)
MCHC: 31 g/dL (ref 30.0–36.0)
MCV: 97.2 fL (ref 80.0–100.0)
Platelets: 419 10*3/uL — ABNORMAL HIGH (ref 150–400)
RBC: 2.85 MIL/uL — ABNORMAL LOW (ref 3.87–5.11)
RDW: 17.2 % — ABNORMAL HIGH (ref 11.5–15.5)
WBC: 12.6 10*3/uL — ABNORMAL HIGH (ref 4.0–10.5)
nRBC: 0 % (ref 0.0–0.2)

## 2018-09-25 NOTE — Plan of Care (Signed)
  Problem: Pain Managment: Goal: General experience of comfort will improve Outcome: Progressing   Problem: Safety: Goal: Ability to remain free from injury will improve Outcome: Progressing   

## 2018-09-25 NOTE — Progress Notes (Signed)
   Subjective: 1 Day Post-Op Procedure(s) (LRB): RIGHT HIP HEMIARTHROPLASTY-POSTERIOR APPROACH (Right) Patient reports pain as moderate and severe.    Objective: Vital signs in last 24 hours: Temp:  [97.7 F (36.5 C)-98.7 F (37.1 C)] 98.7 F (37.1 C) (12/05 0437) Pulse Rate:  [64-117] 108 (12/05 0437) Resp:  [12-22] 16 (12/05 0437) BP: (101-154)/(55-91) 101/60 (12/05 0437) SpO2:  [98 %-100 %] 100 % (12/05 0437) FiO2 (%):  [21 %] 21 % (12/04 1959) Weight:  [50.8 kg] 50.8 kg (12/04 1352)  Intake/Output from previous day: 12/04 0701 - 12/05 0700 In: 700 [I.V.:700] Out: 100 [Blood:100] Intake/Output this shift: No intake/output data recorded.  Recent Labs    09/22/18 1352 09/25/18 0146  HGB 10.8* 8.6*   Recent Labs    09/22/18 1352 09/25/18 0146  WBC 6.6 12.6*  RBC 3.62* 2.85*  HCT 36.1 27.7*  PLT 502* 419*   Recent Labs    09/22/18 1352 09/25/18 0146  NA 137 140  K 3.9 4.0  CL 100 104  CO2 25 26  BUN <5* 8  CREATININE 0.86 0.71  GLUCOSE 104* 187*  CALCIUM 9.4 8.9   No results for input(s): LABPT, INR in the last 72 hours.  Neurologically intact Dg Hip Port Unilat With Pelvis 1v Right  Result Date: 09/24/2018 CLINICAL DATA:  Postop right hip arthroplasty. EXAM: DG HIP (WITH OR WITHOUT PELVIS) 1V PORT RIGHT COMPARISON:  None. FINDINGS: Uncemented bipolar right hip arthroplasty with expected postop soft tissue changes are noted about the right hip. Overlying skin staples and soft tissue emphysema are identified about the right hip. No periprosthetic fracture or hardware failure is noted. Intact native left hip. Soft tissue calcifications are seen the medial aspect of the left thigh nonspecific. IMPRESSION: Intact right bipolar uncemented hip arthroplasty without immediate postoperative complications. Electronically Signed   By: Ashley Royalty M.D.   On: 09/24/2018 20:00    Assessment/Plan: 1 Day Post-Op Procedure(s) (LRB): RIGHT HIP HEMIARTHROPLASTY-POSTERIOR  APPROACH (Right) Up with therapy. D/c knee immobilizer. Posterior hip precautions.   Kara [name] 09/25/2018, 7:49 AM

## 2018-09-25 NOTE — Progress Notes (Signed)
Pt has been c/o chest pain. She worked with PT and c/o of chest pain mid sternum that would not go away. CN gave pt pain medicine. I revaluated pt's pain level and she first stated she still has pain but "it isn't that bad". BP taken and pain level revaluated pain is now 8/10. EKG done. 1 nitroglycerin tab given @1549  pm. 1554 Pt still reports chest pain midsternum.

## 2018-09-25 NOTE — Plan of Care (Signed)
  Problem: Health Behavior/Discharge Planning: Goal: Ability to manage health-related needs will improve Outcome: Progressing   Problem: Clinical Measurements: Goal: Ability to maintain clinical measurements within normal limits will improve Outcome: Progressing Goal: Will remain free from infection Outcome: Progressing Goal: Cardiovascular complication will be avoided Outcome: Progressing   Problem: Nutrition: Goal: Adequate nutrition will be maintained Outcome: Progressing   Problem: Pain Managment: Goal: General experience of comfort will improve Outcome: Progressing

## 2018-09-25 NOTE — Evaluation (Signed)
Physical Therapy Evaluation Patient Details Name: Kara [name] MRN: [id number] DOB: 21-Jan-1927 Today's Date: 09/25/2018   History of Present Illness  Pt is 84 y.o. female s/p right hemiarthroplasty for femoral neck fracture (09/24/18), with posterior hip precautions. PMH significant for multiple compression fractures, LBP, osteoporosis, uterine prolapse, GERD, CAD, osteoarthritis, hyperlipidemia, chronic Percocet 2 tablets daily x2 years.  Clinical Impression  PTA pt was modified independent with use of 2-wheel and 4-wheel RW for ambulation, with hopes of returning home with daughter. Pt has some cognitive deficits with memory, problem solving, and safety awareness, but answers to questions are appropriate despite inability to provide clear recall of her fall at home. Treatment limited by pain, unable to ambulate today, reluctant to weight shift to RLE. She performed bed mobility and sit to stand transfers with ModA. She completed stand pivot transfer MaxA from chair to [facility] and [place] [facility] to chair with RW. Pt reports increased chest pain at end of treatment, nurse notified. Skilled therapy necessary to address deficits in strength, ROM, endurance, balance, and pain management. PT recommending DC to SNF prior to return home with daughters due to needed level of assistance and limited tolerance with mobility. PT will continue to follow acutely.     Follow Up Recommendations SNF;Supervision/Assistance - 24 hour    Equipment Recommendations  Other (comment)(tbd at next venue)       Precautions / Restrictions Precautions Precautions: Posterior Hip;Fall Precaution Booklet Issued: Yes (comment) Precaution Comments: pt may have decreased adherance to precautions given cognitive deficits Restrictions Weight Bearing Restrictions: Yes RLE Weight Bearing: Weight bearing as tolerated      Mobility  Bed Mobility Overal bed mobility: Needs Assistance Bed Mobility: Supine to Sit     Supine to  sit: Mod assist;HOB elevated     General bed mobility comments: ModA for using bed pad to scoot pt to EOB, vc for hand placement and sequencing, good use of UE on bedrail with vc  Transfers Overall transfer level: Needs assistance Equipment used: Rolling walker (2 wheeled);1 person hand held assist Transfers: Sit to/from [facility] Sit to Stand: Mod assist Stand pivot transfers: Max assist;+2 physical assistance;+2 safety/equipment       General transfer comment: Pt able to complete sit to stand from bed with hand held modA for powerup and stability during stance, pt reluctant to put weight through RLE, heavily reliant on UE. Pt then completed stand pivot transfer to [facility] with MaxA as pt did not want to move RLE. Pt completed sit to stand from toilet hand held [place] for stability, completed stand pivot transfer BSC<>chair with MaxAx2 and use of RW. Max cuing needed for sequencing, management of RW, coming to full upright in stance.    Ambulation/Gait             General Gait Details: unable to assess due to pain and limited ability to transfer.      Balance Overall balance assessment: Needs assistance Sitting-balance support: Bilateral upper extremity supported Sitting balance-Leahy Scale: Fair     Standing balance support: Bilateral upper extremity supported Standing balance-Leahy Scale: Poor                               Pertinent Vitals/Pain Pain Assessment: Faces Faces Pain Scale: Hurts whole lot Pain Location: right leg, low back, chest Pain Descriptors / Indicators: Grimacing;Guarding;Moaning;Sharp;Sore Pain Intervention(s): Limited activity within patient's tolerance;Monitored during session;Repositioned;Patient requesting pain meds-RN notified  Home Living Family/patient expects to be discharged to:: Private residence Living Arrangements: Children(daughter's home) Available Help at Discharge: Family;Available 24 hours/day Type of  Home: Mobile home Home Access: Stairs to enter   Entrance Stairs-Number of Steps: 2 Home Layout: One level;Other (Comment)(one step down between rooms inside) Home Equipment: Gilford Rile - 2 wheels;Walker - 4 wheels Additional Comments: Pt has 5 daughters, 3 available to help as needed.     Prior Function Level of Independence: Needs assistance   Gait / Transfers Assistance Needed: ambulating with 4-wheel and 2-wheel walker prior to fall  ADL's / Homemaking Assistance Needed: pt takes sponge baths at baseline.  Comments: Pt able to care for herself prior to fall; daughter has been taking care of her since fall.         Extremity/Trunk Assessment   Upper Extremity Assessment Upper Extremity Assessment: Overall WFL for tasks assessed    Lower Extremity Assessment Lower Extremity Assessment: RLE deficits/detail RLE Deficits / Details: s/p right hemiarthroplasty for femoral neck fracture.        Communication   Communication: No difficulties(speaks softly)  Cognition Arousal/Alertness: Awake/alert Behavior During Therapy: WFL for tasks assessed/performed Overall Cognitive Status: Impaired/Different from baseline Area of Impairment: Following commands;Awareness;Problem solving                       Following Commands: Follows one step commands with increased time       General Comments: Pt with some confusion and disorientation, unable to provide clear history regarding how she fell, doesn't understand why she still has pain "if they fixed it".       General Comments General comments (skin integrity, edema, etc.): Pt's daughter present for duration of treatment.         Assessment/Plan    PT Assessment Patient needs continued PT services  PT Problem List Decreased strength;Decreased range of motion;Decreased activity tolerance;Decreased balance;Decreased mobility;Decreased coordination;Decreased cognition;Decreased knowledge of use of DME;Decreased safety  awareness;Decreased knowledge of precautions;Pain       PT Treatment Interventions DME instruction;Gait training;Functional mobility training;Therapeutic activities;Therapeutic exercise;Balance training;Neuromuscular re-education;Cognitive remediation;Patient/family education    PT Goals (Current goals can be found in the Care Plan section)  Acute Rehab PT Goals Patient Stated Goal: get home PT Goal Formulation: With patient/family Time For Goal Achievement: 10/09/18 Potential to Achieve Goals: Fair    Frequency Min 3X/week    AM-PAC PT "6 Clicks" Mobility  Outcome Measure Help needed turning from your back to your side while in a flat bed without using bedrails?: A Lot Help needed moving from lying on your back to sitting on the side of a flat bed without using bedrails?: A Lot Help needed moving to and from a bed to a chair (including a wheelchair)?: A Lot Help needed standing up from a chair using your arms (e.g., wheelchair or bedside chair)?: A Lot Help needed to walk in hospital room?: Total Help needed climbing 3-5 steps with a railing? : Total 6 Click Score: 10    End of Session Equipment Utilized During Treatment: Gait belt Activity Tolerance: Patient limited by pain Patient left: in chair;with call bell/phone within reach;with chair alarm set;with family/visitor present Nurse Communication: Mobility status;Patient requests pain meds;Other (comment)(pt reports chest pain) PT Visit Diagnosis: Unsteadiness on feet (R26.81);Muscle weakness (generalized) (M62.81);History of falling (Z91.81);Difficulty in walking, not elsewhere classified (R26.2);Pain Pain - Right/Left: Right Pain - part of body: Hip;Knee;Leg(low back pain, chest)    Time: 0932-6712 PT Time Calculation (min) (ACUTE ONLY):  34 min   Charges:   PT Evaluation $PT Eval Moderate Complexity: 1 Mod PT Treatments $Therapeutic Activity: 8-22 mins        Vernell Morgans, SPT Acute Rehabilitation  Services Office [phone]   Vernell Morgans 09/25/2018, 4:45 PM

## 2018-09-26 ENCOUNTER — Encounter (HOSPITAL_COMMUNITY): Payer: Self-pay | Admitting: Orthopaedic Surgery

## 2018-09-26 DIAGNOSIS — D62 Acute posthemorrhagic anemia: Secondary | ICD-10-CM

## 2018-09-26 HISTORY — DX: Acute posthemorrhagic anemia: D62

## 2018-09-26 LAB — ABO/RH: ABO/RH(D): A POS

## 2018-09-26 LAB — PREPARE RBC (CROSSMATCH)

## 2018-09-26 LAB — CBC
HCT: 22.5 % — ABNORMAL LOW (ref 36.0–46.0)
Hemoglobin: 6.9 g/dL — CL (ref 12.0–15.0)
MCH: 30 pg (ref 26.0–34.0)
MCHC: 30.7 g/dL (ref 30.0–36.0)
MCV: 97.8 fL (ref 80.0–100.0)
PLATELETS: 307 10*3/uL (ref 150–400)
RBC: 2.3 MIL/uL — ABNORMAL LOW (ref 3.87–5.11)
RDW: 17.4 % — ABNORMAL HIGH (ref 11.5–15.5)
WBC: 14.1 10*3/uL — ABNORMAL HIGH (ref 4.0–10.5)
nRBC: 0.1 % (ref 0.0–0.2)

## 2018-09-26 MED ORDER — SODIUM CHLORIDE 0.9% IV SOLUTION: Freq: Once | INTRAVENOUS | Status: DC

## 2018-09-26 MED ORDER — ASPIRIN 325 MG PO TBEC: 325.0000 mg | DELAYED_RELEASE_TABLET | Freq: Every day | ORAL | 0 refills | Status: DC

## 2018-09-26 MED ORDER — HYDROCODONE-ACETAMINOPHEN 5-325 MG PO TABS: 1.0000 | ORAL_TABLET | Freq: Four times a day (QID) | ORAL | 0 refills | Status: DC | PRN

## 2018-09-26 NOTE — Progress Notes (Signed)
Physical Therapy Treatment Patient Details Name: Kara [name] MRN: [id number] DOB: 04-Jul-1927 Today's Date: 09/26/2018    History of Present Illness Pt is 84 y.o. female s/p right hemiarthroplasty for femoral neck fracture (09/24/18), with posterior hip precautions. PMH significant for multiple compression fractures, LBP, osteoporosis, uterine prolapse, GERD, CAD, osteoarthritis, hyperlipidemia, chronic Percocet 2 tablets daily x2 years.    PT Comments    Patient seen for mobility progression. Pt requires mod/max A +2 for bed mobility and OOB transfer to chair. Pt oriented to self only. Daughter present. Continue to progress as tolerated with anticipated d/c to SNF for further skilled PT services.      Follow Up Recommendations  SNF;Supervision/Assistance - 24 hour     Equipment Recommendations  Other (comment)(tbd at next venue)    Recommendations for Other Services       Precautions / Restrictions Precautions Precautions: Posterior Hip;Fall Precaution Comments: pt unable to recall precautions; cognitive deficits Restrictions Weight Bearing Restrictions: Yes RLE Weight Bearing: Weight bearing as tolerated    Mobility  Bed Mobility Overal bed mobility: Needs Assistance Bed Mobility: Rolling;Sidelying to Sit Rolling: Mod assist;+2 for safety/equipment Sidelying to sit: Max assist;+2 for physical assistance       General bed mobility comments: assist to roll and come into sitting, use of rail, cues for sequencing, assist to maintain precautions   Transfers Overall transfer level: Needs assistance Equipment used: (+2 face to face with gait belt and bed pad) Transfers: Sit to/from Stand;Stand Pivot Transfers Sit to Stand: Max assist;+2 physical assistance Stand pivot transfers: Max assist;+2 physical assistance;+2 safety/equipment       General transfer comment: feet blocked for safety; assist and cues for positioning prior to stand; assist with bed pad for hip  extension to stand and when pivoting; pt began pivoting feet when task initiated by therapist  Ambulation/Gait                 Stairs             Wheelchair Mobility    Modified Rankin (Stroke Patients Only)       Balance Overall balance assessment: Needs assistance Sitting-balance support: Single extremity supported;Feet supported Sitting balance-Leahy Scale: Poor Sitting balance - Comments: pt leaning onto therapist and unable to weight shift toward R side without assistance Postural control: Left lateral lean Standing balance support: Bilateral upper extremity supported Standing balance-Leahy Scale: Zero                              Cognition Arousal/Alertness: Awake/alert Behavior During Therapy: WFL for tasks assessed/performed Overall Cognitive Status: (not sure of baseline cognition)                                 General Comments: pt oriented to self only and clearly confused about situation and role of mobility to healing process       Exercises      General Comments General comments (skin integrity, edema, etc.): daughter present      Pertinent Vitals/Pain Pain Assessment: Faces Faces Pain Scale: Hurts whole lot Pain Location: right leg and chest Pain Descriptors / Indicators: Grimacing;Guarding;Moaning;Sharp;Sore Pain Intervention(s): Limited activity within patient's tolerance;Monitored during session;Repositioned    Home Living                      Prior Function  PT Goals (current goals can now be found in the care plan section) Progress towards PT goals: Not progressing toward goals - comment(limited by pain/mentation)    Frequency    Min 3X/week      PT Plan Current plan remains appropriate    Co-evaluation              AM-PAC PT "6 Clicks" Mobility   Outcome Measure  Help needed turning from your back to your side while in a flat bed without using bedrails?: A  Lot Help needed moving from lying on your back to sitting on the side of a flat bed without using bedrails?: A Lot Help needed moving to and from a bed to a chair (including a wheelchair)?: A Lot Help needed standing up from a chair using your arms (e.g., wheelchair or bedside chair)?: A Lot Help needed to walk in hospital room?: Total Help needed climbing 3-5 steps with a railing? : Total 6 Click Score: 10    End of Session Equipment Utilized During Treatment: Gait belt Activity Tolerance: Patient limited by pain Patient left: in chair;with call bell/phone within reach;with family/visitor present;Other (comment)(NT aware that chair alarm pad not in chair at this time) Nurse Communication: Mobility status;Other (comment)(daughter requests pain medication for pt) PT Visit Diagnosis: Unsteadiness on feet (R26.81);Muscle weakness (generalized) (M62.81);History of falling (Z91.81);Difficulty in walking, not elsewhere classified (R26.2);Pain Pain - Right/Left: Right Pain - part of body: Hip;Knee;Leg(low back pain, chest)     Time: 2563-8937 PT Time Calculation (min) (ACUTE ONLY): 29 min  Charges:  $Therapeutic Activity: 23-37 mins                     Earney Navy, PTA Acute Rehabilitation Services Pager: [phone] Office: [phone]     Darliss Cheney 09/26/2018, 5:25 PM

## 2018-09-26 NOTE — Progress Notes (Addendum)
   Subjective: 2 Days Post-Op Procedure(s) (LRB): RIGHT HIP HEMIARTHROPLASTY-POSTERIOR APPROACH (Right) Patient reports pain as moderate.  Pain has been a problem and was on norco bid for about one year before her fall. One unit PRBCs is infusing.   Objective: Vital signs in last 24 hours: Temp:  [98.5 F (36.9 C)-100.1 F (37.8 C)] 98.6 F (37 C) (12/06 0837) Pulse Rate:  [83-100] 93 (12/06 0837) Resp:  [14-20] 20 (12/06 0837) BP: (96-122)/(47-51) 121/47 (12/06 0837) SpO2:  [95 %-100 %] 96 % (12/06 0837)  Intake/Output from previous day: 12/05 0701 - 12/06 0700 In: 2569.9 [I.V.:2569.9] Out: 150 [Urine:150] Intake/Output this shift: Total I/O In: 275 [Blood:275] Out: -   Recent Labs    09/25/18 0146 09/26/18 0113  HGB 8.6* 6.9*   Recent Labs    09/25/18 0146 09/26/18 0113  WBC 12.6* 14.1*  RBC 2.85* 2.30*  HCT 27.7* 22.5*  PLT 419* 307   Recent Labs    09/25/18 0146  NA 140  K 4.0  CL 104  CO2 26  BUN 8  CREATININE 0.71  GLUCOSE 187*  CALCIUM 8.9   No results for input(s): LABPT, INR in the last 72 hours.  Neurologically intact No results found.  Assessment/Plan: 2 Days Post-Op Procedure(s) (LRB): RIGHT HIP HEMIARTHROPLASTY-POSTERIOR APPROACH (Right) Up with therapy, family requested plastic incentive spirometry. Needs OOB  Kara [name] 09/26/2018, 9:56 AM

## 2018-09-26 NOTE — Progress Notes (Signed)
Administered one unit of PRBC's without adverse reaction. Pt IV line leaking blood. IV removed and IV consult placed for second unit of blood. Daughter at bedside and updated on plan of care and pt condition.

## 2018-09-26 NOTE — Progress Notes (Signed)
Hemoglobin this morning 6.9.  Orders to give 2 units PRBC's.  Awaiting type and cross from lab.  Patient denies complaints at this time.

## 2018-09-26 NOTE — Plan of Care (Signed)
  Problem: Education: Goal: Knowledge of General Education information will improve Description Including pain rating scale, medication(s)/side effects and non-pharmacologic comfort measures Outcome: Progressing   

## 2018-09-26 NOTE — Discharge Instructions (Signed)
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
## 2018-09-27 LAB — TYPE AND SCREEN
ABO/RH(D): A POS
Antibody Screen: NEGATIVE
UNIT DIVISION: 0
Unit division: 0

## 2018-09-27 LAB — CBC
HCT: 28.7 % — ABNORMAL LOW (ref 36.0–46.0)
Hemoglobin: 9.7 g/dL — ABNORMAL LOW (ref 12.0–15.0)
MCH: 31.6 pg (ref 26.0–34.0)
MCHC: 33.8 g/dL (ref 30.0–36.0)
MCV: 93.5 fL (ref 80.0–100.0)
Platelets: 205 10*3/uL (ref 150–400)
RBC: 3.07 MIL/uL — ABNORMAL LOW (ref 3.87–5.11)
RDW: 16.6 % — ABNORMAL HIGH (ref 11.5–15.5)
WBC: 12.7 10*3/uL — ABNORMAL HIGH (ref 4.0–10.5)
nRBC: 0.4 % — ABNORMAL HIGH (ref 0.0–0.2)

## 2018-09-27 LAB — BPAM RBC
Blood Product Expiration Date: 201912242359
Blood Product Expiration Date: 201912242359
ISSUE DATE / TIME: 201912060811
ISSUE DATE / TIME: 201912061155
Unit Type and Rh: 6200
Unit Type and Rh: 6200

## 2018-09-27 MED ORDER — HYDROCODONE-ACETAMINOPHEN 5-325 MG PO TABS
1.0000 | ORAL_TABLET | Freq: Four times a day (QID) | ORAL | Status: DC | PRN
  Administered 2018-09-27 (×2): 2 via ORAL
  Administered 2018-09-27: 1 via ORAL
  Administered 2018-09-28 – 2018-09-29 (×5): 2 via ORAL
  Filled 2018-09-27 (×7): qty 2
  Filled 2018-09-27: qty 1

## 2018-09-27 MED ORDER — ACETAMINOPHEN 325 MG PO TABS
650.0000 mg | ORAL_TABLET | Freq: Four times a day (QID) | ORAL | Status: DC | PRN
  Administered 2018-09-27: 650 mg via ORAL
  Filled 2018-09-27: qty 2

## 2018-09-27 NOTE — Progress Notes (Signed)
   Subjective: 3 Days Post-Op Procedure(s) (LRB): RIGHT HIP HEMIARTHROPLASTY-POSTERIOR APPROACH (Right) Patient reports pain as moderate and severe.  Afraid to move since she is worried about pain. Tightens up when anything done to her, even if not involving right hip that had surgery.   Objective: Vital signs in last 24 hours: Temp:  [98.2 F (36.8 C)-101.6 F (38.7 C)] 99.1 F (37.3 C) (12/07 0627) Pulse Rate:  [91-107] 97 (12/07 0426) Resp:  [16-20] 16 (12/07 0426) BP: (106-147)/(46-73) 145/46 (12/07 0426) SpO2:  [95 %-99 %] 95 % (12/07 0426)  Intake/Output from previous day: 12/06 0701 - 12/07 0700 In: 904.6 [I.V.:50; Blood:854.6] Out: -  Intake/Output this shift: No intake/output data recorded.  Recent Labs    09/25/18 0146 09/26/18 0113 09/27/18 0453  HGB 8.6* 6.9* 9.7*   Recent Labs    09/26/18 0113 09/27/18 0453  WBC 14.1* 12.7*  RBC 2.30* 3.07*  HCT 22.5* 28.7*  PLT 307 205   Recent Labs    09/25/18 0146  NA 140  K 4.0  CL 104  CO2 26  BUN 8  CREATININE 0.71  GLUCOSE 187*  CALCIUM 8.9   No results for input(s): LABPT, INR in the last 72 hours.  Neurologically intact No results found.  Assessment/Plan: 3 Days Post-Op Procedure(s) (LRB): RIGHT HIP HEMIARTHROPLASTY-POSTERIOR APPROACH (Right) Up with therapy Anemia corrected.   Plan bed to chair.  Increased norco to 1 to 2 po q 6hrs since she was already on one po bid before admission.  Kara [name] 09/27/2018, 9:21 AM

## 2018-09-27 NOTE — Clinical Social Work Note (Signed)
Clinical Social Work Assessment  Patient Details  Name: Kara [name] MRN: [id number] Date of Birth: November 16, 1926  Date of referral:  09/27/18               Reason for consult:  Discharge Planning                Permission sought to share information with:  Case Manager, Facility Sport and exercise psychologist, Family Supports Permission granted to share information::  Yes, Verbal Permission Granted  Name::     Advertising copywriter::  SNFs  Relationship::  daughter  Contact Information:  [phone]  Housing/Transportation Living arrangements for the past 2 months:  [place] of Information:  Patient Patient Interpreter Needed:  None Criminal Activity/Legal Involvement Pertinent to Current Situation/Hospitalization:  No - Comment as needed Significant Relationships:  Adult Children Lives with:  Self Do you feel safe going back to the place where you live?  No Need for family participation in patient care:  Yes (Comment)  Care giving concerns:  CSW received referral for possible SNF placement at time of discharge. Spoke with patient's daughter Kara [name] at bedside regarding possibility of SNF placement . Patient's family is currently unable to care for her at their home given patient's current needs and fall risk.  Patient and  Daughter Kara [name] expressed understanding of PT recommendation and are agreeable to SNF placement at time of discharge. CSW to continue to follow and assist with discharge planning needs.     Social Worker assessment / plan:  Spoke with patient and daughter Kara [name] at bedside  concerning possibility of rehab at [facility] before returning home.    Employment status:  Retired Forensic scientist:  Medicare PT Recommendations:  [place] / Referral to community resources:  [place]  Patient/Family's Response to care:  Patient and  Daughter Kara [name]  recognize need for rehab before returning home and are agreeable to a SNF in  [place] near their home. They report preference for  [facility]     . CSW explained insurance authorization process. CSW provided daughter at bedside with Medicare.gov nursing home list of facilities in [place] including quality measure ratings and scores for each facility. Hard copy placed in patient chart. Patient's family reported that they want patient to get stronger to be able to come back home.    Patient/Family's Understanding of and Emotional Response to Diagnosis, Current Treatment, and Prognosis:  Patient/family is realistic regarding therapy needs and expressed being hopeful for SNF placement. Patient expressed understanding of CSW role and discharge process as well as medical condition. No questions/concerns about plan or treatment.    Emotional Assessment Appearance:  Appears stated age Attitude/Demeanor/Rapport:  Unable to Assess(patient just recieved pain pill and sleeping, daughter at bedside) Affect (typically observed):  Unable to Assess Orientation:  Oriented to Self, Oriented to Situation Alcohol / Substance use:  Not Applicable Psych involvement (Current and /or in the community):  No (Comment)  Discharge Needs  Concerns to be addressed:  Discharge Planning Concerns Readmission within the last 30 days:  No Current discharge risk:  Dependent with Mobility Barriers to Discharge:  Continued Medical Work up   [employer], LCSW 09/27/2018, 10:04 AM

## 2018-09-27 NOTE — Progress Notes (Signed)
Physical Therapy Treatment Patient Details Name: Kara [name] MRN: [id number] DOB: 1927-04-20 Today's Date: 09/27/2018    History of Present Illness Pt is 84 y.o. female s/p right hemiarthroplasty for femoral neck fracture (09/24/18), with posterior hip precautions. PMH significant for multiple compression fractures, LBP, osteoporosis, uterine prolapse, GERD, CAD, osteoarthritis, hyperlipidemia, chronic Percocet 2 tablets daily x2 years.    PT Comments    Pt slow and guarded and anxious.  Pt is unaware that she has a fracture that has been repaired.  Required increased time and effort to achieve sitting.  Pt with posterior bias in sitting and standing. Pt required decreased assistance with use of stedy but continues to require SNF placement based on her strength and functional deficits.  Plan next session for continued transfer training and strength training.      Follow Up Recommendations  SNF;Supervision/Assistance - 24 hour     Equipment Recommendations  Other (comment)(TBD at next venue)    Recommendations for Other Services       Precautions / Restrictions Precautions Precautions: Posterior Hip;Fall Precaution Booklet Issued: Yes (comment) Precaution Comments: pt unable to recall precautions; cognitive deficits Restrictions Weight Bearing Restrictions: Yes RLE Weight Bearing: Weight bearing as tolerated    Mobility  Bed Mobility Overal bed mobility: Needs Assistance Bed Mobility: Rolling;Sidelying to Sit   Sidelying to sit: Max assist;+2 for physical assistance Supine to sit: Mod assist;HOB elevated     General bed mobility comments: Pt required cues for hand placement assistance to advance LEs to edge of bed and to elevate trunk into sitting.    Transfers Overall transfer level: Needs assistance Equipment used: Ambulation equipment used(sara stedy used ) Transfers: Sit to/from Stand Sit to Stand: Mod assist;+2 physical assistance         General transfer  comment: Cues for foot placement on stedy platform, cues and assistance to weight shift forward to allow for hand placement on stedy cross bar.  Pt required mod assistance to boost into standing from elevated surface height of the bed.  Pt with decreased trunk extension and posterior bias.   Ambulation/Gait Ambulation/Gait assistance: (NT)               Stairs             Wheelchair Mobility    Modified Rankin (Stroke Patients Only)       Balance Overall balance assessment: Needs assistance   Sitting balance-Leahy Scale: Poor       Standing balance-Leahy Scale: Zero                              Cognition Arousal/Alertness: Awake/alert Behavior During Therapy: WFL for tasks assessed/performed Overall Cognitive Status: Difficult to assess Area of Impairment: Following commands;Awareness;Problem solving                       Following Commands: Follows one step commands with increased time       General Comments: pt oriented to self only and clearly confused about situation and role of mobility to healing process       Exercises Total Joint Exercises Ankle Circles/Pumps: AROM;Both;10 reps;Supine Quad Sets: AROM;Both;Supine;10 reps Heel Slides: AROM;Both;10 reps;Supine Long Arc Quad: AROM;Both;10 reps;Supine General Exercises - Upper Extremity Shoulder Flexion: AROM;Both;10 reps;Supine Shoulder Extension: AROM;Both;Supine;10 reps Elbow Flexion: AROM;10 reps;Both;Supine Elbow Extension: AROM;Both;Supine;10 reps    General Comments        Pertinent Vitals/Pain Pain Assessment:  Faces Faces Pain Scale: Hurts even more Pain Location: R leg and R rib cage, but generalized discomfort in joints noted as well.   Pain Descriptors / Indicators: Grimacing;Guarding;Moaning;Sharp;Sore Pain Intervention(s): Monitored during session;Repositioned;Ice applied    Home Living                      Prior Function            PT Goals  (current goals can now be found in the care plan section) Acute Rehab PT Goals Patient Stated Goal: get home PT Goal Formulation: With patient/family Potential to Achieve Goals: Fair Progress towards PT goals: Progressing toward goals    Frequency    Min 3X/week      PT Plan Current plan remains appropriate    Co-evaluation              AM-PAC PT "6 Clicks" Mobility   Outcome Measure  Help needed turning from your back to your side while in a flat bed without using bedrails?: A Lot Help needed moving from lying on your back to sitting on the side of a flat bed without using bedrails?: A Lot Help needed moving to and from a bed to a chair (including a wheelchair)?: A Lot Help needed standing up from a chair using your arms (e.g., wheelchair or bedside chair)?: A Lot Help needed to walk in hospital room?: Total Help needed climbing 3-5 steps with a railing? : Total 6 Click Score: 10    End of Session Equipment Utilized During Treatment: Gait belt Activity Tolerance: Patient limited by pain Patient left: in chair;with call bell/phone within reach;with family/visitor present;Other (comment)(maxi move pad placed under patient for back to bed educated staff to recline sling to maintain posterior precautions.  ) Nurse Communication: Mobility status;Other (comment) PT Visit Diagnosis: Unsteadiness on feet (R26.81);Muscle weakness (generalized) (M62.81);History of falling (Z91.81);Difficulty in walking, not elsewhere classified (R26.2);Pain Pain - Right/Left: Right Pain - part of body: Hip;Knee;Leg     Time: 7482-7078 PT Time Calculation (min) (ACUTE ONLY): 37 min  Charges:  $Therapeutic Exercise: 8-22 mins $Therapeutic Activity: 8-22 mins                     Governor Rooks, PTA Acute Rehabilitation Services Pager [phone] Office [phone]     Lavern Maslow Eli Hose 09/27/2018, 3:04 PM

## 2018-09-27 NOTE — NC FL2 (Signed)
[place] MEDICAID FL2 LEVEL OF CARE SCREENING TOOL     IDENTIFICATION  Patient Name: Kara [name] Birthdate: 10/17/1927 Sex: female Admission Date (Current Location): 09/24/2018  [facility] and [place] Number:  [employer] and Address:  [facility], [place] [address] [place], [place] [address]      Provider Number: [id number]  Attending Physician Name and Address:  [name], MD  Relative Name and Phone Number:  Vito Backers (daughter) [phone]    Current Level of Care: Hospital Recommended Level of Care: [place] Prior Approval Number:    Date Approved/Denied: 09/27/18 PASRR Number: [id number]  Discharge Plan: SNF    Current Diagnoses: Patient Active Problem List   Diagnosis Date Noted  . Acute blood loss anemia 09/26/2018  . Hip fracture ([place]) 09/24/2018  . Femoral neck fracture ([place]) 09/24/2018  . Closed fracture of neck of right femur ([place]) 09/16/2018  . Trochanteric bursitis, right hip 08/02/2017  . Closed compression fracture of L5 lumbar vertebra 05/30/2017  . Sciatica of right side 04/25/2017  . Closed compression fracture of L3 lumbar vertebra 04/04/2017  . CAD (coronary atherosclerotic disease) 03/26/2017  . Pulmonary nodule, left 03/26/2017  . Early satiety 05/29/2016  . Allergic rhinitis 10/28/2015  . Schatzki's ring   . Hiatal hernia   . Oropharyngeal dysphagia 02/03/2015  . Unsteady gait 01/15/2014  . Back pain without radiation 01/15/2014  . Thyroid cyst 12/08/2013  . Cystocele, midline 09/22/2013  . IGT (impaired glucose tolerance) 07/26/2013  . Shoulder pain, left 10/10/2012  . IBS (irritable bowel syndrome) 07/01/2012  . Arthritis 08/26/2011  . Tubular adenoma of colon 06/06/2011  . Diverticulosis 03/29/2011  . GERD (gastroesophageal reflux disease) 03/29/2011  . Seasonal allergic rhinitis 01/25/2010  . Neck pain, bilateral 03/02/2009  . Uterine prolapse 08/11/2008  .  Hyperlipidemia LDL goal <100 01/15/2008  . Age-related osteoporosis with current pathological fracture 01/15/2008    Orientation RESPIRATION BLADDER Height & Weight     Self, Place  Normal Continent Weight: 50.8 kg Height:  5\' 2"  (157.5 cm)  BEHAVIORAL SYMPTOMS/MOOD NEUROLOGICAL BOWEL NUTRITION STATUS      Continent Diet(see discharge summary)  AMBULATORY STATUS COMMUNICATION OF NEEDS Skin   Extensive Assist Verbally Surgical wounds(right hip closed surgical incision)                       Personal Care Assistance Level of Assistance  Bathing, Feeding, Dressing, Total care Bathing Assistance: Maximum assistance Feeding assistance: Independent Dressing Assistance: Maximum assistance Total Care Assistance: Maximum assistance   Functional Limitations Info  Sight, Hearing, Speech Sight Info: Adequate Hearing Info: Adequate Speech Info: Adequate    SPECIAL CARE FACTORS FREQUENCY  PT (By licensed PT), OT (By licensed OT)     PT Frequency: min 5x weekly OT Frequency: min 5x weekly            Contractures Contractures Info: Not present    Additional Factors Info  Code Status, Allergies Code Status Info: DNR Allergies Info: Allergies:  Amoxicillin-pot Clavulanate, Tramadol, Other, Carafate Sucralfate, Cetirizine Hcl, Mometasone Furoate           Current Medications (09/27/2018):  This is the current hospital active medication list Current Facility-Administered Medications  Medication Dose Route Frequency Provider Last Rate Last Dose  . 0.9 %  sodium chloride infusion (Manually program via Guardrails IV Fluids)   Intravenous Once Dwana Melena L, PA-C      . 0.9 %  sodium chloride infusion   Intravenous Continuous Lanae Crumbly, PA-C 80 mL/hr at 09/26/18 1653    . acetaminophen (TYLENOL) tablet 650 mg  650 mg Oral Q6H PRN Newt Minion, MD   650 mg at 09/27/18 0509  . aspirin EC tablet 325 mg  325 mg Oral Q breakfast Lanae Crumbly, PA-C   325 mg at 09/27/18 0840   . clonazePAM (KLONOPIN) disintegrating tablet 0.25-0.5 mg  0.25-0.5 mg Oral BID PRN Lanae Crumbly, PA-C   0.5 mg at 09/27/18 0400  . docusate sodium (COLACE) capsule 100 mg  100 mg Oral BID Lanae Crumbly, PA-C   100 mg at 09/26/18 2044  . gabapentin (NEURONTIN) capsule 200 mg  200 mg Oral BID Lanae Crumbly, PA-C   200 mg at 09/26/18 2044  . HYDROcodone-acetaminophen (NORCO/VICODIN) 5-325 MG per tablet 1-2 tablet  1-2 tablet Oral Q6H PRN [name], MD      . lactated ringers infusion   Intravenous Continuous Josephine Igo, MD 10 mL/hr at 09/24/18 1425    . magnesium oxide (MAG-OX) tablet 200 mg  200 mg Oral Daily Lanae Crumbly, PA-C   200 mg at 09/26/18 2831  . menthol-cetylpyridinium (CEPACOL) lozenge 3 mg  1 lozenge Oral PRN Lanae Crumbly, PA-C       Or  . phenol (CHLORASEPTIC) mouth spray 1 spray  1 spray Mouth/Throat PRN Lanae Crumbly, PA-C      . metoCLOPramide (REGLAN) tablet 5 mg  5 mg Oral Q8H PRN Lanae Crumbly, PA-C       Or  . metoCLOPramide (REGLAN) injection 5 mg  5 mg Intravenous Q8H PRN Benjiman Core M, PA-C      . nitroGLYCERIN (NITROSTAT) SL tablet 0.4 mg  0.4 mg Sublingual Q5 min PRN Lanae Crumbly, PA-C      . ondansetron [facility]) tablet 4 mg  4 mg Oral Q6H PRN Lanae Crumbly, PA-C       Or  . ondansetron [facility]) injection 4 mg  4 mg Intravenous Q6H PRN Benjiman Core M, PA-C      . pantoprazole (PROTONIX) EC tablet 40 mg  40 mg Oral Daily Benjiman Core M, PA-C   40 mg at 09/26/18 0827  . polyethylene glycol (MIRALAX / GLYCOLAX) packet 17 g  17 g Oral Daily PRN Benjiman Core M, PA-C      . polyvinyl alcohol (LIQUIFILM TEARS) 1.4 % ophthalmic solution 1 drop  1 drop Both Eyes TID PRN Lanae Crumbly, PA-C      . psyllium (HYDROCIL/METAMUCIL) packet 1 packet  1 packet Oral Daily Lanae Crumbly, PA-C   1 packet at 09/26/18 1000     Discharge Medications: Please see discharge summary for a list of discharge medications.  Relevant Imaging Results:  Relevant Lab  Results:   Additional Information SSN: [id number]  Alberteen Sam, LCSW

## 2018-09-27 NOTE — Progress Notes (Addendum)
Temp to 101.6.  Orders for tylenol, encourage use of incentive, continue to encourage mobility.

## 2018-09-28 MED ORDER — POLYETHYLENE GLYCOL 3350 17 G PO PACK
17.0000 g | PACK | Freq: Every day | ORAL | Status: DC
  Administered 2018-09-29: 17 g via ORAL
  Filled 2018-09-28 (×2): qty 1

## 2018-09-28 NOTE — Progress Notes (Signed)
Subjective: 4 Days Post-Op Procedure(s) (LRB): RIGHT HIP HEMIARTHROPLASTY-POSTERIOR APPROACH (Right) Patient sleeping but awakens to voice. Complains of pain over the hip. Daughter reports poor po intake and concerned the dressing to the hip has not been changed. Daughter requests regular diet and we have changed to regular diet this am.   Objective: Vital signs in last 24 hours: Temp:  [98.2 F (36.8 C)-99.5 F (37.5 C)] 99.5 F (37.5 C) (12/08 0609) Pulse Rate:  [86-108] 108 (12/08 0603) Resp:  [15-19] 16 (12/08 0603) BP: (134-150)/(52-66) 149/65 (12/08 0603) SpO2:  [96 %-98 %] 96 % (12/08 0603)  Intake/Output from previous day: 12/07 0701 - 12/08 0700 In: 594 [P.O.:594] Out: -  Intake/Output this shift: No intake/output data recorded.  Recent Labs    09/26/18 0113 09/27/18 0453  HGB 6.9* 9.7*   Recent Labs    09/26/18 0113 09/27/18 0453  WBC 14.1* 12.7*  RBC 2.30* 3.07*  HCT 22.5* 28.7*  PLT 307 205   No results for input(s): NA, K, CL, CO2, BUN, CREATININE, GLUCOSE, CALCIUM in the last 72 hours. No results for input(s): LABPT, INR in the last 72 hours.  Thin elderly frail female lying in bed in NAD.  Right hip incision dry and intact.     Assessment/Plan: 4 Days Post-Op Procedure(s) (LRB): RIGHT HIP HEMIARTHROPLASTY-POSTERIOR APPROACH (Right) Mobilize with PT.  Diet changed to regular to encourage better po's. Dressing change to right hip today.  Plan DC to SNF .   [place]

## 2018-09-28 NOTE — Progress Notes (Signed)
Patient's daughter requesting additional sleep medication and alternative stool softener medication for patient. MD notified. MD returned call and ordered no additional sleep medication as this would not prove beneficial to the patient's recommended outcome considering the pain has received Norco, Klonopin, and Gabapentin. Miralax ordered for bowel regime. Nursing will continue to monitor.

## 2018-09-28 NOTE — Progress Notes (Signed)
The old dressing was removed as ordered per [place] PA  Staples in place.  Incision appears to be healing well.  No drainage noted.  Dressing reapplied.

## 2018-09-29 ENCOUNTER — Other Ambulatory Visit: Payer: Self-pay

## 2018-09-29 ENCOUNTER — Non-Acute Institutional Stay (SKILLED_NURSING_FACILITY): Payer: Medicare Other | Admitting: Internal Medicine

## 2018-09-29 ENCOUNTER — Inpatient Hospital Stay
Admission: RE | Admit: 2018-09-29 | Discharge: 2018-10-11 | Disposition: A | Discharge status: Home / Self Care | Payer: Medicare Other | Source: Ambulatory Visit | Attending: Internal Medicine | Admitting: Internal Medicine

## 2018-09-29 ENCOUNTER — Encounter: Payer: Self-pay | Admitting: Internal Medicine

## 2018-09-29 DIAGNOSIS — Z7401 Bed confinement status: Secondary | ICD-10-CM | POA: Diagnosis not present

## 2018-09-29 DIAGNOSIS — F419 Anxiety disorder, unspecified: Secondary | ICD-10-CM | POA: Diagnosis not present

## 2018-09-29 DIAGNOSIS — Z96641 Presence of right artificial hip joint: Secondary | ICD-10-CM | POA: Diagnosis not present

## 2018-09-29 DIAGNOSIS — R2689 Other abnormalities of gait and mobility: Secondary | ICD-10-CM | POA: Diagnosis not present

## 2018-09-29 DIAGNOSIS — M6281 Muscle weakness (generalized): Secondary | ICD-10-CM | POA: Diagnosis not present

## 2018-09-29 DIAGNOSIS — R52 Pain, unspecified: Secondary | ICD-10-CM | POA: Diagnosis not present

## 2018-09-29 DIAGNOSIS — M2550 Pain in unspecified joint: Secondary | ICD-10-CM | POA: Diagnosis not present

## 2018-09-29 DIAGNOSIS — S72011D Unspecified intracapsular fracture of right femur, subsequent encounter for closed fracture with routine healing: Secondary | ICD-10-CM | POA: Diagnosis not present

## 2018-09-29 DIAGNOSIS — Z471 Aftercare following joint replacement surgery: Secondary | ICD-10-CM | POA: Diagnosis not present

## 2018-09-29 DIAGNOSIS — E876 Hypokalemia: Secondary | ICD-10-CM | POA: Diagnosis not present

## 2018-09-29 DIAGNOSIS — D62 Acute posthemorrhagic anemia: Secondary | ICD-10-CM

## 2018-09-29 DIAGNOSIS — D126 Benign neoplasm of colon, unspecified: Secondary | ICD-10-CM | POA: Diagnosis not present

## 2018-09-29 DIAGNOSIS — I2510 Atherosclerotic heart disease of native coronary artery without angina pectoris: Secondary | ICD-10-CM

## 2018-09-29 DIAGNOSIS — M25561 Pain in right knee: Secondary | ICD-10-CM | POA: Diagnosis not present

## 2018-09-29 DIAGNOSIS — K449 Diaphragmatic hernia without obstruction or gangrene: Secondary | ICD-10-CM | POA: Diagnosis not present

## 2018-09-29 DIAGNOSIS — J189 Pneumonia, unspecified organism: Principal | ICD-10-CM

## 2018-09-29 DIAGNOSIS — F411 Generalized anxiety disorder: Secondary | ICD-10-CM | POA: Diagnosis not present

## 2018-09-29 DIAGNOSIS — S72001D Fracture of unspecified part of neck of right femur, subsequent encounter for closed fracture with routine healing: Secondary | ICD-10-CM | POA: Diagnosis not present

## 2018-09-29 DIAGNOSIS — M542 Cervicalgia: Secondary | ICD-10-CM | POA: Diagnosis not present

## 2018-09-29 DIAGNOSIS — R05 Cough: Secondary | ICD-10-CM | POA: Diagnosis not present

## 2018-09-29 DIAGNOSIS — K219 Gastro-esophageal reflux disease without esophagitis: Secondary | ICD-10-CM

## 2018-09-29 DIAGNOSIS — R509 Fever, unspecified: Secondary | ICD-10-CM | POA: Diagnosis not present

## 2018-09-29 DIAGNOSIS — R279 Unspecified lack of coordination: Secondary | ICD-10-CM | POA: Diagnosis not present

## 2018-09-29 DIAGNOSIS — M1990 Unspecified osteoarthritis, unspecified site: Secondary | ICD-10-CM | POA: Diagnosis not present

## 2018-09-29 DIAGNOSIS — T148XXA Other injury of unspecified body region, initial encounter: Secondary | ICD-10-CM | POA: Diagnosis not present

## 2018-09-29 DIAGNOSIS — M810 Age-related osteoporosis without current pathological fracture: Secondary | ICD-10-CM | POA: Diagnosis not present

## 2018-09-29 DIAGNOSIS — R41841 Cognitive communication deficit: Secondary | ICD-10-CM | POA: Diagnosis not present

## 2018-09-29 DIAGNOSIS — W19XXXA Unspecified fall, initial encounter: Secondary | ICD-10-CM | POA: Diagnosis not present

## 2018-09-29 DIAGNOSIS — R531 Weakness: Secondary | ICD-10-CM | POA: Diagnosis not present

## 2018-09-29 DIAGNOSIS — R5383 Other fatigue: Secondary | ICD-10-CM | POA: Diagnosis not present

## 2018-09-29 DIAGNOSIS — M25551 Pain in right hip: Secondary | ICD-10-CM | POA: Diagnosis not present

## 2018-09-29 DIAGNOSIS — M79651 Pain in right thigh: Secondary | ICD-10-CM | POA: Diagnosis not present

## 2018-09-29 DIAGNOSIS — G8929 Other chronic pain: Secondary | ICD-10-CM | POA: Diagnosis not present

## 2018-09-29 DIAGNOSIS — E785 Hyperlipidemia, unspecified: Secondary | ICD-10-CM | POA: Diagnosis not present

## 2018-09-29 DIAGNOSIS — S72001A Fracture of unspecified part of neck of right femur, initial encounter for closed fracture: Secondary | ICD-10-CM | POA: Diagnosis not present

## 2018-09-29 MED ORDER — CLONAZEPAM 0.5 MG PO TABS: 0.2500 mg | ORAL_TABLET | Freq: Two times a day (BID) | ORAL | 0 refills | Status: DC | PRN

## 2018-09-29 MED ORDER — HYDROCODONE-ACETAMINOPHEN 5-325 MG PO TABS: 1.0000 | ORAL_TABLET | Freq: Four times a day (QID) | ORAL | 0 refills | Status: DC | PRN

## 2018-09-29 MED ORDER — POLYETHYLENE GLYCOL 3350 17 G PO PACK: 17.0000 g | PACK | Freq: Every day | ORAL | 0 refills | Status: DC

## 2018-09-29 NOTE — Care Management Important Message (Signed)
Important Message  Patient Details  Name: Kara [name] MRN: [id number] Date of Birth: 09-18-1927   Medicare Important Message Given:  Yes  IM signed/given on 09/26/2018   Orbie Pyo 09/29/2018, 8:23 AM

## 2018-09-29 NOTE — Plan of Care (Signed)
  Problem: Education: Goal: Knowledge of General Education information will improve Description: Including pain rating scale, medication(s)/side effects and non-pharmacologic comfort measures Outcome: Progressing   Problem: Clinical Measurements: Goal: Ability to maintain clinical measurements within normal limits will improve Outcome: Progressing   Problem: Elimination: Goal: Will not experience complications related to bowel motility Outcome: Progressing   Problem: Safety: Goal: Ability to remain free from injury will improve Outcome: Progressing   Problem: Skin Integrity: Goal: Risk for impaired skin integrity will decrease Outcome: Progressing   
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
## 2018-09-29 NOTE — Progress Notes (Signed)
Pt discharging to [facility] center. Report called in to [facility] LPN. Pt left unit via [facility].

## 2018-09-29 NOTE — Telephone Encounter (Signed)
RX Fax for [facility]@ [phone]  

## 2018-09-29 NOTE — Progress Notes (Addendum)
Patient will DC to: [place] date: 09/29/18 Family notified: [name] Transport by: Corey Harold  Per MD patient ready for DC to [facility]. RN, patient, patient's family, and facility notified of DC. Discharge Summary sent to facility. RN given number for report [phone] Room 130. DC packet on chart. Ambulance transport requested for patient.  CSW signing off.  [place], [place]

## 2018-09-29 NOTE — Clinical Social Work Placement (Signed)
   CLINICAL SOCIAL WORK PLACEMENT  NOTE  Date:  09/29/2018  Patient Details  Name: Kara [name] MRN: [id number] Date of Birth: January 28, 1927  Clinical Social Work is seeking post-discharge placement for this patient at the [place] level of care (*CSW will initial, date and re-position this form in  chart as items are completed):      Patient/family provided with [facility] Work Department's list of facilities offering this level of care within the geographic area requested by the patient (or if unable, by the patient's family).  Yes   Patient/family informed of their freedom to choose among providers that offer the needed level of care, that participate in Medicare, Medicaid or managed care program needed by the patient, have an available bed and are willing to accept the patient.      Patient/family informed of [facility]'s ownership interest in [facility] and [facility], as well as of the fact that they are under no obligation to receive care at these facilities.  PASRR submitted to EDS on       PASRR number received on 09/27/18     Existing PASRR number confirmed on       FL2 transmitted to all facilities in geographic area requested by pt/family on 09/27/18     FL2 transmitted to all facilities within larger geographic area on       Patient informed that his/her managed care company has contracts with or will negotiate with certain facilities, including the following:        Yes   Patient/family informed of bed offers received.  Patient chooses bed at [facility]     Physician recommends and patient chooses bed at      Patient to be transferred to [facility] on 09/29/18.  Patient to be transferred to facility by [facility]     Patient family notified on 09/29/18 of transfer.  Name of family member notified:  [name] (daughter)     PHYSICIAN       Additional Comment:     _______________________________________________ Alberteen Sam, LCSW 09/29/2018, 11:39 AM

## 2018-09-29 NOTE — Progress Notes (Signed)
Patient ID: Kara [name], female   DOB: 08/19/1927, 84 y.o.   MRN: [id number]   Patient seen by Dr. Lorin Mercy this morning.  She is ready for transfer to skilled nurse facility.  Prescriptions for Norco (pain) and aspirin (postop DVT prophylaxis) on chart.  Follow-up when she is about 2 weeks postop with Dr. Lorin Mercy for recheck.  Return sooner if needed.

## 2018-09-30 ENCOUNTER — Encounter (HOSPITAL_COMMUNITY)
Admission: RE | Admit: 2018-09-30 | Discharge: 2018-09-30 | Disposition: A | Discharge status: Home / Self Care | Payer: Medicare Other | Source: Skilled Nursing Facility | Attending: Internal Medicine | Admitting: Internal Medicine

## 2018-09-30 ENCOUNTER — Encounter: Payer: Self-pay | Admitting: Internal Medicine

## 2018-09-30 ENCOUNTER — Non-Acute Institutional Stay (SKILLED_NURSING_FACILITY): Payer: Medicare Other | Admitting: Internal Medicine

## 2018-09-30 DIAGNOSIS — R509 Fever, unspecified: Secondary | ICD-10-CM

## 2018-09-30 DIAGNOSIS — N390 Urinary tract infection, site not specified: Secondary | ICD-10-CM | POA: Insufficient documentation

## 2018-09-30 DIAGNOSIS — R5383 Other fatigue: Secondary | ICD-10-CM | POA: Diagnosis not present

## 2018-09-30 DIAGNOSIS — Z471 Aftercare following joint replacement surgery: Secondary | ICD-10-CM | POA: Insufficient documentation

## 2018-09-30 DIAGNOSIS — Z96641 Presence of right artificial hip joint: Secondary | ICD-10-CM | POA: Insufficient documentation

## 2018-09-30 DIAGNOSIS — D62 Acute posthemorrhagic anemia: Secondary | ICD-10-CM | POA: Diagnosis not present

## 2018-09-30 DIAGNOSIS — D126 Benign neoplasm of colon, unspecified: Secondary | ICD-10-CM | POA: Insufficient documentation

## 2018-09-30 LAB — CBC WITH DIFFERENTIAL/PLATELET
Abs Immature Granulocytes: 0.03 10*3/uL (ref 0.00–0.07)
BASOS PCT: 1 %
Basophils Absolute: 0 10*3/uL (ref 0.0–0.1)
Eosinophils Absolute: 0.2 10*3/uL (ref 0.0–0.5)
Eosinophils Relative: 2 %
HCT: 32.3 % — ABNORMAL LOW (ref 36.0–46.0)
Hemoglobin: 10.4 g/dL — ABNORMAL LOW (ref 12.0–15.0)
Immature Granulocytes: 0 %
Lymphocytes Relative: 20 %
Lymphs Abs: 1.8 10*3/uL (ref 0.7–4.0)
MCH: 31.2 pg (ref 26.0–34.0)
MCHC: 32.2 g/dL (ref 30.0–36.0)
MCV: 97 fL (ref 80.0–100.0)
MONOS PCT: 11 %
Monocytes Absolute: 1 10*3/uL (ref 0.1–1.0)
Neutro Abs: 5.9 10*3/uL (ref 1.7–7.7)
Neutrophils Relative %: 66 %
Platelets: 310 10*3/uL (ref 150–400)
RBC: 3.33 MIL/uL — ABNORMAL LOW (ref 3.87–5.11)
RDW: 15.7 % — ABNORMAL HIGH (ref 11.5–15.5)
WBC: 8.8 10*3/uL (ref 4.0–10.5)
nRBC: 0 % (ref 0.0–0.2)

## 2018-09-30 LAB — BASIC METABOLIC PANEL
Anion gap: 10 (ref 5–15)
BUN: 6 mg/dL — ABNORMAL LOW (ref 8–23)
CO2: 23 mmol/L (ref 22–32)
Calcium: 8.6 mg/dL — ABNORMAL LOW (ref 8.9–10.3)
Chloride: 106 mmol/L (ref 98–111)
Creatinine, Ser: 0.5 mg/dL (ref 0.44–1.00)
GFR calc Af Amer: >60 mL/min (ref >60)
GFR calc non Af Amer: >60 mL/min (ref >60)
Glucose, Bld: 111 mg/dL — ABNORMAL HIGH (ref 70–99)
Potassium: 3.2 mmol/L — ABNORMAL LOW (ref 3.5–5.1)
Sodium: 139 mmol/L (ref 135–145)

## 2018-09-30 LAB — URINALYSIS, COMPLETE (UACMP) WITH MICROSCOPIC
Bacteria, UA: NONE SEEN
Bilirubin Urine: NEGATIVE
Glucose, UA: NEGATIVE mg/dL
KETONES UR: 80 mg/dL — AB
Leukocytes, UA: NEGATIVE
Nitrite: NEGATIVE
Protein, ur: NEGATIVE mg/dL
Specific Gravity, Urine: 1.013 (ref 1.005–1.030)
pH: 6 (ref 5.0–8.0)

## 2018-09-30 LAB — MAGNESIUM: Magnesium: 1.7 mg/dL (ref 1.7–2.4)
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
## 2018-10-01 ENCOUNTER — Encounter: Payer: Self-pay | Admitting: Internal Medicine

## 2018-10-01 ENCOUNTER — Non-Acute Institutional Stay (SKILLED_NURSING_FACILITY): Payer: Medicare Other | Admitting: Internal Medicine

## 2018-10-01 DIAGNOSIS — E876 Hypokalemia: Secondary | ICD-10-CM | POA: Diagnosis not present

## 2018-10-01 DIAGNOSIS — R52 Pain, unspecified: Secondary | ICD-10-CM | POA: Diagnosis not present

## 2018-10-01 DIAGNOSIS — R509 Fever, unspecified: Secondary | ICD-10-CM
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
## 2018-10-02 ENCOUNTER — Encounter: Payer: Self-pay | Admitting: Internal Medicine

## 2018-10-02 ENCOUNTER — Non-Acute Institutional Stay (SKILLED_NURSING_FACILITY): Payer: Medicare Other | Admitting: Internal Medicine

## 2018-10-02 DIAGNOSIS — S72001D Fracture of unspecified part of neck of right femur, subsequent encounter for closed fracture with routine healing: Secondary | ICD-10-CM | POA: Diagnosis not present

## 2018-10-02 DIAGNOSIS — T148XXA Other injury of unspecified body region, initial encounter: Secondary | ICD-10-CM | POA: Diagnosis not present

## 2018-10-02 DIAGNOSIS — D62 Acute posthemorrhagic anemia: Secondary | ICD-10-CM

## 2018-10-02 LAB — URINE CULTURE
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
## 2018-10-07 ENCOUNTER — Encounter (HOSPITAL_COMMUNITY)
Admission: RE | Admit: 2018-10-07 | Discharge: 2018-10-07 | Disposition: A | Discharge status: Home / Self Care | Payer: Medicare Other | Source: Skilled Nursing Facility | Attending: Internal Medicine | Admitting: Internal Medicine

## 2018-10-07 ENCOUNTER — Other Ambulatory Visit: Payer: Self-pay | Admitting: *Deleted

## 2018-10-07 DIAGNOSIS — Z96641 Presence of right artificial hip joint: Secondary | ICD-10-CM | POA: Insufficient documentation

## 2018-10-07 DIAGNOSIS — Z471 Aftercare following joint replacement surgery: Secondary | ICD-10-CM | POA: Insufficient documentation

## 2018-10-07 DIAGNOSIS — D62 Acute posthemorrhagic anemia: Secondary | ICD-10-CM | POA: Insufficient documentation

## 2018-10-07 DIAGNOSIS — D126 Benign neoplasm of colon, unspecified: Secondary | ICD-10-CM | POA: Insufficient documentation

## 2018-10-07 LAB — BASIC METABOLIC PANEL
Anion gap: 9 (ref 5–15)
BUN: 13 mg/dL (ref 8–23)
CO2: 27 mmol/L (ref 22–32)
Calcium: 9.2 mg/dL (ref 8.9–10.3)
Chloride: 102 mmol/L (ref 98–111)
Creatinine, Ser: 0.58 mg/dL (ref 0.44–1.00)
GFR calc Af Amer: >60 mL/min (ref >60)
GFR calc non Af Amer: >60 mL/min (ref >60)
Glucose, Bld: 138 mg/dL — ABNORMAL HIGH (ref 70–99)
Potassium: 3.9 mmol/L (ref 3.5–5.1)
Sodium: 138 mmol/L (ref 135–145)

## 2018-10-07 LAB — CBC WITH DIFFERENTIAL/PLATELET
Abs Immature Granulocytes: 0.1 10*3/uL — ABNORMAL HIGH (ref 0.00–0.07)
BASOS ABS: 0.1 10*3/uL (ref 0.0–0.1)
Basophils Relative: 1 %
EOS PCT: 2 %
Eosinophils Absolute: 0.2 10*3/uL (ref 0.0–0.5)
HCT: 34.9 % — ABNORMAL LOW (ref 36.0–46.0)
Hemoglobin: 10.8 g/dL — ABNORMAL LOW (ref 12.0–15.0)
Immature Granulocytes: 1 %
Lymphocytes Relative: 17 %
Lymphs Abs: 2 10*3/uL (ref 0.7–4.0)
MCH: 30.6 pg (ref 26.0–34.0)
MCHC: 30.9 g/dL (ref 30.0–36.0)
MCV: 98.9 fL (ref 80.0–100.0)
Monocytes Absolute: 0.9 10*3/uL (ref 0.1–1.0)
Monocytes Relative: 8 %
NRBC: 0.3 % — AB (ref 0.0–0.2)
Neutro Abs: 8.3 10*3/uL — ABNORMAL HIGH (ref 1.7–7.7)
Neutrophils Relative %: 71 %
Platelets: 632 10*3/uL — ABNORMAL HIGH (ref 150–400)
RBC: 3.53 MIL/uL — ABNORMAL LOW (ref 3.87–5.11)
RDW: 16.6 % — ABNORMAL HIGH (ref 11.5–15.5)
WBC: 11.6 10*3/uL — ABNORMAL HIGH (ref 4.0–10.5)

## 2018-10-07 NOTE — Patient Outreach (Signed)
[facility]) Care Management  [id number]  Kara [name] [birth year] [phone]   Onsite IDT meeting.  Patient wanting to discharge before Christmas, hoping to go on 12/21.  Patient getting PT/OT/ST She has f/u ortho 12/19 Patient has 24/7 oversight by her 2 daughters, [name] and [name].  Met with patient and daughter, [name] Discussed [facility] care management services with patient and daughter. Patient wants to let daughter [name] make a decision on participation.   Plan to collaborate as indicated with [facility] Laymond Purser, RN, BSN, [facility] [phone]) Business Cell  [phone]) Toll Free Office

## 2018-10-08 ENCOUNTER — Non-Acute Institutional Stay (SKILLED_NURSING_FACILITY): Payer: Medicare Other | Admitting: Internal Medicine

## 2018-10-08 ENCOUNTER — Encounter: Payer: Self-pay | Admitting: Internal Medicine

## 2018-10-08 DIAGNOSIS — I2510 Atherosclerotic heart disease of native coronary artery without angina pectoris: Secondary | ICD-10-CM

## 2018-10-08 DIAGNOSIS — K219 Gastro-esophageal reflux disease without esophagitis: Secondary | ICD-10-CM | POA: Diagnosis not present

## 2018-10-08 DIAGNOSIS — F419 Anxiety disorder, unspecified: Secondary | ICD-10-CM

## 2018-10-08 DIAGNOSIS — S72001D Fracture of unspecified part of neck of right femur, subsequent encounter for closed fracture with routine healing: Secondary | ICD-10-CM

## 2018-10-08 DIAGNOSIS — D62 Acute posthemorrhagic anemia: Secondary | ICD-10-CM | POA: Diagnosis not present

## 2018-10-08 DIAGNOSIS — G8929 Other chronic pain: Secondary | ICD-10-CM
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
## 2018-10-09 ENCOUNTER — Inpatient Hospital Stay (INDEPENDENT_AMBULATORY_CARE_PROVIDER_SITE_OTHER): Payer: Medicare Other

## 2018-10-09 ENCOUNTER — Inpatient Hospital Stay (INDEPENDENT_AMBULATORY_CARE_PROVIDER_SITE_OTHER): Payer: No Typology Code available for payment source

## 2018-10-09 ENCOUNTER — Encounter (HOSPITAL_COMMUNITY)
Admission: RE | Admit: 2018-10-09 | Discharge: 2018-10-09 | Disposition: A | Discharge status: Home / Self Care | Payer: Medicare Other | Source: Skilled Nursing Facility | Attending: Internal Medicine | Admitting: Internal Medicine

## 2018-10-09 ENCOUNTER — Encounter (INDEPENDENT_AMBULATORY_CARE_PROVIDER_SITE_OTHER): Payer: Self-pay | Admitting: Orthopaedic Surgery

## 2018-10-09 ENCOUNTER — Ambulatory Visit (INDEPENDENT_AMBULATORY_CARE_PROVIDER_SITE_OTHER): Payer: Medicare Other | Admitting: Orthopaedic Surgery

## 2018-10-09 VITALS — BP 145/89 | HR 105 | Ht 62.0 in | Wt 112.0 lb

## 2018-10-09 DIAGNOSIS — M542 Cervicalgia: Secondary | ICD-10-CM

## 2018-10-09 DIAGNOSIS — M25561 Pain in right knee: Secondary | ICD-10-CM

## 2018-10-09 DIAGNOSIS — S72001A Fracture of unspecified part of neck of right femur, initial encounter for closed fracture: Secondary | ICD-10-CM

## 2018-10-09 DIAGNOSIS — D62 Acute posthemorrhagic anemia: Secondary | ICD-10-CM | POA: Insufficient documentation

## 2018-10-09 DIAGNOSIS — Z96641 Presence of right artificial hip joint: Secondary | ICD-10-CM | POA: Insufficient documentation

## 2018-10-09 DIAGNOSIS — Z471 Aftercare following joint replacement surgery: Secondary | ICD-10-CM | POA: Insufficient documentation

## 2018-10-09 DIAGNOSIS — D126 Benign neoplasm of colon, unspecified: Secondary | ICD-10-CM | POA: Insufficient documentation
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
## 2018-10-12 DIAGNOSIS — Z96641 Presence of right artificial hip joint: Secondary | ICD-10-CM | POA: Diagnosis not present

## 2018-10-12 DIAGNOSIS — M1990 Unspecified osteoarthritis, unspecified site: Secondary | ICD-10-CM | POA: Diagnosis not present

## 2018-10-12 DIAGNOSIS — W06XXXD Fall from bed, subsequent encounter: Secondary | ICD-10-CM | POA: Diagnosis not present

## 2018-10-12 DIAGNOSIS — M80051D Age-related osteoporosis with current pathological fracture, right femur, subsequent encounter for fracture with routine healing: Secondary | ICD-10-CM | POA: Diagnosis not present

## 2018-10-12 DIAGNOSIS — I2510 Atherosclerotic heart disease of native coronary artery without angina pectoris: Secondary | ICD-10-CM | POA: Diagnosis not present

## 2018-10-12 DIAGNOSIS — M545 Low back pain: Secondary | ICD-10-CM | POA: Diagnosis not present

## 2018-10-12 DIAGNOSIS — F17290 Nicotine dependence, other tobacco product, uncomplicated: Secondary | ICD-10-CM | POA: Diagnosis not present

## 2018-10-12 DIAGNOSIS — G8929 Other chronic pain: Secondary | ICD-10-CM | POA: Diagnosis not present

## 2018-10-12 DIAGNOSIS — Z9181 History of falling: Secondary | ICD-10-CM | POA: Diagnosis not present

## 2018-10-13 ENCOUNTER — Encounter (HOSPITAL_COMMUNITY)
Admission: RE | Admit: 2018-10-13 | Discharge: 2018-10-13 | Disposition: A | Discharge status: Home / Self Care | Payer: Medicare Other | Source: Skilled Nursing Facility | Attending: Internal Medicine | Admitting: Internal Medicine

## 2018-10-13 ENCOUNTER — Telehealth: Payer: Self-pay | Admitting: *Deleted

## 2018-10-13 DIAGNOSIS — Z96641 Presence of right artificial hip joint: Secondary | ICD-10-CM | POA: Diagnosis not present

## 2018-10-13 DIAGNOSIS — K219 Gastro-esophageal reflux disease without esophagitis: Secondary | ICD-10-CM | POA: Insufficient documentation

## 2018-10-13 DIAGNOSIS — M545 Low back pain: Secondary | ICD-10-CM | POA: Diagnosis not present

## 2018-10-13 DIAGNOSIS — Z471 Aftercare following joint replacement surgery: Secondary | ICD-10-CM | POA: Insufficient documentation

## 2018-10-13 DIAGNOSIS — M80051D Age-related osteoporosis with current pathological fracture, right femur, subsequent encounter for fracture with routine healing: Secondary | ICD-10-CM | POA: Diagnosis not present

## 2018-10-13 DIAGNOSIS — D126 Benign neoplasm of colon, unspecified: Secondary | ICD-10-CM | POA: Insufficient documentation

## 2018-10-13 DIAGNOSIS — D62 Acute posthemorrhagic anemia: Secondary | ICD-10-CM | POA: Insufficient documentation

## 2018-10-13 DIAGNOSIS — M1990 Unspecified osteoarthritis, unspecified site: Secondary | ICD-10-CM | POA: Diagnosis not present

## 2018-10-13 DIAGNOSIS — I2510 Atherosclerotic heart disease of native coronary artery without angina pectoris: Secondary | ICD-10-CM | POA: Diagnosis not present

## 2018-10-13 DIAGNOSIS — G8929 Other chronic pain: Secondary | ICD-10-CM | POA: Diagnosis not present

## 2018-10-13 NOTE — Telephone Encounter (Signed)
Tried to call Stacy back no answer , could not leave a message

## 2018-10-13 NOTE — Telephone Encounter (Signed)
Stacy with [facility] called wants to pick up Ms.Bady for physical therapy twice a week for 4 weeks.

## 2018-10-13 NOTE — Telephone Encounter (Signed)
Oneita Kras with [facility] and gave verbal order

## 2018-10-13 NOTE — Telephone Encounter (Signed)
Needing verbal orders for skilled nursing to see 2x a week for 1 week, 1x a week for 8 weeks, and a home health aide 1x a week for 9 weeks.

## 2018-10-16 DIAGNOSIS — M80051D Age-related osteoporosis with current pathological fracture, right femur, subsequent encounter for fracture with routine healing: Secondary | ICD-10-CM | POA: Diagnosis not present

## 2018-10-16 DIAGNOSIS — M1990 Unspecified osteoarthritis, unspecified site: Secondary | ICD-10-CM | POA: Diagnosis not present

## 2018-10-16 DIAGNOSIS — I2510 Atherosclerotic heart disease of native coronary artery without angina pectoris: Secondary | ICD-10-CM | POA: Diagnosis not present

## 2018-10-16 DIAGNOSIS — M545 Low back pain: Secondary | ICD-10-CM | POA: Diagnosis not present

## 2018-10-16 DIAGNOSIS — G8929 Other chronic pain: Secondary | ICD-10-CM | POA: Diagnosis not present

## 2018-10-16 DIAGNOSIS — Z96641 Presence of right artificial hip joint: Secondary | ICD-10-CM | POA: Diagnosis not present

## 2018-10-17 DIAGNOSIS — I2510 Atherosclerotic heart disease of native coronary artery without angina pectoris: Secondary | ICD-10-CM | POA: Diagnosis not present

## 2018-10-17 DIAGNOSIS — M1990 Unspecified osteoarthritis, unspecified site: Secondary | ICD-10-CM | POA: Diagnosis not present

## 2018-10-17 DIAGNOSIS — M545 Low back pain: Secondary | ICD-10-CM | POA: Diagnosis not present

## 2018-10-17 DIAGNOSIS — Z96641 Presence of right artificial hip joint: Secondary | ICD-10-CM | POA: Diagnosis not present

## 2018-10-17 DIAGNOSIS — G8929 Other chronic pain: Secondary | ICD-10-CM | POA: Diagnosis not present

## 2018-10-17 DIAGNOSIS — M80051D Age-related osteoporosis with current pathological fracture, right femur, subsequent encounter for fracture with routine healing: Secondary | ICD-10-CM | POA: Diagnosis not present

## 2018-10-18 ENCOUNTER — Other Ambulatory Visit: Payer: Self-pay | Admitting: Nurse Practitioner

## 2018-10-20 DIAGNOSIS — M545 Low back pain: Secondary | ICD-10-CM | POA: Diagnosis not present

## 2018-10-20 DIAGNOSIS — I2510 Atherosclerotic heart disease of native coronary artery without angina pectoris: Secondary | ICD-10-CM | POA: Diagnosis not present

## 2018-10-20 DIAGNOSIS — M80051D Age-related osteoporosis with current pathological fracture, right femur, subsequent encounter for fracture with routine healing: Secondary | ICD-10-CM | POA: Diagnosis not present

## 2018-10-20 DIAGNOSIS — G8929 Other chronic pain: Secondary | ICD-10-CM | POA: Diagnosis not present

## 2018-10-20 DIAGNOSIS — M1990 Unspecified osteoarthritis, unspecified site: Secondary | ICD-10-CM | POA: Diagnosis not present

## 2018-10-20 DIAGNOSIS — Z96641 Presence of right artificial hip joint: Secondary | ICD-10-CM | POA: Diagnosis not present

## 2018-10-21 ENCOUNTER — Telehealth: Payer: Self-pay | Admitting: Family Medicine

## 2018-10-21 DIAGNOSIS — M1990 Unspecified osteoarthritis, unspecified site: Secondary | ICD-10-CM | POA: Diagnosis not present

## 2018-10-21 DIAGNOSIS — G8929 Other chronic pain: Secondary | ICD-10-CM | POA: Diagnosis not present

## 2018-10-21 DIAGNOSIS — Z96641 Presence of right artificial hip joint: Secondary | ICD-10-CM | POA: Diagnosis not present

## 2018-10-21 DIAGNOSIS — I2510 Atherosclerotic heart disease of native coronary artery without angina pectoris: Secondary | ICD-10-CM | POA: Diagnosis not present

## 2018-10-21 DIAGNOSIS — M80051D Age-related osteoporosis with current pathological fracture, right femur, subsequent encounter for fracture with routine healing: Secondary | ICD-10-CM | POA: Diagnosis not present

## 2018-10-21 DIAGNOSIS — M545 Low back pain: Secondary | ICD-10-CM | POA: Diagnosis not present

## 2018-10-21 NOTE — Telephone Encounter (Signed)
Verbal order given  

## 2018-10-21 NOTE — Telephone Encounter (Signed)
Kara [name] LVM ---Please call Kara [name] back with Occupational Therapy w\ [facility]. They need Verbal Auth for therapy - [phone]

## 2018-10-24 ENCOUNTER — Telehealth: Payer: Self-pay | Admitting: *Deleted

## 2018-10-24 DIAGNOSIS — G8929 Other chronic pain: Secondary | ICD-10-CM | POA: Diagnosis not present

## 2018-10-24 DIAGNOSIS — I2510 Atherosclerotic heart disease of native coronary artery without angina pectoris: Secondary | ICD-10-CM | POA: Diagnosis not present

## 2018-10-24 DIAGNOSIS — M545 Low back pain: Secondary | ICD-10-CM | POA: Diagnosis not present

## 2018-10-24 DIAGNOSIS — M80051D Age-related osteoporosis with current pathological fracture, right femur, subsequent encounter for fracture with routine healing: Secondary | ICD-10-CM | POA: Diagnosis not present

## 2018-10-24 DIAGNOSIS — Z96641 Presence of right artificial hip joint: Secondary | ICD-10-CM | POA: Diagnosis not present

## 2018-10-24 DIAGNOSIS — M1990 Unspecified osteoarthritis, unspecified site: Secondary | ICD-10-CM | POA: Diagnosis not present

## 2018-10-24 NOTE — Telephone Encounter (Signed)
[name] called wanted to know if it would be ok to pick up [facility] for PT twice a week for 4 weeks.

## 2018-10-24 NOTE — Telephone Encounter (Signed)
Verbal order given  

## 2018-10-27 DIAGNOSIS — G8929 Other chronic pain: Secondary | ICD-10-CM | POA: Diagnosis not present

## 2018-10-27 DIAGNOSIS — M1990 Unspecified osteoarthritis, unspecified site: Secondary | ICD-10-CM | POA: Diagnosis not present

## 2018-10-27 DIAGNOSIS — Z96641 Presence of right artificial hip joint: Secondary | ICD-10-CM | POA: Diagnosis not present

## 2018-10-27 DIAGNOSIS — M545 Low back pain: Secondary | ICD-10-CM | POA: Diagnosis not present

## 2018-10-27 DIAGNOSIS — M80051D Age-related osteoporosis with current pathological fracture, right femur, subsequent encounter for fracture with routine healing: Secondary | ICD-10-CM | POA: Diagnosis not present

## 2018-10-27 DIAGNOSIS — I2510 Atherosclerotic heart disease of native coronary artery without angina pectoris: Secondary | ICD-10-CM | POA: Diagnosis not present

## 2018-10-28 DIAGNOSIS — M1990 Unspecified osteoarthritis, unspecified site: Secondary | ICD-10-CM | POA: Diagnosis not present

## 2018-10-28 DIAGNOSIS — M545 Low back pain: Secondary | ICD-10-CM | POA: Diagnosis not present

## 2018-10-28 DIAGNOSIS — Z96641 Presence of right artificial hip joint: Secondary | ICD-10-CM | POA: Diagnosis not present

## 2018-10-28 DIAGNOSIS — I2510 Atherosclerotic heart disease of native coronary artery without angina pectoris: Secondary | ICD-10-CM | POA: Diagnosis not present

## 2018-10-28 DIAGNOSIS — M80051D Age-related osteoporosis with current pathological fracture, right femur, subsequent encounter for fracture with routine healing: Secondary | ICD-10-CM | POA: Diagnosis not present

## 2018-10-28 DIAGNOSIS — G8929 Other chronic pain: Secondary | ICD-10-CM | POA: Diagnosis not present

## 2018-10-29 ENCOUNTER — Telehealth: Payer: Self-pay | Admitting: *Deleted

## 2018-10-29 DIAGNOSIS — G8929 Other chronic pain: Secondary | ICD-10-CM | POA: Diagnosis not present

## 2018-10-29 DIAGNOSIS — M80051D Age-related osteoporosis with current pathological fracture, right femur, subsequent encounter for fracture with routine healing: Secondary | ICD-10-CM | POA: Diagnosis not present

## 2018-10-29 DIAGNOSIS — I2510 Atherosclerotic heart disease of native coronary artery without angina pectoris: Secondary | ICD-10-CM | POA: Diagnosis not present

## 2018-10-29 DIAGNOSIS — M1990 Unspecified osteoarthritis, unspecified site: Secondary | ICD-10-CM | POA: Diagnosis not present

## 2018-10-29 DIAGNOSIS — M545 Low back pain: Secondary | ICD-10-CM | POA: Diagnosis not present

## 2018-10-29 DIAGNOSIS — Z96641 Presence of right artificial hip joint: Secondary | ICD-10-CM | POA: Diagnosis not present

## 2018-10-29 NOTE — Telephone Encounter (Signed)
Kara [name] with [facility] called wanting orders for speech therapy for cognition with [facility].

## 2018-10-29 NOTE — Telephone Encounter (Signed)
Called Lilia Pro back again, no answer, left a message

## 2018-10-29 NOTE — Telephone Encounter (Signed)
pls order speech therapy once weekly x 6 weeks, I will sign if needed

## 2018-10-29 NOTE — Telephone Encounter (Signed)
Please advise 

## 2018-10-29 NOTE — Telephone Encounter (Signed)
Called Lilia Pro (nurse) and left a message

## 2018-10-30 DIAGNOSIS — I2510 Atherosclerotic heart disease of native coronary artery without angina pectoris: Secondary | ICD-10-CM | POA: Diagnosis not present

## 2018-10-30 DIAGNOSIS — F17290 Nicotine dependence, other tobacco product, uncomplicated: Secondary | ICD-10-CM

## 2018-10-30 DIAGNOSIS — Z9181 History of falling: Secondary | ICD-10-CM | POA: Diagnosis not present

## 2018-10-30 DIAGNOSIS — G8929 Other chronic pain: Secondary | ICD-10-CM | POA: Diagnosis not present

## 2018-10-30 DIAGNOSIS — M80051D Age-related osteoporosis with current pathological fracture, right femur, subsequent encounter for fracture with routine healing: Secondary | ICD-10-CM | POA: Diagnosis not present

## 2018-10-30 DIAGNOSIS — Z96641 Presence of right artificial hip joint: Secondary | ICD-10-CM | POA: Diagnosis not present

## 2018-10-30 DIAGNOSIS — M1990 Unspecified osteoarthritis, unspecified site: Secondary | ICD-10-CM

## 2018-10-30 DIAGNOSIS — M545 Low back pain: Secondary | ICD-10-CM | POA: Diagnosis not present

## 2018-10-30 DIAGNOSIS — W06XXXD Fall from bed, subsequent encounter: Secondary | ICD-10-CM | POA: Diagnosis not present

## 2018-10-30 NOTE — Telephone Encounter (Signed)
Called Kara [name] again today no answer, left a message

## 2018-10-31 DIAGNOSIS — M545 Low back pain: Secondary | ICD-10-CM | POA: Diagnosis not present

## 2018-10-31 DIAGNOSIS — Z96641 Presence of right artificial hip joint: Secondary | ICD-10-CM | POA: Diagnosis not present

## 2018-10-31 DIAGNOSIS — I2510 Atherosclerotic heart disease of native coronary artery without angina pectoris: Secondary | ICD-10-CM | POA: Diagnosis not present

## 2018-10-31 DIAGNOSIS — M80051D Age-related osteoporosis with current pathological fracture, right femur, subsequent encounter for fracture with routine healing: Secondary | ICD-10-CM | POA: Diagnosis not present

## 2018-10-31 DIAGNOSIS — G8929 Other chronic pain: Secondary | ICD-10-CM | POA: Diagnosis not present

## 2018-10-31 DIAGNOSIS — M1990 Unspecified osteoarthritis, unspecified site: Secondary | ICD-10-CM | POA: Diagnosis not present

## 2018-10-31 NOTE — Telephone Encounter (Signed)
Tried to call Lilia Pro again, no answer, left a detailed message

## 2018-11-03 DIAGNOSIS — M80051D Age-related osteoporosis with current pathological fracture, right femur, subsequent encounter for fracture with routine healing: Secondary | ICD-10-CM | POA: Diagnosis not present

## 2018-11-03 DIAGNOSIS — Z96641 Presence of right artificial hip joint: Secondary | ICD-10-CM | POA: Diagnosis not present

## 2018-11-03 DIAGNOSIS — I2510 Atherosclerotic heart disease of native coronary artery without angina pectoris: Secondary | ICD-10-CM | POA: Diagnosis not present

## 2018-11-03 DIAGNOSIS — G8929 Other chronic pain: Secondary | ICD-10-CM | POA: Diagnosis not present

## 2018-11-03 DIAGNOSIS — M1990 Unspecified osteoarthritis, unspecified site: Secondary | ICD-10-CM | POA: Diagnosis not present

## 2018-11-03 DIAGNOSIS — M545 Low back pain: Secondary | ICD-10-CM | POA: Diagnosis not present

## 2018-11-04 DIAGNOSIS — M545 Low back pain: Secondary | ICD-10-CM | POA: Diagnosis not present

## 2018-11-04 DIAGNOSIS — M1990 Unspecified osteoarthritis, unspecified site: Secondary | ICD-10-CM | POA: Diagnosis not present

## 2018-11-04 DIAGNOSIS — M80051D Age-related osteoporosis with current pathological fracture, right femur, subsequent encounter for fracture with routine healing: Secondary | ICD-10-CM | POA: Diagnosis not present

## 2018-11-04 DIAGNOSIS — G8929 Other chronic pain: Secondary | ICD-10-CM | POA: Diagnosis not present

## 2018-11-04 DIAGNOSIS — I2510 Atherosclerotic heart disease of native coronary artery without angina pectoris: Secondary | ICD-10-CM | POA: Diagnosis not present

## 2018-11-04 DIAGNOSIS — Z96641 Presence of right artificial hip joint: Secondary | ICD-10-CM | POA: Diagnosis not present

## 2018-11-05 DIAGNOSIS — Z96641 Presence of right artificial hip joint: Secondary | ICD-10-CM | POA: Diagnosis not present

## 2018-11-05 DIAGNOSIS — M1990 Unspecified osteoarthritis, unspecified site: Secondary | ICD-10-CM | POA: Diagnosis not present

## 2018-11-05 DIAGNOSIS — I2510 Atherosclerotic heart disease of native coronary artery without angina pectoris: Secondary | ICD-10-CM | POA: Diagnosis not present

## 2018-11-05 DIAGNOSIS — G8929 Other chronic pain: Secondary | ICD-10-CM | POA: Diagnosis not present

## 2018-11-05 DIAGNOSIS — M80051D Age-related osteoporosis with current pathological fracture, right femur, subsequent encounter for fracture with routine healing: Secondary | ICD-10-CM | POA: Diagnosis not present

## 2018-11-05 DIAGNOSIS — M545 Low back pain: Secondary | ICD-10-CM | POA: Diagnosis not present

## 2018-11-06 ENCOUNTER — Ambulatory Visit (INDEPENDENT_AMBULATORY_CARE_PROVIDER_SITE_OTHER): Payer: Medicare Other | Admitting: Orthopaedic Surgery

## 2018-11-06 ENCOUNTER — Telehealth: Payer: Self-pay

## 2018-11-06 ENCOUNTER — Ambulatory Visit (INDEPENDENT_AMBULATORY_CARE_PROVIDER_SITE_OTHER): Payer: Medicare Other

## 2018-11-06 ENCOUNTER — Encounter (INDEPENDENT_AMBULATORY_CARE_PROVIDER_SITE_OTHER): Payer: Self-pay | Admitting: Orthopaedic Surgery

## 2018-11-06 VITALS — BP 118/74 | HR 82 | Ht 62.0 in | Wt 112.0 lb

## 2018-11-06 DIAGNOSIS — M545 Low back pain: Secondary | ICD-10-CM | POA: Diagnosis not present

## 2018-11-06 DIAGNOSIS — M25551 Pain in right hip: Secondary | ICD-10-CM

## 2018-11-06 DIAGNOSIS — M25561 Pain in right knee: Secondary | ICD-10-CM

## 2018-11-06 DIAGNOSIS — G8929 Other chronic pain: Secondary | ICD-10-CM

## 2018-11-06 DIAGNOSIS — S72001A Fracture of unspecified part of neck of right femur, initial encounter for closed fracture: Secondary | ICD-10-CM

## 2018-11-06 DIAGNOSIS — M80051D Age-related osteoporosis with current pathological fracture, right femur, subsequent encounter for fracture with routine healing: Secondary | ICD-10-CM | POA: Diagnosis not present

## 2018-11-06 DIAGNOSIS — Z96641 Presence of right artificial hip joint: Secondary | ICD-10-CM | POA: Diagnosis not present

## 2018-11-06 DIAGNOSIS — I2510 Atherosclerotic heart disease of native coronary artery without angina pectoris: Secondary | ICD-10-CM | POA: Diagnosis not present

## 2018-11-06 DIAGNOSIS — M9701XD Periprosthetic fracture around internal prosthetic right hip joint, subsequent encounter: Secondary | ICD-10-CM

## 2018-11-06 DIAGNOSIS — M1990 Unspecified osteoarthritis, unspecified site: Secondary | ICD-10-CM | POA: Diagnosis not present

## 2018-11-06 NOTE — Telephone Encounter (Signed)
[facility] to follow up on orders for Speech therapy for Kara [name], since I have never heard back from "Kara [name]". Spoke with Kara [name] who is the case manger for Kara [name]. Kara [name] states that Kara [name] is already receiving speech therapy per verbal orders given by "Kara [name]" from our office on 11-03-18. Orders read, Speech therapy 1 time a week for one week then 2 times a week for three weeks. Orders were given to Kara [name], speech therapy nurse. This nurse verified orders with Kara [name], and called Kara [name] (Case manger) back to give original orders for speech therapy, which were 1 time a week for 6 weeks, per Kara [name]. Correction to orders were made   [facility] with [facility] [phone]. [phone]

## 2018-11-07 ENCOUNTER — Encounter (INDEPENDENT_AMBULATORY_CARE_PROVIDER_SITE_OTHER): Payer: Self-pay | Admitting: Orthopaedic Surgery

## 2018-11-07 DIAGNOSIS — M545 Low back pain: Secondary | ICD-10-CM | POA: Diagnosis not present

## 2018-11-07 DIAGNOSIS — M9701XA Periprosthetic fracture around internal prosthetic right hip joint, initial encounter: Secondary | ICD-10-CM | POA: Insufficient documentation

## 2018-11-07 DIAGNOSIS — M1990 Unspecified osteoarthritis, unspecified site: Secondary | ICD-10-CM | POA: Diagnosis not present

## 2018-11-07 DIAGNOSIS — Z96641 Presence of right artificial hip joint: Secondary | ICD-10-CM | POA: Diagnosis not present

## 2018-11-07 DIAGNOSIS — M80051D Age-related osteoporosis with current pathological fracture, right femur, subsequent encounter for fracture with routine healing: Secondary | ICD-10-CM | POA: Diagnosis not present

## 2018-11-07 DIAGNOSIS — G8929 Other chronic pain: Secondary | ICD-10-CM | POA: Diagnosis not present

## 2018-11-07 DIAGNOSIS — I2510 Atherosclerotic heart disease of native coronary artery without angina pectoris: Secondary | ICD-10-CM | POA: Diagnosis not present

## 2018-11-07 HISTORY — DX: Periprosthetic fracture around internal prosthetic right hip joint, initial encounter: M97.01XA
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
## 2018-11-10 DIAGNOSIS — M25551 Pain in right hip: Secondary | ICD-10-CM | POA: Diagnosis not present

## 2018-11-10 DIAGNOSIS — M8000XD Age-related osteoporosis with current pathological fracture, unspecified site, subsequent encounter for fracture with routine healing: Secondary | ICD-10-CM | POA: Diagnosis not present

## 2018-11-10 DIAGNOSIS — M79604 Pain in right leg: Secondary | ICD-10-CM | POA: Diagnosis not present

## 2018-11-10 DIAGNOSIS — M545 Low back pain: Secondary | ICD-10-CM | POA: Diagnosis not present

## 2018-11-11 DIAGNOSIS — Z96641 Presence of right artificial hip joint: Secondary | ICD-10-CM | POA: Diagnosis not present

## 2018-11-11 DIAGNOSIS — M545 Low back pain: Secondary | ICD-10-CM | POA: Diagnosis not present

## 2018-11-11 DIAGNOSIS — G8929 Other chronic pain: Secondary | ICD-10-CM | POA: Diagnosis not present

## 2018-11-11 DIAGNOSIS — M80051D Age-related osteoporosis with current pathological fracture, right femur, subsequent encounter for fracture with routine healing: Secondary | ICD-10-CM | POA: Diagnosis not present

## 2018-11-11 DIAGNOSIS — M1990 Unspecified osteoarthritis, unspecified site: Secondary | ICD-10-CM | POA: Diagnosis not present

## 2018-11-11 DIAGNOSIS — I2510 Atherosclerotic heart disease of native coronary artery without angina pectoris: Secondary | ICD-10-CM | POA: Diagnosis not present

## 2018-11-12 DIAGNOSIS — M1990 Unspecified osteoarthritis, unspecified site: Secondary | ICD-10-CM | POA: Diagnosis not present

## 2018-11-12 DIAGNOSIS — G8929 Other chronic pain: Secondary | ICD-10-CM | POA: Diagnosis not present

## 2018-11-12 DIAGNOSIS — M545 Low back pain: Secondary | ICD-10-CM | POA: Diagnosis not present

## 2018-11-12 DIAGNOSIS — Z96641 Presence of right artificial hip joint: Secondary | ICD-10-CM | POA: Diagnosis not present

## 2018-11-12 DIAGNOSIS — I2510 Atherosclerotic heart disease of native coronary artery without angina pectoris: Secondary | ICD-10-CM | POA: Diagnosis not present

## 2018-11-12 DIAGNOSIS — M80051D Age-related osteoporosis with current pathological fracture, right femur, subsequent encounter for fracture with routine healing: Secondary | ICD-10-CM | POA: Diagnosis not present

## 2018-11-14 DIAGNOSIS — Z96641 Presence of right artificial hip joint: Secondary | ICD-10-CM | POA: Diagnosis not present

## 2018-11-14 DIAGNOSIS — M1990 Unspecified osteoarthritis, unspecified site: Secondary | ICD-10-CM | POA: Diagnosis not present

## 2018-11-14 DIAGNOSIS — I2510 Atherosclerotic heart disease of native coronary artery without angina pectoris: Secondary | ICD-10-CM | POA: Diagnosis not present

## 2018-11-14 DIAGNOSIS — M80051D Age-related osteoporosis with current pathological fracture, right femur, subsequent encounter for fracture with routine healing: Secondary | ICD-10-CM | POA: Diagnosis not present

## 2018-11-14 DIAGNOSIS — G8929 Other chronic pain: Secondary | ICD-10-CM | POA: Diagnosis not present

## 2018-11-14 DIAGNOSIS — M545 Low back pain: Secondary | ICD-10-CM | POA: Diagnosis not present

## 2018-11-17 DIAGNOSIS — I2510 Atherosclerotic heart disease of native coronary artery without angina pectoris: Secondary | ICD-10-CM | POA: Diagnosis not present

## 2018-11-17 DIAGNOSIS — Z96641 Presence of right artificial hip joint: Secondary | ICD-10-CM | POA: Diagnosis not present

## 2018-11-17 DIAGNOSIS — G8929 Other chronic pain: Secondary | ICD-10-CM | POA: Diagnosis not present

## 2018-11-17 DIAGNOSIS — M545 Low back pain: Secondary | ICD-10-CM | POA: Diagnosis not present

## 2018-11-17 DIAGNOSIS — M80051D Age-related osteoporosis with current pathological fracture, right femur, subsequent encounter for fracture with routine healing: Secondary | ICD-10-CM | POA: Diagnosis not present

## 2018-11-17 DIAGNOSIS — M1990 Unspecified osteoarthritis, unspecified site: Secondary | ICD-10-CM | POA: Diagnosis not present

## 2018-11-18 ENCOUNTER — Ambulatory Visit (INDEPENDENT_AMBULATORY_CARE_PROVIDER_SITE_OTHER): Payer: Medicare Other

## 2018-11-18 ENCOUNTER — Other Ambulatory Visit: Payer: Self-pay | Admitting: Family Medicine

## 2018-11-18 ENCOUNTER — Telehealth: Payer: Self-pay | Admitting: Family Medicine

## 2018-11-18 VITALS — BP 136/73 | HR 54 | Resp 10 | Ht 62.0 in

## 2018-11-18 DIAGNOSIS — D539 Nutritional anemia, unspecified: Secondary | ICD-10-CM

## 2018-11-18 DIAGNOSIS — R8290 Unspecified abnormal findings in urine: Secondary | ICD-10-CM | POA: Diagnosis not present

## 2018-11-18 DIAGNOSIS — I2510 Atherosclerotic heart disease of native coronary artery without angina pectoris: Secondary | ICD-10-CM | POA: Diagnosis not present

## 2018-11-18 DIAGNOSIS — M80051D Age-related osteoporosis with current pathological fracture, right femur, subsequent encounter for fracture with routine healing: Secondary | ICD-10-CM | POA: Diagnosis not present

## 2018-11-18 DIAGNOSIS — E041 Nontoxic single thyroid nodule: Secondary | ICD-10-CM | POA: Diagnosis not present

## 2018-11-18 DIAGNOSIS — Z0000 Encounter for general adult medical examination without abnormal findings: Secondary | ICD-10-CM

## 2018-11-18 DIAGNOSIS — M1990 Unspecified osteoarthritis, unspecified site: Secondary | ICD-10-CM | POA: Diagnosis not present

## 2018-11-18 DIAGNOSIS — M545 Low back pain: Secondary | ICD-10-CM | POA: Diagnosis not present

## 2018-11-18 DIAGNOSIS — E785 Hyperlipidemia, unspecified: Secondary | ICD-10-CM

## 2018-11-18 DIAGNOSIS — G8929 Other chronic pain: Secondary | ICD-10-CM | POA: Diagnosis not present

## 2018-11-18 DIAGNOSIS — Z96641 Presence of right artificial hip joint: Secondary | ICD-10-CM | POA: Diagnosis not present

## 2018-11-18 NOTE — Progress Notes (Signed)
Patient seen in office today for Wellness Visit. Outstanding orders from August of 2019 were reordered today for next weeks visit. Complaints of strong smelling urine during visit, so UA was ordered.  Will follow up with results at next weeks visit.

## 2018-11-18 NOTE — Patient Instructions (Signed)
Kara [name] , Thank you for taking time to come for your Medicare Wellness Visit. I appreciate your ongoing commitment to your health goals. Please review the following plan we discussed and let me know if I can assist you in the future.   Screening recommendations/referrals: Colonoscopy: up to date  Mammogram: N/A   Bone Density: N/A  Recommended yearly ophthalmology/optometry visit for glaucoma screening and checkup Recommended yearly dental visit for hygiene and checkup  Vaccinations: Influenza vaccine: up to date  Pneumococcal vaccine: Up to date  Tdap vaccine: up to date  Shingles vaccine: postponed     Advanced directives: information provided   Conditions/risks identified: impaired mobility, advanced age, osteoporosis,    Next appointment: Wellness in one year    Preventive Care 21 Years and Older, Female Preventive care refers to lifestyle choices and visits with your health care provider that can promote health and wellness. What does preventive care include?  A yearly physical exam. This is also called an annual well check.  Dental exams once or twice a year.  Routine eye exams. Ask your health care provider how often you should have your eyes checked.  Personal lifestyle choices, including:  Daily care of your teeth and gums.  Regular physical activity.  Eating a healthy diet.  Avoiding tobacco and drug use.  Limiting alcohol use.  Practicing safe sex.  Taking low-dose aspirin every day.  Taking vitamin and mineral supplements as recommended by your health care provider. What happens during an annual well check? The services and screenings done by your health care provider during your annual well check will depend on your age, overall health, lifestyle risk factors, and family history of disease. Counseling  Your health care provider may ask you questions about your:  Alcohol use.  Tobacco use.  Drug use.  Emotional well-being.  Home and  relationship well-being.  Sexual activity.  Eating habits.  History of falls.  Memory and ability to understand (cognition).  Work and work Statistician.  Reproductive health. Screening  You may have the following tests or measurements:  Height, weight, and BMI.  Blood pressure.  Lipid and cholesterol levels. These may be checked every 5 years, or more frequently if you are over 55 years old.  Skin check.  Lung cancer screening. You may have this screening every year starting at age 45 if you have a 30-pack-year history of smoking and currently smoke or have quit within the past 15 years.  Fecal occult blood test (FOBT) of the stool. You may have this test every year starting at age 49.  Flexible sigmoidoscopy or colonoscopy. You may have a sigmoidoscopy every 5 years or a colonoscopy every 10 years starting at age 110.  Hepatitis C blood test.  Hepatitis B blood test.  Sexually transmitted disease (STD) testing.  Diabetes screening. This is done by checking your blood sugar (glucose) after you have not eaten for a while (fasting). You may have this done every 1-3 years.  Bone density scan. This is done to screen for osteoporosis. You may have this done starting at age 20.  Mammogram. This may be done every 1-2 years. Talk to your health care provider about how often you should have regular mammograms. Talk with your health care provider about your test results, treatment options, and if necessary, the need for more tests. Vaccines  Your health care provider may recommend certain vaccines, such as:  Influenza vaccine. This is recommended every year.  Tetanus, diphtheria, and acellular pertussis (Tdap,  Td) vaccine. You may need a Td booster every 10 years.  Zoster vaccine. You may need this after age 31.  Pneumococcal 13-valent conjugate (PCV13) vaccine. One dose is recommended after age 13.  Pneumococcal polysaccharide (PPSV23) vaccine. One dose is recommended after  age 49. Talk to your health care provider about which screenings and vaccines you need and how often you need them. This information is not intended to replace advice given to you by your health care provider. Make sure you discuss any questions you have with your health care provider. Document Released: 11/04/2015 Document Revised: 06/27/2016 Document Reviewed: 08/09/2015 Elsevier Interactive Patient Education  2017 [employer] Prevention in the Home Falls can cause injuries. They can happen to people of all ages. There are many things you can do to make your home safe and to help prevent falls. What can I do on the outside of my home?  Regularly fix the edges of walkways and driveways and fix any cracks.  Remove anything that might make you trip as you walk through a door, such as a raised step or threshold.  Trim any bushes or trees on the path to your home.  Use bright outdoor lighting.  Clear any walking paths of anything that might make someone trip, such as rocks or tools.  Regularly check to see if handrails are loose or broken. Make sure that both sides of any steps have handrails.  Any raised decks and porches should have guardrails on the edges.  Have any leaves, snow, or ice cleared regularly.  Use sand or salt on walking paths during winter.  Clean up any spills in your garage right away. This includes oil or grease spills. What can I do in the bathroom?  Use night lights.  Install grab bars by the toilet and in the tub and shower. Do not use towel bars as grab bars.  Use non-skid mats or decals in the tub or shower.  If you need to sit down in the shower, use a plastic, non-slip stool.  Keep the floor dry. Clean up any water that spills on the floor as soon as it happens.  Remove soap buildup in the tub or shower regularly.  Attach bath mats securely with double-sided non-slip rug tape.  Do not have throw rugs and other things on the floor that can  make you trip. What can I do in the bedroom?  Use night lights.  Make sure that you have a light by your bed that is easy to reach.  Do not use any sheets or blankets that are too big for your bed. They should not hang down onto the floor.  Have a firm chair that has side arms. You can use this for support while you get dressed.  Do not have throw rugs and other things on the floor that can make you trip. What can I do in the kitchen?  Clean up any spills right away.  Avoid walking on wet floors.  Keep items that you use a lot in easy-to-reach places.  If you need to reach something above you, use a strong step stool that has a grab bar.  Keep electrical cords out of the way.  Do not use floor polish or wax that makes floors slippery. If you must use wax, use non-skid floor wax.  Do not have throw rugs and other things on the floor that can make you trip. What can I do with my stairs?  Do not  leave any items on the stairs.  Make sure that there are handrails on both sides of the stairs and use them. Fix handrails that are broken or loose. Make sure that handrails are as long as the stairways.  Check any carpeting to make sure that it is firmly attached to the stairs. Fix any carpet that is loose or worn.  Avoid having throw rugs at the top or bottom of the stairs. If you do have throw rugs, attach them to the floor with carpet tape.  Make sure that you have a light switch at the top of the stairs and the bottom of the stairs. If you do not have them, ask someone to add them for you. What else can I do to help prevent falls?  Wear shoes that:  Do not have high heels.  Have rubber bottoms.  Are comfortable and fit you well.  Are closed at the toe. Do not wear sandals.  If you use a stepladder:  Make sure that it is fully opened. Do not climb a closed stepladder.  Make sure that both sides of the stepladder are locked into place.  Ask someone to hold it for you,  if possible.  Clearly mark and make sure that you can see:  Any grab bars or handrails.  First and last steps.  Where the edge of each step is.  Use tools that help you move around (mobility aids) if they are needed. These include:  Canes.  Walkers.  Scooters.  Crutches.  Turn on the lights when you go into a dark area. Replace any light bulbs as soon as they burn out.  Set up your furniture so you have a clear path. Avoid moving your furniture around.  If any of your floors are uneven, fix them.  If there are any pets around you, be aware of where they are.  Review your medicines with your doctor. Some medicines can make you feel dizzy. This can increase your chance of falling. Ask your doctor what other things that you can do to help prevent falls. This information is not intended to replace advice given to you by your health care provider. Make sure you discuss any questions you have with your health care provider. Document Released: 08/04/2009 Document Revised: 03/15/2016 Document Reviewed: 11/12/2014 Elsevier Interactive Patient Education  2017 [employer].

## 2018-11-18 NOTE — Telephone Encounter (Signed)
Aging Disability & Transit forms Copied Noted Sleeved
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
## 2018-11-19 ENCOUNTER — Other Ambulatory Visit (HOSPITAL_COMMUNITY)
Admission: RE | Admit: 2018-11-19 | Discharge: 2018-11-19 | Disposition: A | Discharge status: Home / Self Care | Payer: Medicare Other | Source: Ambulatory Visit | Attending: Family Medicine | Admitting: Family Medicine

## 2018-11-19 DIAGNOSIS — R8290 Unspecified abnormal findings in urine: Secondary | ICD-10-CM | POA: Insufficient documentation

## 2018-11-19 LAB — URINALYSIS, ROUTINE W REFLEX MICROSCOPIC
Bilirubin Urine: NEGATIVE
GLUCOSE, UA: NEGATIVE mg/dL
Hgb urine dipstick: NEGATIVE
KETONES UR: NEGATIVE mg/dL
LEUKOCYTES UA: NEGATIVE
Nitrite: NEGATIVE
PROTEIN: NEGATIVE mg/dL
Specific Gravity, Urine: 1.014 (ref 1.005–1.030)
pH: 7 (ref 5.0–8.0)

## 2018-11-21 DIAGNOSIS — M1990 Unspecified osteoarthritis, unspecified site: Secondary | ICD-10-CM | POA: Diagnosis not present

## 2018-11-21 DIAGNOSIS — I2510 Atherosclerotic heart disease of native coronary artery without angina pectoris: Secondary | ICD-10-CM | POA: Diagnosis not present

## 2018-11-21 DIAGNOSIS — M545 Low back pain: Secondary | ICD-10-CM | POA: Diagnosis not present

## 2018-11-21 DIAGNOSIS — G8929 Other chronic pain: Secondary | ICD-10-CM | POA: Diagnosis not present

## 2018-11-21 DIAGNOSIS — Z96641 Presence of right artificial hip joint: Secondary | ICD-10-CM | POA: Diagnosis not present

## 2018-11-21 DIAGNOSIS — M80051D Age-related osteoporosis with current pathological fracture, right femur, subsequent encounter for fracture with routine healing: Secondary | ICD-10-CM | POA: Diagnosis not present

## 2018-11-21 LAB — LIPID PANEL
CHOLESTEROL: 181 mg/dL (ref <200)
HDL: 50 mg/dL — ABNORMAL LOW (ref >50)
LDL CHOLESTEROL (CALC): 100 mg/dL — AB
Non-HDL Cholesterol (Calc): 131 mg/dL (calc) — ABNORMAL HIGH (ref <130)
TRIGLYCERIDES: 194 mg/dL — AB (ref <150)
Total CHOL/HDL Ratio: 3.6 (calc) (ref <5.0)

## 2018-11-21 LAB — COMPLETE METABOLIC PANEL WITH GFR
AG Ratio: 1.1 (calc) (ref 1.0–2.5)
ALT: 10 U/L (ref 6–29)
AST: 20 U/L (ref 10–35)
Albumin: 3.3 g/dL — ABNORMAL LOW (ref 3.6–5.1)
Alkaline phosphatase (APISO): 84 U/L (ref 33–130)
BILIRUBIN TOTAL: 0.3 mg/dL (ref 0.2–1.2)
BUN / CREAT RATIO: 13 (calc) (ref 6–22)
BUN: 7 mg/dL (ref 7–25)
CHLORIDE: 102 mmol/L (ref 98–110)
CO2: 30 mmol/L (ref 20–32)
CREATININE: 0.52 mg/dL — AB (ref 0.60–0.88)
Calcium: 9.6 mg/dL (ref 8.6–10.4)
GFR, EST AFRICAN AMERICAN: 98 mL/min/{1.73_m2} (ref >=60)
GFR, Est Non African American: 85 mL/min/{1.73_m2} (ref >=60)
GLOBULIN: 3 g/dL (ref 1.9–3.7)
Glucose, Bld: 110 mg/dL (ref 65–139)
Potassium: 4.4 mmol/L (ref 3.5–5.3)
SODIUM: 141 mmol/L (ref 135–146)
TOTAL PROTEIN: 6.3 g/dL (ref 6.1–8.1)

## 2018-11-21 LAB — IRON: IRON: 47 ug/dL (ref 45–160)

## 2018-11-21 LAB — CBC
HEMATOCRIT: 34.5 % — AB (ref 35.0–45.0)
HEMOGLOBIN: 11.6 g/dL — AB (ref 11.7–15.5)
MCH: 32 pg (ref 27.0–33.0)
MCHC: 33.6 g/dL (ref 32.0–36.0)
MCV: 95 fL (ref 80.0–100.0)
MPV: 10.4 fL (ref 7.5–12.5)
Platelets: 384 10*3/uL (ref 140–400)
RBC: 3.63 10*6/uL — ABNORMAL LOW (ref 3.80–5.10)
RDW: 15.5 % — ABNORMAL HIGH (ref 11.0–15.0)
WBC: 5.8 10*3/uL (ref 3.8–10.8)

## 2018-11-21 LAB — T3, FREE: T3, Free: 3.8 pg/mL (ref 2.3–4.2)

## 2018-11-21 LAB — TSH: TSH: 5.05 mIU/L — ABNORMAL HIGH (ref 0.40–4.50)

## 2018-11-21 LAB — T4, FREE: Free T4: 1 ng/dL (ref 0.8–1.8)

## 2018-11-21 LAB — FERRITIN: Ferritin: 320 ng/mL — ABNORMAL HIGH (ref 16–288)

## 2018-11-25 ENCOUNTER — Ambulatory Visit: Payer: Medicare Other | Admitting: Family Medicine

## 2018-11-26 ENCOUNTER — Telehealth: Payer: Self-pay

## 2018-11-26 NOTE — Telephone Encounter (Signed)
Kara [name] had to cancel her follow up appt so her daughter requested that her lab results be mailed and that we call if anything was abnormal. I don't see any result message so do you want to document a note before I call/mail them?

## 2018-11-28 ENCOUNTER — Encounter: Payer: Self-pay | Admitting: *Deleted

## 2018-11-28 NOTE — Telephone Encounter (Signed)
I just spoke to pt and her daughter Oretha Milch who she is with now, pls mail the lab and the note , thanks

## 2018-12-08 DIAGNOSIS — M1990 Unspecified osteoarthritis, unspecified site: Secondary | ICD-10-CM

## 2018-12-08 DIAGNOSIS — M8000XA Age-related osteoporosis with current pathological fracture, unspecified site, initial encounter for fracture: Secondary | ICD-10-CM

## 2018-12-08 DIAGNOSIS — I2510 Atherosclerotic heart disease of native coronary artery without angina pectoris: Secondary | ICD-10-CM

## 2018-12-08 IMAGING — US US CAROTID DUPLEX BILAT
2 series · 13 of 24 positions shown · non-contrast
Comparison: 03/04/2013

CLINICAL DATA: Asymptomatic carotid bruit

EXAM:
BILATERAL CAROTID DUPLEX ULTRASOUND
TECHNIQUE: Gray scale imaging, color Doppler and duplex ultrasound were
performed of bilateral carotid and vertebral arteries in the neck.

[Series 1: us carotid duplex bilat · 0.06mm/px · 12 of 74 slices shown (1 of 2)]
[im 1/74]
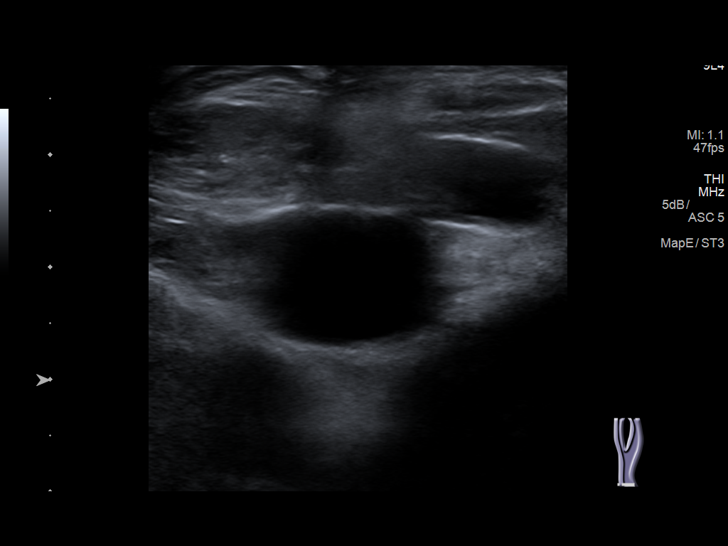
[im 7/74]
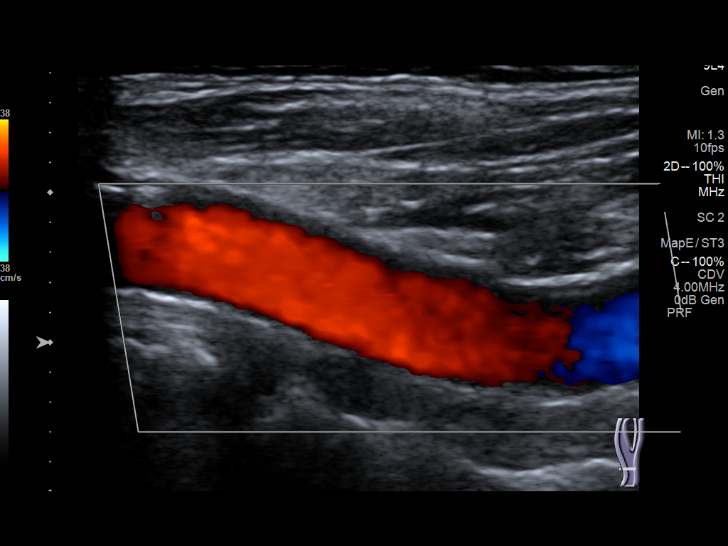
[im 14/74]
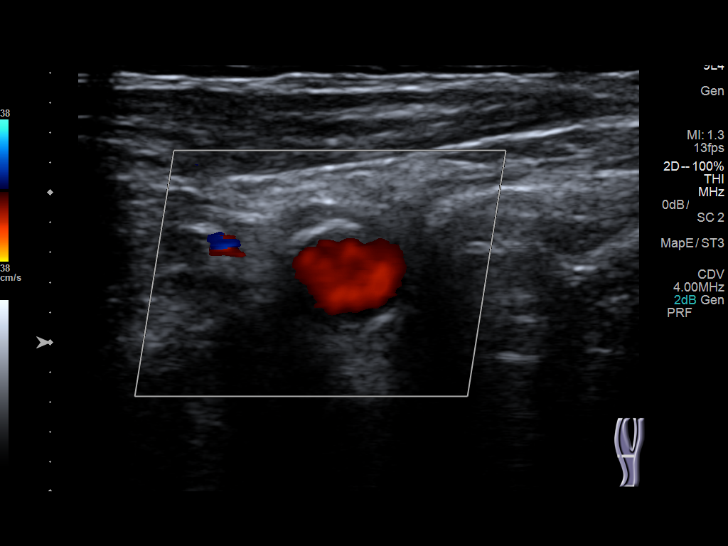
[im 20/74]
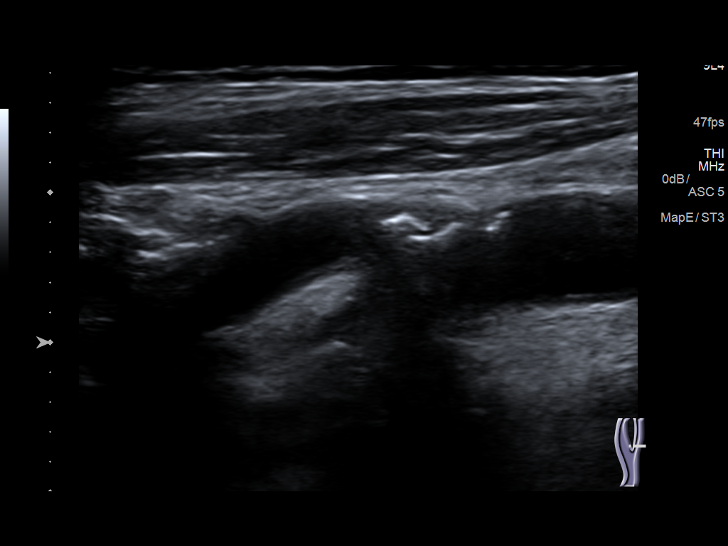
[im 27/74]
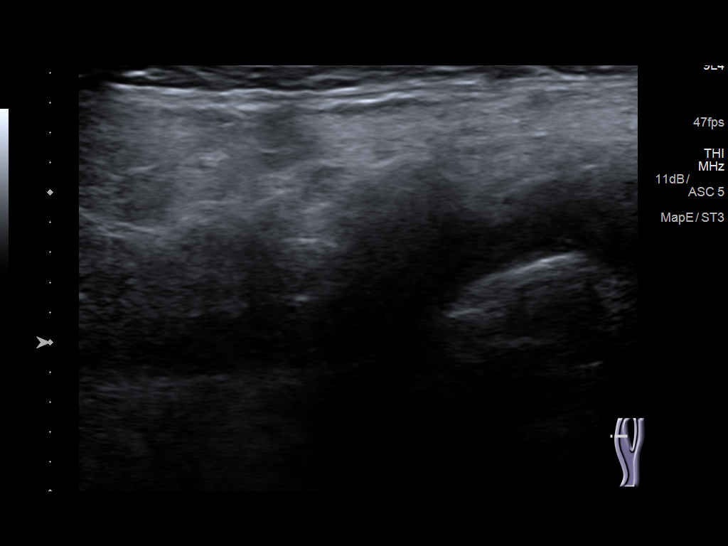
[im 34/74]
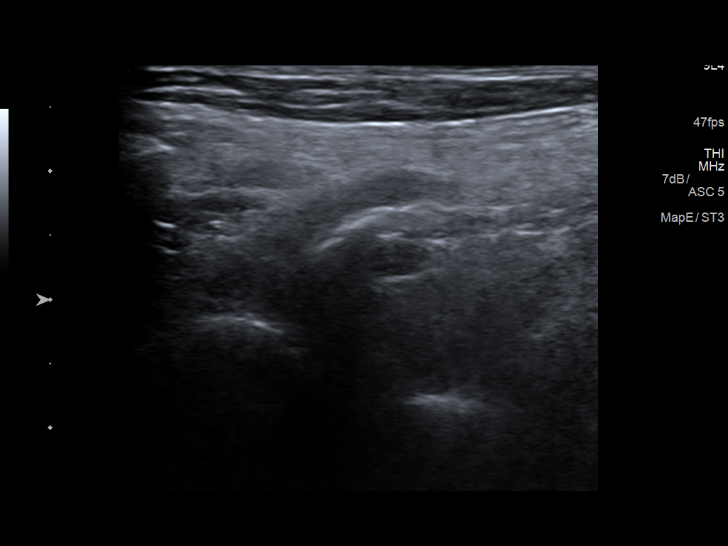
[im 40/74]
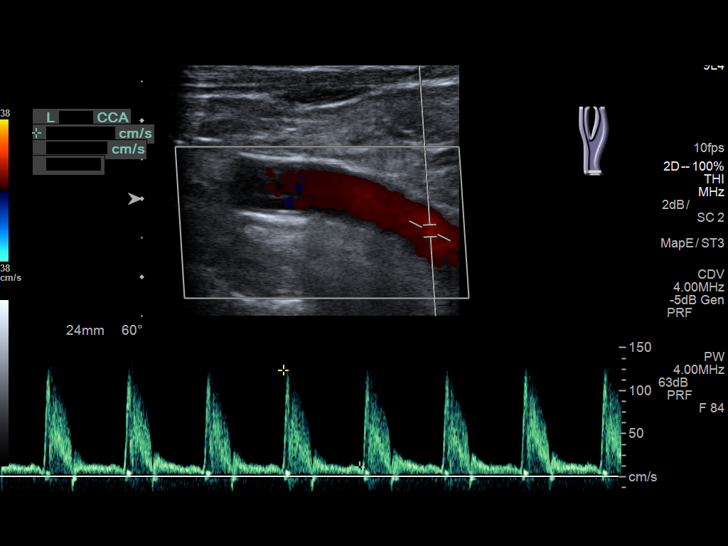
[im 44/74]
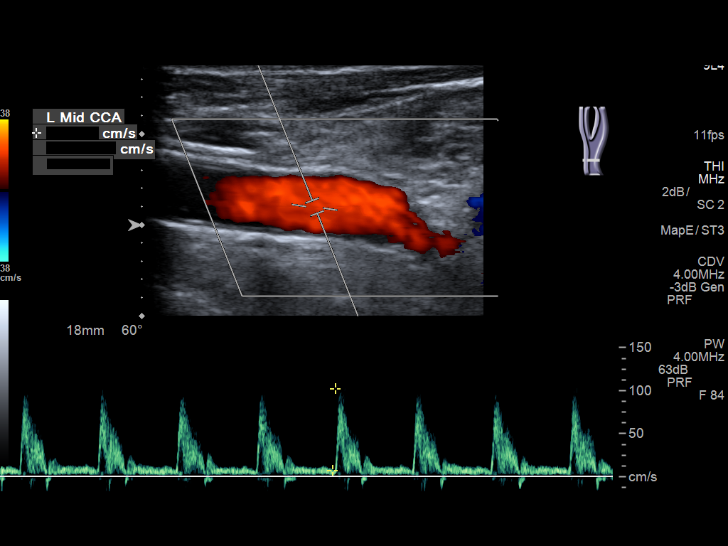
[im 50/74]
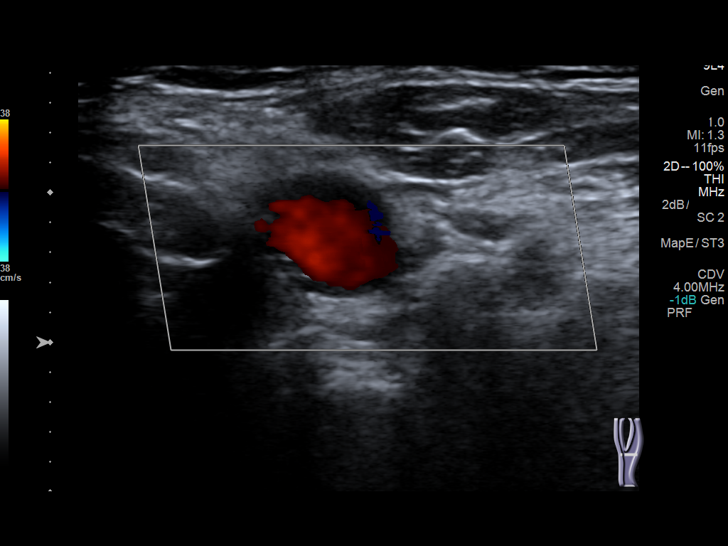
[im 57/74]
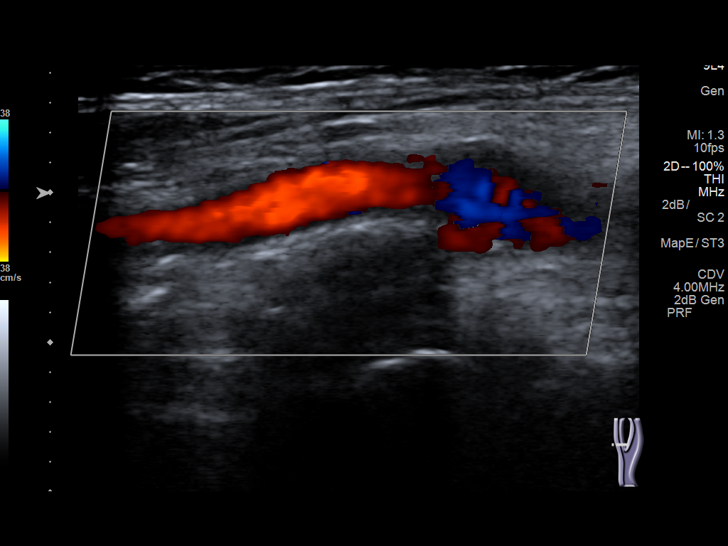
[im 64/74]
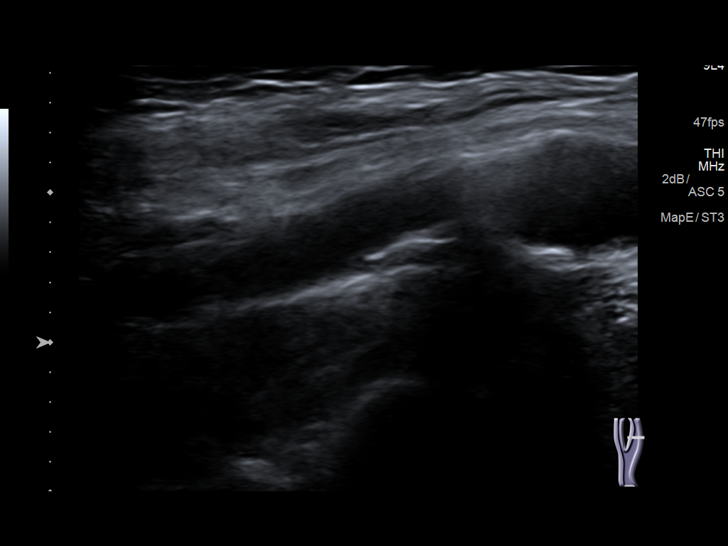
[im 70/74]
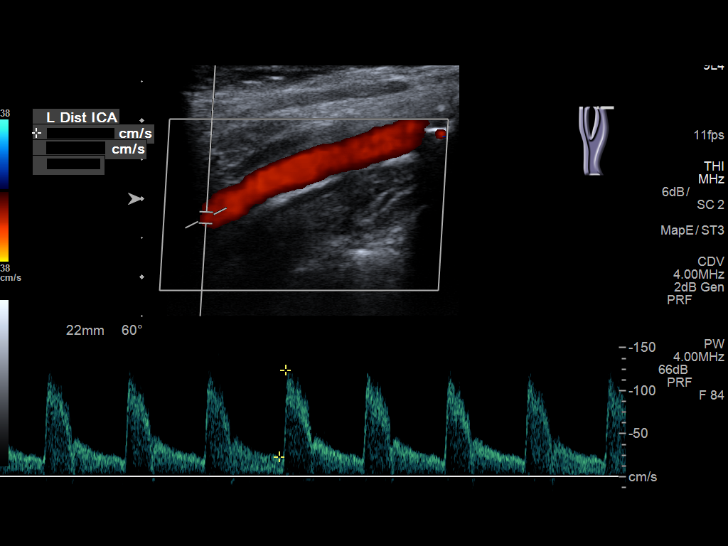

[Series 2001: us carotid duplex bilat · 1 of 1 slices shown (2 of 2)]
[im 1/1]
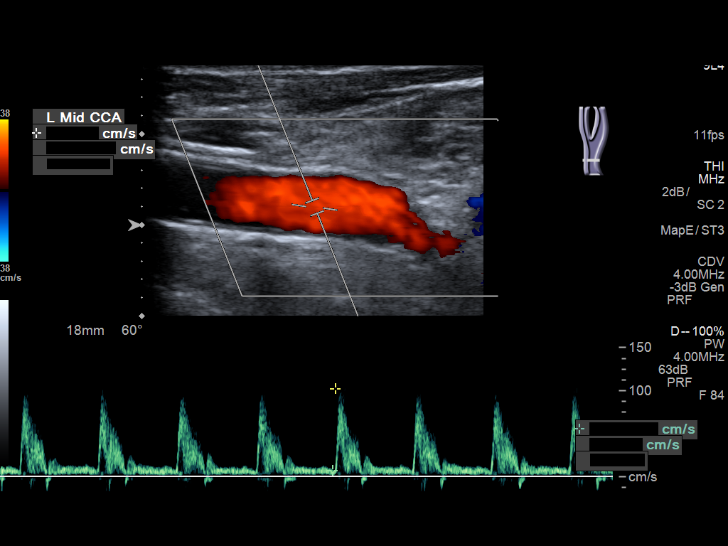

[13 of 24 positions shown; findings below may reference images not displayed]

FINDINGS: Criteria: Quantification of carotid stenosis is based on velocity
parameters that correlate the residual internal carotid diameter
with NASCET-based stenosis levels, using the diameter of the distal
internal carotid lumen as the denominator for stenosis measurement.

The following velocity measurements were obtained:

RIGHT

ICA:  98/13 cm/sec

CCA:  78/8 cm/sec

SYSTOLIC ICA/CCA RATIO:

DIASTOLIC ICA/CCA RATIO:

ECA:  322 cm/sec

LEFT

ICA:  124/23 cm/sec

CCA:  108/7 cm/sec

SYSTOLIC ICA/CCA RATIO:

DIASTOLIC ICA/CCA RATIO:

ECA:  136 cm/sec

RIGHT CAROTID ARTERY: Mild heterogeneous partially calcified right
carotid bifurcation atherosclerosis. Proximal right ECA origin
moderate stenosis noted with an elevated velocity as above. Right
ICA remains patent without a hemodynamically significant stenosis,
significant velocity elevation, or turbulent flow. Degree of right
ICA narrowing less than 50%.

RIGHT VERTEBRAL ARTERY:  Antegrade

LEFT CAROTID ARTERY: Similar scattered minor echogenic plaque
formation. No hemodynamically significant left ICA stenosis,
velocity elevation, or turbulent flow.

LEFT VERTEBRAL ARTERY:  Antegrade
IMPRESSION: Right greater than left carotid atherosclerosis. No hemodynamically
significant ICA stenosis. Degree of narrowing less than 50%
bilaterally.

Patent antegrade vertebral flow bilaterally

## 2018-12-18 ENCOUNTER — Ambulatory Visit (INDEPENDENT_AMBULATORY_CARE_PROVIDER_SITE_OTHER): Payer: Medicare Other | Admitting: Orthopaedic Surgery

## 2018-12-22 NOTE — Telephone Encounter (Signed)
Note sent to nurse. 

## 2019-01-01 ENCOUNTER — Other Ambulatory Visit: Payer: Self-pay

## 2019-01-01 ENCOUNTER — Encounter (INDEPENDENT_AMBULATORY_CARE_PROVIDER_SITE_OTHER): Payer: Self-pay | Admitting: Orthopaedic Surgery

## 2019-01-01 ENCOUNTER — Ambulatory Visit (INDEPENDENT_AMBULATORY_CARE_PROVIDER_SITE_OTHER): Payer: Medicare Other | Admitting: Orthopaedic Surgery

## 2019-01-01 ENCOUNTER — Ambulatory Visit (INDEPENDENT_AMBULATORY_CARE_PROVIDER_SITE_OTHER): Payer: Self-pay

## 2019-01-01 VITALS — BP 147/58 | HR 73 | Ht 62.0 in | Wt 112.0 lb

## 2019-01-01 DIAGNOSIS — Z96641 Presence of right artificial hip joint: Secondary | ICD-10-CM

## 2019-01-01 DIAGNOSIS — F0390 Unspecified dementia without behavioral disturbance: Secondary | ICD-10-CM

## 2019-01-01 DIAGNOSIS — M25551 Pain in right hip: Secondary | ICD-10-CM
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
## 2019-01-07 ENCOUNTER — Ambulatory Visit (HOSPITAL_COMMUNITY)
Admission: RE | Admit: 2019-01-07 | Discharge: 2019-01-07 | Disposition: A | Discharge status: Home / Self Care | Payer: Medicare Other | Source: Ambulatory Visit | Attending: Orthopaedic Surgery | Admitting: Orthopaedic Surgery

## 2019-01-07 ENCOUNTER — Other Ambulatory Visit: Payer: Self-pay

## 2019-01-07 DIAGNOSIS — S0990XA Unspecified injury of head, initial encounter: Secondary | ICD-10-CM | POA: Diagnosis not present

## 2019-01-07 DIAGNOSIS — F0390 Unspecified dementia without behavioral disturbance: Secondary | ICD-10-CM | POA: Insufficient documentation

## 2019-01-29 DIAGNOSIS — M5416 Radiculopathy, lumbar region: Secondary | ICD-10-CM | POA: Diagnosis not present

## 2019-01-29 DIAGNOSIS — M25551 Pain in right hip: Secondary | ICD-10-CM | POA: Diagnosis not present

## 2019-01-29 DIAGNOSIS — R419 Unspecified symptoms and signs involving cognitive functions and awareness: Secondary | ICD-10-CM | POA: Diagnosis not present

## 2019-01-29 DIAGNOSIS — M130 Polyarthritis, unspecified: Secondary | ICD-10-CM | POA: Diagnosis not present

## 2019-02-16 ENCOUNTER — Telehealth: Payer: Self-pay | Admitting: Cardiovascular Disease

## 2019-02-16 NOTE — Telephone Encounter (Signed)
Virtual Visit Pre-Appointment Phone Call  "(Name), I am calling you today to discuss your upcoming appointment. We are currently trying to limit exposure to the virus that causes COVID-19 by seeing patients at home rather than in the office."  1. "What is the BEST phone number to call the day of the visit?" - include this in appointment notes  2. Do you have or have access to (through a family member/friend) a smartphone with video capability that we can use for your visit?" a. If yes - list this number in appt notes as cell (if different from BEST phone #) and list the appointment type as a VIDEO visit in appointment notes b. If no - list the appointment type as a PHONE visit in appointment notes  3. Confirm consent - "In the setting of the current Covid19 crisis, you are scheduled for a (phone or video) visit with your provider on (date) at (time).  Just as we do with many in-office visits, in order for you to participate in this visit, we must obtain consent.  If you'd like, I can send this to your mychart (if signed up) or email for you to review.  Otherwise, I can obtain your verbal consent now.  All virtual visits are billed to your insurance company just like a normal visit would be.  By agreeing to a virtual visit, we'd like you to understand that the technology does not allow for your provider to perform an examination, and thus may limit your provider's ability to fully assess your condition. If your provider identifies any concerns that need to be evaluated in person, we will make arrangements to do so.  Finally, though the technology is pretty good, we cannot assure that it will always work on either your or our end, and in the setting of a video visit, we may have to convert it to a phone-only visit.  In either situation, we cannot ensure that we have a secure connection.  Are you willing to proceed?" STAFF: Did the patient verbally acknowledge consent to telehealth visit? Document  YES/NO here: Yes  4. Advise patient to be prepared - "Two hours prior to your appointment, go ahead and check your blood pressure, pulse, oxygen saturation, and your weight (if you have the equipment to check those) and write them all down. When your visit starts, your provider will ask you for this information. If you have an Apple Watch or Kardia device, please plan to have heart rate information ready on the day of your appointment. Please have a pen and paper handy nearby the day of the visit as well."  5. Give patient instructions for MyChart download to smartphone OR Doximity/Doxy.me as below if video visit (depending on what platform provider is using)  6. Inform patient they will receive a phone call 15 minutes prior to their appointment time (may be from unknown caller ID) so they should be prepared to answer    TELEPHONE CALL NOTE  Kara [name] has been deemed a candidate for a follow-up tele-health visit to limit community exposure during the Covid-19 pandemic. I spoke with the patient via phone to ensure availability of phone/video source, confirm preferred email & phone number, and discuss instructions and expectations.  I reminded Kara [name] to be prepared with any vital sign and/or heart rhythm information that could potentially be obtained via home monitoring, at the time of her visit. I reminded Kara [name] to expect a phone call prior to  her visit.  Kara [name] 02/16/2019 1:21 PM   INSTRUCTIONS FOR DOWNLOADING THE MYCHART APP TO SMARTPHONE  - The patient must first make sure to have activated MyChart and know their login information - If Apple, go to [employer] and type in MyChart in the search bar and download the app. If Android, ask patient to go to [employer] and type in [place] in the search bar and download the app. The app is free but as with any other app downloads, their phone may require them to verify saved payment information or Apple/Android  password.  - The patient will need to then log into the app with their MyChart username and password, and select [facility] as their healthcare provider to link the account. When it is time for your visit, go to the MyChart app, find appointments, and click Begin Video Visit. Be sure to Select Allow for your device to access the Microphone and Camera for your visit. You will then be connected, and your provider will be with you shortly.  **If they have any issues connecting, or need assistance please contact MyChart service desk [phone])**  **If using a computer, in order to ensure the best quality for their visit they will need to use either of the following Internet Browsers: [employer], or Google Chrome**  IF USING DOXIMITY or DOXY.ME - The patient will receive a link just prior to their visit by text.     FULL LENGTH CONSENT FOR TELE-HEALTH VISIT   I hereby voluntarily request, consent and authorize [facility] and its employed or contracted physicians, physician assistants, nurse practitioners or other licensed health care professionals (the Practitioner), to provide me with telemedicine health care services (the Services") as deemed necessary by the treating Practitioner. I acknowledge and consent to receive the Services by the Practitioner via telemedicine. I understand that the telemedicine visit will involve communicating with the Practitioner through live audiovisual communication technology and the disclosure of certain medical information by electronic transmission. I acknowledge that I have been given the opportunity to request an in-person assessment or other available alternative prior to the telemedicine visit and am voluntarily participating in the telemedicine visit.  I understand that I have the right to withhold or withdraw my consent to the use of telemedicine in the course of my care at any time, without affecting my right to future care or treatment,  and that the Practitioner or I may terminate the telemedicine visit at any time. I understand that I have the right to inspect all information obtained and/or recorded in the course of the telemedicine visit and may receive copies of available information for a reasonable fee.  I understand that some of the potential risks of receiving the Services via telemedicine include:   Delay or interruption in medical evaluation due to technological equipment failure or disruption;  Information transmitted may not be sufficient (e.g. poor resolution of images) to allow for appropriate medical decision making by the Practitioner; and/or   In rare instances, security protocols could fail, causing a breach of personal health information.  Furthermore, I acknowledge that it is my responsibility to provide information about my medical history, conditions and care that is complete and accurate to the best of my ability. I acknowledge that Practitioner's advice, recommendations, and/or decision may be based on factors not within their control, such as incomplete or inaccurate data provided by me or distortions of diagnostic images or specimens that may result from electronic transmissions. I  understand that the practice of medicine is not an exact science and that Practitioner makes no warranties or guarantees regarding treatment outcomes. I acknowledge that I will receive a copy of this consent concurrently upon execution via email to the email address I last provided but may also request a printed copy by calling the office of [facility].    I understand that my insurance will be billed for this visit.   I have read or had this consent read to me.  I understand the contents of this consent, which adequately explains the benefits and risks of the Services being provided via telemedicine.   I have been provided ample opportunity to ask questions regarding this consent and the Services and have had my questions  answered to my satisfaction.  I give my informed consent for the services to be provided through the use of telemedicine in my medical care  By participating in this telemedicine visit I agree to the above.
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
## 2019-02-24 ENCOUNTER — Telehealth: Payer: Medicare Other | Admitting: Cardiovascular Disease

## 2019-03-19 ENCOUNTER — Encounter (INDEPENDENT_AMBULATORY_CARE_PROVIDER_SITE_OTHER): Payer: Self-pay

## 2019-03-19 ENCOUNTER — Ambulatory Visit (INDEPENDENT_AMBULATORY_CARE_PROVIDER_SITE_OTHER): Payer: Medicare Other | Admitting: Family Medicine

## 2019-03-19 ENCOUNTER — Other Ambulatory Visit: Payer: Self-pay

## 2019-03-19 DIAGNOSIS — J302 Other seasonal allergic rhinitis: Secondary | ICD-10-CM | POA: Diagnosis not present

## 2019-03-19 DIAGNOSIS — M5431 Sciatica, right side: Secondary | ICD-10-CM

## 2019-03-19 DIAGNOSIS — R2681 Unsteadiness on feet: Secondary | ICD-10-CM | POA: Diagnosis not present

## 2019-03-19 DIAGNOSIS — Z7189 Other specified counseling: Secondary | ICD-10-CM

## 2019-03-19 DIAGNOSIS — G3184 Mild cognitive impairment, so stated: Secondary | ICD-10-CM | POA: Diagnosis not present

## 2019-03-19 NOTE — Progress Notes (Signed)
Virtual Visit via Telephone Note  I connected with Kara [name] on 15/40/08 at  2:40 PM EDT by telephone and verified that I am speaking with the correct person using two identifiers.  Location: Patient: home Provider: office   I discussed the limitations, risks, security and privacy concerns of performing an evaluation and management service by telephone and the availability of in person appointments. I also discussed with the patient that there may be a patient responsible charge related to this service. The patient expressed understanding and agreed to proceed. This visit type is conducted due to national recommendations for restrictions regarding the COVID -19 Pandemic. Due to the patient's age and / or co morbidities, this format is felt to be most appropriate at this time without adequate follow up. The patient has no access to video technology/ had technical difficulties with video, requiring transitioning to audio format  only ( telephone ). All issues noted this document were discussed and addressed,no physical exam can be performed in this format.  improved mobility outside of her home f/U for face to face appointment for hoiospial bed and alternate mobility equipmentg in next 2 to 3 weeks and hospital bed    History of Present Illness: C/O chronic back pain Requests hospital bed and a wheelchair that can be used outside of her home, daughter states that her light weight wheelchair is inappropriate. She will need a face to  Face appointment for equipment needs, I will also need to f/u on wheelchair alternate Daughter concerned about increased memory loss following her surgery in December , and states that unfortunately, despite surgery pt has been unable to weight bear without assistance. Additional surgery she states has been suggested but no interest currently Pt states her vision is very poor when I discussed doing word search for memory, but states she was told in the past that  no realistic surgery option for her MMSE performance good as far as counting backwards, unable to say months backward Denies recent fever or chills. Denies sinus pressure, nasal congestion, ear pain or sore throat. Denies chest congestion, productive cough or wheezing. Denies chest pains, palpitations and leg swelling Denies abdominal pain, nausea, vomiting,diarrhea or constipation.   Denies dysuria, frequency, hesitancy or incontinence.  Denies headaches, seizures, numbness, or tingling.  Denies skin break down or rash.  Most  Of history is from daughter she lives with though patient also engaged     Observations/Objective: There were no vitals taken for this visit. Good communication with no confusion and intact memory. Alert and oriented x 3 No signs of respiratory distress during sppech    Assessment and Plan: Sciatica of right side Chronic pain management through pain clinic  Seasonal allergic rhinitis Uses benadryl  As needed  Unsteady gait High fall risk , requests wheelchair for use outside of her home  MCI (mild cognitive impairment) Encouraged daughter to get a calender for pt to keep up with the date and also to help with recall of months Overall did well on the MMSE, which is reassuring  Educated About Covid-19 Virus Infection Covid-19 Education  The signs and symptoms of of COVID -19 were discussed with the patient and how to seek care for testing. ( follow up with PCP or arrange  E-visit) The importance of social  distancing is discussed today.      Follow Up Instructions:    I discussed the assessment and treatment plan with the patient. The patient was provided an opportunity to ask questions and  all were answered. The patient agreed with the plan and demonstrated an understanding of the instructions.   The patient was advised to call back or seek an in-person evaluation if the symptoms worsen or if the condition fails to improve as  anticipated.  I provided 24 minutes of non-face-to-face time during this encounter.   [name], MD

## 2019-03-19 NOTE — Patient Instructions (Addendum)
F/U for face to face appointment for hospital bed and alternate wheelchair with MD in next 2 to 3 weeks  Please get fasting labs ordered at [facility] as soon as possible  ( mail order pls of labs  Ordered in Jan 2020)  Please have Ms Kara [name] look at and mark off days and dates every day on a calender  Social distancing. Frequent hand washing with soap and water Keeping your hands off of your face. These 3 practices will help to keep both you and your community healthy during this time. Please practice them faithfully!   Thanks for choosing [facility], we consider it a privelige to serve you.

## 2019-03-20 ENCOUNTER — Encounter: Payer: Self-pay | Admitting: Family Medicine

## 2019-03-20 DIAGNOSIS — G3184 Mild cognitive impairment, so stated: Secondary | ICD-10-CM | POA: Insufficient documentation

## 2019-03-20 DIAGNOSIS — Z7189 Other specified counseling: Secondary | ICD-10-CM | POA: Insufficient documentation

## 2019-03-20 NOTE — Assessment & Plan Note (Signed)
Covid-19 Education  The signs and symptoms of of COVID -19 were discussed with the patient and how to seek care for testing. ( follow up with PCP or arrange  E-visit) The importance of social  distancing is discussed today.  

## 2019-03-20 NOTE — Assessment & Plan Note (Signed)
Encouraged daughter to get a calender for pt to keep up with the date and also to help with recall of months Overall did well on the MMSE, which is reassuring

## 2019-03-20 NOTE — Assessment & Plan Note (Signed)
High fall risk , requests wheelchair for use outside of her home

## 2019-03-20 NOTE — Assessment & Plan Note (Signed)
Chronic pain management through pain clinic ?

## 2019-03-20 NOTE — Assessment & Plan Note (Signed)
Uses benadryl  As needed

## 2019-03-30 ENCOUNTER — Ambulatory Visit: Payer: Medicare Other | Admitting: Family Medicine

## 2019-04-08 ENCOUNTER — Ambulatory Visit: Payer: Medicare Other | Admitting: Family Medicine

## 2019-04-09 ENCOUNTER — Ambulatory Visit: Payer: Medicare Other | Admitting: Family Medicine

## 2019-04-09 ENCOUNTER — Other Ambulatory Visit: Payer: Self-pay

## 2019-04-09 ENCOUNTER — Ambulatory Visit (INDEPENDENT_AMBULATORY_CARE_PROVIDER_SITE_OTHER): Payer: Medicare Other | Admitting: Family Medicine

## 2019-04-09 ENCOUNTER — Encounter: Payer: Self-pay | Admitting: Family Medicine

## 2019-04-09 VITALS — BP 118/50 | HR 74 | Temp 98.8°F | Resp 12 | Ht 63.0 in | Wt 92.0 lb

## 2019-04-09 DIAGNOSIS — M1990 Unspecified osteoarthritis, unspecified site: Secondary | ICD-10-CM

## 2019-04-09 DIAGNOSIS — M8000XS Age-related osteoporosis with current pathological fracture, unspecified site, sequela: Secondary | ICD-10-CM

## 2019-04-09 DIAGNOSIS — E041 Nontoxic single thyroid nodule: Secondary | ICD-10-CM | POA: Diagnosis not present

## 2019-04-09 DIAGNOSIS — E785 Hyperlipidemia, unspecified: Secondary | ICD-10-CM | POA: Diagnosis not present

## 2019-04-09 DIAGNOSIS — E43 Unspecified severe protein-calorie malnutrition: Secondary | ICD-10-CM | POA: Diagnosis not present

## 2019-04-09 DIAGNOSIS — R8290 Unspecified abnormal findings in urine: Secondary | ICD-10-CM | POA: Diagnosis not present

## 2019-04-09 DIAGNOSIS — D539 Nutritional anemia, unspecified: Secondary | ICD-10-CM | POA: Diagnosis not present

## 2019-04-09 DIAGNOSIS — S32030S Wedge compression fracture of third lumbar vertebra, sequela: Secondary | ICD-10-CM

## 2019-04-09 MED ORDER — MIRTAZAPINE 7.5 MG PO TABS: 7.5000 mg | ORAL_TABLET | Freq: Every day | ORAL | 0 refills | Status: DC
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
## 2019-04-09 NOTE — Patient Instructions (Signed)
Thank you for coming into the office today. I appreciate the opportunity to provide you with the care for your health and wellness. Today we discussed: hospital bed and weight   Follow Up: 3 weeks for wt check  Please pick up Remeron medication and start taking as directed. Call if you have issues or problems.  Please enjoy any foods you like. Getting your weight back up is vital to your health.  We will work to get the hospital bed ordered.   Please continue to practice social distancing.   Clay YOUR HANDS WELL AND FREQUENTLY. AVOID TOUCHING YOUR FACE, UNLESS YOUR HANDS ARE FRESHLY WASHED.  GET FRESH AIR DAILY. STAY HYDRATED WITH WATER.   It was a pleasure to see you and I look forward to continuing to work together on your health and well-being. Please do not hesitate to call the office if you need care or have questions about your care.  Have a wonderful day and week.  With Gratitude,  Cherly Beach, DNP, AGNP-BC   Protein-Energy Malnutrition Protein-energy malnutrition is when a person does not eat enough protein, fat, and calories. When this happens over time, it can lead to severe loss of muscle tissue (muscle wasting). This condition also affects the body's defense system (immune system) and can lead to other health problems. What are the causes? This condition may be caused by:  Not eating enough protein, fat, or calories.  Having certain chronic medical conditions.  Eating too little. What increases the risk? The following factors may make you more likely to develop this condition:  Living in poverty.  Long-term hospitalization.  Alcohol or drug dependency. Addiction often leads to a lifestyle in which proper diet is ignored. Dependency can also hurt the metabolism and the body's ability to absorb nutrients.  Eating disorders, such as anorexia nervosa or bulimia.  Chewing or swallowing problems. People with these disorders may not eat enough.   Having certain conditions, such as: ? Inflammatory bowel disease. Inflammation of the intestines makes it difficult for the body to absorb nutrients. ? Cancer or AIDS. These diseases can cause a loss of appetite. ? Chronic heart failure. This interferes with how the body uses nutrients. ? Cystic fibrosis. This disease can make it difficult for the body to absorb nutrients.  Eating a diet that extremely restricts protein, fat, or calorie intake. What are the signs or symptoms? Symptoms of this condition include:  Fatigue.  Weakness.  Dizziness.  Fainting.  Weight loss.  Loss of muscle tone and muscle mass.  Poor immune response.  Lack of menstruation.  Poor memory.  Hair loss.  Skin changes. How is this diagnosed? This condition may be diagnosed based on:  Your medical and dietary history.  A physical exam. This may include a measurement of your body mass index (BMI).  Blood tests. How is this treated? This condition may be managed with:  Nutrition therapy. This may include working with a diet and nutrition specialist (dietitian).  Treatment for underlying conditions. People with severe protein-energy malnutrition may need to be treated in a hospital. This may involve receiving nutrition and fluids through an IV. Follow these instructions at home:   Eat a balanced diet. In each meal, include at least one food that is high in protein. Foods that are high in protein include: ? Meat. ? Poultry. ? Fish. ? Eggs. ? Cheese. ? Milk. ? Beans. ? Nuts.  Eat nutrient-rich foods that are easy to swallow and digest, such as: ?  Fruit and yogurt smoothies. ? Oatmeal with nut butter.  Try to eat six small meals each day instead of three large meals.  Take vitamin and protein supplements as told by your health care provider or dietitian.  Follow your health care provider's recommendations about exercise and activity.  Keep all follow-up visits as told by your health  care provider. This is important. Contact a health care provider if you:  Have increased weakness or fatigue.  Faint.  Are a woman and you stop having your period (menstruating).  Have rapid hair loss.  Have unexpected weight loss.  Have diarrhea.  Have nausea and vomiting. Get help right away if you have:  Difficulty breathing.  Chest pain. Summary  Protein-energy malnutrition is when a person does not eat enough protein, fat, and calories.  Protein-energy malnutrition can lead to severe loss of muscle tissue (muscle wasting). This condition also affects the body's defense system (immune system) and can lead to other health problems.  Talk with your health care provider about treatment for this condition. Effective treatment depends on the underlying cause of the malnutrition. This information is not intended to replace advice given to you by your health care provider. Make sure you discuss any questions you have with your health care provider. Document Released: 08/24/2005 Document Revised: 10/23/2017 Document Reviewed: 10/23/2017 Elsevier Interactive Patient Education  2019 [employer].

## 2019-04-10 LAB — URINALYSIS
Bilirubin Urine: NEGATIVE
GLUCOSE, UA: NEGATIVE
Hgb urine dipstick: NEGATIVE
Ketones, ur: NEGATIVE
Nitrite: NEGATIVE
PROTEIN: NEGATIVE
Specific Gravity, Urine: 1.008 (ref 1.001–1.03)
pH: 7 (ref 5.0–8.0)

## 2019-04-10 LAB — COMPLETE METABOLIC PANEL WITH GFR
AG Ratio: 1.6 (calc) (ref 1.0–2.5)
ALT: 10 U/L (ref 6–29)
AST: 19 U/L (ref 10–35)
Albumin: 4.1 g/dL (ref 3.6–5.1)
Alkaline phosphatase (APISO): 48 U/L (ref 37–153)
BUN/Creatinine Ratio: 16 (calc) (ref 6–22)
BUN: 9 mg/dL (ref 7–25)
CO2: 29 mmol/L (ref 20–32)
Calcium: 9.5 mg/dL (ref 8.6–10.4)
Chloride: 103 mmol/L (ref 98–110)
Creat: 0.58 mg/dL — ABNORMAL LOW (ref 0.60–0.88)
GFR, Est African American: 95 mL/min/{1.73_m2} (ref >=60)
GFR, Est Non African American: 82 mL/min/{1.73_m2} (ref >=60)
GLUCOSE: 100 mg/dL — AB (ref 65–99)
Globulin: 2.5 g/dL (calc) (ref 1.9–3.7)
Potassium: 4.7 mmol/L (ref 3.5–5.3)
Sodium: 140 mmol/L (ref 135–146)
Total Bilirubin: 0.4 mg/dL (ref 0.2–1.2)
Total Protein: 6.6 g/dL (ref 6.1–8.1)

## 2019-04-10 LAB — IRON: Iron: 98 ug/dL (ref 45–160)

## 2019-04-10 LAB — CBC
HCT: 32.3 % — ABNORMAL LOW (ref 35.0–45.0)
Hemoglobin: 10.9 g/dL — ABNORMAL LOW (ref 11.7–15.5)
MCH: 32.9 pg (ref 27.0–33.0)
MCHC: 33.7 g/dL (ref 32.0–36.0)
MCV: 97.6 fL (ref 80.0–100.0)
MPV: 10.7 fL (ref 7.5–12.5)
Platelets: 214 10*3/uL (ref 140–400)
RBC: 3.31 10*6/uL — AB (ref 3.80–5.10)
RDW: 15.4 % — ABNORMAL HIGH (ref 11.0–15.0)
WBC: 5.9 10*3/uL (ref 3.8–10.8)

## 2019-04-10 LAB — LIPID PANEL
CHOLESTEROL: 214 mg/dL — AB (ref <200)
HDL: 56 mg/dL (ref >=50)
LDL Cholesterol (Calc): 132 mg/dL (calc) — ABNORMAL HIGH
Non-HDL Cholesterol (Calc): 158 mg/dL (calc) — ABNORMAL HIGH (ref <130)
Total CHOL/HDL Ratio: 3.8 (calc) (ref <5.0)
Triglycerides: 143 mg/dL (ref <150)

## 2019-04-10 LAB — TSH: TSH: 5.96 m[IU]/L — AB (ref 0.40–4.50)

## 2019-04-10 LAB — FERRITIN: Ferritin: 172 ng/mL (ref 16–288)

## 2019-04-14 DIAGNOSIS — M25551 Pain in right hip: Secondary | ICD-10-CM | POA: Diagnosis not present

## 2019-04-14 DIAGNOSIS — R419 Unspecified symptoms and signs involving cognitive functions and awareness: Secondary | ICD-10-CM | POA: Diagnosis not present

## 2019-04-14 DIAGNOSIS — M5416 Radiculopathy, lumbar region: Secondary | ICD-10-CM | POA: Diagnosis not present

## 2019-04-14 DIAGNOSIS — M130 Polyarthritis, unspecified: Secondary | ICD-10-CM | POA: Diagnosis not present

## 2019-04-14 NOTE — Progress Notes (Signed)
Blood levels are within her baseline range. Encourage ensure to get iron and vitamins needed. Iron level okay, iron stores a lower than before, but still in range. Thyroid level is elevated. Appears to have been elevated for last year. Has anyone mentioned hypothyroid before? Will need to do another lab to verify.  I will places these in the chart and she can go when able.  Urine did have some white blood cells, is she having symptoms of frequency, urgency, burning, itching? If so, I will treat her. If not encourage water consumption.  Cholesterol is elevated ** please ask if she got these while fasting, cause that might be why they are elevated. If not fasting then that explains it and she can continue to eat anything she wants. If she was fasting just try to Avoid fried and fatty foods.  Kidney and liver are good. Continue hydration.

## 2019-04-15 ENCOUNTER — Other Ambulatory Visit: Payer: Self-pay | Admitting: Family Medicine

## 2019-04-15 DIAGNOSIS — N390 Urinary tract infection, site not specified: Secondary | ICD-10-CM

## 2019-04-15 MED ORDER — CIPROFLOXACIN-CIPROFLOX HCL ER 500 MG PO TB24: 500.0000 mg | ORAL_TABLET | Freq: Every day | ORAL | 0 refills | Status: AC

## 2019-04-30 ENCOUNTER — Other Ambulatory Visit: Payer: Self-pay

## 2019-04-30 ENCOUNTER — Ambulatory Visit (INDEPENDENT_AMBULATORY_CARE_PROVIDER_SITE_OTHER): Payer: Medicare Other | Admitting: Family Medicine

## 2019-04-30 ENCOUNTER — Encounter: Payer: Self-pay | Admitting: Family Medicine

## 2019-04-30 VITALS — BP 130/70 | HR 92 | Resp 12 | Ht 61.0 in | Wt 94.0 lb

## 2019-04-30 DIAGNOSIS — R350 Frequency of micturition: Secondary | ICD-10-CM

## 2019-04-30 DIAGNOSIS — R946 Abnormal results of thyroid function studies: Secondary | ICD-10-CM | POA: Diagnosis not present

## 2019-04-30 DIAGNOSIS — E785 Hyperlipidemia, unspecified: Secondary | ICD-10-CM

## 2019-04-30 DIAGNOSIS — F419 Anxiety disorder, unspecified: Secondary | ICD-10-CM | POA: Diagnosis not present

## 2019-04-30 DIAGNOSIS — M8000XS Age-related osteoporosis with current pathological fracture, unspecified site, sequela: Secondary | ICD-10-CM

## 2019-04-30 DIAGNOSIS — M7061 Trochanteric bursitis, right hip: Secondary | ICD-10-CM

## 2019-04-30 DIAGNOSIS — R636 Underweight: Secondary | ICD-10-CM

## 2019-04-30 DIAGNOSIS — M549 Dorsalgia, unspecified: Secondary | ICD-10-CM | POA: Diagnosis not present

## 2019-04-30 LAB — POCT URINALYSIS DIP (CLINITEK)
Bilirubin, UA: NEGATIVE
Glucose, UA: NEGATIVE mg/dL
Ketones, POC UA: NEGATIVE mg/dL
Leukocytes, UA: NEGATIVE
Nitrite, UA: NEGATIVE
POC PROTEIN,UA: NEGATIVE
Spec Grav, UA: 1.01 (ref 1.010–1.025)
Urobilinogen, UA: 0.2 E.U./dL
pH, UA: 6.5 (ref 5.0–8.0)

## 2019-04-30 NOTE — Patient Instructions (Signed)
F/U in 3 months, call if you need me before  Will try to get manual wheelchair, based on today's visit   Please  stop  The snuff   Try the Remeron please  You are  Referred for thyroid US , will schedule and let you know  Coupons for Ensure , and a bottle from nurse   Thanks for choosing [facility], we consider it a privelige to serve you.  Urine [place] sent for c/s

## 2019-05-04 ENCOUNTER — Other Ambulatory Visit (HOSPITAL_COMMUNITY)
Admission: RE | Admit: 2019-05-04 | Discharge: 2019-05-04 | Disposition: A | Discharge status: Home / Self Care | Payer: Medicare Other | Source: Ambulatory Visit | Attending: Family Medicine | Admitting: Family Medicine

## 2019-05-04 DIAGNOSIS — R350 Frequency of micturition: Secondary | ICD-10-CM | POA: Diagnosis not present

## 2019-05-04 LAB — URINALYSIS, ROUTINE W REFLEX MICROSCOPIC
Bilirubin Urine: NEGATIVE
Glucose, UA: NEGATIVE mg/dL
Hgb urine dipstick: NEGATIVE
Ketones, ur: NEGATIVE mg/dL
Leukocytes,Ua: NEGATIVE
Nitrite: NEGATIVE
Protein, ur: NEGATIVE mg/dL
Specific Gravity, Urine: 1.004 — ABNORMAL LOW (ref 1.005–1.030)
pH: 6 (ref 5.0–8.0)

## 2019-05-05 LAB — URINE CULTURE: Culture: NO GROWTH

## 2019-05-07 ENCOUNTER — Telehealth: Payer: Self-pay | Admitting: *Deleted

## 2019-05-07 ENCOUNTER — Telehealth: Payer: Self-pay | Admitting: Family Medicine

## 2019-05-07 ENCOUNTER — Encounter: Payer: Self-pay | Admitting: Family Medicine

## 2019-05-07 DIAGNOSIS — R350 Frequency of micturition: Secondary | ICD-10-CM | POA: Insufficient documentation

## 2019-05-07 DIAGNOSIS — F419 Anxiety disorder, unspecified: Secondary | ICD-10-CM | POA: Insufficient documentation

## 2019-05-07 DIAGNOSIS — R946 Abnormal results of thyroid function studies: Secondary | ICD-10-CM | POA: Insufficient documentation

## 2019-05-07 DIAGNOSIS — R636 Underweight: Secondary | ICD-10-CM | POA: Insufficient documentation

## 2019-05-07 NOTE — Telephone Encounter (Signed)
I have it scheduled for July 21 at 1:15 at [facility] radiology. Can you let them know

## 2019-05-07 NOTE — Assessment & Plan Note (Signed)
Pain managed through pain clinic

## 2019-05-07 NOTE — Addendum Note (Signed)
Addended by: Eual Fines on: 05/07/2019 09:07 AM   Modules accepted: Orders

## 2019-05-07 NOTE — Progress Notes (Signed)
   CHESNIE CAPELL     MRN: [id number]      DOB: June 04, 1927   HPI Ms. Branscom is here for follow up and re-evaluation of chronic medical conditions, medication management and review of any available recent lab and radiology data.  Daughters accompany her and are concerned that she still is not eating enough and is underweight , states she never did take the Remeron. C/o constipation, however on high dose narcotic with poor oral intake and reduced mobility. Stool reportedly hard, increased stool softeners encouraged   Requests manual wheelchair , that she can self propel in for increased independence  ROS Denies recent fever or chills. Denies sinus pressure, nasal congestion, ear pain or sore throat. Denies chest congestion, productive cough or wheezing. Denies chest pains, palpitations and leg swelling Denies abdominal pain, nausea, vomiting,diarrhea  C/o frequency of urination and  incontinence. Denies uncontrolled  joint pain, swelling and limitation in mobility. Denies headaches, seizures, numbness, or tingling. Denies depression, uncontrolled anxiety or insomnia. Denies skin break down or rash.   PE  BP 130/70   Pulse 92   Resp 12   Ht 5\' 1"  (1.549 m)   Wt 94 lb (42.6 kg)   SpO2 97%   BMI 17.76 kg/m   Patient alert and oriented and in no cardiopulmonary distress.  HEENT: No facial asymmetry, EOMI,   oropharynx pink and moist.  Neck decreased ROM no JVD, no mass.  Chest: Clear to auscultation bilaterally.  CVS: S1, S2 no murmurs, no S3.Regular rate.  ABD: Soft non tender.   Ext: No edema  MS: decreased  ROM lumbar spine,adequate in shoulders, hips and knees.  Skin: Intact, no ulcerations or rash noted.  Psych: Good eye contact, normal affect. Memory intact not anxious or depressed appearing.  CNS: CN 2-12 intact, power,  normal throughout.nmuscle wasting and educed tone in lower extremities   Assessment & Plan  Closed compression fracture of L3 lumbar vertebra  (HCC) Chronic lumbar pain limiting ability to walk.  Upper extremity strength is sufficient for patient to self propel a light wheelchair , which will allow greater independence, will request same pr request of both the patient and her family.  Hyperlipidemia LDL goal <100 Hyperlipidemia:Low fat diet discussed and encouraged.   Lipid Panel  Lab Results  Component Value Date   CHOL 214 (H) 04/09/2019   HDL 56 04/09/2019   LDLCALC 132 (H) 04/09/2019   TRIG 143 04/09/2019   CHOLHDL 3.8 04/09/2019  Needs to reduce fried and fatty foods     Urinary frequency Mildly symptomatic with an UA, will send for c/s, no antibiotic prescribed at visit  Abnormal thyroid function test Elevated TSH on several occasions though free T3 and T4 are normal, will order thyroid US  Back pain without radiation Pain managed through pain clinic  Underweight due to inadequate caloric intake Encouraged minimum of 2 cans ensure plus daily and also to take Remeron prescribed  Anxiety Bed time klonopin is prescribed

## 2019-05-07 NOTE — Assessment & Plan Note (Signed)
Chronic lumbar pain limiting ability to walk.  Upper extremity strength is sufficient for patient to self propel a light wheelchair , which will allow greater independence, will request same pr request of both the patient and her family.

## 2019-05-07 NOTE — Assessment & Plan Note (Signed)
Hyperlipidemia:Low fat diet discussed and encouraged.   Lipid Panel  Lab Results  Component Value Date   CHOL 214 (H) 04/09/2019   HDL 56 04/09/2019   LDLCALC 132 (H) 04/09/2019   TRIG 143 04/09/2019   CHOLHDL 3.8 04/09/2019  Needs to reduce fried and fatty foods

## 2019-05-07 NOTE — Assessment & Plan Note (Signed)
Mildly symptomatic with an UA, will send for c/s, no antibiotic prescribed at visit

## 2019-05-07 NOTE — Assessment & Plan Note (Signed)
Elevated TSH on several occasions though free T3 and T4 are normal, will order thyroid US

## 2019-05-07 NOTE — Telephone Encounter (Signed)
Called [name] and let her know appt details and that urine culture was fine per brandi

## 2019-05-07 NOTE — Assessment & Plan Note (Signed)
Encouraged minimum of 2 cans ensure plus daily and also to take Remeron prescribed

## 2019-05-07 NOTE — Telephone Encounter (Signed)
pls see last visit in 04/2019. Thyroid US  Needs to be scheduled , also pt and family request manual wheelchair, note addresses this , pls see if you can obtain for pt, thanks

## 2019-05-07 NOTE — Assessment & Plan Note (Signed)
Bed time klonopin is prescribed

## 2019-05-07 NOTE — Telephone Encounter (Signed)
Pts daughters called (both were on the phone) said Kara [name] was supposed to have her thyroid checked at the hospital and no one has called to set this appointment up and they wanted to know how to get it scheduled. Also they wanted to know the results of her urine culture that she had done in the office. Please call [phone] and ask for [facility]

## 2019-05-08 NOTE — Telephone Encounter (Signed)
Wheelchair order sent to [place]. Thyroid US scheduled and daughter aware

## 2019-05-12 ENCOUNTER — Other Ambulatory Visit: Payer: Self-pay

## 2019-05-12 ENCOUNTER — Ambulatory Visit (HOSPITAL_COMMUNITY)
Admission: RE | Admit: 2019-05-12 | Discharge: 2019-05-12 | Disposition: A | Discharge status: Home / Self Care | Payer: Medicare Other | Source: Ambulatory Visit | Attending: Family Medicine | Admitting: Family Medicine

## 2019-05-12 DIAGNOSIS — R946 Abnormal results of thyroid function studies: Secondary | ICD-10-CM | POA: Diagnosis not present

## 2019-05-13 ENCOUNTER — Telehealth: Payer: Self-pay | Admitting: *Deleted

## 2019-05-13 NOTE — Telephone Encounter (Signed)
Veatrice Bourbon called about Kara [name] returning a call about her ultrasound on her thyroid

## 2019-05-14 NOTE — Telephone Encounter (Signed)
Kara [name] called back asking the results of thyroid

## 2019-05-15 NOTE — Telephone Encounter (Signed)
Spoke with [name] and gave results.

## 2019-05-18 ENCOUNTER — Other Ambulatory Visit: Payer: Self-pay | Admitting: *Deleted

## 2019-06-01 ENCOUNTER — Other Ambulatory Visit: Payer: Self-pay

## 2019-06-01 DIAGNOSIS — E43 Unspecified severe protein-calorie malnutrition: Secondary | ICD-10-CM

## 2019-06-01 MED ORDER — MIRTAZAPINE 7.5 MG PO TABS: 7.5000 mg | ORAL_TABLET | Freq: Every day | ORAL | 0 refills | Status: DC

## 2019-06-11 DIAGNOSIS — M130 Polyarthritis, unspecified: Secondary | ICD-10-CM | POA: Diagnosis not present

## 2019-06-11 DIAGNOSIS — R419 Unspecified symptoms and signs involving cognitive functions and awareness: Secondary | ICD-10-CM | POA: Diagnosis not present

## 2019-06-11 DIAGNOSIS — M25551 Pain in right hip: Secondary | ICD-10-CM | POA: Diagnosis not present

## 2019-06-11 DIAGNOSIS — M5416 Radiculopathy, lumbar region: Secondary | ICD-10-CM | POA: Diagnosis not present

## 2019-06-18 ENCOUNTER — Other Ambulatory Visit: Payer: Self-pay

## 2019-06-18 DIAGNOSIS — E43 Unspecified severe protein-calorie malnutrition: Secondary | ICD-10-CM

## 2019-06-18 MED ORDER — MIRTAZAPINE 7.5 MG PO TABS: 7.5000 mg | ORAL_TABLET | Freq: Every day | ORAL | 0 refills | Status: DC

## 2019-07-06 ENCOUNTER — Encounter: Payer: Self-pay | Admitting: Internal Medicine

## 2019-07-30 ENCOUNTER — Encounter: Payer: Self-pay | Admitting: Family Medicine

## 2019-07-30 ENCOUNTER — Ambulatory Visit (INDEPENDENT_AMBULATORY_CARE_PROVIDER_SITE_OTHER): Payer: Medicare Other | Admitting: Family Medicine

## 2019-07-30 ENCOUNTER — Other Ambulatory Visit: Payer: Self-pay

## 2019-07-30 VITALS — BP 146/50 | HR 76 | Temp 98.7°F | Resp 15 | Ht 62.0 in | Wt 96.1 lb

## 2019-07-30 DIAGNOSIS — E43 Unspecified severe protein-calorie malnutrition: Secondary | ICD-10-CM

## 2019-07-30 DIAGNOSIS — K219 Gastro-esophageal reflux disease without esophagitis: Secondary | ICD-10-CM

## 2019-07-30 DIAGNOSIS — B351 Tinea unguium: Secondary | ICD-10-CM | POA: Diagnosis not present

## 2019-07-30 DIAGNOSIS — Z23 Encounter for immunization: Secondary | ICD-10-CM

## 2019-07-30 DIAGNOSIS — E785 Hyperlipidemia, unspecified: Secondary | ICD-10-CM | POA: Diagnosis not present

## 2019-07-30 MED ORDER — OMEPRAZOLE 20 MG PO CPDR: 20.0000 mg | DELAYED_RELEASE_CAPSULE | Freq: Every day | ORAL | 1 refills | Status: DC
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
## 2019-07-30 NOTE — Patient Instructions (Addendum)
   I appreciate the opportunity to provide you with the care for your health and wellness. Today we discussed: toe nail, sleep, and overall health  Follow up: 3 months by phone   Labs placed today. Get these fasting. 1 week before next appt. Referral to Podiatry placed  Banks job on gaining 2 pounds! This is wonderful! So happy to see this. Continue to eat well and drink ensure.  Please continue to practice social distancing to keep you, your family, and our community safe.  If you must go out, please wear a Mask and practice good handwashing.  GET FRESH AIR DAILY. STAY HYDRATED WITH WATER.   It was a pleasure to see you and I look forward to continuing to work together on your health and well-being. Please do not hesitate to call the office if you need care or have questions about your care.  Have a wonderful day and week. With Gratitude, Cherly Beach, DNP, AGNP-BC

## 2019-08-11 ENCOUNTER — Other Ambulatory Visit: Payer: Self-pay | Admitting: Nurse Practitioner

## 2019-08-11 DIAGNOSIS — K219 Gastro-esophageal reflux disease without esophagitis: Secondary | ICD-10-CM

## 2019-08-14 ENCOUNTER — Ambulatory Visit: Payer: No Typology Code available for payment source | Admitting: Podiatry

## 2019-08-14 IMAGING — CT CT HEAD WITHOUT CONTRAST
3 series · 16 of 47 positions shown, 19 images · non-contrast
Comparison: Paranasal sinus CT 07/10/2007.

CLINICAL DATA: 89-year-old female with multiple recent falls.

EXAM:
CT HEAD WITHOUT CONTRAST
TECHNIQUE: Contiguous axial images were obtained from the base of the skull
through the vertex without intravenous contrast.

[Series 2: head trauma wo · axial · 0.42mm/px · z∈[+14,+154]mm · 10 of 34 slices shown, 13 images]
[im 3/34  brain]
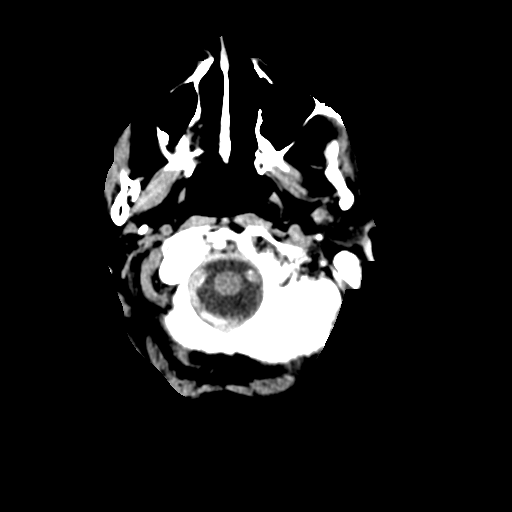
[im 3/34  bone]
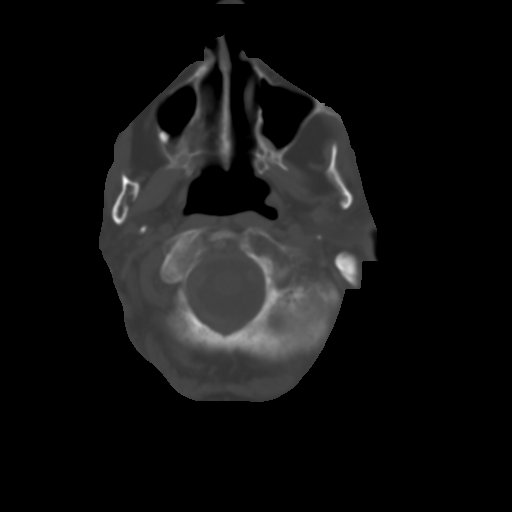
[im 6/34  brain]
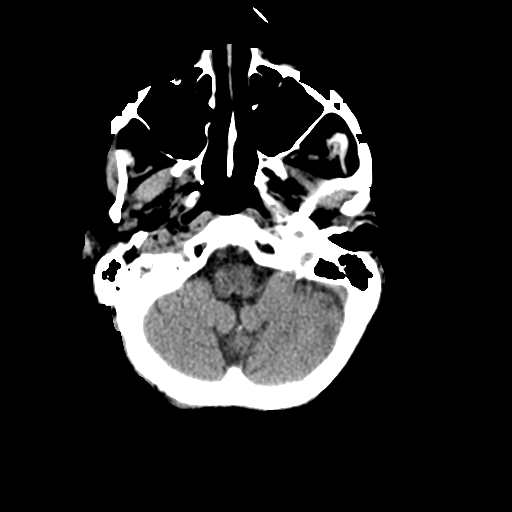
[im 10/34  brain]
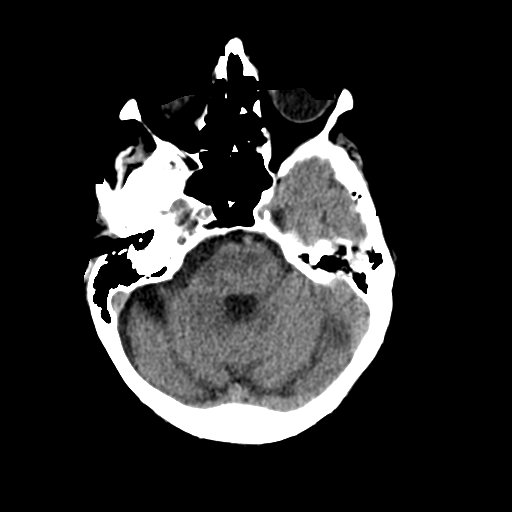
[im 12/34  brain]
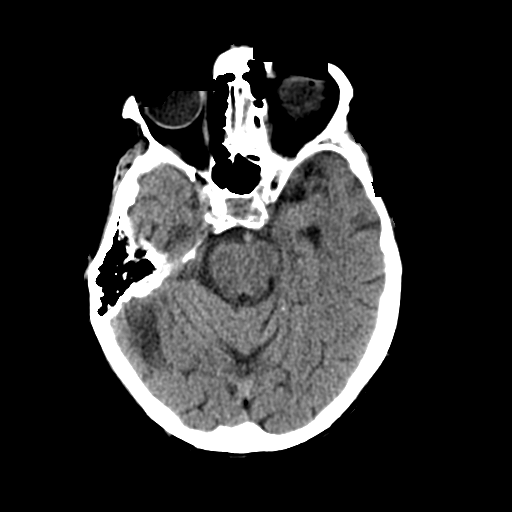
[im 15/34  brain]
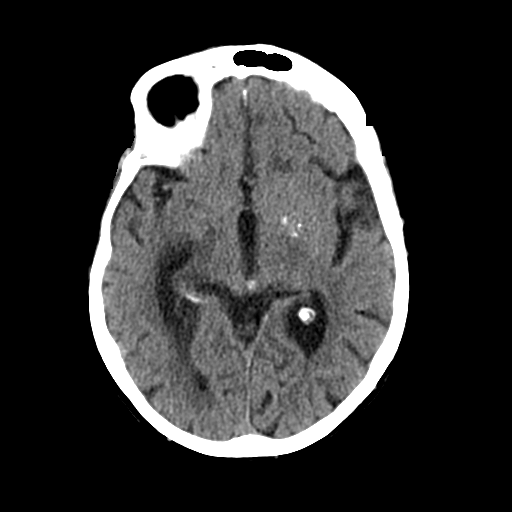
[im 15/34  bone]
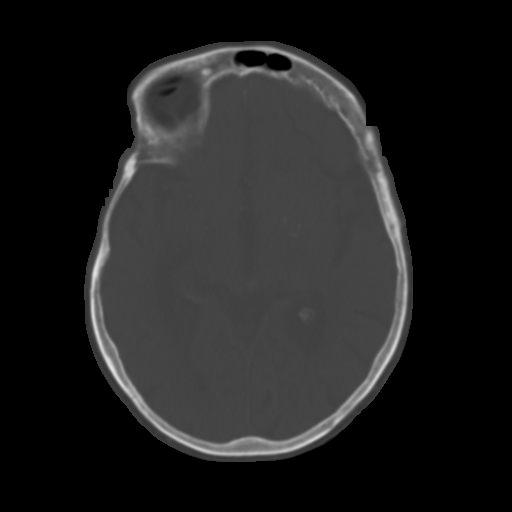
[im 19/34  brain]
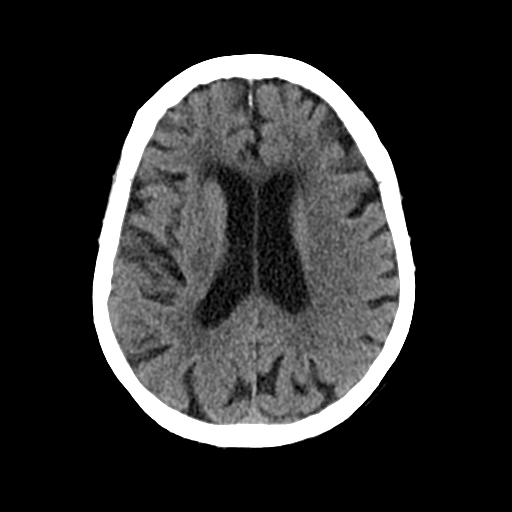
[im 22/34  brain]
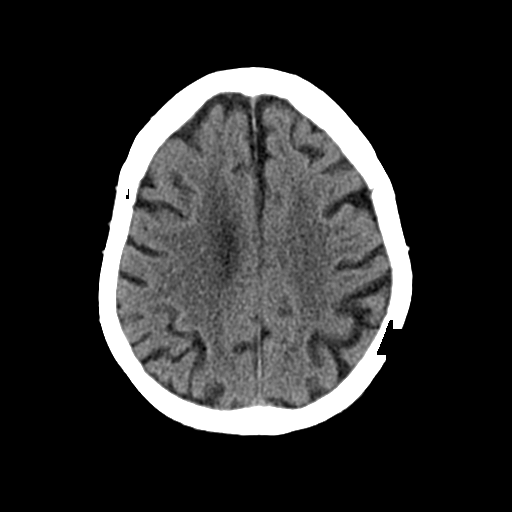
[im 26/34  brain]
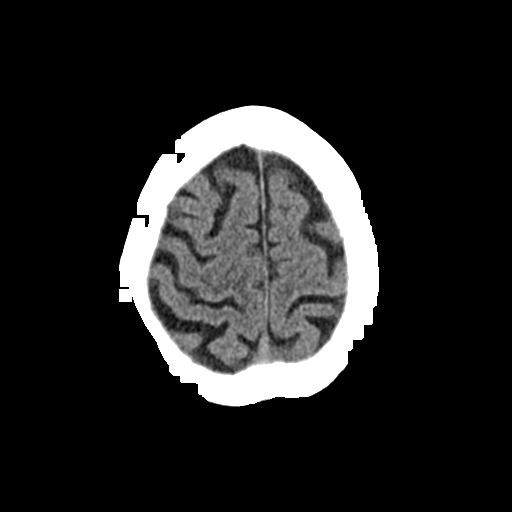
[im 28/34  brain]
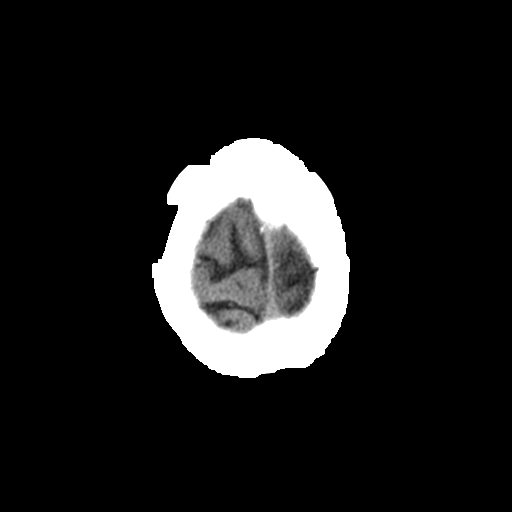
[im 28/34  bone]
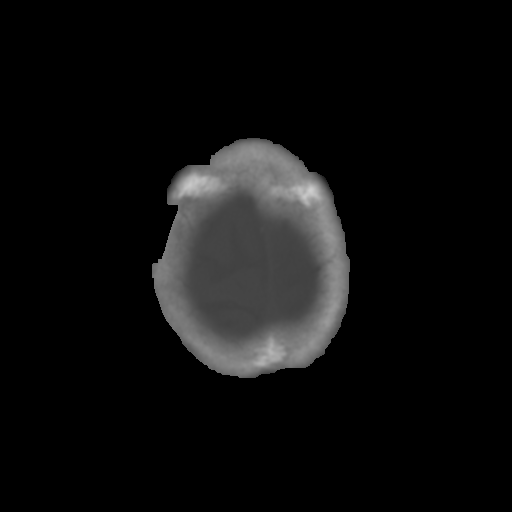
[im 31/34  brain]
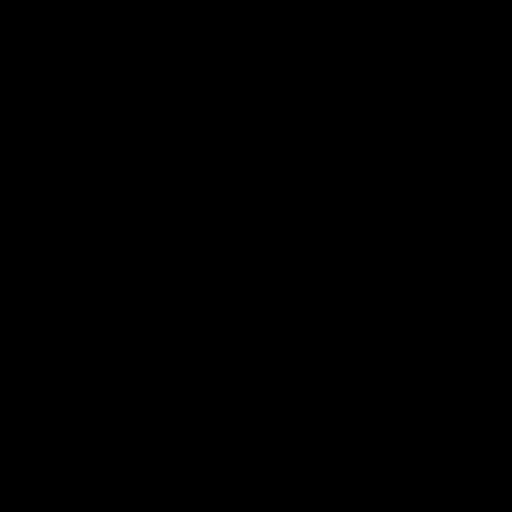

[Series 4: coronal soft tissue · coronal · 0.32mm/px · 3 of 65 slices shown]
[im 22/65  brain]
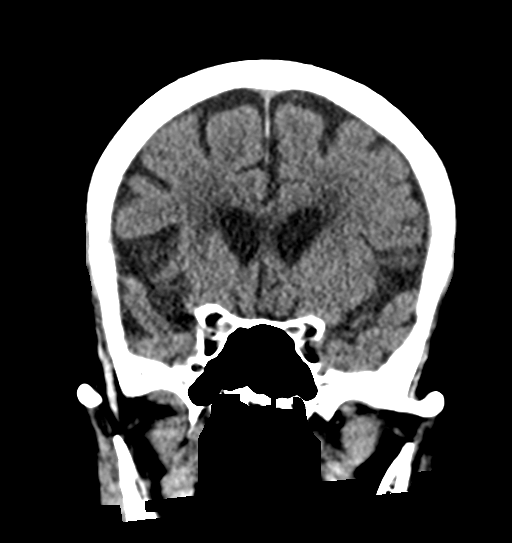
[im 29/65  brain]
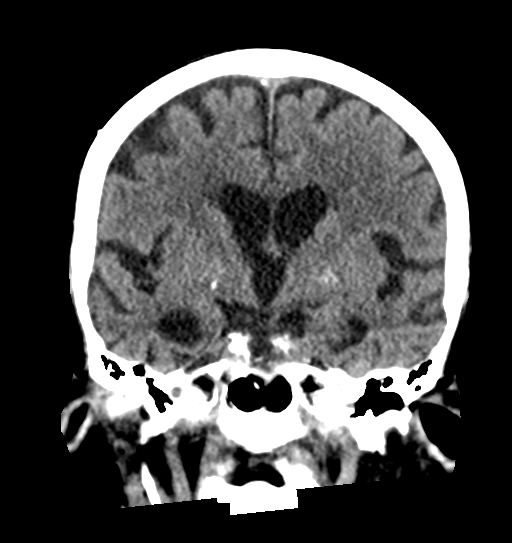
[im 36/65  brain]
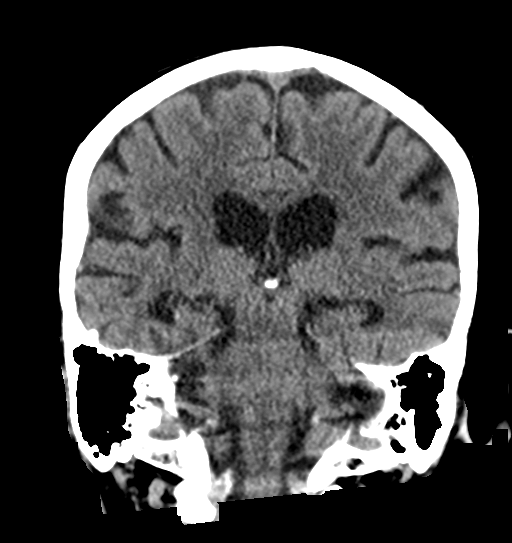

[Series 5: sagittal soft tissue · sagittal · 0.35mm/px · 3 of 52 slices shown]
[im 18/52  brain]
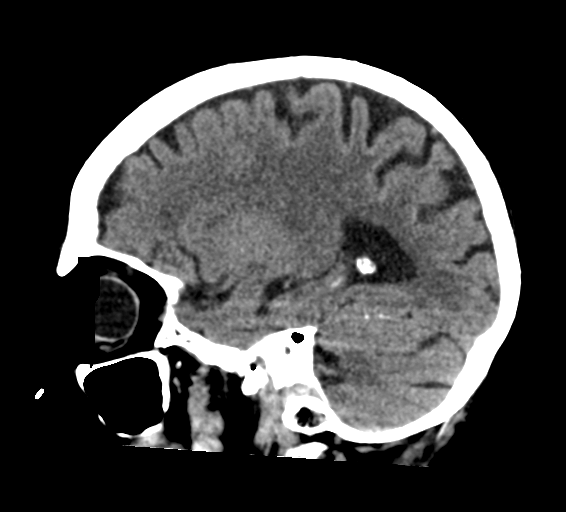
[im 26/52  brain]
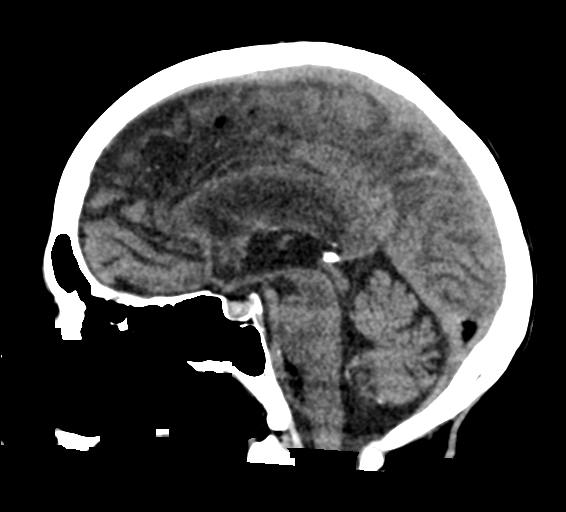
[im 35/52  brain]
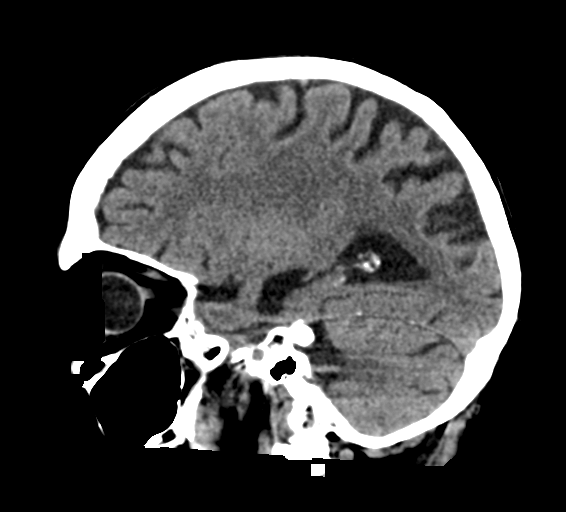

[16 of 47 positions shown; findings below may reference images not displayed]

FINDINGS: Brain: No midline shift, mass effect, or evidence of intracranial
mass lesion. No ventriculomegaly. No acute intracranial hemorrhage
identified.

Patchy and confluent bilateral cerebral white matter hypodensity.
Small dystrophic calcifications in the basal ganglia. No cortically
based acute infarct identified. No cortical encephalomalacia
identified.

Vascular: Calcified atherosclerosis at the skull base. No suspicious
intracranial vascular hyperdensity.

Skull: Osteopenia.  No acute osseous abnormality identified.

Sinuses/Orbits: Visualized paranasal sinuses and mastoids are
generally well pneumatized and improved since 0221.

Other: No acute orbit or scalp soft tissue finding.
IMPRESSION: 1. No acute intracranial abnormality or acute traumatic injury
identified.
2. Cerebral white matter changes most commonly due to chronic small
vessel disease.

## 2019-09-24 DIAGNOSIS — M545 Low back pain: Secondary | ICD-10-CM | POA: Diagnosis not present

## 2019-09-24 DIAGNOSIS — B078 Other viral warts: Secondary | ICD-10-CM | POA: Diagnosis not present

## 2019-09-24 DIAGNOSIS — R419 Unspecified symptoms and signs involving cognitive functions and awareness: Secondary | ICD-10-CM | POA: Diagnosis not present

## 2019-09-24 DIAGNOSIS — M130 Polyarthritis, unspecified: Secondary | ICD-10-CM | POA: Diagnosis not present

## 2019-09-24 DIAGNOSIS — M25551 Pain in right hip: Secondary | ICD-10-CM | POA: Diagnosis not present

## 2019-09-24 DIAGNOSIS — M79604 Pain in right leg: Secondary | ICD-10-CM | POA: Diagnosis not present

## 2019-09-24 DIAGNOSIS — M5416 Radiculopathy, lumbar region: Secondary | ICD-10-CM | POA: Diagnosis not present

## 2019-10-14 ENCOUNTER — Telehealth: Payer: Self-pay | Admitting: *Deleted

## 2019-10-14 NOTE — Telephone Encounter (Signed)
Spoke with [name]. Advised her that patient needs to have Covid testing done. She stated that even if she had the testing done and it came back positive, they were just going to tell her to treat her symptoms at home. She stated she is trying to keep her fever down, keep her from coughing so much. Advised her if patient becomes short of breath or coughs so much she can't catch her breath or decreases her eating and drinking she needs to be evaluated by the ED immediately all with verbal understanding. Will forward to provider on call as an FYI.

## 2019-10-14 NOTE — Telephone Encounter (Signed)
Returned Rhonda's call at the number left. No answer. Left vm stating patient needs to have Covid testing due to symptoms and she has been exposed by the other daughter. Will try again later.

## 2019-10-14 NOTE — Telephone Encounter (Signed)
Kara [name] pts daugther called stating her mom has been running a fever highest was 101.3 she has had a bad hacking cough and has been exposed to covid by her other daughter. Let her know she should probably be tested she said her mom is bed bound. Wanted to know what the recommendations were for treatment.

## 2019-10-21 ENCOUNTER — Telehealth: Payer: Self-pay

## 2019-10-21 NOTE — Telephone Encounter (Signed)
noted 

## 2019-10-21 NOTE — Telephone Encounter (Signed)
FYI

## 2019-10-21 NOTE — Telephone Encounter (Signed)
Pt has tested Covid +, no symptoms other than the headache, all the caretakers are covid + also.  I advised if they needed anything to please dont hesitate to call us.

## 2019-11-03 ENCOUNTER — Encounter: Payer: Self-pay | Admitting: Family Medicine

## 2019-11-03 ENCOUNTER — Ambulatory Visit (INDEPENDENT_AMBULATORY_CARE_PROVIDER_SITE_OTHER): Payer: Medicare Other | Admitting: Family Medicine

## 2019-11-03 ENCOUNTER — Other Ambulatory Visit: Payer: Self-pay

## 2019-11-03 VITALS — BP 137/63 | Ht 62.0 in | Wt 96.0 lb

## 2019-11-03 DIAGNOSIS — M549 Dorsalgia, unspecified: Secondary | ICD-10-CM

## 2019-11-03 DIAGNOSIS — R636 Underweight: Secondary | ICD-10-CM | POA: Diagnosis not present

## 2019-11-03 DIAGNOSIS — N8111 Cystocele, midline: Secondary | ICD-10-CM

## 2019-11-03 DIAGNOSIS — H04123 Dry eye syndrome of bilateral lacrimal glands: Secondary | ICD-10-CM | POA: Diagnosis not present

## 2019-11-03 DIAGNOSIS — R350 Frequency of micturition: Secondary | ICD-10-CM

## 2019-11-03 DIAGNOSIS — K449 Diaphragmatic hernia without obstruction or gangrene: Secondary | ICD-10-CM

## 2019-11-03 DIAGNOSIS — F419 Anxiety disorder, unspecified: Secondary | ICD-10-CM

## 2019-11-03 NOTE — Progress Notes (Signed)
Virtual Visit via Telephone Note  I connected with Kara [name] on [id number] at  2:40 PM EST by telephone and verified that I am speaking with the correct person using two identifiers.  Location: Patient: home Provider: office   I discussed the limitations, risks, security and privacy concerns of performing an evaluation and management service by telephone and the availability of in person appointments. I also discussed with the patient that there may be a patient responsible charge related to this service. The patient expressed understanding and agreed to proceed.   History of Present Illness: F/U chronic problems, medication review, and refill medication when necessary. Review most recent labs and order labs which are due Review preventive health and update with necessary referrals or immunizations as indicated Denies recent fever or chills. C/o dry eyes Denies sinus pressure, nasal congestion, ear pain or sore throat. Denies chest congestion, productive cough or wheezing. Denies chest pains, palpitations and leg swelling Denies abdominal pain, nausea, vomiting,diarrhea or constipation.   Denies dysuria,c/o urinary frequency C/o chronic  joint pain, swelling and limitation in mobility. Denies headaches, seizures, numbness, or tingling. Denies depression,uncontrolled  anxiety and mild  insomnia. Denies skin break down or rash.        Observations/Objective: BP 137/63   Ht 5\' 2"  (1.575 m)   Wt 96 lb (43.5 kg)   BMI 17.56 kg/m  Good communication with no confusion and intact memory. Alert and oriented x 3 No signs of respiratory distress during speech    Assessment and Plan: Dry eyes Advised to use OTC lubricant, 2 to 3 times daily  Anxiety Controlled, no change in medication   Back pain without radiation Managed by neurology, adequate pain control  Cystocele, midline Urinary frequency and pressure, check CCUA to r/o infection  Hiatal hernia Continue  PPI  Underweight due to inadequate caloric intake Frequent small meals encouraged to promote weight gain    Follow Up Instructions:    I discussed the assessment and treatment plan with the patient. The patient was provided an opportunity to ask questions and all were answered. The patient agreed with the plan and demonstrated an understanding of the instructions.   The patient was advised to call back or seek an in-person evaluation if the symptoms worsen or if the condition fails to improve as anticipated.  I provided 15 minutes of non-face-to-face time during this encounter.   [name], MD

## 2019-11-03 NOTE — Patient Instructions (Signed)
F/U in office with MD end August , call if you need me sooner  Please reschedule wellness f to later time on 01/29 per family and pt request  Please update mailing address to home of daughter where she stays in [place]  Please get fasting labs in February or March, which are already ordered    Nurse pls send in order for [place] and c/s , pt will have this done at the lab whnen she goes for blood draw, dx is urinary frequency   Please be careful not to fall.  I recommend using minimum medication needed for pain , and continue to discuss need for pain and anxiety management with Pre scriber A . Powell.    Thanks for choosing [facility], we consider it a privelige to serve you.  Best for [id number]

## 2019-11-05 ENCOUNTER — Ambulatory Visit: Payer: Medicare Other | Admitting: Family Medicine

## 2019-11-07 DIAGNOSIS — H04123 Dry eye syndrome of bilateral lacrimal glands: Secondary | ICD-10-CM | POA: Insufficient documentation

## 2019-11-07 NOTE — Assessment & Plan Note (Signed)
Urinary frequency and pressure, check CCUA to r/o infection

## 2019-11-07 NOTE — Assessment & Plan Note (Signed)
Frequent small meals encouraged to promote weight gain

## 2019-11-07 NOTE — Assessment & Plan Note (Signed)
Continue PPI ?

## 2019-11-07 NOTE — Assessment & Plan Note (Signed)
Advised to use OTC lubricant, 2 to 3 times daily

## 2019-11-07 NOTE — Assessment & Plan Note (Signed)
Controlled, no change in medication  

## 2019-11-07 NOTE — Assessment & Plan Note (Signed)
Managed by neurology, adequate pain control

## 2019-11-20 ENCOUNTER — Encounter: Payer: Medicare Other | Admitting: Family Medicine

## 2019-11-23 ENCOUNTER — Ambulatory Visit: Payer: Medicare Other

## 2019-11-26 ENCOUNTER — Ambulatory Visit (INDEPENDENT_AMBULATORY_CARE_PROVIDER_SITE_OTHER): Payer: Medicare Other | Admitting: Family Medicine

## 2019-11-26 ENCOUNTER — Encounter: Payer: Self-pay | Admitting: Family Medicine

## 2019-11-26 ENCOUNTER — Other Ambulatory Visit: Payer: Self-pay

## 2019-11-26 VITALS — BP 137/63 | HR 76 | Resp 15 | Ht 62.0 in | Wt 96.0 lb

## 2019-11-26 DIAGNOSIS — Z0000 Encounter for general adult medical examination without abnormal findings: Secondary | ICD-10-CM
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
## 2019-11-26 NOTE — Patient Instructions (Signed)
Kara [name] , Thank you for taking time to come for your Medicare Wellness Visit. I appreciate your ongoing commitment to your health goals. Please review the following plan we discussed and let me know if I can assist you in the future.   Please continue to practice social distancing to keep you, your family, and our community safe.  If you must go out, please wear a Mask and practice good handwashing.  Screening recommendations/referrals: Colonoscopy: n/a Mammogram: n/a Bone Density: continue calcium and vitamin D  Recommended yearly ophthalmology/optometry visit for glaucoma screening and checkup Recommended yearly dental visit for hygiene and checkup  Vaccinations: Up to date on vaccines  Advanced directives: completed   Conditions/risks identified: Falls  Next appointment:  March follow up; One year for AWV   Preventive Care 55 Years and Older, Female Preventive care refers to lifestyle choices and visits with your health care provider that can promote health and wellness. What does preventive care include?  A yearly physical exam. This is also called an annual well check.  Dental exams once or twice a year.  Routine eye exams. Ask your health care provider how often you should have your eyes checked.  Personal lifestyle choices, including:  Daily care of your teeth and gums.  Regular physical activity.  Eating a healthy diet.  Avoiding tobacco and drug use.  Limiting alcohol use.  Practicing safe sex.  Taking low-dose aspirin every day.  Taking vitamin and mineral supplements as recommended by your health care provider. What happens during an annual well check? The services and screenings done by your health care provider during your annual well check will depend on your age, overall health, lifestyle risk factors, and family history of disease. Counseling  Your health care provider may ask you questions about your:  Alcohol use.  Tobacco use.  Drug  use.  Emotional well-being.  Home and relationship well-being.  Sexual activity.  Eating habits.  History of falls.  Memory and ability to understand (cognition).  Work and work Statistician.  Reproductive health. Screening  You may have the following tests or measurements:  Height, weight, and BMI.  Blood pressure.  Lipid and cholesterol levels. These may be checked every 5 years, or more frequently if you are over 53 years old.  Skin check.  Lung cancer screening. You may have this screening every year starting at age 65 if you have a 30-pack-year history of smoking and currently smoke or have quit within the past 15 years.  Fecal occult blood test (FOBT) of the stool. You may have this test every year starting at age 59.  Flexible sigmoidoscopy or colonoscopy. You may have a sigmoidoscopy every 5 years or a colonoscopy every 10 years starting at age 81.  Hepatitis C blood test.  Hepatitis B blood test.  Sexually transmitted disease (STD) testing.  Diabetes screening. This is done by checking your blood sugar (glucose) after you have not eaten for a while (fasting). You may have this done every 1-3 years.  Bone density scan. This is done to screen for osteoporosis. You may have this done starting at age 9.  Mammogram. This may be done every 1-2 years. Talk to your health care provider about how often you should have regular mammograms. Talk with your health care provider about your test results, treatment options, and if necessary, the need for more tests. Vaccines  Your health care provider may recommend certain vaccines, such as:  Influenza vaccine. This is recommended every year.  Tetanus, diphtheria, and acellular pertussis (Tdap, Td) vaccine. You may need a Td booster every 10 years.  Zoster vaccine. You may need this after age 44.  Pneumococcal 13-valent conjugate (PCV13) vaccine. One dose is recommended after age 84.  Pneumococcal polysaccharide  (PPSV23) vaccine. One dose is recommended after age 5. Talk to your health care provider about which screenings and vaccines you need and how often you need them. This information is not intended to replace advice given to you by your health care provider. Make sure you discuss any questions you have with your health care provider. Document Released: 11/04/2015 Document Revised: 06/27/2016 Document Reviewed: 08/09/2015 Elsevier Interactive Patient Education  2017 [place] Prevention in the Home Falls can cause injuries. They can happen to people of all ages. There are many things you can do to make your home safe and to help prevent falls. What can I do on the outside of my home?  Regularly fix the edges of walkways and driveways and fix any cracks.  Remove anything that might make you trip as you walk through a door, such as a raised step or threshold.  Trim any bushes or trees on the path to your home.  Use bright outdoor lighting.  Clear any walking paths of anything that might make someone trip, such as rocks or tools.  Regularly check to see if handrails are loose or broken. Make sure that both sides of any steps have handrails.  Any raised decks and porches should have guardrails on the edges.  Have any leaves, snow, or ice cleared regularly.  Use sand or salt on walking paths during winter.  Clean up any spills in your garage right away. This includes oil or grease spills. What can I do in the bathroom?  Use night lights.  Install grab bars by the toilet and in the tub and shower. Do not use towel bars as grab bars.  Use non-skid mats or decals in the tub or shower.  If you need to sit down in the shower, use a plastic, non-slip stool.  Keep the floor dry. Clean up any water that spills on the floor as soon as it happens.  Remove soap buildup in the tub or shower regularly.  Attach bath mats securely with double-sided non-slip rug tape.  Do not have  throw rugs and other things on the floor that can make you trip. What can I do in the bedroom?  Use night lights.  Make sure that you have a light by your bed that is easy to reach.  Do not use any sheets or blankets that are too big for your bed. They should not hang down onto the floor.  Have a firm chair that has side arms. You can use this for support while you get dressed.  Do not have throw rugs and other things on the floor that can make you trip. What can I do in the kitchen?  Clean up any spills right away.  Avoid walking on wet floors.  Keep items that you use a lot in easy-to-reach places.  If you need to reach something above you, use a strong step stool that has a grab bar.  Keep electrical cords out of the way.  Do not use floor polish or wax that makes floors slippery. If you must use wax, use non-skid floor wax.  Do not have throw rugs and other things on the floor that can make you trip. What can I do  with my stairs?  Do not leave any items on the stairs.  Make sure that there are handrails on both sides of the stairs and use them. Fix handrails that are broken or loose. Make sure that handrails are as long as the stairways.  Check any carpeting to make sure that it is firmly attached to the stairs. Fix any carpet that is loose or worn.  Avoid having throw rugs at the top or bottom of the stairs. If you do have throw rugs, attach them to the floor with carpet tape.  Make sure that you have a light switch at the top of the stairs and the bottom of the stairs. If you do not have them, ask someone to add them for you. What else can I do to help prevent falls?  Wear shoes that:  Do not have high heels.  Have rubber bottoms.  Are comfortable and fit you well.  Are closed at the toe. Do not wear sandals.  If you use a stepladder:  Make sure that it is fully opened. Do not climb a closed stepladder.  Make sure that both sides of the stepladder are  locked into place.  Ask someone to hold it for you, if possible.  Clearly mark and make sure that you can see:  Any grab bars or handrails.  First and last steps.  Where the edge of each step is.  Use tools that help you move around (mobility aids) if they are needed. These include:  Canes.  Walkers.  Scooters.  Crutches.  Turn on the lights when you go into a dark area. Replace any light bulbs as soon as they burn out.  Set up your furniture so you have a clear path. Avoid moving your furniture around.  If any of your floors are uneven, fix them.  If there are any pets around you, be aware of where they are.  Review your medicines with your doctor. Some medicines can make you feel dizzy. This can increase your chance of falling. Ask your doctor what other things that you can do to help prevent falls. This information is not intended to replace advice given to you by your health care provider. Make sure you discuss any questions you have with your health care provider. Document Released: 08/04/2009 Document Revised: 03/15/2016 Document Reviewed: 11/12/2014 Elsevier Interactive Patient Education  2017 [employer].

## 2019-12-17 IMAGING — US US THYROID
1 series · 14 of 25 positions shown · non-contrast
Comparison: 12/04/2013

CLINICAL DATA: Abnormal thyroid function test , abnormal TSH

EXAM:
THYROID ULTRASOUND
TECHNIQUE: Ultrasound examination of the thyroid gland and adjacent soft
tissues was performed.

[Series 1: us thyroid · 14 of 57 slices shown]
[im 1/57]
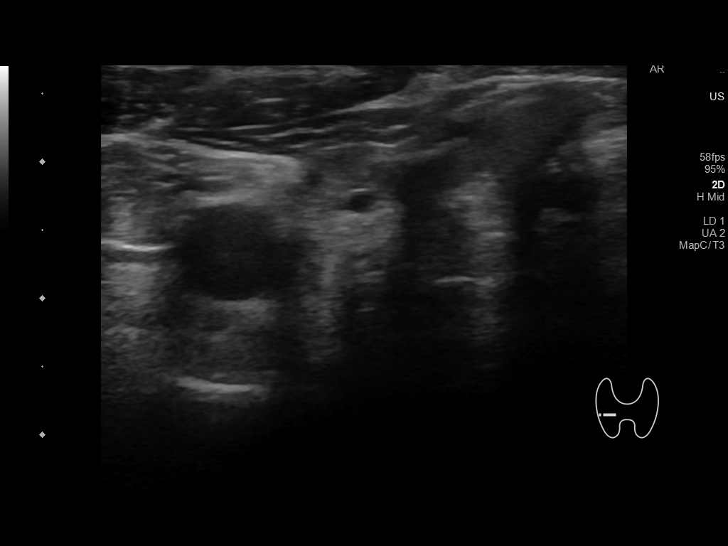
[im 5/57]
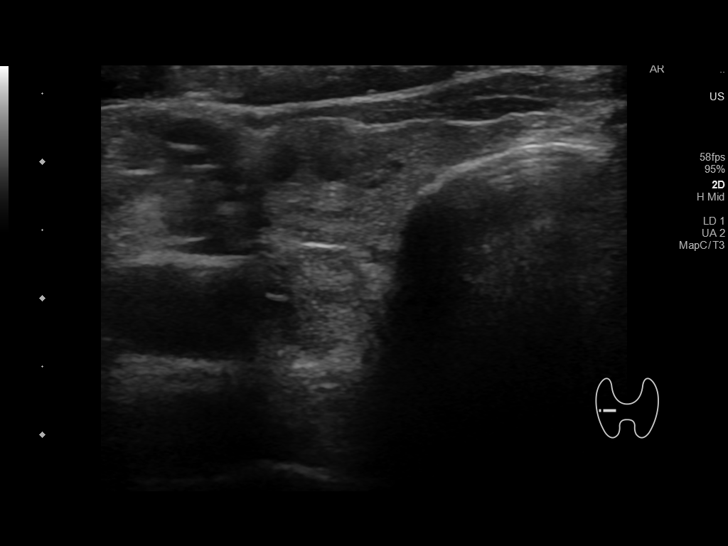
[im 10/57]
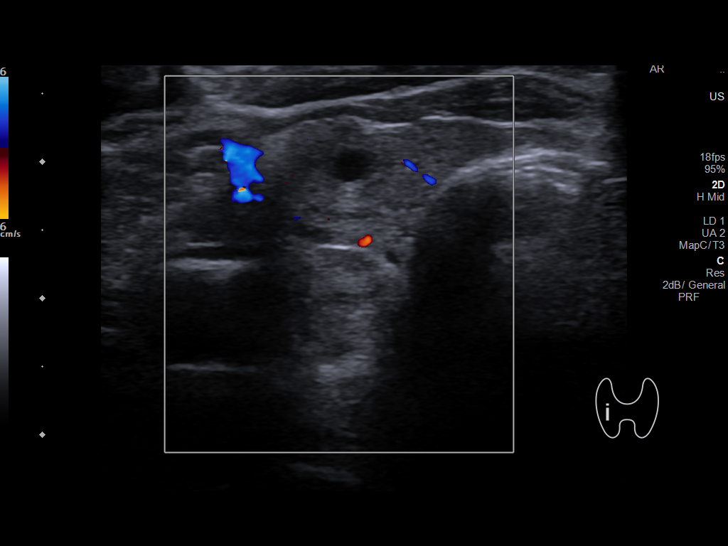
[im 15/57]
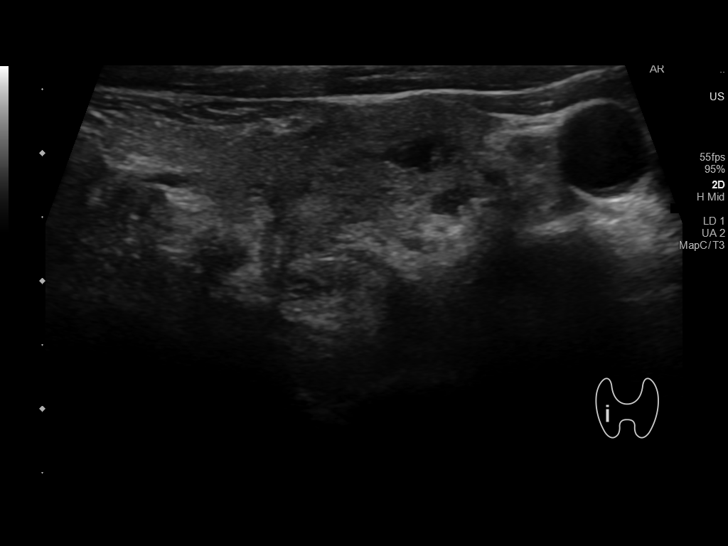
[im 19/57]
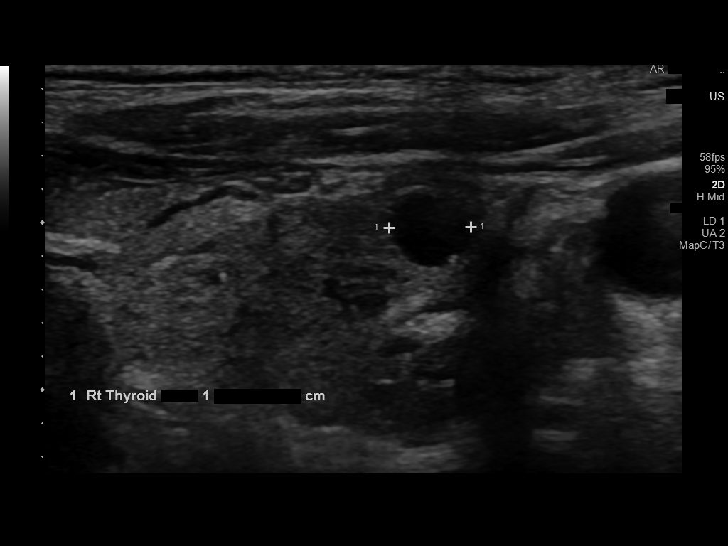
[im 22/57]
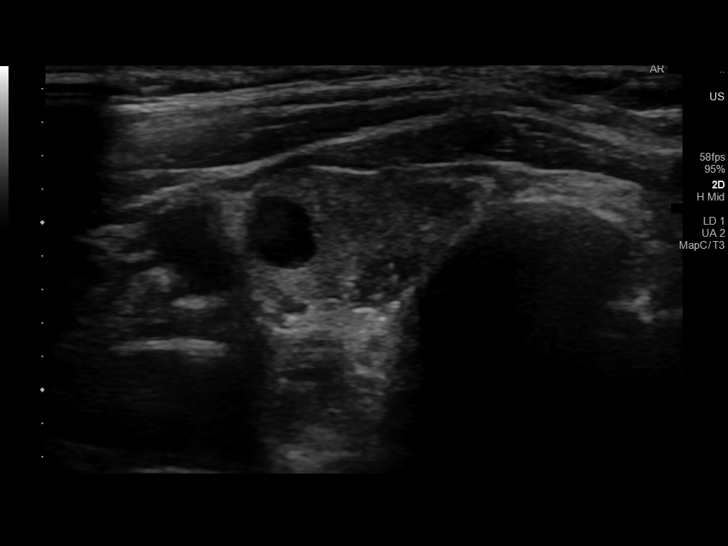
[im 26/57]
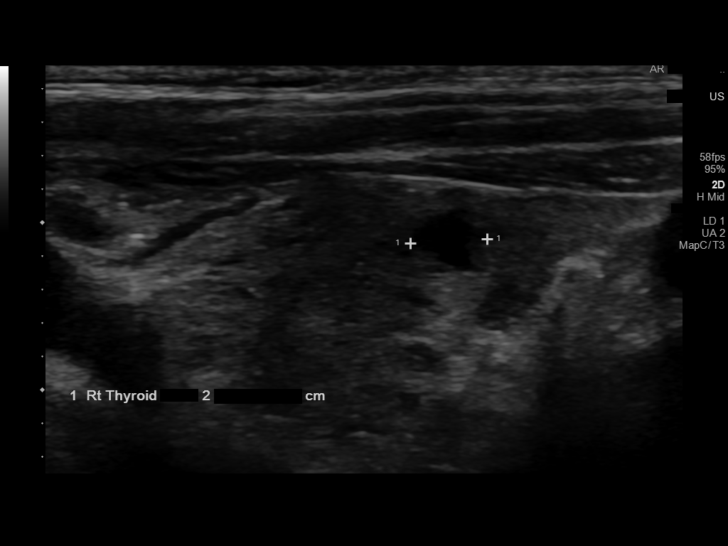
[im 31/57]
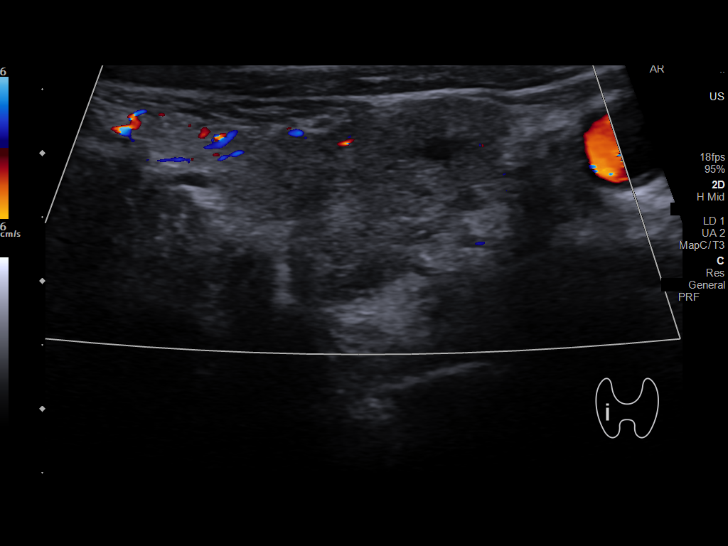
[im 36/57]
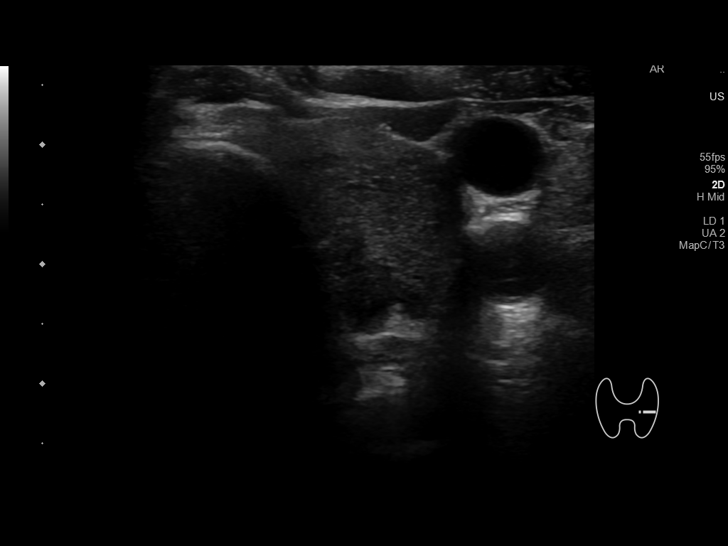
[im 38/57]
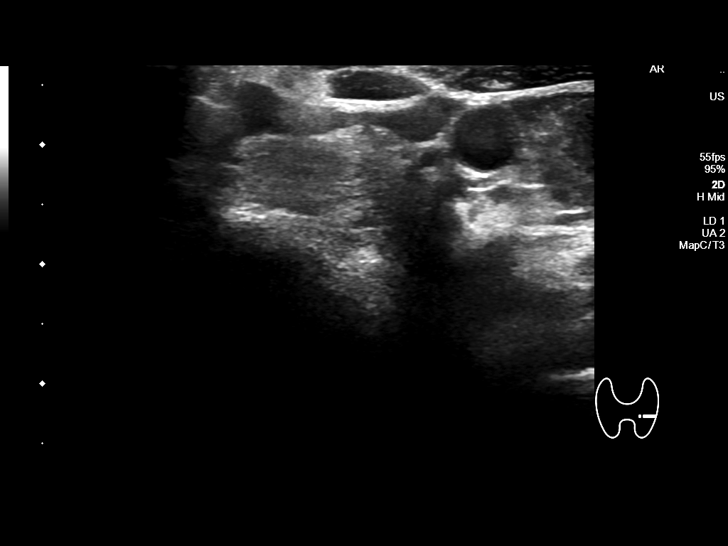
[im 43/57]
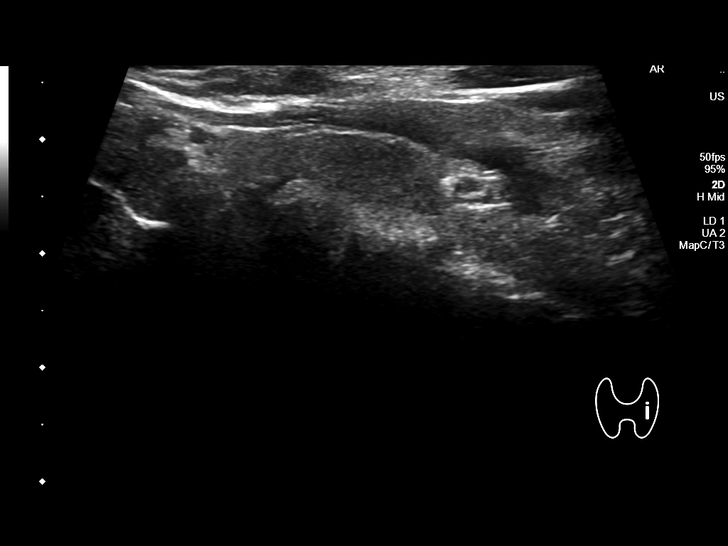
[im 47/57]
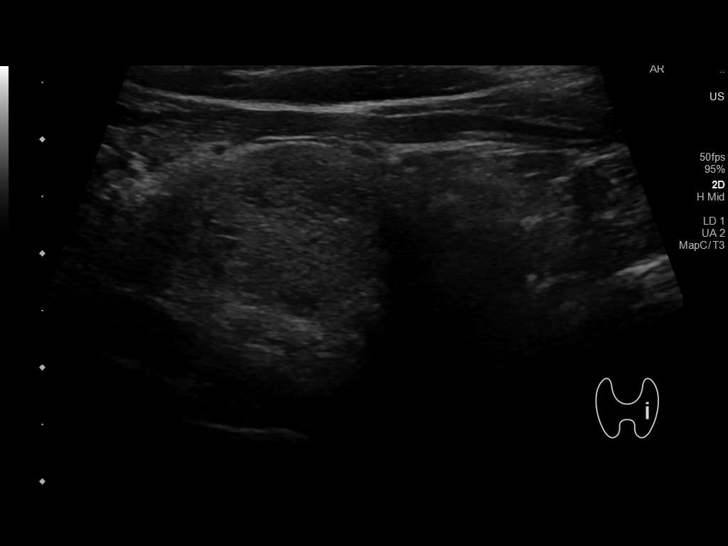
[im 52/57]
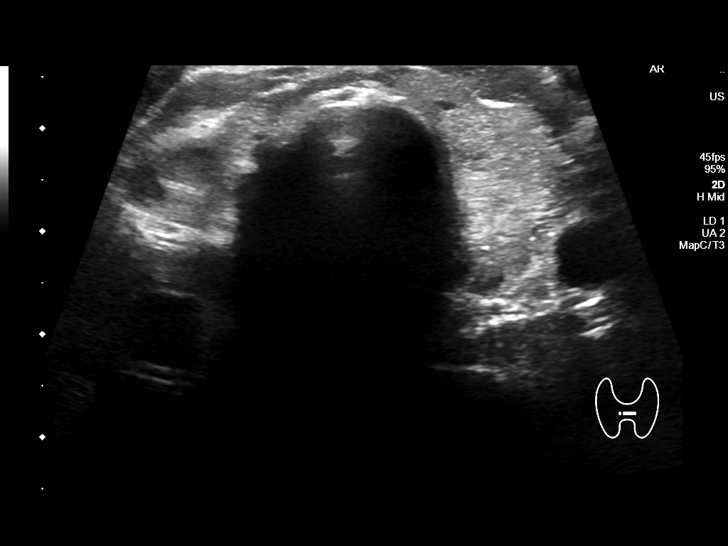
[im 57/57]
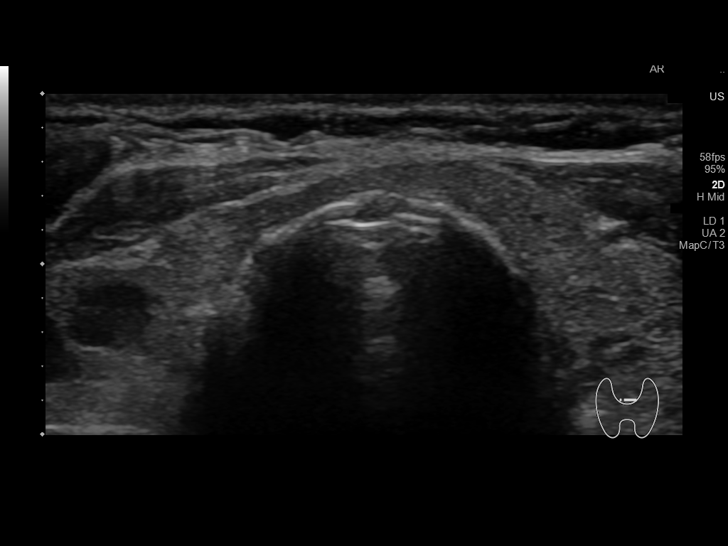

[14 of 25 positions shown; findings below may reference images not displayed]

FINDINGS: Parenchymal Echotexture: Moderately heterogenous

Isthmus: 0.2 cm thickness, previously

Right lobe: 3.3 x 1.8 x 1.5 cm, previously 4.7 x 1.9 x

Left lobe: 4.9 x 2.9 x 1.4 cm, previously 3.9 x 2 x

_________________________________________________________

Estimated total number of nodules >/= 1 cm: 0

Number of spongiform nodules >/=  2 cm not described below (TR1): 0

Number of mixed cystic and solid nodules >/= 1.5 cm not described
below (TR2): 0

_________________________________________________________

0.5 cm benign colloid cyst, mid right, stable. 0.58 cm benign
colloid cyst, inferior right, previously 0.32 cm. These do NOT meet
TI-RADS criteria for biopsy or dedicated follow-up.
IMPRESSION: 1. Normal-sized thyroid with stable small right colloid cysts. No
indication for biopsy or dedicated imaging follow-up.

The above is in keeping with the ACR TI-RADS recommendations - [HOSPITAL] 0728;[DATE].

## 2020-01-12 DIAGNOSIS — M5416 Radiculopathy, lumbar region: Secondary | ICD-10-CM | POA: Diagnosis not present

## 2020-01-12 DIAGNOSIS — M8000XD Age-related osteoporosis with current pathological fracture, unspecified site, subsequent encounter for fracture with routine healing: Secondary | ICD-10-CM | POA: Diagnosis not present

## 2020-01-12 DIAGNOSIS — F419 Anxiety disorder, unspecified: Secondary | ICD-10-CM | POA: Diagnosis not present

## 2020-01-12 DIAGNOSIS — M130 Polyarthritis, unspecified: Secondary | ICD-10-CM | POA: Diagnosis not present

## 2020-01-12 DIAGNOSIS — M533 Sacrococcygeal disorders, not elsewhere classified: Secondary | ICD-10-CM | POA: Diagnosis not present

## 2020-01-12 DIAGNOSIS — M79604 Pain in right leg: Secondary | ICD-10-CM | POA: Diagnosis not present

## 2020-01-12 DIAGNOSIS — M25551 Pain in right hip: Secondary | ICD-10-CM | POA: Diagnosis not present

## 2020-01-12 DIAGNOSIS — M545 Low back pain: Secondary | ICD-10-CM | POA: Diagnosis not present

## 2020-01-13 ENCOUNTER — Ambulatory Visit (INDEPENDENT_AMBULATORY_CARE_PROVIDER_SITE_OTHER): Payer: Medicare Other | Admitting: Family Medicine

## 2020-01-13 ENCOUNTER — Encounter: Payer: Self-pay | Admitting: Family Medicine

## 2020-01-13 VITALS — BP 137/63 | Ht 62.0 in | Wt 96.0 lb

## 2020-01-13 DIAGNOSIS — M25551 Pain in right hip: Secondary | ICD-10-CM | POA: Diagnosis not present

## 2020-01-13 DIAGNOSIS — E43 Unspecified severe protein-calorie malnutrition: Secondary | ICD-10-CM | POA: Diagnosis not present

## 2020-01-13 DIAGNOSIS — G8929 Other chronic pain: Secondary | ICD-10-CM | POA: Diagnosis not present

## 2020-01-13 DIAGNOSIS — R2681 Unsteadiness on feet: Secondary | ICD-10-CM | POA: Diagnosis not present

## 2020-01-13 DIAGNOSIS — E785 Hyperlipidemia, unspecified: Secondary | ICD-10-CM
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
## 2020-01-13 NOTE — Assessment & Plan Note (Signed)
Very high risk, wheelchair for use outside the home.  Is not currently living in the home except for for transfer to bedside commode as needed.

## 2020-01-13 NOTE — Assessment & Plan Note (Signed)
Hyperlipidemia: Low-fat diet encouraged and discussed.  Needs to get updated labs.

## 2020-01-13 NOTE — Patient Instructions (Addendum)
I appreciate the opportunity to provide you with care for your health and wellness. Today we discussed: overall health    Follow up:  4 months   Please get labs fasting once you can.  Please continue to practice social distancing to keep you, your family, and our community safe.  If you must go out, please wear a mask and practice good handwashing.  It was a pleasure to see you and I look forward to continuing to work together on your health and well-being. Please do not hesitate to call the office if you need care or have questions about your care.  Have a wonderful day and week. With Gratitude, Cherly Beach, DNP, AGNP-BC
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
## 2020-01-13 NOTE — Assessment & Plan Note (Signed)
Ongoing severe protein calorie malnourishment.  Encouraged her to eat what she is willing to eat as she does have significant loss of weight.  But staying within the means of keeping her cholesterol somewhat controlled.

## 2020-02-11 ENCOUNTER — Other Ambulatory Visit: Payer: Self-pay | Admitting: *Deleted

## 2020-02-11 DIAGNOSIS — K219 Gastro-esophageal reflux disease without esophagitis: Secondary | ICD-10-CM

## 2020-02-11 MED ORDER — OMEPRAZOLE 20 MG PO CPDR: DELAYED_RELEASE_CAPSULE | ORAL | 3 refills | Status: DC

## 2020-03-10 ENCOUNTER — Telehealth: Payer: Medicare Other | Admitting: Physician Assistant

## 2020-03-16 ENCOUNTER — Telehealth: Payer: Self-pay

## 2020-03-16 NOTE — Telephone Encounter (Signed)
Please resend the lab work over

## 2020-03-17 ENCOUNTER — Other Ambulatory Visit: Payer: Self-pay

## 2020-03-17 DIAGNOSIS — I2510 Atherosclerotic heart disease of native coronary artery without angina pectoris: Secondary | ICD-10-CM

## 2020-03-17 DIAGNOSIS — E785 Hyperlipidemia, unspecified: Secondary | ICD-10-CM

## 2020-03-17 NOTE — Telephone Encounter (Signed)
Labs resent. 

## 2020-05-16 ENCOUNTER — Ambulatory Visit (INDEPENDENT_AMBULATORY_CARE_PROVIDER_SITE_OTHER): Payer: Medicare Other | Admitting: Family Medicine

## 2020-05-16 ENCOUNTER — Other Ambulatory Visit (HOSPITAL_COMMUNITY)
Admission: AD | Admit: 2020-05-16 | Discharge: 2020-05-16 | Disposition: A | Discharge status: Home / Self Care | Payer: Medicare Other | Source: Skilled Nursing Facility | Attending: Family Medicine | Admitting: Family Medicine

## 2020-05-16 ENCOUNTER — Other Ambulatory Visit: Payer: Self-pay

## 2020-05-16 ENCOUNTER — Encounter: Payer: Self-pay | Admitting: Family Medicine

## 2020-05-16 VITALS — BP 112/69 | HR 84 | Resp 16 | Ht 60.0 in | Wt 87.0 lb

## 2020-05-16 DIAGNOSIS — R636 Underweight: Secondary | ICD-10-CM | POA: Diagnosis not present

## 2020-05-16 DIAGNOSIS — M5431 Sciatica, right side: Secondary | ICD-10-CM | POA: Diagnosis not present

## 2020-05-16 DIAGNOSIS — E785 Hyperlipidemia, unspecified: Secondary | ICD-10-CM | POA: Diagnosis not present

## 2020-05-16 DIAGNOSIS — R300 Dysuria: Secondary | ICD-10-CM | POA: Diagnosis not present

## 2020-05-16 DIAGNOSIS — M79675 Pain in left toe(s): Secondary | ICD-10-CM

## 2020-05-16 DIAGNOSIS — R350 Frequency of micturition: Secondary | ICD-10-CM

## 2020-05-16 DIAGNOSIS — R319 Hematuria, unspecified: Secondary | ICD-10-CM | POA: Insufficient documentation

## 2020-05-16 DIAGNOSIS — K219 Gastro-esophageal reflux disease without esophagitis: Secondary | ICD-10-CM | POA: Diagnosis not present

## 2020-05-16 DIAGNOSIS — R7989 Other specified abnormal findings of blood chemistry: Secondary | ICD-10-CM | POA: Diagnosis not present

## 2020-05-16 DIAGNOSIS — E559 Vitamin D deficiency, unspecified: Secondary | ICD-10-CM | POA: Diagnosis not present

## 2020-05-16 DIAGNOSIS — I2510 Atherosclerotic heart disease of native coronary artery without angina pectoris: Secondary | ICD-10-CM

## 2020-05-16 DIAGNOSIS — F419 Anxiety disorder, unspecified: Secondary | ICD-10-CM | POA: Diagnosis not present

## 2020-05-16 LAB — POCT URINALYSIS DIP (CLINITEK)
Bilirubin, UA: NEGATIVE
Glucose, UA: NEGATIVE mg/dL
Ketones, POC UA: NEGATIVE mg/dL
Nitrite, UA: NEGATIVE
POC PROTEIN,UA: NEGATIVE
Spec Grav, UA: 1.015 (ref 1.010–1.025)
Urobilinogen, UA: 0.2 E.U./dL
pH, UA: 6.5 (ref 5.0–8.0)

## 2020-05-16 NOTE — Progress Notes (Signed)
   Kara [name]     MRN: [id number]      DOB: [birth year]   HPI Kara [name] is here for follow up and re-evaluation of chronic medical conditions, medication management and review of any available recent lab and radiology data.  Preventive health is updated, specifically  d Immunization.   Followed by Neurology for pain management  Daughters report increased difficulty getting her out of her home and are considering Provider change to one who will visit, which in the home.  C/o urinary frequency and mild odor, also has wetting incidents , particularly at bedtime C/o poor appetitie and weight loss, will try to get ensure 3/ day C/o left great toenail pain with redness for weeks , Podiatry to eval C/o tenderness and swelling of neck will have eNT re eval ROS Denies recent fever or chills. Denies sinus pressure, nasal congestion, ear pain  Denies chest congestion, productive cough or wheezing. Denies chest pains, palpitations and leg swelling Denies depression, anxiety or insomnia. Denies skin break down or rash.   PE  BP 112/69   Pulse 84   Resp 16   Ht 5' (1.524 m)   Wt (!) 87 lb 0.6 oz (39.5 kg)   SpO2 98%   BMI 17.00 kg/m   Patient alert and oriented and in no cardiopulmonary distress.Underweight   HEENT: No facial asymmetry, EOMI,     Neck decreased ROM, no palpable mass, no adenopathy.  Chest: Clear to auscultation bilaterally.  CVS: S1, S2 no murmurs, no S3.Regular rate.  ABD: Soft non tender.   Ext: No edema  MS: Decreased  ROM spine, shoulders, hips and knees.  Skin: Intact, no ulcerations or rash noted.  Psych: Good eye contact, normal affect. Memory intact not anxious or depressed appearing.  CNS: CN 2-12 intact, power,  normal throughout.no focal deficits noted.   Assessment & Plan  Underweight due to inadequate caloric intake Progressive weight loss, pt on chronic opioids and has GERD also, refer gI for eval and assistance with management Order 3  cans ensure/ day , and pt to eat meals as well, re eval in 3 to 4 months  Anxiety managed with bedtime klonopin  Urinary frequency Evaluate for uTI, and also send for incontinence supply  Sciatica of right side Chronic pain management through Neurology  GERD (gastroesophageal reflux disease) Gastro to re veal, pt has poor apetite and weight loss  Pain around toenail, left foot Left great toe pain at nail base with mild erythema. Refer podiatry

## 2020-05-16 NOTE — Patient Instructions (Signed)
F/U in office ( if able) weight re check in 4 months, will also be able to do video visit if not able to come in, call if you need me sooner  Call your insurance re availability of  Provider  who will do home visits, and fine to pursue this option as Kara [name]'s mobility is becoming more challenging  We will try to get Ensure for her 3 cans/ day, nurse to discuss with you, also diapers    Will call with blood work  Dr Gala Romney to re velaute abdominal pain and weight loss  She is referred to ENT, [place] [facility] if any, or [place] re neck pain and swelling   She is referred to Podiatry re left great toenail pain  Holding on antibiotic until urine culture is back  Careful not to fall, and stay safe  Thanks for choosing [facility], we consider it a privelige to serve you.    ,

## 2020-05-17 ENCOUNTER — Telehealth: Payer: Self-pay | Admitting: Family Medicine

## 2020-05-17 NOTE — Telephone Encounter (Signed)
Please call and let pt know her kidney functioin and blood sugar are excellent. Cholesterol has improved since last checked, stil a bit high but overall improved. Blood count is stable and unchanged,  Has mild anemia, white cell count and platelets are normal  No changes need to be made  In medication. Increase vegetable and fruit and try to reduce fried and fatty foods just a little bit more

## 2020-05-17 NOTE — Telephone Encounter (Signed)
Left generic message to call back to discuss recent lab results. ° °

## 2020-05-18 ENCOUNTER — Telehealth: Payer: Self-pay | Admitting: Family Medicine

## 2020-05-18 ENCOUNTER — Other Ambulatory Visit: Payer: Self-pay

## 2020-05-18 LAB — URINE CULTURE

## 2020-05-18 MED ORDER — UNABLE TO FIND: 2 refills | Status: DC

## 2020-05-18 NOTE — Telephone Encounter (Signed)
Daugther was made aware of results

## 2020-05-18 NOTE — Telephone Encounter (Signed)
Daughter calling for lab results

## 2020-05-21 ENCOUNTER — Encounter: Payer: Self-pay | Admitting: Family Medicine

## 2020-05-21 ENCOUNTER — Telehealth: Payer: Self-pay | Admitting: Family Medicine

## 2020-05-21 ENCOUNTER — Other Ambulatory Visit: Payer: Self-pay | Admitting: Family Medicine

## 2020-05-21 DIAGNOSIS — M79675 Pain in left toe(s): Secondary | ICD-10-CM | POA: Insufficient documentation

## 2020-05-21 MED ORDER — LEVOTHYROXINE SODIUM 25 MCG PO TABS: 25.0000 ug | ORAL_TABLET | Freq: Every day | ORAL | 3 refills | Status: DC

## 2020-05-21 NOTE — Assessment & Plan Note (Signed)
Gastro to re veal, pt has poor apetite and weight loss

## 2020-05-21 NOTE — Assessment & Plan Note (Signed)
Progressive weight loss, pt on chronic opioids and has GERD also, refer gI for eval and assistance with management Order 3 cans ensure/ day , and pt to eat meals as well, re eval in 3 to 4 months

## 2020-05-21 NOTE — Assessment & Plan Note (Signed)
Left great toe pain at nail base with mild erythema. Refer podiatry

## 2020-05-21 NOTE — Assessment & Plan Note (Signed)
Chronic pain management through Neurology

## 2020-05-21 NOTE — Progress Notes (Signed)
tsh

## 2020-05-21 NOTE — Assessment & Plan Note (Signed)
managed with bedtime klonopin

## 2020-05-21 NOTE — Telephone Encounter (Signed)
Please call daughter and discussed with her the fact that her mom is referred to GI re weight loss as well as to podiatry be due to toe pain.    I had mentioned having ENT see her as well but on reviewing the note I am not exactly clear as to what the concern is that makes it necessary to refer her  ear nose and throat.  Please discuss with her daughter and let me know what the concern is so that I can refer her to ENT  thank you

## 2020-05-21 NOTE — Assessment & Plan Note (Signed)
Evaluate for uTI, and also send for incontinence supply

## 2020-05-23 ENCOUNTER — Other Ambulatory Visit: Payer: Self-pay | Admitting: Family Medicine

## 2020-05-23 ENCOUNTER — Encounter: Payer: Self-pay | Admitting: Internal Medicine

## 2020-05-23 ENCOUNTER — Other Ambulatory Visit: Payer: Self-pay | Admitting: *Deleted

## 2020-05-23 DIAGNOSIS — J358 Other chronic diseases of tonsils and adenoids: Secondary | ICD-10-CM

## 2020-05-23 DIAGNOSIS — R070 Pain in throat: Secondary | ICD-10-CM

## 2020-05-23 DIAGNOSIS — R946 Abnormal results of thyroid function studies: Secondary | ICD-10-CM

## 2020-05-23 NOTE — Telephone Encounter (Signed)
Advised referral to ENT placed

## 2020-05-23 NOTE — Telephone Encounter (Signed)
Call caregiver to give results. No answer. Left generic message.

## 2020-05-23 NOTE — Telephone Encounter (Signed)
Her mom did c/o throat pain  during the visit , so I weill go ahead and refer, appt  [place] be in [place], thanks

## 2020-05-23 NOTE — Telephone Encounter (Signed)
Spoke with Kara [name] pt complains of one side of her throat hurting and she hasnt heard her complain that much about it but previously pt had a tonsil stone so she didn't know if you would like to refer her or not to ENT

## 2020-05-23 NOTE — Telephone Encounter (Signed)
Advised pt daughter [name] of lab results with verbal understanding

## 2020-05-24 ENCOUNTER — Other Ambulatory Visit: Payer: Self-pay | Admitting: *Deleted

## 2020-05-24 DIAGNOSIS — I10 Essential (primary) hypertension: Secondary | ICD-10-CM

## 2020-05-24 LAB — TEST AUTHORIZATION 3

## 2020-05-24 LAB — BASIC METABOLIC PANEL WITH GFR
BUN: 7 mg/dL (ref 7–25)
CO2: 29 mmol/L (ref 20–32)
Calcium: 9.3 mg/dL (ref 8.6–10.4)
Chloride: 102 mmol/L (ref 98–110)
Creat: 0.67 mg/dL (ref 0.60–0.88)
GFR, Est African American: 90 mL/min/{1.73_m2} (ref >=60)
GFR, Est Non African American: 77 mL/min/{1.73_m2} (ref >=60)
Glucose, Bld: 94 mg/dL (ref 65–99)
Potassium: 4.1 mmol/L (ref 3.5–5.3)
Sodium: 140 mmol/L (ref 135–146)

## 2020-05-24 LAB — CBC
HCT: 31.9 % — ABNORMAL LOW (ref 35.0–45.0)
Hemoglobin: 10.8 g/dL — ABNORMAL LOW (ref 11.7–15.5)
MCH: 32.6 pg (ref 27.0–33.0)
MCHC: 33.9 g/dL (ref 32.0–36.0)
MCV: 96.4 fL (ref 80.0–100.0)
MPV: 10.2 fL (ref 7.5–12.5)
Platelets: 201 10*3/uL (ref 140–400)
RBC: 3.31 10*6/uL — ABNORMAL LOW (ref 3.80–5.10)
RDW: 16.5 % — ABNORMAL HIGH (ref 11.0–15.0)
WBC: 6.4 10*3/uL (ref 3.8–10.8)

## 2020-05-24 LAB — T4: T4, Total: 7.2 ug/dL (ref 5.1–11.9)

## 2020-05-24 LAB — LIPID PANEL
Cholesterol: 204 mg/dL — ABNORMAL HIGH (ref <200)
HDL: 64 mg/dL (ref >=50)
LDL Cholesterol (Calc): 111 mg/dL (calc) — ABNORMAL HIGH
Non-HDL Cholesterol (Calc): 140 mg/dL (calc) — ABNORMAL HIGH (ref <130)
Total CHOL/HDL Ratio: 3.2 (calc) (ref <5.0)
Triglycerides: 175 mg/dL — ABNORMAL HIGH (ref <150)

## 2020-05-24 LAB — TEST AUTHORIZATION 2

## 2020-05-24 LAB — TEST AUTHORIZATION

## 2020-05-24 LAB — TSH: TSH: 7.21 mIU/L — ABNORMAL HIGH (ref 0.40–4.50)

## 2020-05-24 LAB — T3: T3, Total: 149 ng/dL (ref 76–181)

## 2020-05-24 LAB — VITAMIN D 25 HYDROXY (VIT D DEFICIENCY, FRACTURES): Vit D, 25-Hydroxy: 39 ng/mL (ref 30–100)

## 2020-05-30 ENCOUNTER — Telehealth: Payer: Self-pay

## 2020-05-30 NOTE — Telephone Encounter (Signed)
Daughter([facility])  is requesting a copy of pt's most recent lab work to mailed to the house so that they cane have it on file at home

## 2020-05-30 NOTE — Telephone Encounter (Signed)
Labs sent to house per pt daughter request

## 2020-06-13 ENCOUNTER — Ambulatory Visit: Payer: Medicare Other | Admitting: Family Medicine

## 2020-07-07 ENCOUNTER — Telehealth: Payer: Self-pay | Admitting: Family Medicine

## 2020-07-07 NOTE — Telephone Encounter (Signed)
Patient sister [name] called and would like a call back from the nurse to see if patient thyroid medicine can cause lower back pain. [phone]

## 2020-07-07 NOTE — Telephone Encounter (Signed)
I recommend rept tSh and eval by Endo to see if best for her to stop, if she agrees refer to Dr Dorris Fetch for evaluation and management of abn tSH pls

## 2020-07-07 NOTE — Telephone Encounter (Signed)
Patient is wanting to stop the thyroid medication because she has been hurting a lot since starting it and the pt thinks its because of the med. She is wanting to stop. Please advise.

## 2020-07-08 ENCOUNTER — Other Ambulatory Visit: Payer: Self-pay

## 2020-07-08 DIAGNOSIS — R946 Abnormal results of thyroid function studies: Secondary | ICD-10-CM

## 2020-07-08 NOTE — Telephone Encounter (Signed)
Referral entered  

## 2020-07-11 ENCOUNTER — Telehealth: Payer: Self-pay

## 2020-07-11 NOTE — Telephone Encounter (Signed)
Spoke with patient's daughter who states the pt is down with her back right now. She said she will call back at a later time

## 2020-07-11 NOTE — Telephone Encounter (Signed)
Yes

## 2020-07-11 NOTE — Telephone Encounter (Signed)
Dr Dorris Fetch Can this pt see Kara [name] for abnormal thyroid function?

## 2020-07-14 ENCOUNTER — Telehealth: Payer: Self-pay

## 2020-07-14 DIAGNOSIS — M545 Low back pain: Secondary | ICD-10-CM | POA: Diagnosis not present

## 2020-07-14 DIAGNOSIS — M130 Polyarthritis, unspecified: Secondary | ICD-10-CM | POA: Diagnosis not present

## 2020-07-14 DIAGNOSIS — M8000XD Age-related osteoporosis with current pathological fracture, unspecified site, subsequent encounter for fracture with routine healing: Secondary | ICD-10-CM | POA: Diagnosis not present

## 2020-07-14 DIAGNOSIS — M5416 Radiculopathy, lumbar region: Secondary | ICD-10-CM | POA: Diagnosis not present

## 2020-07-14 DIAGNOSIS — M79604 Pain in right leg: Secondary | ICD-10-CM | POA: Diagnosis not present

## 2020-07-14 DIAGNOSIS — M25551 Pain in right hip: Secondary | ICD-10-CM | POA: Diagnosis not present

## 2020-07-14 DIAGNOSIS — F419 Anxiety disorder, unspecified: Secondary | ICD-10-CM | POA: Diagnosis not present

## 2020-07-14 DIAGNOSIS — M533 Sacrococcygeal disorders, not elsewhere classified: Secondary | ICD-10-CM | POA: Diagnosis not present

## 2020-07-14 NOTE — Telephone Encounter (Signed)
Daughter requesting home health nurse- needs a video appt scheduled with dr Moshe Cipro. (daughter just needs to be able to receive text messages on her phone and we will send her a link by text to connect to the visit. This must be done before home health will accept patient. Thanks

## 2020-07-14 NOTE — Telephone Encounter (Signed)
Phone visit scheduled 07/26/20 @ 3:20

## 2020-07-19 ENCOUNTER — Ambulatory Visit: Payer: Medicare Other | Admitting: Gastroenterology

## 2020-07-26 ENCOUNTER — Telehealth (INDEPENDENT_AMBULATORY_CARE_PROVIDER_SITE_OTHER): Payer: Medicare Other | Admitting: Family Medicine

## 2020-07-26 ENCOUNTER — Other Ambulatory Visit: Payer: Self-pay

## 2020-07-26 VITALS — BP 119/62 | HR 72 | Ht 60.0 in | Wt 106.0 lb

## 2020-07-26 DIAGNOSIS — R8290 Unspecified abnormal findings in urine: Secondary | ICD-10-CM | POA: Diagnosis not present

## 2020-07-26 DIAGNOSIS — I2510 Atherosclerotic heart disease of native coronary artery without angina pectoris: Secondary | ICD-10-CM

## 2020-07-26 DIAGNOSIS — R946 Abnormal results of thyroid function studies: Secondary | ICD-10-CM

## 2020-07-26 DIAGNOSIS — K5790 Diverticulosis of intestine, part unspecified, without perforation or abscess without bleeding: Secondary | ICD-10-CM

## 2020-07-26 DIAGNOSIS — M159 Polyosteoarthritis, unspecified: Secondary | ICD-10-CM | POA: Diagnosis not present

## 2020-07-26 DIAGNOSIS — R636 Underweight: Secondary | ICD-10-CM

## 2020-07-26 DIAGNOSIS — K219 Gastro-esophageal reflux disease without esophagitis: Secondary | ICD-10-CM | POA: Diagnosis not present

## 2020-07-26 NOTE — Progress Notes (Signed)
Virtual Visit via Telephone Note, video attempte butunsuccessful with connection  I connected with [place] on 04/54/09 at  3:20 PM EDT by telephone and verified that I am speaking with the correct person using two identifiers.Video visit attempted , however , unable to make connection . Call put through via telephone  Location: Patient: home where she lives witha one of her daughter's [name], however interview is with another daughter [name] who helps with her care Provider:Office e   I discussed the limitations, risks, security and privacy concerns of performing an evaluation and management service by telephone and the availability of in person appointments. I also discussed with the patient that there may be a patient responsible charge related to this service. The patient expressed understanding and agreed to proceed.   History of Present Illness: States she has been having a lot of problem since started thyroid pills,took it for about 4 weeks, then stopped.thought was contributing to her back pain  thought the back pain across her pelvic area to both  legs and this has not changed since stopping the thyroid medication.Pain disturbed her sleep last night , woke in pain after 3 hrs all night States urine noted to have strong odor , and has got slight relief from azo.x 4 days, no fever or chills  Requesting nursing visit , in home health care. Recently when stooping over , got sharp pain in her back in the past 2 weeks Needs hat and cup for urine Not walking , standing up turns around and goes to the potty Back pain un relenting, cannot get around without assistance barely moves much in her home, and leaving home poses both a  Safety risk , and is increasingly prohibitively painful C/o malodrous urin and fatigue noted in the past 5 to 7 days, no fevr, chills or flank pain    Observations/Objective:l hernia BP 119/62   Pulse 72   Ht 5' (1.524 m)   Wt 106 lb (48.1 kg)   BMI 20.70  kg/m  Fairly Good communication Alert  No signs of respiratory distress during speech    Assessment and Plan: Generalized osteoarthritis Worsening generalized jpoint pain and stiffness, limiting movement both inside and outside of the home. Needs assistance with ambulation in the home, even though she has a walker, seldom moving more than from bed to bedside commode. totslly home bound and seeking andneeds in home health care and assessments by nursing or Provider [place] requests helkp with arranging this and I will reach out to see how necessart health services can become centered at home  Underweight due to inadequate caloric intake Encouraged [place] to eat small amounts frequently to help with weight gain  Abnormal thyroid function test Advise pt to resume supplement as tSH c/w hypothyroidism, needs updated lab  GERD (gastroesophageal reflux disease) Maintained on daily PPI, has large hiatal hernia  Diverticulosis No h/o abdominal pain or rectal blood in recent past    Follow Up Instructions:    I discussed the assessment and treatment plan with the patient. The patient was provided an opportunity to ask questions and all were answered. The patient agreed with the plan and demonstrated an understanding of the instructions.   The patient was advised to call back or seek an in-person evaluation if the symptoms worsen or if the condition fails to improve as anticipated.  I provided 22 minutes of non-face-to-face time during this encounter.   [name], MD

## 2020-07-26 NOTE — Patient Instructions (Addendum)
I will work on getting in home nursing/ health evaluations as we discussed and again try very brief video call another day  Please submit urine for c/s , hat and container to be collected  As far as the thyroid medication , I do not think that this is causing stomach bloating and recommend that you resume once more.  Labs are outstanding and need to be done   Sorry that your back hurts to the extent that you are unable to get around in your home or safely leave home

## 2020-07-31 ENCOUNTER — Encounter: Payer: Self-pay | Admitting: Family Medicine

## 2020-07-31 DIAGNOSIS — R8290 Unspecified abnormal findings in urine: Secondary | ICD-10-CM | POA: Insufficient documentation

## 2020-07-31 DIAGNOSIS — M159 Polyosteoarthritis, unspecified: Secondary | ICD-10-CM | POA: Insufficient documentation

## 2020-07-31 NOTE — Assessment & Plan Note (Signed)
Worsening generalized jpoint pain and stiffness, limiting movement both inside and outside of the home. Needs assistance with ambulation in the home, even though she has a walker, seldom moving more than from bed to bedside commode. totslly home bound and seeking andneeds in home health care and assessments by nursing or Provider [place] requests helkp with arranging this and I will reach out to see how necessart health services can become centered at home

## 2020-07-31 NOTE — Assessment & Plan Note (Signed)
Daughter to collect ht and container for urine to be submitted for testing for UTI. In the absence of fever, no antibiotic is prescribed

## 2020-07-31 NOTE — Assessment & Plan Note (Signed)
Advise pt to resume supplement as [place] c/w hypothyroidism, needs updated lab

## 2020-07-31 NOTE — Assessment & Plan Note (Signed)
No h/o abdominal pain or rectal blood in recent past

## 2020-07-31 NOTE — Assessment & Plan Note (Signed)
Maintained on daily PPI, has large hiatal hernia

## 2020-07-31 NOTE — Assessment & Plan Note (Signed)
Encouraged Rose[place] to eat small amounts frequently to help with weight gain

## 2020-08-01 NOTE — Telephone Encounter (Signed)
Pt daughter [name] calling to see why the home health nurse was never sent out as discussed her mom is too weak to come in the office and they wanted blood work and urine drawn. Please advise

## 2020-08-09 ENCOUNTER — Other Ambulatory Visit: Payer: Self-pay

## 2020-08-09 DIAGNOSIS — R350 Frequency of micturition: Secondary | ICD-10-CM

## 2020-08-09 DIAGNOSIS — R8290 Unspecified abnormal findings in urine: Secondary | ICD-10-CM

## 2020-08-09 DIAGNOSIS — E43 Unspecified severe protein-calorie malnutrition: Secondary | ICD-10-CM

## 2020-08-09 DIAGNOSIS — R636 Underweight: Secondary | ICD-10-CM

## 2020-08-09 DIAGNOSIS — G3184 Mild cognitive impairment, so stated: Secondary | ICD-10-CM

## 2020-08-09 DIAGNOSIS — R2681 Unsteadiness on feet: Secondary | ICD-10-CM

## 2020-08-09 DIAGNOSIS — M25551 Pain in right hip: Secondary | ICD-10-CM

## 2020-08-09 DIAGNOSIS — G8929 Other chronic pain: Secondary | ICD-10-CM

## 2020-08-09 NOTE — Telephone Encounter (Signed)
Spoke with [name]. She will pick up urine supplies to collect sample at home and return sample back to office to send out for culture. HH referral placed

## 2020-08-11 ENCOUNTER — Other Ambulatory Visit (HOSPITAL_COMMUNITY)
Admission: RE | Admit: 2020-08-11 | Discharge: 2020-08-11 | Disposition: A | Discharge status: Home / Self Care | Payer: Medicare Other | Source: Ambulatory Visit | Attending: Family Medicine | Admitting: Family Medicine

## 2020-08-11 DIAGNOSIS — R350 Frequency of micturition: Secondary | ICD-10-CM | POA: Insufficient documentation

## 2020-08-11 DIAGNOSIS — R8290 Unspecified abnormal findings in urine: Secondary | ICD-10-CM | POA: Diagnosis not present

## 2020-08-13 LAB — URINE CULTURE: Culture: <10000 — AB

## 2020-08-15 ENCOUNTER — Telehealth: Payer: Self-pay | Admitting: Family Medicine

## 2020-08-15 NOTE — Telephone Encounter (Signed)
Best to arrange an in office visit, so we do not have another failed video visit, best time is a 1 pm add on, son as possible, should be short. If they rather try another very brief video visit I am open to that also

## 2020-08-16 NOTE — Telephone Encounter (Signed)
See Dr Camillia Herter message (wanting home health but they require a video or in person visit) last attempt at video visit failed so was unable to set up home health.

## 2020-08-17 ENCOUNTER — Telehealth: Payer: Self-pay

## 2020-08-17 NOTE — Telephone Encounter (Signed)
Patients daughter called to see if they can have an in home colon test. Please advise.

## 2020-08-17 NOTE — Telephone Encounter (Signed)
Not recommended at age 86, and I do not believe insurance will cover the test, please let her know.  If they have concerns about bowel movement  or abdominal pain etc, I recommend gI evaluation and take it from there.  Please refer if this is what the want to GI doc of choice, thanks

## 2020-08-18 NOTE — Telephone Encounter (Signed)
Left generic message for daughter asking for return call.

## 2020-08-18 NOTE — Telephone Encounter (Signed)
Tried calling Kara [name] back several times in a row. Number would never call out. Will try again later.

## 2020-08-18 NOTE — Telephone Encounter (Signed)
Daughter [name] aware of recommendations. Asked about HH. Advised her that I had found someone to accept her, however she would have to have a video visit completed or come into the office for an in person visit. She will discuss it with the rest of her sisters and call us back.

## 2020-08-19 NOTE — Telephone Encounter (Signed)
LVM at [phone] to call the office

## 2020-08-25 ENCOUNTER — Telehealth (INDEPENDENT_AMBULATORY_CARE_PROVIDER_SITE_OTHER): Payer: Medicare Other | Admitting: Family Medicine

## 2020-08-25 ENCOUNTER — Other Ambulatory Visit: Payer: Self-pay

## 2020-08-25 ENCOUNTER — Encounter: Payer: Self-pay | Admitting: Family Medicine

## 2020-08-25 VITALS — BP 102/64

## 2020-08-25 DIAGNOSIS — R2681 Unsteadiness on feet: Secondary | ICD-10-CM

## 2020-08-25 DIAGNOSIS — Z742 Need for assistance at home and no other household member able to render care: Secondary | ICD-10-CM | POA: Diagnosis not present

## 2020-08-25 DIAGNOSIS — M159 Polyosteoarthritis, unspecified: Secondary | ICD-10-CM | POA: Diagnosis not present

## 2020-08-25 DIAGNOSIS — N39498 Other specified urinary incontinence: Secondary | ICD-10-CM | POA: Diagnosis not present

## 2020-08-25 DIAGNOSIS — I2510 Atherosclerotic heart disease of native coronary artery without angina pectoris: Secondary | ICD-10-CM

## 2020-08-25 NOTE — Progress Notes (Signed)
Patient needs a face o face visit and video contact was made at 16:45 , pt is bed bound, all movement is painful, needs in home help Virtual Visit via video Note  I connected with Kara [name] on [birth year] at  2:20 PM EDT by telephone and verified that I am speaking with the correct person using two identifiers. I later connected with her by video at 16:45 Location: Patient: home Provider: home office    I discussed the limitations, risks, security and privacy concerns of performing an evaluation and management service by video and the availability of in person appointments. I also discussed with the patient that there may be a patient responsible charge related to this service. The patient expressed understanding and agreed to proceed.   History of Present Illness: Video face to fae visit with patient Kara [name], daughter present at visit, reason for visit is to establish the fact the she is bed confined, with poor appetite, generalized weakness, and requiring assistance for ADL's due her condition. Home  Health  All movement is painful for [name], She  had been moving from bed to commode, however increasingly painful and difficult and there  Is now need for incontinence supplies for  24/7 use, chucks, pads, pullups   Observations/Objective:  Frail elderly female,bitemporal wasting lying in bed. :Limited communication, due to reduced hearing and quality of video communication in no CP distress.  History from daughter as above No report of cough, fever or chills C/o poor appetite C/o chronic uncontrolled pain with all movement C/o increase weakness and reduced mobility No report of skin breakdown Assessment and Plan: Generalized osteoarthritis Chronic pain with increasing limitation in mobility, incapable of independent movement , and all movement aggravates her underlying pain Home health assistance needed , request sent  Unsteady gait High fall risk with increased weakness   And limitation in movmemnt, needs assistance getting out of bed and with all activity  Incontinence Markedly limited mobility resulting in Incontinence of urine needs supplies, chucks, pull ups  Need for home health care Need for home health care re established during this video visit as patient is bed confined and all movent both in bed and getting out of bed is extremely painful for her, requires in home health care    Follow Up Instructions:    I discussed the assessment and treatment plan with the patient. The patient was provided an opportunity to ask questions and all were answered. The patient agreed with the plan and demonstrated an understanding of the instructions.   The patient was advised to call back or seek an in-person evaluation if the symptoms worsen or if the condition fails to improve as anticipated.  I provided 10 minutes of non-face-to-face time during this encounter.   [name], MD

## 2020-08-25 NOTE — Patient Instructions (Signed)
Now that I have been able to complete a face to face video visit, the needed paperwork to justify in home help that you really do need will be completed.  We are here to help as much as we are able to  With your care and comfort

## 2020-08-27 ENCOUNTER — Encounter: Payer: Self-pay | Admitting: Family Medicine

## 2020-08-27 DIAGNOSIS — R32 Unspecified urinary incontinence: Secondary | ICD-10-CM | POA: Insufficient documentation

## 2020-08-27 DIAGNOSIS — Z742 Need for assistance at home and no other household member able to render care: Secondary | ICD-10-CM | POA: Insufficient documentation

## 2020-08-27 NOTE — Assessment & Plan Note (Signed)
High fall risk with increased weakness  And limitation in movmemnt, needs assistance getting out of bed and with all activity

## 2020-08-27 NOTE — Assessment & Plan Note (Signed)
Chronic pain with increasing limitation in mobility, incapable of independent movement , and all movement aggravates her underlying pain Home health assistance needed , request sent

## 2020-08-27 NOTE — Assessment & Plan Note (Signed)
Markedly limited mobility resulting in Incontinence of urine needs supplies, chucks, pull ups

## 2020-08-27 NOTE — Assessment & Plan Note (Signed)
Need for home health care re established during this video visit as patient is bed confined and all movent both in bed and getting out of bed is extremely painful for her, requires in home health care

## 2020-09-20 ENCOUNTER — Ambulatory Visit: Payer: Medicare Other | Admitting: Family Medicine

## 2020-09-21 DIAGNOSIS — Z79891 Long term (current) use of opiate analgesic: Secondary | ICD-10-CM | POA: Diagnosis not present

## 2020-09-21 DIAGNOSIS — G3184 Mild cognitive impairment, so stated: Secondary | ICD-10-CM | POA: Diagnosis not present

## 2020-09-21 DIAGNOSIS — Z993 Dependence on wheelchair: Secondary | ICD-10-CM | POA: Diagnosis not present

## 2020-09-21 DIAGNOSIS — G8929 Other chronic pain: Secondary | ICD-10-CM | POA: Diagnosis not present

## 2020-09-21 DIAGNOSIS — Z681 Body mass index (BMI) 19 or less, adult: Secondary | ICD-10-CM | POA: Diagnosis not present

## 2020-09-21 DIAGNOSIS — M25551 Pain in right hip: Secondary | ICD-10-CM | POA: Diagnosis not present

## 2020-09-21 DIAGNOSIS — R2681 Unsteadiness on feet: Secondary | ICD-10-CM | POA: Diagnosis not present

## 2020-09-21 DIAGNOSIS — R32 Unspecified urinary incontinence: Secondary | ICD-10-CM | POA: Diagnosis not present

## 2020-09-21 DIAGNOSIS — M159 Polyosteoarthritis, unspecified: Secondary | ICD-10-CM | POA: Diagnosis not present

## 2020-09-21 DIAGNOSIS — R636 Underweight: Secondary | ICD-10-CM | POA: Diagnosis not present

## 2020-09-21 DIAGNOSIS — E46 Unspecified protein-calorie malnutrition: Secondary | ICD-10-CM | POA: Diagnosis not present

## 2020-09-22 ENCOUNTER — Telehealth: Payer: Self-pay

## 2020-09-22 NOTE — Telephone Encounter (Signed)
[facility] ([facility] is calling asking what Dr Moshe Cipro wanted them to do with this patient. [phone] No other information was left

## 2020-09-23 NOTE — Telephone Encounter (Signed)
Please advise 

## 2020-09-25 ENCOUNTER — Emergency Department (HOSPITAL_COMMUNITY)
Admission: EM | Admit: 2020-09-25 | Discharge: 2020-09-26 | Disposition: A | Discharge status: Home / Self Care | Payer: Medicare Other | Attending: Emergency Medicine | Admitting: Emergency Medicine

## 2020-09-25 ENCOUNTER — Emergency Department (HOSPITAL_COMMUNITY): Payer: Medicare Other

## 2020-09-25 ENCOUNTER — Other Ambulatory Visit: Payer: Self-pay

## 2020-09-25 DIAGNOSIS — G8929 Other chronic pain: Secondary | ICD-10-CM | POA: Diagnosis not present

## 2020-09-25 DIAGNOSIS — J90 Pleural effusion, not elsewhere classified: Secondary | ICD-10-CM | POA: Diagnosis not present

## 2020-09-25 DIAGNOSIS — Z20822 Contact with and (suspected) exposure to covid-19: Secondary | ICD-10-CM | POA: Insufficient documentation

## 2020-09-25 DIAGNOSIS — M545 Low back pain, unspecified: Secondary | ICD-10-CM | POA: Diagnosis not present

## 2020-09-25 DIAGNOSIS — R079 Chest pain, unspecified: Secondary | ICD-10-CM | POA: Diagnosis not present

## 2020-09-25 DIAGNOSIS — I2510 Atherosclerotic heart disease of native coronary artery without angina pectoris: Secondary | ICD-10-CM | POA: Insufficient documentation

## 2020-09-25 DIAGNOSIS — R0902 Hypoxemia: Secondary | ICD-10-CM | POA: Diagnosis not present

## 2020-09-25 DIAGNOSIS — R52 Pain, unspecified: Secondary | ICD-10-CM | POA: Diagnosis not present

## 2020-09-25 DIAGNOSIS — Z96641 Presence of right artificial hip joint: Secondary | ICD-10-CM | POA: Insufficient documentation

## 2020-09-25 DIAGNOSIS — Z471 Aftercare following joint replacement surgery: Secondary | ICD-10-CM | POA: Diagnosis not present

## 2020-09-25 DIAGNOSIS — B9620 Unspecified Escherichia coli [E. coli] as the cause of diseases classified elsewhere: Secondary | ICD-10-CM | POA: Diagnosis not present

## 2020-09-25 DIAGNOSIS — M549 Dorsalgia, unspecified: Secondary | ICD-10-CM | POA: Diagnosis present

## 2020-09-25 DIAGNOSIS — M546 Pain in thoracic spine: Secondary | ICD-10-CM | POA: Diagnosis not present

## 2020-09-25 DIAGNOSIS — I10 Essential (primary) hypertension: Secondary | ICD-10-CM | POA: Diagnosis not present

## 2020-09-25 LAB — URINALYSIS, ROUTINE W REFLEX MICROSCOPIC
Bacteria, UA: NONE SEEN
Bilirubin Urine: NEGATIVE
Glucose, UA: NEGATIVE mg/dL
Ketones, ur: NEGATIVE mg/dL
Nitrite: NEGATIVE
Protein, ur: NEGATIVE mg/dL
Specific Gravity, Urine: 1.004 — ABNORMAL LOW (ref 1.005–1.030)
pH: 7 (ref 5.0–8.0)

## 2020-09-25 LAB — RESP PANEL BY RT-PCR (FLU A&B, COVID) ARPGX2
Influenza A by PCR: NEGATIVE
Influenza B by PCR: NEGATIVE
SARS Coronavirus 2 by RT PCR: NEGATIVE

## 2020-09-25 MED ORDER — FENTANYL CITRATE (PF) 100 MCG/2ML IJ SOLN
50.0000 ug | Freq: Once | INTRAMUSCULAR | Status: AC
  Administered 2020-09-25: 50 ug via INTRAVENOUS
  Filled 2020-09-25: qty 2

## 2020-09-25 MED ORDER — OXYCODONE-ACETAMINOPHEN 5-325 MG PO TABS
2.0000 | ORAL_TABLET | Freq: Once | ORAL | Status: AC
  Administered 2020-09-25: 2 via ORAL
  Filled 2020-09-25: qty 2
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
## 2020-09-25 NOTE — ED Notes (Signed)
Patient transported to X-ray 

## 2020-09-25 NOTE — ED Triage Notes (Signed)
Pt c/o rt hip pain. Pt had a hip replacement 2 years ago. Pt had complications ever since.   Pt is here for pain control because her normal pain medication is not working.

## 2020-09-25 NOTE — ED Notes (Signed)
Pt movedd with great difficulty to the stretcher   Moving very slowly and arduously at daughters request  Per daughter report, pt cannot sit on regular toilet as it is too low for pt to get up even with assistance   She was moved into bed by this RN and Jerline Pain, NT  Pt put on bedpan in room

## 2020-09-26 DIAGNOSIS — G8929 Other chronic pain: Secondary | ICD-10-CM | POA: Diagnosis not present

## 2020-09-26 DIAGNOSIS — Z7401 Bed confinement status: Secondary | ICD-10-CM | POA: Diagnosis not present

## 2020-09-26 DIAGNOSIS — R531 Weakness: Secondary | ICD-10-CM | POA: Diagnosis not present

## 2020-09-26 DIAGNOSIS — M545 Low back pain, unspecified: Secondary | ICD-10-CM | POA: Diagnosis not present

## 2020-09-26 DIAGNOSIS — R079 Chest pain, unspecified: Secondary | ICD-10-CM | POA: Diagnosis not present

## 2020-09-26 DIAGNOSIS — R279 Unspecified lack of coordination: Secondary | ICD-10-CM | POA: Diagnosis not present

## 2020-09-26 DIAGNOSIS — R5381 Other malaise: Secondary | ICD-10-CM | POA: Diagnosis not present

## 2020-09-26 DIAGNOSIS — M549 Dorsalgia, unspecified: Secondary | ICD-10-CM | POA: Diagnosis present

## 2020-09-26 LAB — COMPREHENSIVE METABOLIC PANEL
ALT: 11 U/L (ref 0–44)
AST: 20 U/L (ref 15–41)
Albumin: 3.2 g/dL — ABNORMAL LOW (ref 3.5–5.0)
Alkaline Phosphatase: 90 U/L (ref 38–126)
Anion gap: 7 (ref 5–15)
BUN: 8 mg/dL (ref 8–23)
CO2: 27 mmol/L (ref 22–32)
Calcium: 8.9 mg/dL (ref 8.9–10.3)
Chloride: 104 mmol/L (ref 98–111)
Creatinine, Ser: 0.6 mg/dL (ref 0.44–1.00)
GFR, Estimated: >60 mL/min (ref >60)
Glucose, Bld: 112 mg/dL — ABNORMAL HIGH (ref 70–99)
Potassium: 3.5 mmol/L (ref 3.5–5.1)
Sodium: 138 mmol/L (ref 135–145)
Total Bilirubin: 0.5 mg/dL (ref 0.3–1.2)
Total Protein: 6.5 g/dL (ref 6.5–8.1)

## 2020-09-26 LAB — CBC WITH DIFFERENTIAL/PLATELET
Abs Immature Granulocytes: 0.02 10*3/uL (ref 0.00–0.07)
Basophils Absolute: 0.1 10*3/uL (ref 0.0–0.1)
Basophils Relative: 1 %
Eosinophils Absolute: 0.2 10*3/uL (ref 0.0–0.5)
Eosinophils Relative: 2 %
HCT: 31.3 % — ABNORMAL LOW (ref 36.0–46.0)
Hemoglobin: 10 g/dL — ABNORMAL LOW (ref 12.0–15.0)
Immature Granulocytes: 0 %
Lymphocytes Relative: 36 %
Lymphs Abs: 3.1 10*3/uL (ref 0.7–4.0)
MCH: 31.8 pg (ref 26.0–34.0)
MCHC: 31.9 g/dL (ref 30.0–36.0)
MCV: 99.7 fL (ref 80.0–100.0)
Monocytes Absolute: 0.8 10*3/uL (ref 0.1–1.0)
Monocytes Relative: 10 %
Neutro Abs: 4.4 10*3/uL (ref 1.7–7.7)
Neutrophils Relative %: 51 %
Platelets: 290 10*3/uL (ref 150–400)
RBC: 3.14 MIL/uL — ABNORMAL LOW (ref 3.87–5.11)
RDW: 18.3 % — ABNORMAL HIGH (ref 11.5–15.5)
WBC: 8.5 10*3/uL (ref 4.0–10.5)
nRBC: 0 % (ref 0.0–0.2)

## 2020-09-26 MED ORDER — OXYCODONE HCL 5 MG PO TABS: 5.0000 mg | ORAL_TABLET | ORAL | Status: DC | PRN

## 2020-09-26 MED ORDER — METHOCARBAMOL 500 MG PO TABS
500.0000 mg | ORAL_TABLET | Freq: Three times a day (TID) | ORAL | Status: DC
  Filled 2020-09-26: qty 1

## 2020-09-26 MED ORDER — SENNOSIDES-DOCUSATE SODIUM 8.6-50 MG PO TABS: 2.0000 | ORAL_TABLET | Freq: Every day | ORAL | 4 refills | Status: DC

## 2020-09-26 MED ORDER — ACETAMINOPHEN 325 MG PO TABS: 650.0000 mg | ORAL_TABLET | ORAL | Status: DC | PRN

## 2020-09-26 MED ORDER — MORPHINE SULFATE (PF) 2 MG/ML IV SOLN
2.0000 mg | INTRAVENOUS | Status: DC | PRN
  Administered 2020-09-26: 2 mg via INTRAVENOUS
  Filled 2020-09-26: qty 1

## 2020-09-26 MED ORDER — POLYETHYLENE GLYCOL 3350 17 G PO PACK: 17.0000 g | PACK | Freq: Every day | ORAL | 1 refills | Status: DC

## 2020-09-26 MED ORDER — CEPHALEXIN 500 MG PO CAPS: 500.0000 mg | ORAL_CAPSULE | Freq: Three times a day (TID) | ORAL | 0 refills | Status: AC

## 2020-09-26 MED ORDER — METHOCARBAMOL 500 MG PO TABS: 500.0000 mg | ORAL_TABLET | Freq: Three times a day (TID) | ORAL | 1 refills | Status: DC | PRN

## 2020-09-26 NOTE — ED Notes (Signed)
Physical Therapy with pt

## 2020-09-26 NOTE — Plan of Care (Signed)
  Problem: Acute Rehab PT Goals(only PT should resolve) Goal: Pt Will Go Supine/Side To Sit Outcome: Progressing Flowsheets (Taken 09/26/2020 1407) Pt will go Supine/Side to Sit:  with minimal assist  with moderate assist Goal: Patient Will Transfer Sit To/From Stand Outcome: Progressing Flowsheets (Taken 09/26/2020 1407) Patient will transfer sit to/from stand:  with minimal assist  with moderate assist Goal: Pt Will Transfer Bed To Chair/Chair To Bed Outcome: Progressing Flowsheets (Taken 09/26/2020 1407) Pt will Transfer Bed to Chair/Chair to Bed: with mod assist Goal: Pt Will Ambulate Outcome: Progressing Flowsheets (Taken 09/26/2020 1407) Pt will Ambulate:  15 feet  with moderate assist  with rolling walker   2:08 PM, 09/26/20 Lonell Grandchild, MPT Physical Therapist with [facility] [phone] office [phone] mobile phone

## 2020-09-26 NOTE — Discharge Instructions (Signed)
-  You have refused transfer/admission to skilled nursing facility for rehab -You are returning home with your family home with home health services including home health physical therapy -Your pain medications and muscle relaxants have been adjusted -Please follow-up with your primary care physician or your orthopedic physician for further management of your back and hip pain

## 2020-09-26 NOTE — ED Notes (Signed)
Pt is stable and a/w admission

## 2020-09-26 NOTE — ED Notes (Signed)
[facility] scheduler called to set up transportation at this time.

## 2020-09-26 NOTE — TOC Progression Note (Signed)
Notified by MD that he has ordered PT/OT eval and that pt does not need hospital admission. PT has seen pt and recommended SNF. This LCSW spoke with pt's daughter, Kara [name], to discuss recommendation. Per Kara [name], family does not want SNF referral. Kara [name] states that she is going to take pt to her home in [place]. Pt was previously staying with another daughter, Kara [name], in [place] prior to admission. Kara [name] states that pt is active with [facility] and they will notify [facility] of the change in residence. Kara [name] also states that they have all the DME they need.  Updated MD. TOC will sign off.
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
## 2020-09-26 NOTE — ED Provider Notes (Signed)
Kara [name] was left at change of shift to get her laboratory results and then talk to the hospitalist about admission.  Kara [name] has been bedbound since she had a hip replacement done did not do well and needed to be revised however they decided not to do that.  She has been having worsening back pain.  Currently the family is having difficulty taking care of her due to her back pain.  On her x-rays tonight there is a questionable new L1 compression fracture and some thoracic spine fractures of uncertain age.  Dr. Alvino Chapel felt she should be admitted for pain control and to have physical therapy and occupational therapy and possible placement since family is having difficulty caring for her now.   5:19 AM Dr. Josephine Cables, hospitalist will admit.  Results for orders placed or performed during the hospital encounter of 09/25/20  Resp Panel by RT-PCR (Flu A&B, Covid) Nasopharyngeal Swab   Specimen: Nasopharyngeal Swab; Nasopharyngeal(NP) swabs in vial transport medium  Result Value Ref Range   SARS Coronavirus 2 by RT PCR NEGATIVE NEGATIVE   Influenza A by PCR NEGATIVE NEGATIVE   Influenza B by PCR NEGATIVE NEGATIVE  Comprehensive metabolic panel  Result Value Ref Range   Sodium 138 135 - 145 mmol/L   Potassium 3.5 3.5 - 5.1 mmol/L   Chloride 104 98 - 111 mmol/L   CO2 27 22 - 32 mmol/L   Glucose, Bld 112 (H) 70 - 99 mg/dL   BUN 8 8 - 23 mg/dL   Creatinine, Ser 0.60 0.44 - 1.00 mg/dL   Calcium 8.9 8.9 - 10.3 mg/dL   Total Protein 6.5 6.5 - 8.1 g/dL   Albumin 3.2 (L) 3.5 - 5.0 g/dL   AST 20 15 - 41 U/L   ALT 11 0 - 44 U/L   Alkaline Phosphatase 90 38 - 126 U/L   Total Bilirubin 0.5 0.3 - 1.2 mg/dL   GFR, Estimated >60 >60 mL/min   Anion gap 7 5 - 15  Urinalysis, Routine w reflex microscopic Urine, Clean Catch  Result Value Ref Range   Color, Urine STRAW (A) YELLOW   APPearance CLEAR CLEAR   Specific Gravity, Urine 1.004 (L) 1.005 - 1.030   pH 7.0 5.0 - 8.0   Glucose, UA NEGATIVE NEGATIVE  mg/dL   Hgb urine dipstick SMALL (A) NEGATIVE   Bilirubin Urine NEGATIVE NEGATIVE   Ketones, ur NEGATIVE NEGATIVE mg/dL   Protein, ur NEGATIVE NEGATIVE mg/dL   Nitrite NEGATIVE NEGATIVE   Leukocytes,Ua SMALL (A) NEGATIVE   RBC / HPF 0-5 0 - 5 RBC/hpf   WBC, UA 21-50 0 - 5 WBC/hpf   Bacteria, UA NONE SEEN NONE SEEN   Squamous Epithelial / LPF 0-5 0 - 5  CBC with Differential  Result Value Ref Range   WBC 8.5 4.0 - 10.5 K/uL   RBC 3.14 (L) 3.87 - 5.11 MIL/uL   Hemoglobin 10.0 (L) 12.0 - 15.0 g/dL   HCT 31.3 (L) 36 - 46 %   MCV 99.7 80.0 - 100.0 fL   MCH 31.8 26.0 - 34.0 pg   MCHC 31.9 30.0 - 36.0 g/dL   RDW 18.3 (H) 11.5 - 15.5 %   Platelets 290 150 - 400 K/uL   nRBC 0.0 0.0 - 0.2 %   Neutrophils Relative % 51 %   Neutro Abs 4.4 1.7 - 7.7 K/uL   Lymphocytes Relative 36 %   Lymphs Abs 3.1 0.7 - 4.0 K/uL   Monocytes Relative 10 %  Monocytes Absolute 0.8 0.1 - 1.0 K/uL   Eosinophils Relative 2 %   Eosinophils Absolute 0.2 0.0 - 0.5 K/uL   Basophils Relative 1 %   Basophils Absolute 0.1 0.0 - 0.1 K/uL   Immature Granulocytes 0 %   Abs Immature Granulocytes 0.02 0.00 - 0.07 K/uL    Laboratory interpretation all normal except mild anemia   DG Chest 1 View  Result Date: 09/25/2020 CLINICAL DATA:  There is EXAM: CHEST  1 VIEW COMPARISON:  September 22, 2018 FINDINGS: The heart size is mildly enlarged. Atherosclerotic changes are noted of the thoracic aorta. There is no pneumothorax or large pleural effusion. Pleuroparenchymal scarring is noted at the lung apices, right greater than left. There is no definite acute osseous abnormality. No focal infiltrate. Old healed right-sided rib fractures are noted. IMPRESSION: No active disease. Electronically Signed   By: Constance Holster M.D.   On: 09/25/2020 19:37   DG Thoracic Spine 2 View  Result Date: 09/25/2020 CLINICAL DATA:  Pain EXAM: THORACIC SPINE 2 VIEWS; LUMBAR SPINE - COMPLETE 4+ VIEW COMPARISON:  May 01, 2017 FINDINGS: There  is mild height loss of several midthoracic vertebral bodies, likely chronic. Atherosclerotic changes are noted throughout the thoracic aorta. Mild degenerative changes are noted of the thoracic spine. The cervical spine is suboptimally evaluated on this study. There are degenerative changes throughout the lumbar spine. There is multilevel vertebral body height loss, most notably at the L2 and L3 levels. This appears stable since 2019. There is new mild height loss of the L1 vertebral body which is age indeterminate. There are degenerative changes throughout the lumbar spine IMPRESSION: 1. Chronic appearing height loss is noted involving multiple vertebral bodies throughout the thoracolumbar spine as detailed above. 2. Age-indeterminate mild height loss of the L1 vertebral body, new since 2019. 3. Multilevel degenerative changes are noted throughout the thoracolumbar spine. 4.  Aortic Atherosclerosis (ICD10-I70.0). Electronically Signed   By: Constance Holster M.D.   On: 09/25/2020 19:36   DG Lumbar Spine Complete  Result Date: 09/25/2020 CLINICAL DATA:  Pain EXAM: THORACIC SPINE 2 VIEWS; LUMBAR SPINE - COMPLETE 4+ VIEW COMPARISON:  May 01, 2017 FINDINGS: There is mild height loss of several midthoracic vertebral bodies, likely chronic. Atherosclerotic changes are noted throughout the thoracic aorta. Mild degenerative changes are noted of the thoracic spine. The cervical spine is suboptimally evaluated on this study. There are degenerative changes throughout the lumbar spine. There is multilevel vertebral body height loss, most notably at the L2 and L3 levels. This appears stable since 2019. There is new mild height loss of the L1 vertebral body which is age indeterminate. There are degenerative changes throughout the lumbar spine IMPRESSION: 1. Chronic appearing height loss is noted involving multiple vertebral bodies throughout the thoracolumbar spine as detailed above. 2. Age-indeterminate mild height loss  of the L1 vertebral body, new since 2019. 3. Multilevel degenerative changes are noted throughout the thoracolumbar spine. 4.  Aortic Atherosclerosis (ICD10-I70.0). Electronically Signed   By: Constance Holster M.D.   On: 09/25/2020 19:36   DG Hip Unilat W or Wo Pelvis 2-3 Views Right  Result Date: 09/25/2020 CLINICAL DATA:  Pain EXAM: DG HIP (WITH OR WITHOUT PELVIS) 2-3V RIGHT COMPARISON:  September 24, 2018 FINDINGS: The Kara [name] is status post total hip arthroplasty on the right. There is diffuse osteopenia which limits detection of nondisplaced fractures. Given this limitation, there is no definite acute displaced fracture or dislocation identified on this study. There are mild degenerative changes  of the left hip. Vascular calcifications are noted. IMPRESSION: 1. No acute displaced fracture or dislocation, however evaluation is limited by osteopenia. 2. Status post total hip arthroplasty on the right. Electronically Signed   By: Constance Holster M.D.   On: 09/25/2020 19:32   Diagnoses that have been ruled out:  None  Diagnoses that are still under consideration:  None  Final diagnoses:  Chronic back pain, unspecified back location, unspecified back pain laterality  Intractable back pain   Plan admission  Rolland Porter, MD, Barbette Or, MD 09/26/20 [phone]

## 2020-09-26 NOTE — Evaluation (Signed)
Physical Therapy Evaluation Patient Details Name: Kara [name] MRN: [id number] DOB: Aug 02, 1927 Today's Date: 09/26/2020   History of Present Illness  Kara [name] is a 86 y.o. female.  Patient presents with back pain.  Has had chronic hip pain after a previous hip replacement 2 years ago.  However recently has gotten more pain in her mid to lower back.  No injury.  Basically bedbound at baseline.  Pain is gotten worse.  Unrelieved with her pain medicines at home.  Has previously had a lumbar compression fracture.Patient's been unable to manage at home with pain meds.  Has been unable even to be adjusted to go to the bathroom with the care she is getting at home.    Clinical Impression  Patient has difficulty sitting up at bedside due to c/o severe low back pain and right hip discomfort, at severe risk for falls and limited to a few side steps at bedside before having to sit due increased pain.  Patient put back to bed with Max assist to reposition.  Patient will benefit from continued physical therapy in hospital and recommended venue below to increase strength, balance, endurance for safe ADLs and gait.    Follow Up Recommendations SNF    Equipment Recommendations  None recommended by PT    Recommendations for Other Services       Precautions / Restrictions Precautions Precautions: Fall Restrictions Weight Bearing Restrictions: No      Mobility  Bed Mobility Overal bed mobility: Needs Assistance Bed Mobility: Supine to Sit;Sit to Supine     Supine to sit: Mod assist;Max assist Sit to supine: Mod assist   General bed mobility comments: slow labored movement    Transfers Overall transfer level: Needs assistance Equipment used: Rolling walker (2 wheeled) Transfers: Sit to/from Stand Sit to Stand: Mod assist         General transfer comment: increased time, labored movement  Ambulation/Gait Ambulation/Gait assistance: Mod assist;Max assist Gait Distance (Feet): 3  Feet Assistive device: Rolling walker (2 wheeled) Gait Pattern/deviations: Decreased step length - right;Decreased step length - left;Decreased stance time - right;Decreased stride length Gait velocity: slow   General Gait Details: limited to 3-4 slow labored unsteady side steps with c/o severe low back pain  Stairs            Wheelchair Mobility    Modified Rankin (Stroke Patients Only)       Balance Overall balance assessment: Needs assistance Sitting-balance support: Bilateral upper extremity supported;Feet supported Sitting balance-Leahy Scale: Fair Sitting balance - Comments: seated at EOB   Standing balance support: Bilateral upper extremity supported;During functional activity Standing balance-Leahy Scale: Poor Standing balance comment: using RW                             Pertinent Vitals/Pain Pain Assessment: Faces Faces Pain Scale: Hurts even more Pain Location: low back mostly, right hip Pain Descriptors / Indicators: Grimacing;Guarding;Sore Pain Intervention(s): Limited activity within patient's tolerance;Monitored during session;Premedicated before session;Repositioned    Home Living Family/patient expects to be discharged to:: Private residence Living Arrangements: Children Available Help at Discharge: Family;Available 24 hours/day Type of Home: Mobile home Home Access: Ramped entrance     Home Layout: One level Home Equipment: [place] - 2 wheels;Bedside commode;Shower seat;Wheelchair - manual      Prior Function Level of Independence: Needs assistance   Gait / Transfers Assistance Needed: Assisted bed/wheelchair/commode transfers, uses wheelchair for mobility  ADL's /  Homemaking Assistance Needed: assisted by family        Hand Dominance        Extremity/Trunk Assessment   Upper Extremity Assessment Upper Extremity Assessment: Defer to OT evaluation    Lower Extremity Assessment Lower Extremity Assessment: Generalized  weakness    Cervical / Trunk Assessment Cervical / Trunk Assessment: Kyphotic  Communication   Communication: No difficulties  Cognition Arousal/Alertness: Awake/alert Behavior During Therapy: WFL for tasks assessed/performed;Anxious Overall Cognitive Status: Within Functional Limits for tasks assessed                                        General Comments      Exercises     Assessment/Plan    PT Assessment Patient needs continued PT services  PT Problem List Decreased strength;Decreased activity tolerance;Decreased balance;Decreased mobility;Decreased knowledge of use of DME;Pain       PT Treatment Interventions DME instruction;Gait training;Stair training;Functional mobility training;Therapeutic activities;Therapeutic exercise;Balance training;Patient/family education    PT Goals (Current goals can be found in the Care Plan section)  Acute Rehab PT Goals Patient Stated Goal: return home with family to assist PT Goal Formulation: With patient Time For Goal Achievement: 10/10/20 Potential to Achieve Goals: Good    Frequency Min 3X/week   Barriers to discharge        Co-evaluation               AM-PAC PT "6 Clicks" Mobility  Outcome Measure Help needed turning from your back to your side while in a flat bed without using bedrails?: A Lot Help needed moving from lying on your back to sitting on the side of a flat bed without using bedrails?: A Lot Help needed moving to and from a bed to a chair (including a wheelchair)?: A Lot Help needed standing up from a chair using your arms (e.g., wheelchair or bedside chair)?: A Lot Help needed to walk in hospital room?: Total Help needed climbing 3-5 steps with a railing? : Total 6 Click Score: 10    End of Session   Activity Tolerance: Patient tolerated treatment well;Patient limited by fatigue;Patient limited by pain Patient left: in bed;with call bell/phone within reach Nurse Communication:  Mobility status PT Visit Diagnosis: Unsteadiness on feet (R26.81);Other abnormalities of gait and mobility (R26.89);Muscle weakness (generalized) (M62.81)    Time: 2505-3976 PT Time Calculation (min) (ACUTE ONLY): 26 min   Charges:   PT Evaluation $PT Eval Moderate Complexity: 1 Mod PT Treatments $Therapeutic Activity: 23-37 mins        2:04 PM, 09/26/20 Lonell Grandchild, MPT Physical Therapist with [facility] 336 [phone] office [phone] mobile phone

## 2020-09-27 ENCOUNTER — Telehealth: Payer: Self-pay

## 2020-09-27 DIAGNOSIS — E46 Unspecified protein-calorie malnutrition: Secondary | ICD-10-CM | POA: Diagnosis not present

## 2020-09-27 DIAGNOSIS — G8929 Other chronic pain: Secondary | ICD-10-CM | POA: Diagnosis not present

## 2020-09-27 DIAGNOSIS — G3184 Mild cognitive impairment, so stated: Secondary | ICD-10-CM | POA: Diagnosis not present

## 2020-09-27 DIAGNOSIS — M159 Polyosteoarthritis, unspecified: Secondary | ICD-10-CM | POA: Diagnosis not present

## 2020-09-27 DIAGNOSIS — R2681 Unsteadiness on feet: Secondary | ICD-10-CM | POA: Diagnosis not present

## 2020-09-27 DIAGNOSIS — M25551 Pain in right hip: Secondary | ICD-10-CM | POA: Diagnosis not present

## 2020-09-27 NOTE — Telephone Encounter (Signed)
Cassandra from [facility] called and wants to know what labs need to be done on the patient. (all I saw was a TSH and she did have some recent labs at the hospital) Please advise  Will call cassandra back and let her know [phone]

## 2020-09-27 NOTE — Telephone Encounter (Signed)
tsh

## 2020-09-27 NOTE — Telephone Encounter (Signed)
TSH only, thanks

## 2020-09-28 ENCOUNTER — Other Ambulatory Visit: Payer: Self-pay

## 2020-09-28 ENCOUNTER — Encounter: Payer: Self-pay | Admitting: Family Medicine

## 2020-09-28 ENCOUNTER — Telehealth (INDEPENDENT_AMBULATORY_CARE_PROVIDER_SITE_OTHER): Payer: Medicare Other | Admitting: Family Medicine

## 2020-09-28 DIAGNOSIS — Z742 Need for assistance at home and no other household member able to render care: Secondary | ICD-10-CM

## 2020-09-28 DIAGNOSIS — N390 Urinary tract infection, site not specified: Secondary | ICD-10-CM | POA: Diagnosis not present

## 2020-09-28 DIAGNOSIS — E785 Hyperlipidemia, unspecified: Secondary | ICD-10-CM | POA: Diagnosis not present

## 2020-09-28 DIAGNOSIS — R636 Underweight: Secondary | ICD-10-CM | POA: Diagnosis not present

## 2020-09-28 DIAGNOSIS — R946 Abnormal results of thyroid function studies: Secondary | ICD-10-CM

## 2020-09-28 DIAGNOSIS — I2510 Atherosclerotic heart disease of native coronary artery without angina pectoris: Secondary | ICD-10-CM | POA: Diagnosis not present

## 2020-09-28 NOTE — Progress Notes (Signed)
Virtual Visit via Telephone Note  I connected with Kara [name] on 61/[birth year] at  2:00 PM EST by telephone and verified that I am speaking with the correct person using two identifiers.  Location:  Patient: home with dEB[facility]  Provider: OFFICE   I discussed the limitations, risks, security and privacy concerns of performing an evaluation and management service by telephone and the availability of in person appointments. I also discussed with the patient that there may be a patient responsible charge related to this service. The patient expressed understanding and agreed to proceed.   History of Present Illness: F/U chronic problems   Observations/Objective:  There were no vitals taken for this visit. Daughter provides history patient has hearing impairment, and mCI. Was able to answer questions when directly asked   Assessment and Plan: Need for home health care Home bound, also essentially bed  Bound, needs in home health care. Nurse to admin flu vaccine and draw labs  Abnormal thyroid function test Updated lab needed at/ before next visit. No taking replacement regulalrly  UTI (urinary tract infection) Currently on antibiotic from recent Ed visit, advised to complete course and ensure increased water intake with regular voiding  Underweight due to inadequate caloric intake Increased caloric intake with ensure suppleemt encouraged, family reports improved intake  Hyperlipidemia LDL goal <100 Hyperlipidemia:Low fat diet discussed and encouraged.   Lipid Panel  Lab Results  Component Value Date   CHOL 204 (H) 05/16/2020   HDL 64 05/16/2020   LDLCALC 111 (H) 05/16/2020   TRIG 175 (H) 05/16/2020   CHOLHDL 3.2 05/16/2020     Updated lab needed at/ before next visit.     Follow Up Instructions:    I discussed the assessment and treatment plan with the patient. The patient was provided an opportunity to ask questions and all were answered. The patient agreed with the  plan and demonstrated an understanding of the instructions.   The patient was advised to call back or seek an in-person evaluation if the symptoms worsen or if the condition fails to improve as anticipated.  I provided 20 minutes of non-face-to-face time during this encounter.   [name], MD

## 2020-09-28 NOTE — Patient Instructions (Addendum)
F/u IN 4 MONTHS, call if you need me sooner Home health nurse to administer flu vaccine, draw TSH and fasting lipid panel  You have a urinary tract infection from recent ED visit and are on keflex for this  Be careful not to fall  I hope that medication for back pain helps you  To feel better   Best for 2022!

## 2020-09-28 NOTE — Telephone Encounter (Signed)
Called and left voicemail with cassandra on the needed lab

## 2020-09-29 DIAGNOSIS — R2681 Unsteadiness on feet: Secondary | ICD-10-CM | POA: Diagnosis not present

## 2020-09-29 DIAGNOSIS — G8929 Other chronic pain: Secondary | ICD-10-CM | POA: Diagnosis not present

## 2020-09-29 DIAGNOSIS — E46 Unspecified protein-calorie malnutrition: Secondary | ICD-10-CM | POA: Diagnosis not present

## 2020-09-29 DIAGNOSIS — G3184 Mild cognitive impairment, so stated: Secondary | ICD-10-CM | POA: Diagnosis not present

## 2020-09-29 DIAGNOSIS — M159 Polyosteoarthritis, unspecified: Secondary | ICD-10-CM | POA: Diagnosis not present

## 2020-09-29 DIAGNOSIS — M25551 Pain in right hip: Secondary | ICD-10-CM | POA: Diagnosis not present

## 2020-09-29 LAB — URINE CULTURE
Culture: 30000 — AB
Special Requests: NORMAL

## 2020-09-30 DIAGNOSIS — G8929 Other chronic pain: Secondary | ICD-10-CM | POA: Diagnosis not present

## 2020-09-30 DIAGNOSIS — M159 Polyosteoarthritis, unspecified: Secondary | ICD-10-CM | POA: Diagnosis not present

## 2020-09-30 DIAGNOSIS — M25551 Pain in right hip: Secondary | ICD-10-CM | POA: Diagnosis not present

## 2020-09-30 DIAGNOSIS — E46 Unspecified protein-calorie malnutrition: Secondary | ICD-10-CM | POA: Diagnosis not present

## 2020-09-30 DIAGNOSIS — G3184 Mild cognitive impairment, so stated: Secondary | ICD-10-CM | POA: Diagnosis not present

## 2020-09-30 DIAGNOSIS — R2681 Unsteadiness on feet: Secondary | ICD-10-CM | POA: Diagnosis not present

## 2020-09-30 DIAGNOSIS — E755 Other lipid storage disorders: Secondary | ICD-10-CM | POA: Diagnosis not present

## 2020-10-03 ENCOUNTER — Encounter: Payer: Self-pay | Admitting: Family Medicine

## 2020-10-03 DIAGNOSIS — N390 Urinary tract infection, site not specified: Secondary | ICD-10-CM | POA: Insufficient documentation

## 2020-10-03 NOTE — Assessment & Plan Note (Signed)
Home bound, also essentially bed  Bound, needs in home health care. Nurse to admin flu vaccine and draw labs

## 2020-10-03 NOTE — Assessment & Plan Note (Signed)
Increased caloric intake with ensure suppleemt encouraged, family reports improved intake

## 2020-10-03 NOTE — Assessment & Plan Note (Signed)
Currently on antibiotic from recent Ed visit, advised to complete course and ensure increased water intake with regular voiding

## 2020-10-03 NOTE — Assessment & Plan Note (Signed)
Hyperlipidemia:Low fat diet discussed and encouraged.   Lipid Panel  Lab Results  Component Value Date   CHOL 204 (H) 05/16/2020   HDL 64 05/16/2020   LDLCALC 111 (H) 05/16/2020   TRIG 175 (H) 05/16/2020   CHOLHDL 3.2 05/16/2020     Updated lab needed at/ before next visit.

## 2020-10-03 NOTE — Assessment & Plan Note (Signed)
Updated lab needed at/ before next visit. No taking replacement regulalrly

## 2020-10-04 DIAGNOSIS — M159 Polyosteoarthritis, unspecified: Secondary | ICD-10-CM | POA: Diagnosis not present

## 2020-10-04 DIAGNOSIS — G8929 Other chronic pain: Secondary | ICD-10-CM | POA: Diagnosis not present

## 2020-10-04 DIAGNOSIS — R2681 Unsteadiness on feet: Secondary | ICD-10-CM | POA: Diagnosis not present

## 2020-10-04 DIAGNOSIS — M25551 Pain in right hip: Secondary | ICD-10-CM | POA: Diagnosis not present

## 2020-10-04 DIAGNOSIS — E46 Unspecified protein-calorie malnutrition: Secondary | ICD-10-CM | POA: Diagnosis not present

## 2020-10-04 DIAGNOSIS — G3184 Mild cognitive impairment, so stated: Secondary | ICD-10-CM | POA: Diagnosis not present

## 2020-10-06 DIAGNOSIS — G3184 Mild cognitive impairment, so stated: Secondary | ICD-10-CM | POA: Diagnosis not present

## 2020-10-06 DIAGNOSIS — G8929 Other chronic pain: Secondary | ICD-10-CM | POA: Diagnosis not present

## 2020-10-06 DIAGNOSIS — M25551 Pain in right hip: Secondary | ICD-10-CM | POA: Diagnosis not present

## 2020-10-06 DIAGNOSIS — M159 Polyosteoarthritis, unspecified: Secondary | ICD-10-CM | POA: Diagnosis not present

## 2020-10-06 DIAGNOSIS — R2681 Unsteadiness on feet: Secondary | ICD-10-CM | POA: Diagnosis not present

## 2020-10-06 DIAGNOSIS — E46 Unspecified protein-calorie malnutrition: Secondary | ICD-10-CM | POA: Diagnosis not present

## 2020-10-11 DIAGNOSIS — G8929 Other chronic pain: Secondary | ICD-10-CM | POA: Diagnosis not present

## 2020-10-11 DIAGNOSIS — G3184 Mild cognitive impairment, so stated: Secondary | ICD-10-CM | POA: Diagnosis not present

## 2020-10-11 DIAGNOSIS — E46 Unspecified protein-calorie malnutrition: Secondary | ICD-10-CM | POA: Diagnosis not present

## 2020-10-11 DIAGNOSIS — M25551 Pain in right hip: Secondary | ICD-10-CM | POA: Diagnosis not present

## 2020-10-11 DIAGNOSIS — M159 Polyosteoarthritis, unspecified: Secondary | ICD-10-CM | POA: Diagnosis not present

## 2020-10-11 DIAGNOSIS — R2681 Unsteadiness on feet: Secondary | ICD-10-CM | POA: Diagnosis not present

## 2020-10-12 DIAGNOSIS — G8928 Other chronic postprocedural pain: Secondary | ICD-10-CM | POA: Diagnosis not present

## 2020-10-12 DIAGNOSIS — F419 Anxiety disorder, unspecified: Secondary | ICD-10-CM | POA: Diagnosis not present

## 2020-10-12 DIAGNOSIS — M130 Polyarthritis, unspecified: Secondary | ICD-10-CM | POA: Diagnosis not present

## 2020-10-12 DIAGNOSIS — M545 Low back pain, unspecified: Secondary | ICD-10-CM | POA: Diagnosis not present

## 2020-10-12 DIAGNOSIS — M8000XD Age-related osteoporosis with current pathological fracture, unspecified site, subsequent encounter for fracture with routine healing: Secondary | ICD-10-CM | POA: Diagnosis not present

## 2020-10-12 DIAGNOSIS — M533 Sacrococcygeal disorders, not elsewhere classified: Secondary | ICD-10-CM | POA: Diagnosis not present

## 2020-10-12 DIAGNOSIS — M79604 Pain in right leg: Secondary | ICD-10-CM | POA: Diagnosis not present

## 2020-10-12 DIAGNOSIS — M5459 Other low back pain: Secondary | ICD-10-CM | POA: Diagnosis not present

## 2020-10-12 DIAGNOSIS — M25551 Pain in right hip: Secondary | ICD-10-CM | POA: Diagnosis not present

## 2020-10-12 DIAGNOSIS — M5416 Radiculopathy, lumbar region: Secondary | ICD-10-CM | POA: Diagnosis not present

## 2020-10-13 DIAGNOSIS — R2681 Unsteadiness on feet: Secondary | ICD-10-CM | POA: Diagnosis not present

## 2020-10-13 DIAGNOSIS — G8929 Other chronic pain: Secondary | ICD-10-CM | POA: Diagnosis not present

## 2020-10-13 DIAGNOSIS — M25551 Pain in right hip: Secondary | ICD-10-CM | POA: Diagnosis not present

## 2020-10-13 DIAGNOSIS — G3184 Mild cognitive impairment, so stated: Secondary | ICD-10-CM | POA: Diagnosis not present

## 2020-10-13 DIAGNOSIS — M159 Polyosteoarthritis, unspecified: Secondary | ICD-10-CM | POA: Diagnosis not present

## 2020-10-13 DIAGNOSIS — E46 Unspecified protein-calorie malnutrition: Secondary | ICD-10-CM | POA: Diagnosis not present

## 2020-10-14 DIAGNOSIS — G3184 Mild cognitive impairment, so stated: Secondary | ICD-10-CM | POA: Diagnosis not present

## 2020-10-14 DIAGNOSIS — Z79891 Long term (current) use of opiate analgesic: Secondary | ICD-10-CM | POA: Diagnosis not present

## 2020-10-14 DIAGNOSIS — R2681 Unsteadiness on feet: Secondary | ICD-10-CM | POA: Diagnosis not present

## 2020-10-14 DIAGNOSIS — Z993 Dependence on wheelchair: Secondary | ICD-10-CM | POA: Diagnosis not present

## 2020-10-14 DIAGNOSIS — R32 Unspecified urinary incontinence: Secondary | ICD-10-CM | POA: Diagnosis not present

## 2020-10-14 DIAGNOSIS — E46 Unspecified protein-calorie malnutrition: Secondary | ICD-10-CM | POA: Diagnosis not present

## 2020-10-14 DIAGNOSIS — M25551 Pain in right hip: Secondary | ICD-10-CM | POA: Diagnosis not present

## 2020-10-14 DIAGNOSIS — G8929 Other chronic pain: Secondary | ICD-10-CM | POA: Diagnosis not present

## 2020-10-14 DIAGNOSIS — M159 Polyosteoarthritis, unspecified: Secondary | ICD-10-CM | POA: Diagnosis not present

## 2020-10-14 DIAGNOSIS — R636 Underweight: Secondary | ICD-10-CM | POA: Diagnosis not present

## 2020-10-14 DIAGNOSIS — Z681 Body mass index (BMI) 19 or less, adult: Secondary | ICD-10-CM | POA: Diagnosis not present

## 2020-10-19 DIAGNOSIS — M25551 Pain in right hip: Secondary | ICD-10-CM | POA: Diagnosis not present

## 2020-10-19 DIAGNOSIS — G8929 Other chronic pain: Secondary | ICD-10-CM | POA: Diagnosis not present

## 2020-10-19 DIAGNOSIS — G3184 Mild cognitive impairment, so stated: Secondary | ICD-10-CM | POA: Diagnosis not present

## 2020-10-19 DIAGNOSIS — R2681 Unsteadiness on feet: Secondary | ICD-10-CM | POA: Diagnosis not present

## 2020-10-19 DIAGNOSIS — M159 Polyosteoarthritis, unspecified: Secondary | ICD-10-CM | POA: Diagnosis not present

## 2020-10-19 DIAGNOSIS — E46 Unspecified protein-calorie malnutrition: Secondary | ICD-10-CM | POA: Diagnosis not present

## 2020-10-20 DIAGNOSIS — G3184 Mild cognitive impairment, so stated: Secondary | ICD-10-CM | POA: Diagnosis not present

## 2020-10-20 DIAGNOSIS — R2681 Unsteadiness on feet: Secondary | ICD-10-CM | POA: Diagnosis not present

## 2020-10-20 DIAGNOSIS — E46 Unspecified protein-calorie malnutrition: Secondary | ICD-10-CM | POA: Diagnosis not present

## 2020-10-20 DIAGNOSIS — G8929 Other chronic pain: Secondary | ICD-10-CM | POA: Diagnosis not present

## 2020-10-20 DIAGNOSIS — M25551 Pain in right hip: Secondary | ICD-10-CM | POA: Diagnosis not present

## 2020-10-20 DIAGNOSIS — M159 Polyosteoarthritis, unspecified: Secondary | ICD-10-CM | POA: Diagnosis not present

## 2020-10-21 DIAGNOSIS — Z681 Body mass index (BMI) 19 or less, adult: Secondary | ICD-10-CM | POA: Diagnosis not present

## 2020-10-21 DIAGNOSIS — R636 Underweight: Secondary | ICD-10-CM | POA: Diagnosis not present

## 2020-10-21 DIAGNOSIS — R2681 Unsteadiness on feet: Secondary | ICD-10-CM | POA: Diagnosis not present

## 2020-10-21 DIAGNOSIS — G3184 Mild cognitive impairment, so stated: Secondary | ICD-10-CM | POA: Diagnosis not present

## 2020-10-21 DIAGNOSIS — G8929 Other chronic pain: Secondary | ICD-10-CM | POA: Diagnosis not present

## 2020-10-21 DIAGNOSIS — Z993 Dependence on wheelchair: Secondary | ICD-10-CM | POA: Diagnosis not present

## 2020-10-21 DIAGNOSIS — M159 Polyosteoarthritis, unspecified: Secondary | ICD-10-CM | POA: Diagnosis not present

## 2020-10-21 DIAGNOSIS — M25551 Pain in right hip: Secondary | ICD-10-CM | POA: Diagnosis not present

## 2020-10-21 DIAGNOSIS — E46 Unspecified protein-calorie malnutrition: Secondary | ICD-10-CM | POA: Diagnosis not present

## 2020-10-21 DIAGNOSIS — Z79891 Long term (current) use of opiate analgesic: Secondary | ICD-10-CM | POA: Diagnosis not present

## 2020-10-21 DIAGNOSIS — R32 Unspecified urinary incontinence: Secondary | ICD-10-CM | POA: Diagnosis not present

## 2020-10-25 DIAGNOSIS — G3184 Mild cognitive impairment, so stated: Secondary | ICD-10-CM | POA: Diagnosis not present

## 2020-10-25 DIAGNOSIS — G8929 Other chronic pain: Secondary | ICD-10-CM | POA: Diagnosis not present

## 2020-10-25 DIAGNOSIS — M25551 Pain in right hip: Secondary | ICD-10-CM | POA: Diagnosis not present

## 2020-10-25 DIAGNOSIS — M159 Polyosteoarthritis, unspecified: Secondary | ICD-10-CM | POA: Diagnosis not present

## 2020-10-25 DIAGNOSIS — R2681 Unsteadiness on feet: Secondary | ICD-10-CM | POA: Diagnosis not present

## 2020-10-25 DIAGNOSIS — E46 Unspecified protein-calorie malnutrition: Secondary | ICD-10-CM | POA: Diagnosis not present

## 2020-10-27 ENCOUNTER — Telehealth: Payer: Self-pay | Admitting: Orthopaedic Surgery

## 2020-10-27 DIAGNOSIS — R2681 Unsteadiness on feet: Secondary | ICD-10-CM | POA: Diagnosis not present

## 2020-10-27 DIAGNOSIS — G3184 Mild cognitive impairment, so stated: Secondary | ICD-10-CM | POA: Diagnosis not present

## 2020-10-27 DIAGNOSIS — M25551 Pain in right hip: Secondary | ICD-10-CM | POA: Diagnosis not present

## 2020-10-27 DIAGNOSIS — G8929 Other chronic pain: Secondary | ICD-10-CM | POA: Diagnosis not present

## 2020-10-27 DIAGNOSIS — M159 Polyosteoarthritis, unspecified: Secondary | ICD-10-CM | POA: Diagnosis not present

## 2020-10-27 DIAGNOSIS — E46 Unspecified protein-calorie malnutrition: Secondary | ICD-10-CM | POA: Diagnosis not present

## 2020-10-27 NOTE — Telephone Encounter (Signed)
Patient's daughter called. Says they need a RX for a shoe. Her call back number is [phone]

## 2020-10-27 NOTE — Telephone Encounter (Signed)
Can you please advise?

## 2020-10-28 NOTE — Telephone Encounter (Signed)
I called and left message . Will have to call her back

## 2020-10-28 NOTE — Telephone Encounter (Signed)
By xray maybe 1 and one half inch buildup for right leg being 2 inches short.  They can ROV to [facility] if she wants to be measured.

## 2020-10-31 NOTE — Telephone Encounter (Signed)
I called Kara [name]. She states that she feels the leg is even shorter than that and is going to make an appointment in [place] for her to be measured. I offered to make appointment, however, Kara [name] needs to speak with her sister to find out when she may be available. She will call and schedule follow up office visit.

## 2020-11-01 DIAGNOSIS — G8929 Other chronic pain: Secondary | ICD-10-CM | POA: Diagnosis not present

## 2020-11-01 DIAGNOSIS — M159 Polyosteoarthritis, unspecified: Secondary | ICD-10-CM | POA: Diagnosis not present

## 2020-11-01 DIAGNOSIS — G3184 Mild cognitive impairment, so stated: Secondary | ICD-10-CM | POA: Diagnosis not present

## 2020-11-01 DIAGNOSIS — M25551 Pain in right hip: Secondary | ICD-10-CM | POA: Diagnosis not present

## 2020-11-01 DIAGNOSIS — E46 Unspecified protein-calorie malnutrition: Secondary | ICD-10-CM | POA: Diagnosis not present

## 2020-11-01 DIAGNOSIS — R2681 Unsteadiness on feet: Secondary | ICD-10-CM | POA: Diagnosis not present

## 2020-11-03 DIAGNOSIS — M25551 Pain in right hip: Secondary | ICD-10-CM | POA: Diagnosis not present

## 2020-11-03 DIAGNOSIS — E46 Unspecified protein-calorie malnutrition: Secondary | ICD-10-CM | POA: Diagnosis not present

## 2020-11-03 DIAGNOSIS — M159 Polyosteoarthritis, unspecified: Secondary | ICD-10-CM | POA: Diagnosis not present

## 2020-11-03 DIAGNOSIS — G8929 Other chronic pain: Secondary | ICD-10-CM | POA: Diagnosis not present

## 2020-11-03 DIAGNOSIS — G3184 Mild cognitive impairment, so stated: Secondary | ICD-10-CM | POA: Diagnosis not present

## 2020-11-03 DIAGNOSIS — R2681 Unsteadiness on feet: Secondary | ICD-10-CM | POA: Diagnosis not present

## 2020-11-11 DIAGNOSIS — G3184 Mild cognitive impairment, so stated: Secondary | ICD-10-CM | POA: Diagnosis not present

## 2020-11-11 DIAGNOSIS — M25551 Pain in right hip: Secondary | ICD-10-CM | POA: Diagnosis not present

## 2020-11-11 DIAGNOSIS — G8929 Other chronic pain: Secondary | ICD-10-CM | POA: Diagnosis not present

## 2020-11-11 DIAGNOSIS — M159 Polyosteoarthritis, unspecified: Secondary | ICD-10-CM | POA: Diagnosis not present

## 2020-11-11 DIAGNOSIS — E46 Unspecified protein-calorie malnutrition: Secondary | ICD-10-CM | POA: Diagnosis not present

## 2020-11-11 DIAGNOSIS — R2681 Unsteadiness on feet: Secondary | ICD-10-CM | POA: Diagnosis not present

## 2020-11-15 DIAGNOSIS — M25551 Pain in right hip: Secondary | ICD-10-CM | POA: Diagnosis not present

## 2020-11-15 DIAGNOSIS — G3184 Mild cognitive impairment, so stated: Secondary | ICD-10-CM | POA: Diagnosis not present

## 2020-11-15 DIAGNOSIS — M159 Polyosteoarthritis, unspecified: Secondary | ICD-10-CM | POA: Diagnosis not present

## 2020-11-15 DIAGNOSIS — G8929 Other chronic pain: Secondary | ICD-10-CM | POA: Diagnosis not present

## 2020-11-15 DIAGNOSIS — E46 Unspecified protein-calorie malnutrition: Secondary | ICD-10-CM | POA: Diagnosis not present

## 2020-11-15 DIAGNOSIS — R2681 Unsteadiness on feet: Secondary | ICD-10-CM | POA: Diagnosis not present

## 2020-11-16 ENCOUNTER — Telehealth: Payer: Self-pay

## 2020-11-16 NOTE — Telephone Encounter (Signed)
Copied Noted Sleeved 

## 2020-11-29 ENCOUNTER — Ambulatory Visit (INDEPENDENT_AMBULATORY_CARE_PROVIDER_SITE_OTHER): Payer: Medicare Other

## 2020-11-29 ENCOUNTER — Other Ambulatory Visit: Payer: Self-pay

## 2020-11-29 DIAGNOSIS — Z0000 Encounter for general adult medical examination without abnormal findings: Secondary | ICD-10-CM | POA: Diagnosis not present
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
## 2020-11-29 NOTE — Patient Instructions (Addendum)
Ms. Kara [name] , Thank you for taking time to come for your Medicare Wellness Visit. I appreciate your ongoing commitment to your health goals. Please review the following plan we discussed and let me know if I can assist you in the future.   Screening recommendations/referrals: Colonoscopy: Complete Mammogram: Complete Bone Density: Complete Recommended yearly ophthalmology/optometry visit for glaucoma screening and checkup Recommended yearly dental visit for hygiene and checkup  Vaccinations: Influenza vaccine: Fall 2022 Pneumococcal vaccine: Complete Tdap vaccine: 03/24/24 Shingles vaccine: Complete  Advanced directives: Living Will  Conditions/risks identified: None  Next appointment: No future appointments scheduled at this time.   Preventive Care 52 Years and Older, Female Preventive care refers to lifestyle choices and visits with your health care provider that can promote health and wellness. What does preventive care include?  A yearly physical exam. This is also called an annual well check.  Dental exams once or twice a year.  Routine eye exams. Ask your health care provider how often you should have your eyes checked.  Personal lifestyle choices, including:  Daily care of your teeth and gums.  Regular physical activity.  Eating a healthy diet.  Avoiding tobacco and drug use.  Limiting alcohol use.  Practicing safe sex.  Taking low-dose aspirin every day.  Taking vitamin and mineral supplements as recommended by your health care provider. What happens during an annual well check? The services and screenings done by your health care provider during your annual well check will depend on your age, overall health, lifestyle risk factors, and family history of disease. Counseling  Your health care provider may ask you questions about your:  Alcohol use.  Tobacco use.  Drug use.  Emotional well-being.  Home and relationship well-being.  Sexual  activity.  Eating habits.  History of falls.  Memory and ability to understand (cognition).  Work and work Statistician.  Reproductive health. Screening  You may have the following tests or measurements:  Height, weight, and BMI.  Blood pressure.  Lipid and cholesterol levels. These may be checked every 5 years, or more frequently if you are over 60 years old.  Skin check.  Lung cancer screening. You may have this screening every year starting at age 68 if you have a 30-pack-year history of smoking and currently smoke or have quit within the past 15 years.  Fecal occult blood test (FOBT) of the stool. You may have this test every year starting at age 3.  Flexible sigmoidoscopy or colonoscopy. You may have a sigmoidoscopy every 5 years or a colonoscopy every 10 years starting at age 53.  Hepatitis C blood test.  Hepatitis B blood test.  Sexually transmitted disease (STD) testing.  Diabetes screening. This is done by checking your blood sugar (glucose) after you have not eaten for a while (fasting). You may have this done every 1-3 years.  Bone density scan. This is done to screen for osteoporosis. You may have this done starting at age 47.  Mammogram. This may be done every 1-2 years. Talk to your health care provider about how often you should have regular mammograms. Talk with your health care provider about your test results, treatment options, and if necessary, the need for more tests. Vaccines  Your health care provider may recommend certain vaccines, such as:  Influenza vaccine. This is recommended every year.  Tetanus, diphtheria, and acellular pertussis (Tdap, Td) vaccine. You may need a Td booster every 10 years.  Zoster vaccine. You may need this after age 68.  Pneumococcal 13-valent conjugate (PCV13) vaccine. One dose is recommended after age 83.  Pneumococcal polysaccharide (PPSV23) vaccine. One dose is recommended after age 48. Talk to your health care  provider about which screenings and vaccines you need and how often you need them. This information is not intended to replace advice given to you by your health care provider. Make sure you discuss any questions you have with your health care provider. Document Released: 11/04/2015 Document Revised: 06/27/2016 Document Reviewed: 08/09/2015 Elsevier Interactive Patient Education  2017 [place] Prevention in the Home Falls can cause injuries. They can happen to people of all ages. There are many things you can do to make your home safe and to help prevent falls. What can I do on the outside of my home?  Regularly fix the edges of walkways and driveways and fix any cracks.  Remove anything that might make you trip as you walk through a door, such as a raised step or threshold.  Trim any bushes or trees on the path to your home.  Use bright outdoor lighting.  Clear any walking paths of anything that might make someone trip, such as rocks or tools.  Regularly check to see if handrails are loose or broken. Make sure that both sides of any steps have handrails.  Any raised decks and porches should have guardrails on the edges.  Have any leaves, snow, or ice cleared regularly.  Use sand or salt on walking paths during winter.  Clean up any spills in your garage right away. This includes oil or grease spills. What can I do in the bathroom?  Use night lights.  Install grab bars by the toilet and in the tub and shower. Do not use towel bars as grab bars.  Use non-skid mats or decals in the tub or shower.  If you need to sit down in the shower, use a plastic, non-slip stool.  Keep the floor dry. Clean up any water that spills on the floor as soon as it happens.  Remove soap buildup in the tub or shower regularly.  Attach bath mats securely with double-sided non-slip rug tape.  Do not have throw rugs and other things on the floor that can make you trip. What can I do in  the bedroom?  Use night lights.  Make sure that you have a light by your bed that is easy to reach.  Do not use any sheets or blankets that are too big for your bed. They should not hang down onto the floor.  Have a firm chair that has side arms. You can use this for support while you get dressed.  Do not have throw rugs and other things on the floor that can make you trip. What can I do in the kitchen?  Clean up any spills right away.  Avoid walking on wet floors.  Keep items that you use a lot in easy-to-reach places.  If you need to reach something above you, use a strong step stool that has a grab bar.  Keep electrical cords out of the way.  Do not use floor polish or wax that makes floors slippery. If you must use wax, use non-skid floor wax.  Do not have throw rugs and other things on the floor that can make you trip. What can I do with my stairs?  Do not leave any items on the stairs.  Make sure that there are handrails on both sides of the stairs and use them.  Fix handrails that are broken or loose. Make sure that handrails are as long as the stairways.  Check any carpeting to make sure that it is firmly attached to the stairs. Fix any carpet that is loose or worn.  Avoid having throw rugs at the top or bottom of the stairs. If you do have throw rugs, attach them to the floor with carpet tape.  Make sure that you have a light switch at the top of the stairs and the bottom of the stairs. If you do not have them, ask someone to add them for you. What else can I do to help prevent falls?  Wear shoes that:  Do not have high heels.  Have rubber bottoms.  Are comfortable and fit you well.  Are closed at the toe. Do not wear sandals.  If you use a stepladder:  Make sure that it is fully opened. Do not climb a closed stepladder.  Make sure that both sides of the stepladder are locked into place.  Ask someone to hold it for you, if possible.  Clearly mark and  make sure that you can see:  Any grab bars or handrails.  First and last steps.  Where the edge of each step is.  Use tools that help you move around (mobility aids) if they are needed. These include:  Canes.  Walkers.  Scooters.  Crutches.  Turn on the lights when you go into a dark area. Replace any light bulbs as soon as they burn out.  Set up your furniture so you have a clear path. Avoid moving your furniture around.  If any of your floors are uneven, fix them.  If there are any pets around you, be aware of where they are.  Review your medicines with your doctor. Some medicines can make you feel dizzy. This can increase your chance of falling. Ask your doctor what other things that you can do to help prevent falls. This information is not intended to replace advice given to you by your health care provider. Make sure you discuss any questions you have with your health care provider. Document Released: 08/04/2009 Document Revised: 03/15/2016 Document Reviewed: 11/12/2014 Elsevier Interactive Patient Education  2017 [employer].

## 2020-11-30 ENCOUNTER — Ambulatory Visit: Payer: Medicare Other | Admitting: Family Medicine

## 2020-12-28 DIAGNOSIS — M533 Sacrococcygeal disorders, not elsewhere classified: Secondary | ICD-10-CM | POA: Diagnosis not present

## 2020-12-28 DIAGNOSIS — F419 Anxiety disorder, unspecified: Secondary | ICD-10-CM | POA: Diagnosis not present

## 2020-12-28 DIAGNOSIS — M79604 Pain in right leg: Secondary | ICD-10-CM | POA: Diagnosis not present

## 2020-12-28 DIAGNOSIS — M5459 Other low back pain: Secondary | ICD-10-CM | POA: Diagnosis not present

## 2020-12-28 DIAGNOSIS — M25551 Pain in right hip: Secondary | ICD-10-CM | POA: Diagnosis not present

## 2020-12-28 DIAGNOSIS — G8928 Other chronic postprocedural pain: Secondary | ICD-10-CM | POA: Diagnosis not present

## 2020-12-28 DIAGNOSIS — M130 Polyarthritis, unspecified: Secondary | ICD-10-CM | POA: Diagnosis not present

## 2020-12-28 DIAGNOSIS — M5416 Radiculopathy, lumbar region: Secondary | ICD-10-CM | POA: Diagnosis not present

## 2020-12-28 DIAGNOSIS — M8000XD Age-related osteoporosis with current pathological fracture, unspecified site, subsequent encounter for fracture with routine healing: Secondary | ICD-10-CM | POA: Diagnosis not present

## 2021-01-28 ENCOUNTER — Other Ambulatory Visit: Payer: Self-pay | Admitting: Family Medicine

## 2021-01-28 DIAGNOSIS — K219 Gastro-esophageal reflux disease without esophagitis: Secondary | ICD-10-CM

## 2021-03-09 ENCOUNTER — Telehealth: Payer: Self-pay

## 2021-03-09 NOTE — Telephone Encounter (Signed)
Kara [name] is in need of a letter, to be written that she is taking care of her mother and she should be excused from [employer]   [phone]

## 2021-03-13 NOTE — Telephone Encounter (Signed)
Pls write a letter as requested, include Rhonda's dOB and relationship ro Queen in te letter, thanks you

## 2021-03-13 NOTE — Telephone Encounter (Signed)
[employer] letter typed

## 2021-03-14 ENCOUNTER — Telehealth: Payer: Self-pay

## 2021-03-14 NOTE — Telephone Encounter (Signed)
PCS forms  Noted  Copied  Sleeved  

## 2021-03-14 NOTE — Telephone Encounter (Signed)
Cap forms  Noted Copied Sleeved

## 2021-03-21 ENCOUNTER — Other Ambulatory Visit: Payer: Self-pay

## 2021-03-21 ENCOUNTER — Telehealth (INDEPENDENT_AMBULATORY_CARE_PROVIDER_SITE_OTHER): Payer: Medicare Other | Admitting: Family Medicine

## 2021-03-21 DIAGNOSIS — G8929 Other chronic pain: Secondary | ICD-10-CM

## 2021-03-21 DIAGNOSIS — F419 Anxiety disorder, unspecified: Secondary | ICD-10-CM | POA: Diagnosis not present

## 2021-03-21 DIAGNOSIS — M545 Low back pain, unspecified: Secondary | ICD-10-CM | POA: Diagnosis not present

## 2021-03-21 DIAGNOSIS — Z742 Need for assistance at home and no other household member able to render care: Secondary | ICD-10-CM | POA: Diagnosis not present

## 2021-03-21 DIAGNOSIS — E785 Hyperlipidemia, unspecified: Secondary | ICD-10-CM | POA: Diagnosis not present

## 2021-03-21 DIAGNOSIS — M1990 Unspecified osteoarthritis, unspecified site: Secondary | ICD-10-CM

## 2021-03-21 DIAGNOSIS — R946 Abnormal results of thyroid function studies: Secondary | ICD-10-CM | POA: Diagnosis not present

## 2021-03-21 NOTE — Progress Notes (Signed)
Virtual Visit via video  I connected with Kara [name] on 45/80/99 at  9:20 AM EDT by video   Location: Patient: home with daughter Kara [name] , living with her daughter for recent  4 weeks Provider: office      History of Present Illness: F/u chronic problems. Has back pain where bones protrude, unable to lie on her side, so has to lie on back, sometimes pain in the back keeps her up  Has heel sore on left , no skin breakdown, using topical  Appetite is good, bowel movements are good  no urinary symptoms, no cough or runny nose. Poor eye sight so needs to be fed by her family Dementia increasing , not recognizing her children x 7 months seeing things on the wall,sometimes calls her sister Kara [name] as a man Unable to feed herself, needs assistance with all ADL's, high fall risk and minimally mobile, incapable of independent mobility Needs paperwork from [employer] to get in home help for 80 hrs / month, in [place] was on this with [facility] in [place]. Needs  assistant iwith ADL's but can be managed  At home Observations/Objective: There were no vitals taken for this visit. Fair  communication with mild  confusion No signs of respiratory distress during speech Alert      Assessment and Plan:  Arthritis Generalized osteoarthritis, limited mobility and high fall risk, needs assistance with all ADL's    Back pain Chronic management through pain clinic  Hyperlipidemia LDL goal <100 Hyperlipidemia:Low fat diet discussed and encouraged.   Lipid Panel  Lab Results  Component Value Date   CHOL 204 (H) 05/16/2020   HDL 64 05/16/2020   LDLCALC 111 (H) 05/16/2020   TRIG 175 (H) 05/16/2020   CHOLHDL 3.2 05/16/2020   Updated lab needed at/ before next visit. Needs to reduce fat in diet    Anxiety Bedtime klonopin prescribed by pain management  Abnormal thyroid function test Needs updated lab, non compliant with replacement  Need for home health care Incapable of  independent living and incapable of carrying out ADL's.Best to be at home with in home with in home help, for assistance with feeding, bathing, dressing and mobility   Follow Up Instructions:    I discussed the assessment and treatment plan with the patient. The patient was provided an opportunity to ask questions and all were answered. The patient agreed with the plan and demonstrated an understanding of the instructions.   The patient was advised to call back or seek an in-person evaluation if the symptoms worsen or if the condition fails to improve as anticipated.  I provided 22 minutes of non-face-to-face time during this encounter.   [name], MD

## 2021-03-21 NOTE — Patient Instructions (Signed)
F/Un in office with MD in 6 weeks, call if you need me before  Paperwork for [employer] in home help to be addressed in next 24 hours   TSH, test this week if possible  Thanks for choosing [facility], we consider it a privelige to serve you.

## 2021-03-22 DIAGNOSIS — M5416 Radiculopathy, lumbar region: Secondary | ICD-10-CM | POA: Diagnosis not present

## 2021-03-22 DIAGNOSIS — F419 Anxiety disorder, unspecified: Secondary | ICD-10-CM | POA: Diagnosis not present

## 2021-03-22 DIAGNOSIS — M130 Polyarthritis, unspecified: Secondary | ICD-10-CM | POA: Diagnosis not present

## 2021-03-22 DIAGNOSIS — M8000XD Age-related osteoporosis with current pathological fracture, unspecified site, subsequent encounter for fracture with routine healing: Secondary | ICD-10-CM | POA: Diagnosis not present

## 2021-03-22 DIAGNOSIS — M533 Sacrococcygeal disorders, not elsewhere classified: Secondary | ICD-10-CM | POA: Diagnosis not present

## 2021-03-22 DIAGNOSIS — G8928 Other chronic postprocedural pain: Secondary | ICD-10-CM | POA: Diagnosis not present

## 2021-03-22 DIAGNOSIS — M5459 Other low back pain: Secondary | ICD-10-CM | POA: Diagnosis not present

## 2021-03-22 DIAGNOSIS — M79604 Pain in right leg: Secondary | ICD-10-CM | POA: Diagnosis not present

## 2021-03-22 DIAGNOSIS — M25551 Pain in right hip: Secondary | ICD-10-CM | POA: Diagnosis not present

## 2021-03-23 ENCOUNTER — Telehealth: Payer: Medicare Other | Admitting: Family Medicine

## 2021-03-23 NOTE — Telephone Encounter (Signed)
complete

## 2021-03-26 ENCOUNTER — Encounter: Payer: Self-pay | Admitting: Family Medicine

## 2021-03-26 NOTE — Assessment & Plan Note (Signed)
Generalized osteoarthritis, limited mobility and high fall risk, needs assistance with all ADL's

## 2021-03-26 NOTE — Assessment & Plan Note (Signed)
Hyperlipidemia:Low fat diet discussed and encouraged.   Lipid Panel  Lab Results  Component Value Date   CHOL 204 (H) 05/16/2020   HDL 64 05/16/2020   LDLCALC 111 (H) 05/16/2020   TRIG 175 (H) 05/16/2020   CHOLHDL 3.2 05/16/2020   Updated lab needed at/ before next visit. Needs to reduce fat in diet

## 2021-03-26 NOTE — Assessment & Plan Note (Signed)
Needs updated lab, non compliant with replacement

## 2021-03-26 NOTE — Assessment & Plan Note (Signed)
Chronic management through pain clinic

## 2021-03-26 NOTE — Assessment & Plan Note (Signed)
Incapable of independent living and incapable of carrying out ADL's.Best to be at home with in home with in home help, for assistance with feeding, bathing, dressing and mobility

## 2021-03-26 NOTE — Assessment & Plan Note (Signed)
Bedtime klonopin prescribed by pain management

## 2021-04-25 ENCOUNTER — Telehealth: Payer: Self-pay

## 2021-04-25 NOTE — Telephone Encounter (Signed)
Patient daughter called said patient insurance company rejected Kara [name], Needs to get through [facility] their contact # [phone].   Patient been waiting on this for several months. Patient daughter call back # [phone]

## 2021-04-27 ENCOUNTER — Telehealth: Payer: Self-pay

## 2021-04-27 NOTE — Telephone Encounter (Signed)
Copied Sleeved Noted Section D incomplete

## 2021-04-27 NOTE — Telephone Encounter (Signed)
error 

## 2021-05-01 NOTE — Telephone Encounter (Signed)
Put back in dr simpsons box to have section d completed

## 2021-05-02 ENCOUNTER — Ambulatory Visit: Payer: Medicare Other | Admitting: Family Medicine

## 2021-05-04 ENCOUNTER — Other Ambulatory Visit (INDEPENDENT_AMBULATORY_CARE_PROVIDER_SITE_OTHER): Payer: Medicare Other

## 2021-05-04 DIAGNOSIS — R350 Frequency of micturition: Secondary | ICD-10-CM

## 2021-05-04 DIAGNOSIS — Z0279 Encounter for issue of other medical certificate: Secondary | ICD-10-CM

## 2021-05-04 LAB — POCT URINALYSIS DIPSTICK
Bilirubin, UA: NEGATIVE
Blood, UA: NEGATIVE
Glucose, UA: NEGATIVE
Ketones, UA: NEGATIVE
Nitrite, UA: NEGATIVE
Protein, UA: NEGATIVE
Spec Grav, UA: 1.015 (ref 1.010–1.025)
Urobilinogen, UA: 0.2 E.U./dL
pH, UA: 7.5 (ref 5.0–8.0)

## 2021-05-07 LAB — URINE CULTURE

## 2021-05-08 ENCOUNTER — Telehealth: Payer: Self-pay | Admitting: *Deleted

## 2021-05-08 NOTE — Telephone Encounter (Signed)
Pt daughter called wanting updates on urine culture

## 2021-05-09 ENCOUNTER — Other Ambulatory Visit: Payer: Self-pay

## 2021-05-09 ENCOUNTER — Encounter: Payer: Self-pay | Admitting: Family Medicine

## 2021-05-09 ENCOUNTER — Telehealth (INDEPENDENT_AMBULATORY_CARE_PROVIDER_SITE_OTHER): Payer: Medicare Other | Admitting: Family Medicine

## 2021-05-09 VITALS — BP 139/69 | HR 80

## 2021-05-09 DIAGNOSIS — G3184 Mild cognitive impairment, so stated: Secondary | ICD-10-CM

## 2021-05-09 DIAGNOSIS — M159 Polyosteoarthritis, unspecified: Secondary | ICD-10-CM

## 2021-05-09 DIAGNOSIS — R2681 Unsteadiness on feet: Secondary | ICD-10-CM

## 2021-05-09 DIAGNOSIS — E785 Hyperlipidemia, unspecified: Secondary | ICD-10-CM | POA: Diagnosis not present

## 2021-05-09 DIAGNOSIS — R350 Frequency of micturition: Secondary | ICD-10-CM

## 2021-05-09 DIAGNOSIS — R7302 Impaired glucose tolerance (oral): Secondary | ICD-10-CM | POA: Diagnosis not present

## 2021-05-09 DIAGNOSIS — E559 Vitamin D deficiency, unspecified: Secondary | ICD-10-CM

## 2021-05-09 MED ORDER — NITROFURANTOIN MONOHYD MACRO 100 MG PO CAPS: 100.0000 mg | ORAL_CAPSULE | Freq: Two times a day (BID) | ORAL | 0 refills | Status: DC

## 2021-05-09 MED ORDER — DOXYCYCLINE HYCLATE 100 MG PO TABS: 100.0000 mg | ORAL_TABLET | Freq: Two times a day (BID) | ORAL | 0 refills | Status: DC

## 2021-05-09 NOTE — Assessment & Plan Note (Signed)
Increasingly debilitating markedly reduced mobility

## 2021-05-09 NOTE — Assessment & Plan Note (Addendum)
Abn CCUA and recent increase in confusion , treat presumptively with 3 day antibiotic course

## 2021-05-09 NOTE — Telephone Encounter (Signed)
Completed and faxed.

## 2021-05-09 NOTE — Telephone Encounter (Signed)
She has appt today to review this

## 2021-05-09 NOTE — Patient Instructions (Addendum)
Office visit next week as scheduled, mMSE at visit, visit is for hospital bed  3 day course of nitrofurantoin prescribed for possible UTI  Please get non fasting  cmp and EGFr, TSH , cBC and vit D 3 to 5 days before visit if possible  Careful not to fall  Thanks for choosing [facility], we consider it a privelige to serve you.

## 2021-05-09 NOTE — Progress Notes (Signed)
Virtual Visit via Telephone Note  I connected with Kara [name] on 27/74/12 at  2:00 PM EDT by telephone and verified that I am speaking with the correct person using two identifiers.  Location: Patient: home  Provider: office   I discussed the limitations, risks, security and privacy concerns of performing an evaluation and management service by telephone and the availability of in person appointments. I also discussed with the patient that there may be a patient responsible charge related to this service. The patient expressed understanding and agreed to proceed.   History of Present Illness: Kara [name] her daughter who she lives with  states that over the past 2 weeks she  appears to be somewhat confused and irritable, so brought in urine last week for testing for uzti and this is a follow up. Denies fevr, nausea , change in appetite or flank pain Remains essentially immobile d ue to severe arthritis, has upcoming appt in office for hospital bed I did speak with Nachelle at end of visit, she had been sleeping and was awakened so not very talkative   Observations/Objective:  BP 139/69   Pulse 80  Good communication with no confusion and intact memory. Alert and oriented x 3 No signs of respiratory distress during speech  Assessment and Plan: Urinary frequency Abn CCUA and recent increase in confusion , treat presumptively with 3 day antibiotic course  Unsteady gait    MCI (mild cognitive impairment) Reassess memory at visit next week  Generalized osteoarthritis Increasingly debilitating markedly reduced mobility   Follow Up Instructions:    I discussed the assessment and treatment plan with the patient. The patient was provided an opportunity to ask questions and all were answered. The patient agreed with the plan and demonstrated an understanding of the instructions.   The patient was advised to call back or seek an in-person evaluation if the symptoms worsen or if the  condition fails to improve as anticipated.  I provided 8 minutes of non-face-to-face time during this encounter.   [name], MD

## 2021-05-09 NOTE — Assessment & Plan Note (Signed)
Reassess memory at visit next week

## 2021-05-12 ENCOUNTER — Telehealth: Payer: Self-pay

## 2021-05-12 DIAGNOSIS — R52 Pain, unspecified: Secondary | ICD-10-CM | POA: Diagnosis not present

## 2021-05-12 DIAGNOSIS — R4182 Altered mental status, unspecified: Secondary | ICD-10-CM | POA: Diagnosis not present

## 2021-05-12 DIAGNOSIS — M545 Low back pain, unspecified: Secondary | ICD-10-CM | POA: Diagnosis not present

## 2021-05-12 DIAGNOSIS — M549 Dorsalgia, unspecified: Secondary | ICD-10-CM | POA: Diagnosis not present

## 2021-05-12 DIAGNOSIS — R0902 Hypoxemia: Secondary | ICD-10-CM | POA: Diagnosis not present

## 2021-05-12 NOTE — Telephone Encounter (Signed)
Pt daughter informed

## 2021-05-12 NOTE — Telephone Encounter (Signed)
Patient daughter Kara [name] called saying her mother is running a fever and possible UTI.  Having trouble swallowing and appetite.  Need to use something different along with her antibiotic she is taking.  Call back # [phone].

## 2021-05-15 NOTE — Telephone Encounter (Signed)
Patient daughter called asked to refax forms to [employer].  Faxed forms.

## 2021-05-16 ENCOUNTER — Other Ambulatory Visit: Payer: Self-pay

## 2021-05-16 ENCOUNTER — Encounter: Payer: Self-pay | Admitting: Family Medicine

## 2021-05-16 ENCOUNTER — Ambulatory Visit (INDEPENDENT_AMBULATORY_CARE_PROVIDER_SITE_OTHER): Payer: Medicare Other | Admitting: Family Medicine

## 2021-05-16 VITALS — BP 126/66 | HR 85 | Resp 15

## 2021-05-16 DIAGNOSIS — R7301 Impaired fasting glucose: Secondary | ICD-10-CM | POA: Diagnosis not present

## 2021-05-16 DIAGNOSIS — M159 Polyosteoarthritis, unspecified: Secondary | ICD-10-CM | POA: Diagnosis not present

## 2021-05-16 DIAGNOSIS — L97421 Non-pressure chronic ulcer of left heel and midfoot limited to breakdown of skin: Secondary | ICD-10-CM | POA: Diagnosis not present

## 2021-05-16 DIAGNOSIS — E785 Hyperlipidemia, unspecified: Secondary | ICD-10-CM | POA: Diagnosis not present

## 2021-05-16 DIAGNOSIS — R238 Other skin changes: Secondary | ICD-10-CM | POA: Diagnosis not present

## 2021-05-16 DIAGNOSIS — D539 Nutritional anemia, unspecified: Secondary | ICD-10-CM | POA: Diagnosis not present

## 2021-05-16 DIAGNOSIS — Z741 Need for assistance with personal care: Secondary | ICD-10-CM | POA: Diagnosis not present

## 2021-05-16 DIAGNOSIS — E559 Vitamin D deficiency, unspecified: Secondary | ICD-10-CM | POA: Diagnosis not present

## 2021-05-16 NOTE — Patient Instructions (Signed)
F/U in 6 months, call if you need me sooner  I will prescribe the prilosec as before, discuss other medication with Dr Merlene Laughter  Please add magnesium level to lab order  I will prescribe hospital bed  We will reach out to [facility] re your need for 80 hours per week instead of 40  Barrier cream will be sent for skin protection for heel and back, need to keep heel off bed by propping on pillow as much as possible, and turn from side to side to reduce skin irritation on the back  Needs supervision 24 hrs/ day  Thanks for choosing [facility], we consider it a privelige to serve you.

## 2021-05-17 ENCOUNTER — Telehealth: Payer: Self-pay | Admitting: Family Medicine

## 2021-05-17 LAB — CMP14+EGFR
ALT: 12 IU/L (ref 0–32)
AST: 23 IU/L (ref 0–40)
Albumin/Globulin Ratio: 1.3 (ref 1.2–2.2)
Albumin: 3.3 g/dL — ABNORMAL LOW (ref 3.5–4.6)
Alkaline Phosphatase: 68 IU/L (ref 44–121)
BUN/Creatinine Ratio: 16 (ref 12–28)
BUN: 10 mg/dL (ref 10–36)
Bilirubin Total: <0.2 mg/dL (ref 0.0–1.2)
CO2: 25 mmol/L (ref 20–29)
Calcium: 8.7 mg/dL (ref 8.7–10.3)
Chloride: 101 mmol/L (ref 96–106)
Creatinine, Ser: 0.63 mg/dL (ref 0.57–1.00)
Globulin, Total: 2.5 g/dL (ref 1.5–4.5)
Glucose: 140 mg/dL — ABNORMAL HIGH (ref 65–99)
Potassium: 3.7 mmol/L (ref 3.5–5.2)
Sodium: 139 mmol/L (ref 134–144)
Total Protein: 5.8 g/dL — ABNORMAL LOW (ref 6.0–8.5)
eGFR: 84 mL/min/1.73

## 2021-05-17 LAB — CBC
Hematocrit: 28.4 % — ABNORMAL LOW (ref 34.0–46.6)
Hemoglobin: 9.8 g/dL — ABNORMAL LOW (ref 11.1–15.9)
MCH: 31.7 pg (ref 26.6–33.0)
MCHC: 34.5 g/dL (ref 31.5–35.7)
MCV: 92 fL (ref 79–97)
Platelets: 224 10*3/uL (ref 150–450)
RBC: 3.09 x10E6/uL — ABNORMAL LOW (ref 3.77–5.28)
RDW: 17.2 % — ABNORMAL HIGH (ref 11.7–15.4)
WBC: 7 10*3/uL (ref 3.4–10.8)

## 2021-05-17 LAB — TSH: TSH: 4.86 u[IU]/mL — ABNORMAL HIGH (ref 0.450–4.500)

## 2021-05-17 LAB — VITAMIN D 25 HYDROXY (VIT D DEFICIENCY, FRACTURES): Vit D, 25-Hydroxy: 109 ng/mL — ABNORMAL HIGH (ref 30.0–100.0)

## 2021-05-17 NOTE — Telephone Encounter (Signed)
PT is calling states that she did not receive the medication

## 2021-05-18 ENCOUNTER — Other Ambulatory Visit: Payer: Self-pay

## 2021-05-18 DIAGNOSIS — K219 Gastro-esophageal reflux disease without esophagitis: Secondary | ICD-10-CM

## 2021-05-18 MED ORDER — OMEPRAZOLE 20 MG PO CPDR: DELAYED_RELEASE_CAPSULE | ORAL | 1 refills | Status: DC

## 2021-05-18 MED ORDER — NITROFURANTOIN MONOHYD MACRO 100 MG PO CAPS: 100.0000 mg | ORAL_CAPSULE | Freq: Two times a day (BID) | ORAL | 0 refills | Status: DC

## 2021-05-18 NOTE — Telephone Encounter (Signed)
Sent in the omeprazole

## 2021-05-19 ENCOUNTER — Other Ambulatory Visit: Payer: Self-pay

## 2021-05-19 DIAGNOSIS — M159 Polyosteoarthritis, unspecified: Secondary | ICD-10-CM

## 2021-05-19 DIAGNOSIS — M7061 Trochanteric bursitis, right hip: Secondary | ICD-10-CM

## 2021-05-19 MED ORDER — UNABLE TO FIND: 0 refills | Status: DC

## 2021-05-19 NOTE — Progress Notes (Signed)
Iron added- could not get anyone on the phone to check if she had eaten so added a1c

## 2021-05-22 ENCOUNTER — Telehealth: Payer: Self-pay | Admitting: Family Medicine

## 2021-05-22 NOTE — Telephone Encounter (Signed)
Please call [name] in regards to the medication and bed that was supposed to be called in

## 2021-05-23 ENCOUNTER — Other Ambulatory Visit: Payer: Self-pay

## 2021-05-23 ENCOUNTER — Telehealth: Payer: Self-pay

## 2021-05-23 LAB — HGB A1C W/O EAG: Hgb A1c MFr Bld: 5.4 % (ref 4.8–5.6)

## 2021-05-23 LAB — IRON: Iron: 41 ug/dL (ref 27–139)

## 2021-05-23 LAB — SPECIMEN STATUS REPORT

## 2021-05-23 MED ORDER — UNABLE TO FIND: 0 refills | Status: DC

## 2021-05-23 NOTE — Telephone Encounter (Signed)
Orders for medication prescribed has been sent in. Waiting on note closure to fax with hospital bed orders.

## 2021-05-23 NOTE — Progress Notes (Signed)
Kara [name]     MRN: [id number]      DOB: [birth year]   HPI Kara [name] is here for a face to face visit for a hospital bed .  She is being evaluated today because of skin breakdown/ ulcers on her left heel and lower back, and because of chronic  arthritic pain with increasingly limited mobility. She is in bed 90 % of the time, and essentially just goes to the bathroom. She experiences pain for arthritis all the time and is unable to turn in the bed  without assistance. She is at high risk for aspiration, as she has a large hiatal hernia  Kara [name] requires positioning of her body in ways not feasible with an ordinary bed in order to alleviate her chronic severe arthritic pai, and she alos needs the head of the bed to be elevated more than 30 degrees most of the time due to risk of aspiration. Kara [name] requires frequent changes in body position to reduce skin breakdown , and she also has an immediate need for a change in position fdrom time to time because of paion Trapeze equipment is necessary to assist in changes in body position A bed cradle is needed to to prevent contact with bed coverings, therby reducing skin irritation Side rails  or a safety enclosure is required to reduce the risk of her getting out of bed on her own and placing her at  increased  her risk of falls  Currently getting 40 hrs/ week of help and has been found wandering at night out of bed. Unable to safely move about, high fracture risk with a fall, and also has dementia, needs increased number of hours, qualifies for 80 hrs/ week as she need assistance in all ADL's and is unsafe to be left on her own ROS Denies recent fever or chills. Denies sinus pressure, nasal congestion, ear pain or sore throat. Denies chest congestion, productive cough or wheezing. Denies chest pains, palpitations and leg swelling Chronic generalized debilitating joint pain with immobility Denies headaches, seizures, numbness, or tingling. C/o  depression, unable to function as she ussed to and chronic pain, also has anxiety C/o left heel sore and lower back skin irritation PE  BP 126/66   Pulse 85   Resp 15   SpO2 94%   Patient  drowsy and in no cardiopulmonary distress.Appears under nourished  HEENT: No facial asymmetry, EOMI,     Neck decreased rOM .  Chest: Clear to auscultation bilaterally.  CVS: RRR.  ABD: Soft non tender.   Ext: No edema  Kara: Markedly reduced  ROM spine, shoulders, hips and knees.  Skin: abrasion to left heel, no drainage or erythema, healing ulcer , max diameter approx 1.5 cm. Erythema of lower sacrum, no drainage or skin breakdown  Psych: Poor eye contact, flat  affect. tact not anxious or depressed appearing.     Assessment & Plan  Generalized osteoarthritis Debilitating generalized osteoarthritis, essentially immobile and bed ridden, in need of semi electric hospital bed for frequent changes in position to help to alleviate pain  and to reduce skin breakdown and reduce aspiration risk Also needs trapeze  Equipment , bed cradle, side rails  Heel ulcer (HCC) Left heel ulcer stage 1, no superinfection, needs to elevate legs off from bed and needs barrier cream for ulcer on heel and skin irritation on lower back  Irritation symptom of skin milds erythema of sacral area, skin intact, needs topical  Barrier  Cream  twice daily as needed, and also frequent changes in position  Requires daily assistance for activities of daily living (ADL) and comfort needs Severe generalized arthritis, limited mobility, which is unsafe independently, and incapable of ADL's independently needs 80 hrs/ week assistance at least

## 2021-05-23 NOTE — Telephone Encounter (Signed)
Refaxed buttpaste rx to [facility] and aware that as soon as note is signed will fax to [facility]

## 2021-05-23 NOTE — Telephone Encounter (Signed)
The medication (for heartburn) was sent 7/28. I am waiting for the OV note to be closed before I can fax the order for the hospital bed

## 2021-05-23 NOTE — Telephone Encounter (Signed)
Please call [name] in regards to the medication and bed that was supposed to be called in  ph# [phone] it was supposed to go to [employer]

## 2021-05-24 ENCOUNTER — Encounter: Payer: Self-pay | Admitting: Family Medicine

## 2021-05-24 ENCOUNTER — Other Ambulatory Visit: Payer: Self-pay

## 2021-05-24 DIAGNOSIS — M130 Polyarthritis, unspecified: Secondary | ICD-10-CM | POA: Diagnosis not present

## 2021-05-24 DIAGNOSIS — R238 Other skin changes: Secondary | ICD-10-CM | POA: Insufficient documentation

## 2021-05-24 DIAGNOSIS — G8928 Other chronic postprocedural pain: Secondary | ICD-10-CM | POA: Diagnosis not present

## 2021-05-24 DIAGNOSIS — M25551 Pain in right hip: Secondary | ICD-10-CM | POA: Diagnosis not present

## 2021-05-24 DIAGNOSIS — M533 Sacrococcygeal disorders, not elsewhere classified: Secondary | ICD-10-CM | POA: Diagnosis not present

## 2021-05-24 DIAGNOSIS — F419 Anxiety disorder, unspecified: Secondary | ICD-10-CM | POA: Diagnosis not present

## 2021-05-24 DIAGNOSIS — M5416 Radiculopathy, lumbar region: Secondary | ICD-10-CM | POA: Diagnosis not present

## 2021-05-24 DIAGNOSIS — L97409 Non-pressure chronic ulcer of unspecified heel and midfoot with unspecified severity: Secondary | ICD-10-CM | POA: Insufficient documentation

## 2021-05-24 DIAGNOSIS — Z741 Need for assistance with personal care: Secondary | ICD-10-CM | POA: Insufficient documentation

## 2021-05-24 DIAGNOSIS — M79604 Pain in right leg: Secondary | ICD-10-CM | POA: Diagnosis not present

## 2021-05-24 DIAGNOSIS — M8000XD Age-related osteoporosis with current pathological fracture, unspecified site, subsequent encounter for fracture with routine healing: Secondary | ICD-10-CM | POA: Diagnosis not present

## 2021-05-24 DIAGNOSIS — M5459 Other low back pain: Secondary | ICD-10-CM | POA: Diagnosis not present

## 2021-05-24 MED ORDER — LEVOTHYROXINE SODIUM 25 MCG PO TABS: 25.0000 ug | ORAL_TABLET | Freq: Every day | ORAL | 3 refills | Status: DC

## 2021-05-24 NOTE — Assessment & Plan Note (Signed)
Left heel ulcer stage 1, no superinfection, needs to elevate legs off from bed and needs barrier cream for ulcer on heel and skin irritation on lower back

## 2021-05-24 NOTE — Assessment & Plan Note (Signed)
Severe generalized arthritis, limited mobility, which is unsafe independently, and incapable of ADL's independently needs 80 hrs/ week assistance at least

## 2021-05-24 NOTE — Assessment & Plan Note (Signed)
Debilitating generalized osteoarthritis, essentially immobile and bed ridden, in need of semi electric hospital bed for frequent changes in position to help to alleviate pain  and to reduce skin breakdown and reduce aspiration risk Also needs trapeze  Equipment , bed cradle, side rails

## 2021-05-24 NOTE — Assessment & Plan Note (Signed)
milds erythema of sacral area, skin intact, needs topical  Barrier  Cream twice daily as needed, and also frequent changes in position

## 2021-06-14 ENCOUNTER — Telehealth: Payer: Self-pay

## 2021-06-14 NOTE — Telephone Encounter (Signed)
Patient daughter called [name].  Patient refuses eating, drinking or taking no Medication.  Yesterday she was eating and drinking fine, today is not.  She keeps calling her.

## 2021-06-14 NOTE — Telephone Encounter (Signed)
Pt last here 7/26. Please advise

## 2021-06-15 NOTE — Telephone Encounter (Signed)
Daughter aware to take her to the ER

## 2021-06-22 ENCOUNTER — Ambulatory Visit (INDEPENDENT_AMBULATORY_CARE_PROVIDER_SITE_OTHER): Payer: Medicare Other | Admitting: Family Medicine

## 2021-06-22 ENCOUNTER — Other Ambulatory Visit: Payer: Self-pay

## 2021-06-22 ENCOUNTER — Encounter: Payer: Self-pay | Admitting: Family Medicine

## 2021-06-22 DIAGNOSIS — K529 Noninfective gastroenteritis and colitis, unspecified: Secondary | ICD-10-CM | POA: Diagnosis not present

## 2021-06-22 MED ORDER — ONDANSETRON HCL 4 MG PO TABS: ORAL_TABLET | ORAL | 0 refills | Status: DC

## 2021-06-22 NOTE — Patient Instructions (Signed)
Follow-up as before call if you need me sooner.  I have prescribed a limited amount of Zofran for nausea.  You are treated for acute gastroenteritis.  Use Imodium maximum 2 tablets in 24 hours as needed for loose stool.  It is important that you keep hydrated., so drink small quantities of liquid often.   Reintroduce solid foods slowly as tolerated .We recommend the Brat diet which includes bananas,  rice,  applesauce and toast once you start eating regularly to begin with  Thanks for choosing [facility], we consider it a privelige to serve you.

## 2021-06-22 NOTE — Progress Notes (Signed)
Virtual Visit via Telephone Note  I connected with [name], daughter of JECENIA SEAVEY on [id number] at  1:00 PM EDT by telephone and verified that I am speaking with the correct person using two identifiers.  Location: Patient: home Provider: office   I discussed the limitations, risks, security and privacy concerns of performing an evaluation and management service by telephone and the availability of in person appointments. I also discussed with the patient that there may be a patient responsible charge related to this service. The patient expressed understanding and agreed to proceed.   History of Present Illness: 2 day h/o abdominal pain and diarrhea, initially soft , today more watery, has had 2 BM already, yesterday 3 total , 1 firm, no nausea or vomit, had cramping abdominal pain  No fever or chills, no other family member is ill Observations/Objective:  There were no vitals taken for this visit. Historian is daughter, I di not directly communicate with Adileny Assessment and Plan: Acute gastroenteritis zofran in limited amount for as needed use, continue immodium sparingly, as needed, ensure adequate hydration and bRAT diet   Follow Up Instructions:    I discussed the assessment and treatment plan with the patient. The patient was provided an opportunity to ask questions and all were answered. The patient agreed with the plan and demonstrated an understanding of the instructions.   The patient was advised to call back or seek an in-person evaluation if the symptoms worsen or if the condition fails to improve as anticipated.  I provided 8  minutes of non-face-to-face time during this encounter.   [name], MD

## 2021-06-26 ENCOUNTER — Encounter: Payer: Self-pay | Admitting: Family Medicine

## 2021-06-26 NOTE — Assessment & Plan Note (Signed)
zofran in limited amount for as needed use, continue immodium sparingly, as needed, ensure adequate hydration and bRAT diet

## 2021-07-04 ENCOUNTER — Inpatient Hospital Stay (HOSPITAL_COMMUNITY)
Admission: EM | Admit: 2021-07-04 | Discharge: 2021-07-06 | DRG: 871 | Disposition: A | Discharge status: Home Health | Payer: Medicare Other | Attending: Internal Medicine | Admitting: Internal Medicine

## 2021-07-04 ENCOUNTER — Emergency Department (HOSPITAL_COMMUNITY): Payer: Medicare Other

## 2021-07-04 ENCOUNTER — Encounter (HOSPITAL_COMMUNITY): Payer: Self-pay

## 2021-07-04 ENCOUNTER — Other Ambulatory Visit: Payer: Self-pay

## 2021-07-04 DIAGNOSIS — R4182 Altered mental status, unspecified: Secondary | ICD-10-CM | POA: Diagnosis not present

## 2021-07-04 DIAGNOSIS — R410 Disorientation, unspecified: Secondary | ICD-10-CM | POA: Diagnosis not present

## 2021-07-04 DIAGNOSIS — J189 Pneumonia, unspecified organism: Secondary | ICD-10-CM | POA: Diagnosis present

## 2021-07-04 DIAGNOSIS — Z801 Family history of malignant neoplasm of trachea, bronchus and lung: Secondary | ICD-10-CM

## 2021-07-04 DIAGNOSIS — Z20822 Contact with and (suspected) exposure to covid-19: Secondary | ICD-10-CM | POA: Diagnosis present

## 2021-07-04 DIAGNOSIS — Z833 Family history of diabetes mellitus: Secondary | ICD-10-CM | POA: Diagnosis not present

## 2021-07-04 DIAGNOSIS — Z823 Family history of stroke: Secondary | ICD-10-CM

## 2021-07-04 DIAGNOSIS — I959 Hypotension, unspecified: Secondary | ICD-10-CM | POA: Diagnosis not present

## 2021-07-04 DIAGNOSIS — Z818 Family history of other mental and behavioral disorders: Secondary | ICD-10-CM

## 2021-07-04 DIAGNOSIS — F419 Anxiety disorder, unspecified: Secondary | ICD-10-CM | POA: Diagnosis present

## 2021-07-04 DIAGNOSIS — Z8249 Family history of ischemic heart disease and other diseases of the circulatory system: Secondary | ICD-10-CM | POA: Diagnosis not present

## 2021-07-04 DIAGNOSIS — R404 Transient alteration of awareness: Secondary | ICD-10-CM | POA: Diagnosis not present

## 2021-07-04 DIAGNOSIS — I700 Atherosclerosis of aorta: Secondary | ICD-10-CM | POA: Diagnosis not present

## 2021-07-04 DIAGNOSIS — Z681 Body mass index (BMI) 19 or less, adult: Secondary | ICD-10-CM

## 2021-07-04 DIAGNOSIS — A419 Sepsis, unspecified organism: Principal | ICD-10-CM | POA: Diagnosis present

## 2021-07-04 DIAGNOSIS — E860 Dehydration: Secondary | ICD-10-CM | POA: Diagnosis not present

## 2021-07-04 DIAGNOSIS — K219 Gastro-esophageal reflux disease without esophagitis: Secondary | ICD-10-CM | POA: Diagnosis present

## 2021-07-04 DIAGNOSIS — F17290 Nicotine dependence, other tobacco product, uncomplicated: Secondary | ICD-10-CM | POA: Diagnosis present

## 2021-07-04 DIAGNOSIS — I2510 Atherosclerotic heart disease of native coronary artery without angina pectoris: Secondary | ICD-10-CM | POA: Diagnosis present

## 2021-07-04 DIAGNOSIS — M8000XA Age-related osteoporosis with current pathological fracture, unspecified site, initial encounter for fracture: Secondary | ICD-10-CM | POA: Diagnosis present

## 2021-07-04 DIAGNOSIS — R000 Tachycardia, unspecified: Secondary | ICD-10-CM | POA: Diagnosis not present

## 2021-07-04 DIAGNOSIS — K5730 Diverticulosis of large intestine without perforation or abscess without bleeding: Secondary | ICD-10-CM | POA: Diagnosis not present

## 2021-07-04 DIAGNOSIS — L89151 Pressure ulcer of sacral region, stage 1: Secondary | ICD-10-CM | POA: Diagnosis present

## 2021-07-04 DIAGNOSIS — G4700 Insomnia, unspecified: Secondary | ICD-10-CM | POA: Diagnosis present

## 2021-07-04 DIAGNOSIS — R0602 Shortness of breath: Secondary | ICD-10-CM | POA: Diagnosis not present

## 2021-07-04 DIAGNOSIS — K6389 Other specified diseases of intestine: Secondary | ICD-10-CM | POA: Diagnosis not present

## 2021-07-04 DIAGNOSIS — I672 Cerebral atherosclerosis: Secondary | ICD-10-CM | POA: Diagnosis not present

## 2021-07-04 DIAGNOSIS — M8008XA Age-related osteoporosis with current pathological fracture, vertebra(e), initial encounter for fracture: Secondary | ICD-10-CM | POA: Diagnosis present

## 2021-07-04 DIAGNOSIS — L8990 Pressure ulcer of unspecified site, unspecified stage: Secondary | ICD-10-CM | POA: Insufficient documentation

## 2021-07-04 DIAGNOSIS — I6523 Occlusion and stenosis of bilateral carotid arteries: Secondary | ICD-10-CM | POA: Diagnosis not present

## 2021-07-04 DIAGNOSIS — R062 Wheezing: Secondary | ICD-10-CM | POA: Diagnosis not present

## 2021-07-04 DIAGNOSIS — F0391 Unspecified dementia with behavioral disturbance: Secondary | ICD-10-CM | POA: Diagnosis present

## 2021-07-04 DIAGNOSIS — R0689 Other abnormalities of breathing: Secondary | ICD-10-CM | POA: Diagnosis not present

## 2021-07-04 DIAGNOSIS — F0390 Unspecified dementia without behavioral disturbance: Secondary | ICD-10-CM | POA: Diagnosis present

## 2021-07-04 DIAGNOSIS — Z7401 Bed confinement status: Secondary | ICD-10-CM | POA: Diagnosis not present

## 2021-07-04 DIAGNOSIS — E039 Hypothyroidism, unspecified: Secondary | ICD-10-CM | POA: Diagnosis present

## 2021-07-04 DIAGNOSIS — Z66 Do not resuscitate: Secondary | ICD-10-CM | POA: Diagnosis present

## 2021-07-04 DIAGNOSIS — E43 Unspecified severe protein-calorie malnutrition: Secondary | ICD-10-CM | POA: Insufficient documentation

## 2021-07-04 DIAGNOSIS — M8000XS Age-related osteoporosis with current pathological fracture, unspecified site, sequela: Secondary | ICD-10-CM | POA: Diagnosis not present

## 2021-07-04 DIAGNOSIS — R279 Unspecified lack of coordination: Secondary | ICD-10-CM | POA: Diagnosis not present

## 2021-07-04 LAB — LACTIC ACID, PLASMA
Lactic Acid, Venous: 3.2 mmol/L (ref 0.5–1.9)
Lactic Acid, Venous: 1.3 mmol/L (ref 0.5–1.9)

## 2021-07-04 LAB — COMPREHENSIVE METABOLIC PANEL
ALT: 13 U/L (ref 0–44)
AST: 25 U/L (ref 15–41)
Albumin: 2.8 g/dL — ABNORMAL LOW (ref 3.5–5.0)
Alkaline Phosphatase: 67 U/L (ref 38–126)
Anion gap: 10 (ref 5–15)
BUN: 12 mg/dL (ref 8–23)
CO2: 23 mmol/L (ref 22–32)
Calcium: 8.4 mg/dL — ABNORMAL LOW (ref 8.9–10.3)
Chloride: 111 mmol/L (ref 98–111)
Creatinine, Ser: 0.61 mg/dL (ref 0.44–1.00)
GFR, Estimated: >60 mL/min (ref >60)
Glucose, Bld: 126 mg/dL — ABNORMAL HIGH (ref 70–99)
Potassium: 4.3 mmol/L (ref 3.5–5.1)
Sodium: 144 mmol/L (ref 135–145)
Total Bilirubin: 1 mg/dL (ref 0.3–1.2)
Total Protein: 6.1 g/dL — ABNORMAL LOW (ref 6.5–8.1)

## 2021-07-04 LAB — URINALYSIS, ROUTINE W REFLEX MICROSCOPIC
Bilirubin Urine: NEGATIVE
Glucose, UA: NEGATIVE mg/dL
Hgb urine dipstick: NEGATIVE
Ketones, ur: 20 mg/dL — AB
Leukocytes,Ua: NEGATIVE
Nitrite: NEGATIVE
Protein, ur: NEGATIVE mg/dL
Specific Gravity, Urine: 1.018 (ref 1.005–1.030)
pH: 5 (ref 5.0–8.0)

## 2021-07-04 LAB — CBC
HCT: 32.9 % — ABNORMAL LOW (ref 36.0–46.0)
Hemoglobin: 10.2 g/dL — ABNORMAL LOW (ref 12.0–15.0)
MCH: 32.5 pg (ref 26.0–34.0)
MCHC: 31 g/dL (ref 30.0–36.0)
MCV: 104.8 fL — ABNORMAL HIGH (ref 80.0–100.0)
Platelets: 329 10*3/uL (ref 150–400)
RBC: 3.14 MIL/uL — ABNORMAL LOW (ref 3.87–5.11)
RDW: 20.8 % — ABNORMAL HIGH (ref 11.5–15.5)
WBC: 24 10*3/uL — ABNORMAL HIGH (ref 4.0–10.5)
nRBC: 0.1 % (ref 0.0–0.2)

## 2021-07-04 LAB — CBG MONITORING, ED: Glucose-Capillary: 124 mg/dL — ABNORMAL HIGH (ref 70–99)

## 2021-07-04 LAB — RESP PANEL BY RT-PCR (FLU A&B, COVID) ARPGX2
Influenza A by PCR: NEGATIVE
Influenza B by PCR: NEGATIVE
SARS Coronavirus 2 by RT PCR: NEGATIVE

## 2021-07-04 MED ORDER — ENOXAPARIN SODIUM 30 MG/0.3ML IJ SOSY
30.0000 mg | PREFILLED_SYRINGE | INTRAMUSCULAR | Status: DC
  Administered 2021-07-04 – 2021-07-05 (×2): 30 mg via SUBCUTANEOUS
  Filled 2021-07-04 (×2): qty 0.3

## 2021-07-04 MED ORDER — VITAMIN D (ERGOCALCIFEROL) 1.25 MG (50000 UNIT) PO CAPS: 50000.0000 [IU] | ORAL_CAPSULE | ORAL | Status: DC

## 2021-07-04 MED ORDER — SODIUM CHLORIDE 0.9 % IV SOLN
1.0000 g | Freq: Once | INTRAVENOUS | Status: AC
  Administered 2021-07-04: 1 g via INTRAVENOUS
  Filled 2021-07-04: qty 10

## 2021-07-04 MED ORDER — SODIUM CHLORIDE 0.9 % IV SOLN: 250.0000 mL | INTRAVENOUS | Status: DC | PRN

## 2021-07-04 MED ORDER — POLYETHYLENE GLYCOL 3350 17 G PO PACK: 17.0000 g | PACK | Freq: Every day | ORAL | Status: DC | PRN

## 2021-07-04 MED ORDER — SODIUM CHLORIDE 0.9 % IV SOLN
500.0000 mg | INTRAVENOUS | Status: DC
  Administered 2021-07-04 – 2021-07-06 (×3): 500 mg via INTRAVENOUS
  Filled 2021-07-04 (×3): qty 500

## 2021-07-04 MED ORDER — ENSURE ENLIVE PO LIQD: 237.0000 mL | Freq: Two times a day (BID) | ORAL | Status: DC

## 2021-07-04 MED ORDER — CALCIUM CARBONATE-VITAMIN D 500-200 MG-UNIT PO TABS
ORAL_TABLET | Freq: Every day | ORAL | Status: DC
  Administered 2021-07-05 – 2021-07-06 (×2): 1 via ORAL
  Filled 2021-07-04 (×2): qty 1

## 2021-07-04 MED ORDER — TRAZODONE HCL 50 MG PO TABS
50.0000 mg | ORAL_TABLET | Freq: Every evening | ORAL | Status: DC | PRN
  Administered 2021-07-05: 50 mg via ORAL
  Filled 2021-07-04: qty 1

## 2021-07-04 MED ORDER — ONDANSETRON HCL 4 MG PO TABS: 4.0000 mg | ORAL_TABLET | Freq: Four times a day (QID) | ORAL | Status: DC | PRN

## 2021-07-04 MED ORDER — SODIUM CHLORIDE 0.9 % IV BOLUS: 500.0000 mL | Freq: Once | INTRAVENOUS | Status: DC

## 2021-07-04 MED ORDER — SODIUM CHLORIDE 0.9 % IV SOLN
2.0000 g | INTRAVENOUS | Status: DC
  Administered 2021-07-05 – 2021-07-06 (×2): 2 g via INTRAVENOUS
  Filled 2021-07-04 (×2): qty 20

## 2021-07-04 MED ORDER — ACETAMINOPHEN 325 MG PO TABS: 650.0000 mg | ORAL_TABLET | Freq: Four times a day (QID) | ORAL | Status: DC | PRN

## 2021-07-04 MED ORDER — OXYCODONE HCL 5 MG PO TABS: 5.0000 mg | ORAL_TABLET | ORAL | Status: DC | PRN

## 2021-07-04 MED ORDER — IOHEXOL 350 MG/ML SOLN
80.0000 mL | Freq: Once | INTRAVENOUS | Status: AC | PRN
  Administered 2021-07-04: 80 mL via INTRAVENOUS

## 2021-07-04 MED ORDER — GABAPENTIN 100 MG PO CAPS
200.0000 mg | ORAL_CAPSULE | Freq: Two times a day (BID) | ORAL | Status: DC
  Administered 2021-07-04 – 2021-07-06 (×4): 200 mg via ORAL
  Filled 2021-07-04 (×4): qty 2

## 2021-07-04 MED ORDER — SODIUM CHLORIDE 0.9 % IV SOLN: INTRAVENOUS | Status: DC

## 2021-07-04 MED ORDER — POLYVINYL ALCOHOL 1.4 % OP SOLN
1.0000 [drp] | Freq: Three times a day (TID) | OPHTHALMIC | Status: DC | PRN
  Administered 2021-07-05: 1 [drp] via OPHTHALMIC
  Filled 2021-07-04: qty 15

## 2021-07-04 MED ORDER — SODIUM CHLORIDE 0.9 % IV BOLUS
1000.0000 mL | Freq: Once | INTRAVENOUS | Status: AC
  Administered 2021-07-04: 1000 mL via INTRAVENOUS

## 2021-07-04 MED ORDER — SODIUM CHLORIDE 0.9% FLUSH: 3.0000 mL | INTRAVENOUS | Status: DC | PRN

## 2021-07-04 MED ORDER — SODIUM CHLORIDE 0.9% FLUSH: 3.0000 mL | Freq: Two times a day (BID) | INTRAVENOUS | Status: DC

## 2021-07-04 MED ORDER — SODIUM CHLORIDE 0.9% FLUSH
3.0000 mL | Freq: Two times a day (BID) | INTRAVENOUS | Status: DC
  Administered 2021-07-04: 3 mL via INTRAVENOUS

## 2021-07-04 MED ORDER — ACETAMINOPHEN 650 MG RE SUPP: 650.0000 mg | Freq: Four times a day (QID) | RECTAL | Status: DC | PRN

## 2021-07-04 MED ORDER — CLONAZEPAM 0.5 MG PO TABS
0.5000 mg | ORAL_TABLET | Freq: Every day | ORAL | Status: DC | PRN
  Administered 2021-07-05: 0.5 mg via ORAL
  Filled 2021-07-04: qty 1

## 2021-07-04 MED ORDER — ALBUTEROL SULFATE (2.5 MG/3ML) 0.083% IN NEBU: 2.5000 mg | INHALATION_SOLUTION | RESPIRATORY_TRACT | Status: DC | PRN

## 2021-07-04 MED ORDER — GUAIFENESIN-DM 100-10 MG/5ML PO SYRP: 10.0000 mL | ORAL_SOLUTION | ORAL | Status: DC | PRN

## 2021-07-04 MED ORDER — PANTOPRAZOLE SODIUM 40 MG PO TBEC
40.0000 mg | DELAYED_RELEASE_TABLET | Freq: Every day | ORAL | Status: DC
  Administered 2021-07-05 – 2021-07-06 (×2): 40 mg via ORAL
  Filled 2021-07-04 (×3): qty 1

## 2021-07-04 MED ORDER — ONDANSETRON HCL 4 MG/2ML IJ SOLN: 4.0000 mg | Freq: Four times a day (QID) | INTRAMUSCULAR | Status: DC | PRN

## 2021-07-04 MED ORDER — BISACODYL 10 MG RE SUPP: 10.0000 mg | Freq: Every day | RECTAL | Status: DC | PRN

## 2021-07-04 MED ORDER — LEVOTHYROXINE SODIUM 25 MCG PO TABS
25.0000 ug | ORAL_TABLET | Freq: Every day | ORAL | Status: DC
  Administered 2021-07-05 – 2021-07-06 (×2): 25 ug via ORAL
  Filled 2021-07-04 (×3): qty 1

## 2021-07-04 MED ORDER — MAGNESIUM 200 MG PO TABS
250.0000 mg | ORAL_TABLET | Freq: Every day | ORAL | Status: DC
  Filled 2021-07-04 (×2): qty 2
  Filled 2021-07-04: qty 1
  Filled 2021-07-04 (×2): qty 2

## 2021-07-04 NOTE — ED Notes (Addendum)
Pt to CT

## 2021-07-04 NOTE — ED Notes (Signed)
Cultures collected by lab 

## 2021-07-04 NOTE — Sepsis Progress Note (Signed)
Elink following for Sepsis Protocol 

## 2021-07-04 NOTE — ED Notes (Signed)
Critical lactic of 3.2 reported to edp bero at this time
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
## 2021-07-04 NOTE — ED Notes (Signed)
Daughter bought to room

## 2021-07-04 NOTE — ED Triage Notes (Signed)
Pt brought by [facility] from home. Pt lives with daughter. When they got there pt had decreased responsiveness. 91% on room air and wheezing, rapid respirations. Placed on 4l [place] and breathing slowed. 100% on room air here. Pt does have dementia at baseline. Speaking but it is incomprehensible. Ems also states that daughter said that pt points to the sky and says "shes going"   No DNR papers.

## 2021-07-04 NOTE — H&P (Signed)
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 ischemia without acute intracranial abnormality. Electronically Signed   By: Ulyses Jarred M.D.   On: 07/04/2021 03:58   CT CHEST W CONTRAST  Result Date: 07/04/2021 CLINICAL DATA:  Sepsis.  Evaluate for abscess. EXAM: CT CHEST, ABDOMEN, AND PELVIS WITH CONTRAST TECHNIQUE: Multidetector CT imaging of the chest, abdomen and pelvis was performed following the standard protocol during bolus administration of intravenous contrast. CONTRAST:  75m OMNIPAQUE IOHEXOL 350 MG/ML SOLN COMPARISON:  None. FINDINGS: CT CHEST FINDINGS Cardiovascular: The heart size is normal. No substantial pericardial effusion. Coronary artery  calcification is evident. Insert calcium thoracic Mediastinum/Nodes: No mediastinal lymphadenopathy. There is no hilar lymphadenopathy. Moderate hiatal hernia. The esophagus has normal imaging features. There is no axillary lymphadenopathy. Lungs/Pleura: Biapical pleuroparenchymal scarring evident. Patchy peribronchovascular ground-glass nodularity identified posterior lower lobes bilaterally (66/7 on the right and 59/7 on the left. Focal area of ground-glass opacity identified posterior left upper lobe along the fissure. No dense focal airspace consolidation. No pneumothorax or substantial pleural effusion. Musculoskeletal: No worrisome lytic or sclerotic osseous abnormality. CT ABDOMEN PELVIS FINDINGS Hepatobiliary: No suspicious focal abnormality within the liver parenchyma. There is no evidence for gallstones, gallbladder wall thickening, or pericholecystic fluid. No intrahepatic or extrahepatic biliary dilation. Pancreas: No focal mass lesion. No dilatation of the main duct. No intraparenchymal cyst. No peripancreatic edema. Spleen: No splenomegaly. No focal mass lesion. Adrenals/Urinary Tract: No adrenal nodule or mass. Central sinus cysts noted in the kidneys bilaterally. No evidence for hydroureter. Bladder is partially obscured by beam hardening artifact from right hip replacement. Stomach/Bowel: Moderate hiatal hernia. Stomach decompressed. Duodenum is normally positioned as is the ligament of Treitz. No small bowel wall thickening. No small bowel dilatation. The terminal ileum is normal. The appendix  is not well visualized, but there is no edema or inflammation in the region of the cecum. Colon is diffusely decompressed, likely accentuating wall thickening noted in the right and transverse colon although. Mild diverticular disease noted sigmoid colon without diverticulitis. Vascular/Lymphatic: There is advanced atherosclerotic calcification of the abdominal aorta without aneurysm. There is no  gastrohepatic or hepatoduodenal ligament lymphadenopathy. No retroperitoneal or mesenteric lymphadenopathy. No pelvic sidewall lymphadenopathy. Reproductive: The uterus is unremarkable.  There is no adnexal mass. Other: No intraperitoneal free fluid. Musculoskeletal: Status post right hip replacement. Bones are diffusely demineralized. No worrisome lytic or sclerotic osseous abnormality. Compression deformity noted at T8 with inferior endplate compression noted at T12. Variable loss of vertebral body height at all levels from L1-L5, consistent with compression fractures. IMPRESSION: 1. Patchy peribronchovascular ground-glass nodularity posterior lower lobes bilaterally with more focal area of ground-glass opacity posterior left upper lobe. Imaging features likely related to infectious/inflammatory etiology. Atypical infection (including viral) should be considered. 2. No evidence for abscess in the abdomen/pelvis. 3. Moderate hiatal hernia. 4. Mild sigmoid diverticulosis without diverticulitis. The appearance of wall thickening in the right and transverse colon likely related to the decompressed state. Right-sided colitis is not considered likely but can not be completely excluded. 5. Osteopenia with multilevel compression fractures in the thoracic and lumbar spine. 6. Aortic Atherosclerosis (ICD10-I70.0). Electronically Signed   By: Misty Stanley M.D.   On: 07/04/2021 07:50   CT ABDOMEN PELVIS W CONTRAST  Result Date: 07/04/2021 CLINICAL DATA:  Sepsis.  Evaluate for abscess. EXAM: CT CHEST, ABDOMEN, AND PELVIS WITH CONTRAST TECHNIQUE: Multidetector CT imaging of the chest, abdomen and pelvis was performed following the standard protocol during bolus administration of intravenous contrast. CONTRAST:  60m OMNIPAQUE IOHEXOL 350 MG/ML SOLN COMPARISON:  None. FINDINGS: CT CHEST FINDINGS Cardiovascular: The heart size is normal. No substantial pericardial effusion. Coronary artery calcification is evident. Insert  calcium thoracic Mediastinum/Nodes: No mediastinal lymphadenopathy. There is no hilar lymphadenopathy. Moderate hiatal hernia. The esophagus has normal imaging features. There is no axillary lymphadenopathy. Lungs/Pleura: Biapical pleuroparenchymal scarring evident. Patchy peribronchovascular ground-glass nodularity identified posterior lower lobes bilaterally (66/7 on the right and 59/7 on the left. Focal area of ground-glass opacity identified posterior left upper lobe along the fissure. No dense focal airspace consolidation. No pneumothorax or substantial pleural effusion. Musculoskeletal: No worrisome lytic or sclerotic osseous abnormality. CT ABDOMEN PELVIS FINDINGS Hepatobiliary: No suspicious focal abnormality within the liver parenchyma. There is no evidence for gallstones, gallbladder wall thickening, or pericholecystic fluid. No intrahepatic or extrahepatic biliary dilation. Pancreas: No focal mass lesion. No dilatation of the main duct. No intraparenchymal cyst. No peripancreatic edema. Spleen: No splenomegaly. No focal mass lesion. Adrenals/Urinary Tract: No adrenal nodule or mass. Central sinus cysts noted in the kidneys bilaterally. No evidence for hydroureter. Bladder is partially obscured by beam hardening artifact from right hip replacement. Stomach/Bowel: Moderate hiatal hernia. Stomach decompressed. Duodenum is normally positioned as is the ligament of Treitz. No small bowel wall thickening. No small bowel dilatation. The terminal ileum is normal. The appendix is not well visualized, but there is no edema or inflammation in the region of the cecum. Colon is diffusely decompressed, likely accentuating wall thickening noted in the right and transverse colon although. Mild diverticular disease noted sigmoid colon without diverticulitis. Vascular/Lymphatic: There is advanced atherosclerotic calcification of the abdominal aorta without aneurysm. There is no gastrohepatic or hepatoduodenal ligament  lymphadenopathy. No retroperitoneal or mesenteric lymphadenopathy. No pelvic sidewall lymphadenopathy. Reproductive: The uterus is unremarkable.  There  is no adnexal mass. Other: No intraperitoneal free fluid. Musculoskeletal: Status post right hip replacement. Bones are diffusely demineralized. No worrisome lytic or sclerotic osseous abnormality. Compression deformity noted at T8 with inferior endplate compression noted at T12. Variable loss of vertebral body height at all levels from L1-L5, consistent with compression fractures. IMPRESSION: 1. Patchy peribronchovascular ground-glass nodularity posterior lower lobes bilaterally with more focal area of ground-glass opacity posterior left upper lobe. Imaging features likely related to infectious/inflammatory etiology. Atypical infection (including viral) should be considered. 2. No evidence for abscess in the abdomen/pelvis. 3. Moderate hiatal hernia. 4. Mild sigmoid diverticulosis without diverticulitis. The appearance of wall thickening in the right and transverse colon likely related to the decompressed state. Right-sided colitis is not considered likely but can not be completely excluded. 5. Osteopenia with multilevel compression fractures in the thoracic and lumbar spine. 6. Aortic Atherosclerosis (ICD10-I70.0). Electronically Signed   By: Misty Stanley M.D.   On: 07/04/2021 07:50   DG Chest Portable 1 View  Result Date: 07/04/2021 CLINICAL DATA:  Shortness of breath EXAM: PORTABLE CHEST 1 VIEW COMPARISON:  09/25/2020 FINDINGS: Lungs are clear.  No pleural effusion or pneumothorax. The heart is normal in size.  Thoracic aortic atherosclerosis. IMPRESSION: No evidence of acute cardiopulmonary disease. Electronically Signed   By: Julian Hy M.D.   On: 07/04/2021 03:38    DVT Prophylaxis -SCD /lovenox AM Labs Ordered, also please review Full Orders  Family Communication: Admission, patients condition and plan of care including tests being ordered  have been discussed with the patient and daughter at bedside who indicate understanding and agree with the plan   Code Status - Full Code  Likely DC to home with daughter after resolution of sepsis pathophysiology  Condition   stable  Roxan Hockey M.D on 07/04/2021 at 5:48 PM Go to [email or web address] -  for contact info  [facility] - Office  [phone]

## 2021-07-04 NOTE — ED Notes (Signed)
Lab in room.

## 2021-07-04 NOTE — ED Notes (Signed)
XR at bedside

## 2021-07-04 NOTE — ED Notes (Addendum)
Screener called to call daughter

## 2021-07-05 DIAGNOSIS — L89151 Pressure ulcer of sacral region, stage 1: Secondary | ICD-10-CM

## 2021-07-05 DIAGNOSIS — J189 Pneumonia, unspecified organism: Secondary | ICD-10-CM

## 2021-07-05 DIAGNOSIS — M8000XS Age-related osteoporosis with current pathological fracture, unspecified site, sequela: Secondary | ICD-10-CM

## 2021-07-05 DIAGNOSIS — F419 Anxiety disorder, unspecified: Secondary | ICD-10-CM | POA: Diagnosis not present

## 2021-07-05 DIAGNOSIS — A419 Sepsis, unspecified organism: Secondary | ICD-10-CM | POA: Diagnosis not present

## 2021-07-05 DIAGNOSIS — E039 Hypothyroidism, unspecified: Secondary | ICD-10-CM

## 2021-07-05 DIAGNOSIS — F0390 Unspecified dementia without behavioral disturbance: Secondary | ICD-10-CM

## 2021-07-05 DIAGNOSIS — E43 Unspecified severe protein-calorie malnutrition: Secondary | ICD-10-CM | POA: Insufficient documentation

## 2021-07-05 DIAGNOSIS — K219 Gastro-esophageal reflux disease without esophagitis: Secondary | ICD-10-CM

## 2021-07-05 DIAGNOSIS — L8990 Pressure ulcer of unspecified site, unspecified stage: Secondary | ICD-10-CM | POA: Insufficient documentation

## 2021-07-05 LAB — CBC
HCT: 25.5 % — ABNORMAL LOW (ref 36.0–46.0)
Hemoglobin: 7.9 g/dL — ABNORMAL LOW (ref 12.0–15.0)
MCH: 31.9 pg (ref 26.0–34.0)
MCHC: 31 g/dL (ref 30.0–36.0)
MCV: 102.8 fL — ABNORMAL HIGH (ref 80.0–100.0)
Platelets: 235 10*3/uL (ref 150–400)
RBC: 2.48 MIL/uL — ABNORMAL LOW (ref 3.87–5.11)
RDW: 20.3 % — ABNORMAL HIGH (ref 11.5–15.5)
WBC: 12 10*3/uL — ABNORMAL HIGH (ref 4.0–10.5)
nRBC: 0.2 % (ref 0.0–0.2)

## 2021-07-05 LAB — BASIC METABOLIC PANEL
Anion gap: 7 (ref 5–15)
BUN: 8 mg/dL (ref 8–23)
CO2: 21 mmol/L — ABNORMAL LOW (ref 22–32)
Calcium: 8.3 mg/dL — ABNORMAL LOW (ref 8.9–10.3)
Chloride: 114 mmol/L — ABNORMAL HIGH (ref 98–111)
Creatinine, Ser: 0.33 mg/dL — ABNORMAL LOW (ref 0.44–1.00)
GFR, Estimated: >60 mL/min (ref >60)
Glucose, Bld: 86 mg/dL (ref 70–99)
Potassium: 3.5 mmol/L (ref 3.5–5.1)
Sodium: 142 mmol/L (ref 135–145)

## 2021-07-05 MED ORDER — ENSURE ENLIVE PO LIQD
237.0000 mL | Freq: Three times a day (TID) | ORAL | Status: DC
  Administered 2021-07-05 – 2021-07-06 (×4): 237 mL via ORAL

## 2021-07-05 MED ORDER — MAGNESIUM OXIDE -MG SUPPLEMENT 400 (240 MG) MG PO TABS
200.0000 mg | ORAL_TABLET | Freq: Every day | ORAL | Status: DC
  Administered 2021-07-05 – 2021-07-06 (×2): 200 mg via ORAL
  Filled 2021-07-05 (×2): qty 1

## 2021-07-05 MED ORDER — ADULT MULTIVITAMIN W/MINERALS CH
1.0000 | ORAL_TABLET | Freq: Every day | ORAL | Status: DC
  Administered 2021-07-05 – 2021-07-06 (×2): 1 via ORAL
  Filled 2021-07-05 (×2): qty 1

## 2021-07-05 NOTE — Care Management Important Message (Signed)
Important Message  Patient Details  Name: Kara [name] MRN: [id number] Date of Birth: 1927-01-15   Medicare Important Message Given:  Yes     [name] 07/05/2021, 1:36 PM

## 2021-07-05 NOTE — Progress Notes (Signed)
Patient's daughter fed patient lunch and supper and half cup pudding, four bites soup, 3 bites oatmeal, 3 bites applesauce, four bites chicken puree, four bites mac n cheese 3 bites carrots, 8 sips tea and half ensure.

## 2021-07-05 NOTE — Progress Notes (Signed)
Initial Nutrition Assessment  DOCUMENTATION CODES:  Severe malnutrition in context of chronic illness, Underweight  INTERVENTION:  Downgrade diet to Dysphagia 2 with thin liquids.  Increase Ensure from BID to TID.  Add MVI with minerals daily.  Recommend feeding assistance with meals and supplements.  NUTRITION DIAGNOSIS:  Severe Malnutrition related to chronic illness (dementia) as evidenced by severe muscle depletion, severe fat depletion, percent weight loss.  GOAL:  Patient will meet greater than or equal to 90% of their needs  MONITOR:  PO intake, Supplement acceptance, Diet advancement, Labs, Weight trends, I & O's, Skin  REASON FOR ASSESSMENT:  Malnutrition Screening Tool    ASSESSMENT:  87 yo female with a PMH of advanced dementia with significant cognitive and memory deficits, requiring total care with primary caregiver who is her daughter lives with her at home, as well as hypothyroidism, osteopenia with current compression fractures, GERD, CAD, and anxiety disorder who is brought in to ED due to concerns of increasing confusion and decreased responsiveness cough and wheezing as well as tachypnea. Admitted with sepsis 2/2 CAP.  Per Epic, pt ate 0% of her breakfast this morning. Spoke with nurse tech, and she reported that pt did not want anything, only drinking 1/2 of an orange juice, pushed away the spoon when trying to be fed.  Attempted to contact daughter to obtain food preferences and possible diet texture at home, but unable to reach daughter by phone.  RD to downgrade diet to Dysphagia 2 given that pt's dentures are at home and she does not have any real dentition left.  Pt has lost 18 lbs (16.5%) in the past 9 months, which is significant and severe for the time frame.  Pt with severe depletions in almost every examined area on exam.  Recommend increasing Ensure to TID, downgrading diet to Dysphagia 2, and adding MVI with minerals daily.  Supplements:  Ensure Enlive BID  Medications: reviewed; OsCal with Vitamin D, Synthroid, Mag-Ox, Protonix, NaCl @ 100 ml/hr  Labs: reviewed  NUTRITION - FOCUSED PHYSICAL EXAM: Flowsheet Row Most Recent Value  Orbital Region Severe depletion  Upper Arm Region Severe depletion  Thoracic and Lumbar Region Moderate depletion  Buccal Region Severe depletion  Temple Region Severe depletion  Clavicle Bone Region Severe depletion  Clavicle and Acromion Bone Region Severe depletion  Scapular Bone Region Severe depletion  Dorsal Hand Unable to assess  [[place]]  Patellar Region Severe depletion  Anterior Thigh Region Severe depletion  Posterior Calf Region Severe depletion  Edema (RD Assessment) None  Hair Reviewed  Eyes Unable to assess  Mouth Unable to assess  Skin Reviewed  Nails Unable to assess  [Mitts]   Diet Order:   Diet Order             Diet Heart Room service appropriate? Yes; Fluid consistency: Thin  Diet effective now                  EDUCATION NEEDS:  Not appropriate for education at this time  Skin:  Skin Assessment: Skin Integrity Issues: Skin Integrity Issues:: Stage I Stage I: Pressure Injury - sacrum  Last BM:  07/04/21 - Type 7, large  Height:  Ht Readings from Last 1 Encounters:  07/04/21 '5\' 3"'$  (1.6 m)   Weight:  Wt Readings from Last 1 Encounters:  07/04/21 43.6 kg   BMI:  Body mass index is 17.03 kg/m.  Estimated Nutritional Needs:  Kcal:  1650-1850 Protein:  60-75 grams Fluid:  >1.65 L  Derrel Nip, RD, LDN (she/her/hers) Registered Dietitian I After-Hours/Weekend Pager # in [place]

## 2021-07-05 NOTE — Progress Notes (Signed)
PT Cancellation Note  Patient Details Name: Kara [name] MRN: [id number] DOB: [birth year]   Cancelled Treatment:    Reason Eval/Treat Not Completed: Other (comment) (pt agitated, request hold PT eval). RN request we hold PT eval for later today, will check back as pt appropriate.    Tori Trayden Brandy PT, DPT 07/05/21, 10:45 AM

## 2021-07-05 NOTE — Evaluation (Signed)
Physical Therapy Evaluation Only Patient Details Name: Kara [name] MRN: [id number] DOB: 1926/11/20 Today's Date: 07/05/2021  History of Present Illness  Kara [name] is a 87 y.o. female presents with AMS. CT head without acute intracranial finding. CT chest Osteopenia with multilevel compression fractures in the thoracic and lumbar spine. PMH: glaucoma, osteoporosis, R hip hemiarthroplasty 2019   Clinical Impression  Pt is at baseline per conversation with daughter at bedside, therapist assisted pt to sitting EOB and supported pt in sitting while daughter combed hair and therapist assisted pt to wash face for ~10 minutes. Assisted pt back to supine, daughter at bedside encouraging pt to eat and RN entering room. Recommend hospital bed due to pt bedbound and requiring total A for care. No acute PT needs identified, will sign off at this time.     Recommendations for follow up therapy are one component of a multi-disciplinary discharge planning process, led by the attending physician.  Recommendations may be updated based on patient status, additional functional criteria and insurance authorization.  Follow Up Recommendations No PT follow up;Supervision/Assistance - 24 hour    Equipment Recommendations  Hospital bed    Recommendations for Other Services       Precautions / Restrictions Precautions Precautions: Fall Restrictions Weight Bearing Restrictions: No      Mobility  Bed Mobility Overal bed mobility: Needs Assistance Bed Mobility: Supine to Sit;Sit to Supine  Supine to sit: Total assist;HOB elevated Sit to supine: Total assist   General bed mobility comments: total A for supine<>sit for therapist to assist BLE off of bed and upright trunk, total A to scoot out to EOB with trunk support, tolerates sitting EOB supported for ~10 minutes while daughter combed hair and therapist assist with washing face    Transfers     Ambulation/Gait     Stairs             Wheelchair Mobility    Modified Rankin (Stroke Patients Only)       Balance Overall balance assessment: Needs assistance Sitting-balance support: Feet supported;Bilateral upper extremity supported Sitting balance-Leahy Scale: Zero Sitting balance - Comments: max-min A for supported sitting EOB ~10 minutes        Pertinent Vitals/Pain Pain Assessment: Faces Faces Pain Scale: No hurt    Home Living Family/patient expects to be discharged to:: Private residence Living Arrangements: Children Available Help at Discharge: Family;Available 24 hours/day;Personal care attendant (aide Mon-Thurs + Sat for 3-4 hrs/day) Type of Home: Mobile home Home Access: Ramped entrance     Home Layout: One level Home Equipment: [place] - 2 wheels;Bedside commode;Shower seat;Wheelchair - manual Additional Comments: waiting for hospital bed x2 months    Prior Function Level of Independence: Needs assistance   Gait / Transfers Assistance Needed: daughter reports pt hasn't ambulated in >1 year, hasn't transferred in >6 months  ADL's / Homemaking Assistance Needed: total assist for sponge baths, wearing diapers for toileting, total assist with feeding        Hand Dominance        Extremity/Trunk Assessment   Upper Extremity Assessment Upper Extremity Assessment: Generalized weakness    Lower Extremity Assessment Lower Extremity Assessment: Generalized weakness    Cervical / Trunk Assessment Cervical / Trunk Assessment: Kyphotic  Communication   Communication: No difficulties  Cognition Arousal/Alertness: Lethargic Behavior During Therapy: Flat affect Overall Cognitive Status: History of cognitive impairments - at baseline    General Comments: pt's daughter at bedside providing history, pt with dementia, able to state  yes/no and minimal conversation at times      General Comments      Exercises     Assessment/Plan    PT Assessment Patent does not need any further PT  services  PT Problem List         PT Treatment Interventions      PT Goals (Current goals can be found in the Care Plan section)  Acute Rehab PT Goals Patient Stated Goal: return home with daughter and aide assisting PT Goal Formulation: All assessment and education complete, DC therapy    Frequency     Barriers to discharge        Co-evaluation               AM-PAC PT "6 Clicks" Mobility  Outcome Measure Help needed turning from your back to your side while in a flat bed without using bedrails?: Total Help needed moving from lying on your back to sitting on the side of a flat bed without using bedrails?: Total Help needed moving to and from a bed to a chair (including a wheelchair)?: Total Help needed standing up from a chair using your arms (e.g., wheelchair or bedside chair)?: Total Help needed to walk in hospital room?: Total Help needed climbing 3-5 steps with a railing? : Total 6 Click Score: 6    End of Session   Activity Tolerance: Patient tolerated treatment well Patient left: in bed;with call bell/phone within reach;with bed alarm set;with nursing/sitter in room;with family/visitor present Nurse Communication: Mobility status PT Visit Diagnosis: Other abnormalities of gait and mobility (R26.89)    Time: 1536-1600 PT Time Calculation (min) (ACUTE ONLY): 24 min   Charges:   PT Evaluation $PT Eval Low Complexity: 1 Low PT Treatments $Therapeutic Activity: 8-22 mins         Tori Kenndra Morris PT, DPT 07/05/21, 4:09 PM

## 2021-07-05 NOTE — Progress Notes (Addendum)
PROGRESS NOTE    Kara [name]  WSF:[id number] DOB: [birth year] DOA: 07/04/2021 PCP: Fayrene Helper, MD   Chief Complaint  Patient presents with   Altered Mental Status    Brief admission narrative:  As per H&P written by Dr. Denton Brick on 0/17/4944 Kara [name]  is a 87 y.o. female---Almost 87 year old with advanced dementia with significant cognitive and memory deficits, requiring total care with primary caregiver who is her daughter lives with her at home--, as well as history of hypothyroidism, osteopenia with current compression fractures, GERD and history of CAD and anxiety disorder who is brought in to ED due to concerns of increasing confusion and decreased responsiveness cough and wheezing as well as tachypnea -As per daughter's been difficult to feed patient due to increasing confusional episodes and uncooperative behavior -In the ED she is found to be wheezy and tachypneic O2 sats around 90% on room air -Daughter reports possible fevers at home -History obtained from daughter as patient is unable to provide any history -No concerns about chest pains No Nausea, Vomiting or Diarrhea In ED--CT chest with contrast and CT abdomen and pelvis with contrast shows groundglass nodular opacity suggestive of infection, cannot exclude inflammatory process -CT head without acute intracranial finding -UA is not suspicious for UTI -CBC with a white count of 24,000, hemoglobin 10.2 platelets 329 -Creatinine 0.61 with a glucose of 126 Initial Lactic acid 3.2, repeat after sepsis protocol IV fluids is 1.3 -EKG sinus tachycardia without acute findings  Assessment & Plan: 1-Sepsis secondary to CAP -Patient met sepsis criteria on admissionWith the presence of tachycardia, tachypnea, elevated WBCs and a lactic acid of 3.2. -Chest x-ray/CT demonstrating PNA -Given underlying history of dementia patient at risk for aspiration pneumonia -Per daughter still having difficulty tolerating oral  intake -Continue IV antibiotic -Continue supportive care and as needed bronchodilators.  2-Age-related osteoporosis with current pathological fracture -Continue adjusted dose of vitamin D and calcium -Continue as needed analgesics.  3-GERD (gastroesophageal reflux disease) -Continue PPI  4-Anxiety -Continue as needed clonazepam.  5-hypothyroidism -Continue Synthroid.  6-insomnia -Continue as needed trazodone  7-severe protein calorie malnutrition -In the setting of poor oral intake due to underlying dementia -Continue to follow nutritional service recommendations for feeding supplements -Maintain adequate hydration.  8-stage I pressure injury: Sacral area present on admission -Continue constant repositioning -Continue preventive measures and barriers.    9-history of dementia with behavioral disturbance -A stable mood currently and continue supportive care and as needed anxiolytic medications per home regimen.   DVT prophylaxis: Lovenox Code Status: DNR Family Communication: Daughter at bedside Disposition:   Status is: Inpatient  Remains inpatient appropriate because:IV treatments appropriate due to intensity of illness or inability to take PO  Dispo: The patient is from: Home              Anticipated d/c is to: Home              Patient currently is not medically stable to d/c.   Difficult to place patient No     Consultants:  None   Procedures:    Antimicrobials:  Rocephin and Zithromax   Subjective: Afebrile, no chest pain, no nausea, no edema.  Family reports decreased oral intake and expressed patient being very sleepy today.  Objective: Vitals:   07/04/21 2030 07/04/21 2045 07/05/21 0548 07/05/21 1409  BP: 133/60 132/66 (!) 100/49 (!) 106/53  Pulse: 92 (!) 101 70 77  Resp: (!) _0 Temp:  98.3 F (  36.8 C) (!) 97.5 F (36.4 C) 98.6 F (37 C)  TempSrc:  Oral Oral Oral  SpO2: 98% 98% 92% 100%  Weight:  43.6 kg    Height:  5' 3"  (1.6 m)      Intake/Output Summary (Last 24 hours) at 07/05/2021 2113 Last data filed at 07/05/2021 0900 Gross per 24 hour  Intake 2525 ml  Output --  Net 2525 ml   Filed Weights   07/04/21 0259 07/04/21 2045  Weight: 41.5 kg 43.6 kg    Examination:  General exam: Appears calm and comfortable, chronically ill and underweight in appearance; reports no chest pain, per daughter at bedside patient very sleepy and no eating much.  No fever.   Respiratory system: No using accessory muscles, good saturation on room air.  Positive scattered rhonchi are appreciated bilaterally. Cardiovascular system: No JVD, no murmurs, no rubs, no gallops. Gastrointestinal system: Abdomen is nondistended, soft and nontender. No organomegaly or masses felt. Normal bowel sounds heard. Central nervous system: No focal neurological deficits. Extremities: No cyanosis or clubbing. Skin: No petechiae.  Stage I pressure injury appreciated in sacral area; no signs of superimposed infection.  Present on admission. Psychiatry: Judgment and insight impaired secondary to underlying dementia.  Mood & affect appropriate.     Data Reviewed: I have personally reviewed following labs and imaging studies  CBC: Recent Labs  Lab 07/04/21 0448 07/05/21 0552  WBC 24.0* 12.0*  HGB 10.2* 7.9*  HCT 32.9* 25.5*  MCV 104.8* 102.8*  PLT 329 169    Basic Metabolic Panel: Recent Labs  Lab 07/04/21 0448 07/05/21 0552  NA 144 142  K 4.3 3.5  CL 111 114*  CO2 23 21*  GLUCOSE 126* 86  BUN 12 8  CREATININE 0.61 0.33*  CALCIUM 8.4* 8.3*    GFR: Estimated Creatinine Clearance: 31.5 mL/min (A) (by C-G formula based on SCr of 0.33 mg/dL (L)).  Liver Function Tests: Recent Labs  Lab 07/04/21 0448  AST 25  ALT 13  ALKPHOS 67  BILITOT 1.0  PROT 6.1*  ALBUMIN 2.8*   CBG: Recent Labs  Lab 07/04/21 0342  GLUCAP 124*    Recent Results (from the past 240 hour(s))  Resp Panel by RT-PCR (Flu A&B, Covid)  Nasopharyngeal Swab     Status: None   Collection Time: 07/04/21  3:20 AM   Specimen: Nasopharyngeal Swab; Nasopharyngeal(NP) swabs in vial transport medium  Result Value Ref Range Status   SARS Coronavirus 2 by RT PCR NEGATIVE NEGATIVE Final    Comment: (NOTE) SARS-CoV-2 target nucleic acids are NOT DETECTED.  The SARS-CoV-2 RNA is generally detectable in upper respiratory specimens during the acute phase of infection. The lowest concentration of SARS-CoV-2 viral copies this assay can detect is 138 copies/mL. A negative result does not preclude SARS-Cov-2 infection and should not be used as the sole basis for treatment or other patient management decisions. A negative result may occur with  improper specimen collection/handling, submission of specimen other than nasopharyngeal swab, presence of viral mutation(s) within the areas targeted by this assay, and inadequate number of viral copies(<138 copies/mL). A negative result must be combined with clinical observations, patient history, and epidemiological information. The expected result is Negative.  Fact Sheet for Patients:  EntrepreneurPulse.com.au  Fact Sheet for Healthcare Providers:  IncredibleEmployment.be  This test is no t yet approved or cleared by the [place] FDA and  has been authorized for detection and/or diagnosis of SARS-CoV-2 by FDA under an Emergency Use Authorization (  EUA). This EUA will remain  in effect (meaning this test can be used) for the duration of the COVID-19 declaration under Section 564(b)(1) of the Act, 21 U.S.C.section 360bbb-3(b)(1), unless the authorization is terminated  or revoked sooner.       Influenza A by PCR NEGATIVE NEGATIVE Final   Influenza B by PCR NEGATIVE NEGATIVE Final    Comment: (NOTE) The Xpert Xpress SARS-CoV-2/FLU/RSV plus assay is intended as an aid in the diagnosis of influenza from Nasopharyngeal swab specimens and should not be  used as a sole basis for treatment. Nasal washings and aspirates are unacceptable for Xpert Xpress SARS-CoV-2/FLU/RSV testing.  Fact Sheet for Patients: EntrepreneurPulse.com.au  Fact Sheet for Healthcare Providers: IncredibleEmployment.be  This test is not yet approved or cleared by the [place] FDA and has been authorized for detection and/or diagnosis of SARS-CoV-2 by FDA under an Emergency Use Authorization (EUA). This EUA will remain in effect (meaning this test can be used) for the duration of the COVID-19 declaration under Section 564(b)(1) of the Act, 21 U.S.C. section 360bbb-3(b)(1), unless the authorization is terminated or revoked.  Performed at [facility], [address], [place], [place] [address]   Blood culture (routine x 2)     Status: None (Preliminary result)   Collection Time: 07/04/21  5:54 AM   Specimen: BLOOD  Result Value Ref Range Status   Specimen Description BLOOD LEFT ANTECUBITAL  Final   Special Requests   Final    BOTTLES DRAWN AEROBIC AND ANAEROBIC Blood Culture adequate volume   Culture   Final    NO GROWTH 1 DAY Performed at [facility], [address], [place], [place] [address]    Report Status PENDING  Incomplete  Blood culture (routine x 2)     Status: None (Preliminary result)   Collection Time: 07/04/21  5:54 AM   Specimen: BLOOD  Result Value Ref Range Status   Specimen Description BLOOD RIGHT ANTECUBITAL  Final   Special Requests   Final    BOTTLES DRAWN AEROBIC AND ANAEROBIC Blood Culture results may not be optimal due to an excessive volume of blood received in culture bottles   Culture   Final    NO GROWTH 1 DAY Performed at [facility], [address] [place], [place] [address]    Report Status PENDING  Incomplete     Radiology Studies: CT HEAD WO CONTRAST (5MM)  Result Date: 07/04/2021 CLINICAL DATA:  Delirium EXAM: CT HEAD WITHOUT CONTRAST TECHNIQUE: Contiguous axial images were  obtained from the base of the skull through the vertex without intravenous contrast. COMPARISON:  01/07/2019 FINDINGS: Brain: There is no mass, hemorrhage or extra-axial collection. There is generalized atrophy without lobar predilection. Hypodensity of the white matter is most commonly associated with chronic microvascular disease. Vascular: Atherosclerotic calcification of the internal carotid arteries at the skull base. No abnormal hyperdensity of the major intracranial arteries or dural venous sinuses. Skull: The visualized skull base, calvarium and extracranial soft tissues are normal. Sinuses/Orbits: No fluid levels or advanced mucosal thickening of the visualized paranasal sinuses. No mastoid or middle ear effusion. The orbits are normal. IMPRESSION: Generalized atrophy and chronic microvascular ischemia without acute intracranial abnormality. Electronically Signed   By: Ulyses Jarred M.D.   On: 07/04/2021 03:58   CT CHEST W CONTRAST  Result Date: 07/04/2021 CLINICAL DATA:  Sepsis.  Evaluate for abscess. EXAM: CT CHEST, ABDOMEN, AND PELVIS WITH CONTRAST TECHNIQUE: Multidetector CT imaging of the chest, abdomen and pelvis was performed following  the standard protocol during bolus administration of intravenous contrast. CONTRAST:  35m OMNIPAQUE IOHEXOL 350 MG/ML SOLN COMPARISON:  None. FINDINGS: CT CHEST FINDINGS Cardiovascular: The heart size is normal. No substantial pericardial effusion. Coronary artery calcification is evident. Insert calcium thoracic Mediastinum/Nodes: No mediastinal lymphadenopathy. There is no hilar lymphadenopathy. Moderate hiatal hernia. The esophagus has normal imaging features. There is no axillary lymphadenopathy. Lungs/Pleura: Biapical pleuroparenchymal scarring evident. Patchy peribronchovascular ground-glass nodularity identified posterior lower lobes bilaterally (66/7 on the right and 59/7 on the left. Focal area of ground-glass opacity identified posterior left upper lobe  along the fissure. No dense focal airspace consolidation. No pneumothorax or substantial pleural effusion. Musculoskeletal: No worrisome lytic or sclerotic osseous abnormality. CT ABDOMEN PELVIS FINDINGS Hepatobiliary: No suspicious focal abnormality within the liver parenchyma. There is no evidence for gallstones, gallbladder wall thickening, or pericholecystic fluid. No intrahepatic or extrahepatic biliary dilation. Pancreas: No focal mass lesion. No dilatation of the main duct. No intraparenchymal cyst. No peripancreatic edema. Spleen: No splenomegaly. No focal mass lesion. Adrenals/Urinary Tract: No adrenal nodule or mass. Central sinus cysts noted in the kidneys bilaterally. No evidence for hydroureter. Bladder is partially obscured by beam hardening artifact from right hip replacement. Stomach/Bowel: Moderate hiatal hernia. Stomach decompressed. Duodenum is normally positioned as is the ligament of Treitz. No small bowel wall thickening. No small bowel dilatation. The terminal ileum is normal. The appendix is not well visualized, but there is no edema or inflammation in the region of the cecum. Colon is diffusely decompressed, likely accentuating wall thickening noted in the right and transverse colon although. Mild diverticular disease noted sigmoid colon without diverticulitis. Vascular/Lymphatic: There is advanced atherosclerotic calcification of the abdominal aorta without aneurysm. There is no gastrohepatic or hepatoduodenal ligament lymphadenopathy. No retroperitoneal or mesenteric lymphadenopathy. No pelvic sidewall lymphadenopathy. Reproductive: The uterus is unremarkable.  There is no adnexal mass. Other: No intraperitoneal free fluid. Musculoskeletal: Status post right hip replacement. Bones are diffusely demineralized. No worrisome lytic or sclerotic osseous abnormality. Compression deformity noted at T8 with inferior endplate compression noted at T12. Variable loss of vertebral body height at all  levels from L1-L5, consistent with compression fractures. IMPRESSION: 1. Patchy peribronchovascular ground-glass nodularity posterior lower lobes bilaterally with more focal area of ground-glass opacity posterior left upper lobe. Imaging features likely related to infectious/inflammatory etiology. Atypical infection (including viral) should be considered. 2. No evidence for abscess in the abdomen/pelvis. 3. Moderate hiatal hernia. 4. Mild sigmoid diverticulosis without diverticulitis. The appearance of wall thickening in the right and transverse colon likely related to the decompressed state. Right-sided colitis is not considered likely but can not be completely excluded. 5. Osteopenia with multilevel compression fractures in the thoracic and lumbar spine. 6. Aortic Atherosclerosis (ICD10-I70.0). Electronically Signed   By: EMisty StanleyM.D.   On: 07/04/2021 07:50   CT ABDOMEN PELVIS W CONTRAST  Result Date: 07/04/2021 CLINICAL DATA:  Sepsis.  Evaluate for abscess. EXAM: CT CHEST, ABDOMEN, AND PELVIS WITH CONTRAST TECHNIQUE: Multidetector CT imaging of the chest, abdomen and pelvis was performed following the standard protocol during bolus administration of intravenous contrast. CONTRAST:  858mOMNIPAQUE IOHEXOL 350 MG/ML SOLN COMPARISON:  None. FINDINGS: CT CHEST FINDINGS Cardiovascular: The heart size is normal. No substantial pericardial effusion. Coronary artery calcification is evident. Insert calcium thoracic Mediastinum/Nodes: No mediastinal lymphadenopathy. There is no hilar lymphadenopathy. Moderate hiatal hernia. The esophagus has normal imaging features. There is no axillary lymphadenopathy. Lungs/Pleura: Biapical pleuroparenchymal scarring evident. Patchy peribronchovascular ground-glass nodularity identified posterior lower lobes  bilaterally (66/7 on the right and 59/7 on the left. Focal area of ground-glass opacity identified posterior left upper lobe along the fissure. No dense focal airspace  consolidation. No pneumothorax or substantial pleural effusion. Musculoskeletal: No worrisome lytic or sclerotic osseous abnormality. CT ABDOMEN PELVIS FINDINGS Hepatobiliary: No suspicious focal abnormality within the liver parenchyma. There is no evidence for gallstones, gallbladder wall thickening, or pericholecystic fluid. No intrahepatic or extrahepatic biliary dilation. Pancreas: No focal mass lesion. No dilatation of the main duct. No intraparenchymal cyst. No peripancreatic edema. Spleen: No splenomegaly. No focal mass lesion. Adrenals/Urinary Tract: No adrenal nodule or mass. Central sinus cysts noted in the kidneys bilaterally. No evidence for hydroureter. Bladder is partially obscured by beam hardening artifact from right hip replacement. Stomach/Bowel: Moderate hiatal hernia. Stomach decompressed. Duodenum is normally positioned as is the ligament of Treitz. No small bowel wall thickening. No small bowel dilatation. The terminal ileum is normal. The appendix is not well visualized, but there is no edema or inflammation in the region of the cecum. Colon is diffusely decompressed, likely accentuating wall thickening noted in the right and transverse colon although. Mild diverticular disease noted sigmoid colon without diverticulitis. Vascular/Lymphatic: There is advanced atherosclerotic calcification of the abdominal aorta without aneurysm. There is no gastrohepatic or hepatoduodenal ligament lymphadenopathy. No retroperitoneal or mesenteric lymphadenopathy. No pelvic sidewall lymphadenopathy. Reproductive: The uterus is unremarkable.  There is no adnexal mass. Other: No intraperitoneal free fluid. Musculoskeletal: Status post right hip replacement. Bones are diffusely demineralized. No worrisome lytic or sclerotic osseous abnormality. Compression deformity noted at T8 with inferior endplate compression noted at T12. Variable loss of vertebral body height at all levels from L1-L5, consistent with  compression fractures. IMPRESSION: 1. Patchy peribronchovascular ground-glass nodularity posterior lower lobes bilaterally with more focal area of ground-glass opacity posterior left upper lobe. Imaging features likely related to infectious/inflammatory etiology. Atypical infection (including viral) should be considered. 2. No evidence for abscess in the abdomen/pelvis. 3. Moderate hiatal hernia. 4. Mild sigmoid diverticulosis without diverticulitis. The appearance of wall thickening in the right and transverse colon likely related to the decompressed state. Right-sided colitis is not considered likely but can not be completely excluded. 5. Osteopenia with multilevel compression fractures in the thoracic and lumbar spine. 6. Aortic Atherosclerosis (ICD10-I70.0). Electronically Signed   By: Misty Stanley M.D.   On: 07/04/2021 07:50   DG Chest Portable 1 View  Result Date: 07/04/2021 CLINICAL DATA:  Shortness of breath EXAM: PORTABLE CHEST 1 VIEW COMPARISON:  09/25/2020 FINDINGS: Lungs are clear.  No pleural effusion or pneumothorax. The heart is normal in size.  Thoracic aortic atherosclerosis. IMPRESSION: No evidence of acute cardiopulmonary disease. Electronically Signed   By: Julian Hy M.D.   On: 07/04/2021 03:38     Scheduled Meds:  calcium-vitamin D   Oral Daily   enoxaparin (LOVENOX) injection  30 mg Subcutaneous Q24H   feeding supplement  237 mL Oral TID BM   gabapentin  200 mg Oral BID   levothyroxine  25 mcg Oral Daily   magnesium oxide  200 mg Oral Daily   multivitamin with minerals  1 tablet Oral Daily   pantoprazole  40 mg Oral Daily   sodium chloride flush  3 mL Intravenous Q12H   Continuous Infusions:  sodium chloride 100 mL/hr at 07/05/21 1150   sodium chloride     azithromycin 500 mg (07/05/21 1152)   cefTRIAXone (ROCEPHIN)  IV 2 g (07/05/21 1004)     LOS: 1 day  Time spent: 35 minutes    Barton Dubois, MD [facility]   To contact the attending  provider between 7A-7P or the covering provider during after hours 7P-7A, please log into the web site [email or web address] and access using universal [place] password for that web site. If you do not have the password, please call the hospital operator.  07/05/2021, 9:13 PM

## 2021-07-06 DIAGNOSIS — E860 Dehydration: Secondary | ICD-10-CM

## 2021-07-06 DIAGNOSIS — J189 Pneumonia, unspecified organism: Secondary | ICD-10-CM | POA: Diagnosis not present

## 2021-07-06 DIAGNOSIS — M8000XS Age-related osteoporosis with current pathological fracture, unspecified site, sequela: Secondary | ICD-10-CM | POA: Diagnosis not present

## 2021-07-06 DIAGNOSIS — A419 Sepsis, unspecified organism: Secondary | ICD-10-CM | POA: Diagnosis not present

## 2021-07-06 DIAGNOSIS — F419 Anxiety disorder, unspecified: Secondary | ICD-10-CM | POA: Diagnosis not present

## 2021-07-06 MED ORDER — GUAIFENESIN-DM 100-10 MG/5ML PO SYRP: 10.0000 mL | ORAL_SOLUTION | ORAL | 0 refills | Status: DC | PRN

## 2021-07-06 MED ORDER — ONDANSETRON 8 MG PO TBDP: 8.0000 mg | ORAL_TABLET | Freq: Three times a day (TID) | ORAL | 0 refills | Status: DC | PRN

## 2021-07-06 MED ORDER — CEFDINIR 250 MG/5ML PO SUSR: 300.0000 mg | Freq: Two times a day (BID) | ORAL | 0 refills | Status: AC

## 2021-07-06 NOTE — TOC Transition Note (Addendum)
Transition of Care [facility]) - CM/SW Discharge Note   Patient Details  Name: Kara [name] MRN: [id number] Date of Birth: 1927/06/06  [facility]) CM/SW Contact:  Shade Flood, LCSW Phone Number: 07/06/2021, 3:25 PM   Clinical Narrative:     Pt stable to dc home with [facility] today per MD. Damaris Schooner with pt's daughter to review dc planning. Dtr agreeable to [facility]. Discussed CMS provider options and referred to [facility] at Daughter's request. [facility] at dtr request to inquire on status of hospital bed order. Was informed by rep at [facility] that the beds have been on back order and that they should have one available by mid week next week. Updated pt's dtr.  There are no other TOC needs for dc.  1611: Notified by pt's RN that pt needs EMS transport home. Contacted [facility] and pt is on the list for transport. No ETA was given. Transport form printed to the floor and RN updated.  Expected Discharge Plan: [place] Barriers to Discharge: Barriers Resolved   Patient Goals and CMS Choice Patient states their goals for this hospitalization and ongoing recovery are:: go home CMS Medicare.gov Compare Post Acute Care list provided to:: Patient Represenative (must comment) Choice offered to / list presented to : Adult Children  Expected Discharge Plan and Services Expected Discharge Plan: [place] In-house Referral: Clinical Social Work   Post Acute Care Choice: [place] arrangements for the past 2 months: [place] Expected Discharge Date: 07/06/21                         HH Arranged: RN [place] Agency: [place] ([place]) Date HH Agency Contacted: 07/06/21   Representative spoke with at [place]: [place] Arrangements/Services Living arrangements for the past 2 months: [place] Lives with:: Adult Children Patient language and need for interpreter reviewed::  Yes Do you feel safe going back to the place where you live?: Yes      Need for Family Participation in Patient Care: Yes (Comment) Care giver support system in place?: Yes (comment) Current home services: DME Criminal Activity/Legal Involvement Pertinent to Current Situation/Hospitalization: No - Comment as needed  Activities of Daily Living Home Assistive Devices/Equipment: Dentures (specify type) ADL Screening (condition at time of admission) Patient's cognitive ability adequate to safely complete daily activities?: No Is the patient deaf or have difficulty hearing?: Yes Does the patient have difficulty seeing, even when wearing glasses/contacts?: No Does the patient have difficulty concentrating, remembering, or making decisions?: Yes Patient able to express need for assistance with ADLs?: No Does the patient have difficulty dressing or bathing?: Yes Independently performs ADLs?: No Communication: Needs assistance Is this a change from baseline?: Pre-admission baseline Dressing (OT): Dependent Is this a change from baseline?: Pre-admission baseline Grooming: Dependent Is this a change from baseline?: Pre-admission baseline Feeding: Dependent Is this a change from baseline?: Pre-admission baseline Bathing: Dependent Is this a change from baseline?: Pre-admission baseline Toileting: Dependent Is this a change from baseline?: Pre-admission baseline In/Out Bed: Dependent Is this a change from baseline?: Pre-admission baseline Walks in Home: Dependent Is this a change from baseline?: Pre-admission baseline Does the patient have difficulty walking or climbing stairs?: Yes Weakness of Legs: Both Weakness of Arms/Hands: Both  Permission Sought/Granted Permission sought to share information with : Facility Art therapist granted to share information with : Yes, Verbal Permission Granted  Permission granted to share info w AGENCY: [place], DME        Emotional  Assessment       Orientation: : Oriented to Self Alcohol / Substance Use: Not Applicable Psych Involvement: No (comment)  Admission diagnosis:  Dehydration [E86.0] Sepsis ([place]) [A41.9] Altered mental status, unspecified altered mental status type [R41.82] Community acquired pneumonia, unspecified laterality [J18.9] Patient Active Problem List   Diagnosis Date Noted   Dehydration    Protein-calorie malnutrition, severe 07/05/2021   Pressure injury of skin 07/05/2021   Sepsis secondary to CAP 07/04/2021   Dementia without behavioral disturbance ([place]) 07/04/2021   Hypothyroidism 07/04/2021   CAP (community acquired pneumonia) 07/04/2021   Heel ulcer ([place]) 05/24/2021   Irritation symptom of skin 05/24/2021   Requires daily assistance for activities of daily living (ADL) and comfort needs 05/24/2021   Back pain 09/26/2020   Incontinence 08/27/2020   Need for home health care 08/27/2020   Generalized osteoarthritis 07/31/2020   Chronic hip pain, right 01/13/2020   Dry eyes 11/07/2019   Urinary frequency 05/07/2019   Abnormal thyroid function test 05/07/2019   Underweight due to inadequate caloric intake 05/07/2019   Anxiety 05/07/2019   MCI (mild cognitive impairment) 03/20/2019   Trochanteric bursitis, right hip 08/02/2017   Closed compression fracture of L5 lumbar vertebra 05/30/2017   Sciatica of right side 04/25/2017   CAD (coronary atherosclerotic disease) 03/26/2017   Schatzki's ring    Hiatal hernia    Acute gastroenteritis 03/28/2014   Unsteady gait 01/15/2014   Cystocele, midline 09/22/2013   IGT (impaired glucose tolerance) 07/26/2013   IBS (irritable bowel syndrome) 07/01/2012   Arthritis 08/26/2011   Diverticulosis 03/29/2011   GERD (gastroesophageal reflux disease) 03/29/2011   Seasonal allergic rhinitis 01/25/2010   Uterine prolapse 08/11/2008   Hyperlipidemia LDL goal <100 01/15/2008   Age-related osteoporosis with current pathological fracture 01/15/2008    PCP:  Fayrene Helper, MD Pharmacy:   [place], [place] - [address] [place] [address] Phone: [phone] Fax: [phone]     Social Determinants of Health (SDOH) Interventions    Readmission Risk Interventions No flowsheet data found.   Final next level of care: [place] Barriers to Discharge: Barriers Resolved   Patient Goals and CMS Choice Patient states their goals for this hospitalization and ongoing recovery are:: go home CMS Medicare.gov Compare Post Acute Care list provided to:: Patient Represenative (must comment) Choice offered to / list presented to : Adult Children  Discharge Placement                       Discharge Plan and Services In-house Referral: Clinical Social Work   Post Acute Care Choice: Home Health                    HH Arranged: RN [facility] Agency: [place] ([place]) Date [place]: 07/06/21   Representative spoke with at [place]: [place] ([place]) Interventions     Readmission Risk Interventions No flowsheet data found.

## 2021-07-06 NOTE — Discharge Summary (Signed)
Physician Discharge Summary  Kara [name] LNL:[id number] DOB: 04/29/1927 DOA: 07/04/2021  PCP: Fayrene Helper, MD  Admit date: 07/04/2021 Discharge date: 07/06/2021  Time spent: 35 minutes  Recommendations for Outpatient Follow-up:  Repeat basic metabolic panel to evaluate lites and renal function Repeat chest x-ray in 6 weeks to assure resolution of infiltrates.  Discharge Diagnoses:  Principal Problem:   Sepsis secondary to CAP Active Problems:   Age-related osteoporosis with current pathological fracture   GERD (gastroesophageal reflux disease)   CAD (coronary atherosclerotic disease)   Anxiety   Dementia without behavioral disturbance (HCC)   Hypothyroidism   CAP (community acquired pneumonia)   Protein-calorie malnutrition, severe   Pressure injury of skin   Dehydration   Discharge Condition: Stable and improved; discharged home with instruction to follow-up with PCP in 10 days.  CODE STATUS: DNR  Diet recommendation: Dysphagia 2 with thin liquids  Filed Weights   07/04/21 0259 07/04/21 2045  Weight: 41.5 kg 43.6 kg    History of present illness:  As per H&P written by Dr. Denton [name] on 01/28/1447 Kara [name]  is a 87 y.o. female--- Almost 87 year old with advanced dementia with significant cognitive and memory deficits, requiring total care with primary caregiver who is her daughter lives with her at home--, as well as history of hypothyroidism, osteopenia with current compression fractures, GERD and history of CAD and anxiety disorder who is brought in to ED due to concerns of increasing confusion and decreased responsiveness cough and wheezing as well as tachypnea -As per daughter's been difficult to feed patient due to increasing confusional episodes and uncooperative behavior -In the ED she is found to be wheezy and tachypneic O2 sats around 90% on room air -Daughter reports possible fevers at home -History obtained from daughter as patient is unable to  provide any history -No concerns about chest pains No Nausea, Vomiting or Diarrhea In ED--CT chest with contrast and CT abdomen and pelvis with contrast shows groundglass nodular opacity suggestive of infection, cannot exclude inflammatory process -CT head without acute intracranial finding -UA is not suspicious for UTI -CBC with a white count of 24,000, hemoglobin 10.2 platelets 329 -Creatinine 0.61 with a glucose of 126 Initial Lactic acid 3.2, repeat after sepsis protocol IV fluids is 1.3 -EKG sinus tachycardia without acute findings  Hospital Course:  1-Sepsis secondary to CAP -Patient met sepsis criteria on admissionWith the presence of tachycardia, tachypnea, elevated WBCs and a lactic acid of 3.2. -Chest x-ray/CT demonstrating PNA -Given underlying history of dementia patient at risk for aspiration pneumonia -Per daughter patient has demonstrated no episode of vomiting and is eating/drinking better. -Patient's antibiotic has been transitioned to oral route using cefdinir suspension; 5 more days of antibiotics will be provided. -Continue supportive care and as needed antitussive's/mucolytic agents. -Repeat chest x-ray in 6 weeks to assure resolution of infiltrates.   2-Age-related osteoporosis with current pathological fracture -Continue adjusted dose of vitamin D and calcium -Continue as needed analgesics.   3-GERD (gastroesophageal reflux disease) -Continue PPI   4-Anxiety -Continue as needed clonazepam. -Overall mood stable. -No agitation or restlessness present at this time.   5-hypothyroidism -Continue Synthroid.   6-insomnia -As needed trazodone provided while inpatient. -Continue to follow good sleeping hygiene routine.   7-severe protein calorie malnutrition -In the setting of poor oral intake due to underlying dementia -Continue the use of feeding supplements in between meals; patient advised to maintain adequate nutrition while following multiple small meals  per day. -Continue to maintain adequate hydration.  8-stage I pressure injury: Sacral area present on admission -Continue constant repositioning -Continue preventive measures and barriers.    9-history of dementia without behavioral disturbance -A stable mood currently and continue supportive care and as needed anxiolytic medications per home regimen.  Procedures: See below for x-ray reports.  Consultations: None  Discharge Exam: Vitals:   07/05/21 2315 07/06/21 0700  BP: (!) 151/68 (!) 105/58  Pulse: (!) 107 69  Resp: 20   Temp: 98.3 F (36.8 C) 97.7 F (36.5 C)  SpO2: 96% 97%    General: Afebrile, no chest pain, no nausea, no vomiting.  Per daughter patient is not experiencing any further vomiting events and has been drinking/eating a little bit more today. Cardiovascular: S1 and S2, no rubs, no gallops, no JVD. Respiratory: Improved air movement bilaterally; positive rhonchi, no wheezing, no crackles. Abdomen: Soft, nontender, distended, positive bowel sounds Extremities: No cyanosis or clubbing.  Discharge Instructions   Discharge Instructions     Diet - low sodium heart healthy   Complete by: As directed    Discharge instructions   Complete by: As directed    Maintain adequate hydration To medications as prescribed Arrange follow-up with PCP in 10 days. Continue the use of feeding supplements. Follow dysphagia 2 diet.   Discharge wound care:   Complete by: As directed    Barrier creams, constant repositioning; maintain area clean and dry.      Allergies as of 07/06/2021       Reactions   Amoxicillin-pot Clavulanate Hives, Itching   Has patient had a PCN reaction causing immediate rash, facial/tongue/throat swelling, SOB or lightheadedness with hypotension: No Has patient had a PCN reaction causing severe rash involving mucus membranes or skin necrosis: No Has patient had a PCN reaction that required hospitalization: No Has patient had a PCN reaction  occurring within the last 10 years: Yes If all of the above answers are "NO", then may proceed with Cephalosporin use.   Tramadol Other (See Comments)   HALLUCINATIONS    Other Other (See Comments)   ALLERGY TO GI COCKTAIL UNSPECIFIED REACTION    Carafate [sucralfate] Other (See Comments)   Tingly sensation arms, neck, scalp.    Cetirizine Hcl Other (See Comments)   REACTION: too strong for her UNSPECIFIED REACTIONS   Mometasone Furoate Dermatitis   NASONEX        Medication List     STOP taking these medications    meloxicam 7.5 MG tablet Commonly known as: MOBIC   nitrofurantoin (macrocrystal-monohydrate) 100 MG capsule Commonly known as: Macrobid   ondansetron 4 MG tablet Commonly known as: Zofran       TAKE these medications    Benefiber Powd Take 5 mLs by mouth daily. Mixes in beverage   CALCIUM 600+D3 PO Take 1 tablet by mouth daily.   cefdinir 250 MG/5ML suspension Commonly known as: OMNICEF Take 6 mLs (300 mg total) by mouth 2 (two) times daily for 5 days.   clonazePAM 0.5 MG tablet Commonly known as: KLONOPIN Take 0.5 mg by mouth daily as needed.   gabapentin 100 MG capsule Commonly known as: NEURONTIN Take 200 mg by mouth 2 (two) times daily.   guaiFENesin-dextromethorphan 100-10 MG/5ML syrup Commonly known as: ROBITUSSIN DM Take 10 mLs by mouth every 4 (four) hours as needed for cough.   levothyroxine 25 MCG tablet Commonly known as: SYNTHROID Take 1 tablet (25 mcg total) by mouth daily.   Magnesium 250 MG Tabs Take 250 mg by mouth daily.  omeprazole 20 MG capsule Commonly known as: PRILOSEC Take 1 capsule by mouth once daily   ondansetron 8 MG disintegrating tablet Commonly known as: Zofran ODT Take 1 tablet (8 mg total) by mouth every 8 (eight) hours as needed for nausea or vomiting.   oxyCODONE-acetaminophen 10-325 MG tablet Commonly known as: PERCOCET Take 1 tablet by mouth every 8 (eight) hours as needed for severe pain.    Polyethyl Glycol-Propyl Glycol 0.4-0.3 % Soln Place 1 drop into both eyes 3 (three) times daily as needed (for dry/irritated eyes.). Systane Ultra   polyethylene glycol 17 g packet Commonly known as: MiraLax Take 17 g by mouth daily.   UNABLE TO FIND Adult Diapers Use once daily   UNABLE TO FIND Ensure (vanilla/Chocolate) Drink 1 Ensure three times daily as directed   UNABLE TO FIND Butt paste (compound) Apply to bottom daily prn   Vitamin D (Ergocalciferol) 1.25 MG (50000 UNIT) Caps capsule Commonly known as: DRISDOL Take 50,000 Units by mouth every 7 (seven) days.               Discharge Care Instructions  (From admission, onward)           Start     Ordered   07/06/21 0000  Discharge wound care:       Comments: Barrier creams, constant repositioning; maintain area clean and dry.   07/06/21 1503           Allergies  Allergen Reactions   Amoxicillin-Pot Clavulanate Hives and Itching    Has patient had a PCN reaction causing immediate rash, facial/tongue/throat swelling, SOB or lightheadedness with hypotension: No Has patient had a PCN reaction causing severe rash involving mucus membranes or skin necrosis: No Has patient had a PCN reaction that required hospitalization: No Has patient had a PCN reaction occurring within the last 10 years: Yes If all of the above answers are "NO", then may proceed with Cephalosporin use.     Tramadol Other (See Comments)    HALLUCINATIONS    Other Other (See Comments)    ALLERGY TO GI COCKTAIL UNSPECIFIED REACTION    Carafate [Sucralfate] Other (See Comments)    Tingly sensation arms, neck, scalp.    Cetirizine Hcl Other (See Comments)    REACTION: too strong for her UNSPECIFIED REACTIONS   Mometasone Furoate Dermatitis    NASONEX    Follow-up Information     Fayrene Helper, MD. Schedule an appointment as soon as possible for a visit in 10 day(s).   Specialty: Family Medicine Contact information: [address], [place] [address] [phone]                 The results of significant diagnostics from this hospitalization (including imaging, microbiology, ancillary and laboratory) are listed below for reference.    Significant Diagnostic Studies: CT HEAD WO CONTRAST (5MM)  Result Date: 07/04/2021 CLINICAL DATA:  Delirium EXAM: CT HEAD WITHOUT CONTRAST TECHNIQUE: Contiguous axial images were obtained from the base of the skull through the vertex without intravenous contrast. COMPARISON:  01/07/2019 FINDINGS: Brain: There is no mass, hemorrhage or extra-axial collection. There is generalized atrophy without lobar predilection. Hypodensity of the white matter is most commonly associated with chronic microvascular disease. Vascular: Atherosclerotic calcification of the internal carotid arteries at the skull base. No abnormal hyperdensity of the major intracranial arteries or dural venous sinuses. Skull: The visualized skull base, calvarium and extracranial soft tissues are normal. Sinuses/Orbits: No fluid levels or advanced  mucosal thickening of the visualized paranasal sinuses. No mastoid or middle ear effusion. The orbits are normal. IMPRESSION: Generalized atrophy and chronic microvascular ischemia without acute intracranial abnormality. Electronically Signed   By: Ulyses Jarred M.D.   On: 07/04/2021 03:58   CT CHEST W CONTRAST  Result Date: 07/04/2021 CLINICAL DATA:  Sepsis.  Evaluate for abscess. EXAM: CT CHEST, ABDOMEN, AND PELVIS WITH CONTRAST TECHNIQUE: Multidetector CT imaging of the chest, abdomen and pelvis was performed following the standard protocol during bolus administration of intravenous contrast. CONTRAST:  79m OMNIPAQUE IOHEXOL 350 MG/ML SOLN COMPARISON:  None. FINDINGS: CT CHEST FINDINGS Cardiovascular: The heart size is normal. No substantial pericardial effusion. Coronary artery calcification is evident. Insert calcium thoracic Mediastinum/Nodes: No  mediastinal lymphadenopathy. There is no hilar lymphadenopathy. Moderate hiatal hernia. The esophagus has normal imaging features. There is no axillary lymphadenopathy. Lungs/Pleura: Biapical pleuroparenchymal scarring evident. Patchy peribronchovascular ground-glass nodularity identified posterior lower lobes bilaterally (66/7 on the right and 59/7 on the left. Focal area of ground-glass opacity identified posterior left upper lobe along the fissure. No dense focal airspace consolidation. No pneumothorax or substantial pleural effusion. Musculoskeletal: No worrisome lytic or sclerotic osseous abnormality. CT ABDOMEN PELVIS FINDINGS Hepatobiliary: No suspicious focal abnormality within the liver parenchyma. There is no evidence for gallstones, gallbladder wall thickening, or pericholecystic fluid. No intrahepatic or extrahepatic biliary dilation. Pancreas: No focal mass lesion. No dilatation of the main duct. No intraparenchymal cyst. No peripancreatic edema. Spleen: No splenomegaly. No focal mass lesion. Adrenals/Urinary Tract: No adrenal nodule or mass. Central sinus cysts noted in the kidneys bilaterally. No evidence for hydroureter. Bladder is partially obscured by beam hardening artifact from right hip replacement. Stomach/Bowel: Moderate hiatal hernia. Stomach decompressed. Duodenum is normally positioned as is the ligament of Treitz. No small bowel wall thickening. No small bowel dilatation. The terminal ileum is normal. The appendix is not well visualized, but there is no edema or inflammation in the region of the cecum. Colon is diffusely decompressed, likely accentuating wall thickening noted in the right and transverse colon although. Mild diverticular disease noted sigmoid colon without diverticulitis. Vascular/Lymphatic: There is advanced atherosclerotic calcification of the abdominal aorta without aneurysm. There is no gastrohepatic or hepatoduodenal ligament lymphadenopathy. No retroperitoneal or  mesenteric lymphadenopathy. No pelvic sidewall lymphadenopathy. Reproductive: The uterus is unremarkable.  There is no adnexal mass. Other: No intraperitoneal free fluid. Musculoskeletal: Status post right hip replacement. Bones are diffusely demineralized. No worrisome lytic or sclerotic osseous abnormality. Compression deformity noted at T8 with inferior endplate compression noted at T12. Variable loss of vertebral body height at all levels from L1-L5, consistent with compression fractures. IMPRESSION: 1. Patchy peribronchovascular ground-glass nodularity posterior lower lobes bilaterally with more focal area of ground-glass opacity posterior left upper lobe. Imaging features likely related to infectious/inflammatory etiology. Atypical infection (including viral) should be considered. 2. No evidence for abscess in the abdomen/pelvis. 3. Moderate hiatal hernia. 4. Mild sigmoid diverticulosis without diverticulitis. The appearance of wall thickening in the right and transverse colon likely related to the decompressed state. Right-sided colitis is not considered likely but can not be completely excluded. 5. Osteopenia with multilevel compression fractures in the thoracic and lumbar spine. 6. Aortic Atherosclerosis (ICD10-I70.0). Electronically Signed   By: EMisty StanleyM.D.   On: 07/04/2021 07:50   CT ABDOMEN PELVIS W CONTRAST  Result Date: 07/04/2021 CLINICAL DATA:  Sepsis.  Evaluate for abscess. EXAM: CT CHEST, ABDOMEN, AND PELVIS WITH CONTRAST TECHNIQUE: Multidetector CT imaging of the chest, abdomen and pelvis  was performed following the standard protocol during bolus administration of intravenous contrast. CONTRAST:  23m OMNIPAQUE IOHEXOL 350 MG/ML SOLN COMPARISON:  None. FINDINGS: CT CHEST FINDINGS Cardiovascular: The heart size is normal. No substantial pericardial effusion. Coronary artery calcification is evident. Insert calcium thoracic Mediastinum/Nodes: No mediastinal lymphadenopathy. There is no  hilar lymphadenopathy. Moderate hiatal hernia. The esophagus has normal imaging features. There is no axillary lymphadenopathy. Lungs/Pleura: Biapical pleuroparenchymal scarring evident. Patchy peribronchovascular ground-glass nodularity identified posterior lower lobes bilaterally (66/7 on the right and 59/7 on the left. Focal area of ground-glass opacity identified posterior left upper lobe along the fissure. No dense focal airspace consolidation. No pneumothorax or substantial pleural effusion. Musculoskeletal: No worrisome lytic or sclerotic osseous abnormality. CT ABDOMEN PELVIS FINDINGS Hepatobiliary: No suspicious focal abnormality within the liver parenchyma. There is no evidence for gallstones, gallbladder wall thickening, or pericholecystic fluid. No intrahepatic or extrahepatic biliary dilation. Pancreas: No focal mass lesion. No dilatation of the main duct. No intraparenchymal cyst. No peripancreatic edema. Spleen: No splenomegaly. No focal mass lesion. Adrenals/Urinary Tract: No adrenal nodule or mass. Central sinus cysts noted in the kidneys bilaterally. No evidence for hydroureter. Bladder is partially obscured by beam hardening artifact from right hip replacement. Stomach/Bowel: Moderate hiatal hernia. Stomach decompressed. Duodenum is normally positioned as is the ligament of Treitz. No small bowel wall thickening. No small bowel dilatation. The terminal ileum is normal. The appendix is not well visualized, but there is no edema or inflammation in the region of the cecum. Colon is diffusely decompressed, likely accentuating wall thickening noted in the right and transverse colon although. Mild diverticular disease noted sigmoid colon without diverticulitis. Vascular/Lymphatic: There is advanced atherosclerotic calcification of the abdominal aorta without aneurysm. There is no gastrohepatic or hepatoduodenal ligament lymphadenopathy. No retroperitoneal or mesenteric lymphadenopathy. No pelvic  sidewall lymphadenopathy. Reproductive: The uterus is unremarkable.  There is no adnexal mass. Other: No intraperitoneal free fluid. Musculoskeletal: Status post right hip replacement. Bones are diffusely demineralized. No worrisome lytic or sclerotic osseous abnormality. Compression deformity noted at T8 with inferior endplate compression noted at T12. Variable loss of vertebral body height at all levels from L1-L5, consistent with compression fractures. IMPRESSION: 1. Patchy peribronchovascular ground-glass nodularity posterior lower lobes bilaterally with more focal area of ground-glass opacity posterior left upper lobe. Imaging features likely related to infectious/inflammatory etiology. Atypical infection (including viral) should be considered. 2. No evidence for abscess in the abdomen/pelvis. 3. Moderate hiatal hernia. 4. Mild sigmoid diverticulosis without diverticulitis. The appearance of wall thickening in the right and transverse colon likely related to the decompressed state. Right-sided colitis is not considered likely but can not be completely excluded. 5. Osteopenia with multilevel compression fractures in the thoracic and lumbar spine. 6. Aortic Atherosclerosis (ICD10-I70.0). Electronically Signed   By: EMisty StanleyM.D.   On: 07/04/2021 07:50   DG Chest Portable 1 View  Result Date: 07/04/2021 CLINICAL DATA:  Shortness of breath EXAM: PORTABLE CHEST 1 VIEW COMPARISON:  09/25/2020 FINDINGS: Lungs are clear.  No pleural effusion or pneumothorax. The heart is normal in size.  Thoracic aortic atherosclerosis. IMPRESSION: No evidence of acute cardiopulmonary disease. Electronically Signed   By: SJulian HyM.D.   On: 07/04/2021 03:38    Microbiology: Recent Results (from the past 240 hour(s))  Resp Panel by RT-PCR (Flu A&B, Covid) Nasopharyngeal Swab     Status: None   Collection Time: 07/04/21  3:20 AM   Specimen: Nasopharyngeal Swab; Nasopharyngeal(NP) swabs in vial transport medium   Result  Value Ref Range Status   SARS Coronavirus 2 by RT PCR NEGATIVE NEGATIVE Final    Comment: (NOTE) SARS-CoV-2 target nucleic acids are NOT DETECTED.  The SARS-CoV-2 RNA is generally detectable in upper respiratory specimens during the acute phase of infection. The lowest concentration of SARS-CoV-2 viral copies this assay can detect is 138 copies/mL. A negative result does not preclude SARS-Cov-2 infection and should not be used as the sole basis for treatment or other patient management decisions. A negative result may occur with  improper specimen collection/handling, submission of specimen other than nasopharyngeal swab, presence of viral mutation(s) within the areas targeted by this assay, and inadequate number of viral copies(<138 copies/mL). A negative result must be combined with clinical observations, patient history, and epidemiological information. The expected result is Negative.  Fact Sheet for Patients:  EntrepreneurPulse.com.au  Fact Sheet for Healthcare Providers:  IncredibleEmployment.be  This test is no t yet approved or cleared by the [place] FDA and  has been authorized for detection and/or diagnosis of SARS-CoV-2 by FDA under an Emergency Use Authorization (EUA). This EUA will remain  in effect (meaning this test can be used) for the duration of the COVID-19 declaration under Section 564(b)(1) of the Act, 21 U.S.C.section 360bbb-3(b)(1), unless the authorization is terminated  or revoked sooner.       Influenza A by PCR NEGATIVE NEGATIVE Final   Influenza B by PCR NEGATIVE NEGATIVE Final    Comment: (NOTE) The Xpert Xpress SARS-CoV-2/FLU/RSV plus assay is intended as an aid in the diagnosis of influenza from Nasopharyngeal swab specimens and should not be used as a sole basis for treatment. Nasal washings and aspirates are unacceptable for Xpert Xpress SARS-CoV-2/FLU/RSV testing.  Fact Sheet for  Patients: EntrepreneurPulse.com.au  Fact Sheet for Healthcare Providers: IncredibleEmployment.be  This test is not yet approved or cleared by the [place] FDA and has been authorized for detection and/or diagnosis of SARS-CoV-2 by FDA under an Emergency Use Authorization (EUA). This EUA will remain in effect (meaning this test can be used) for the duration of the COVID-19 declaration under Section 564(b)(1) of the Act, 21 U.S.C. section 360bbb-3(b)(1), unless the authorization is terminated or revoked.  Performed at [facility], [address], [place], [place] [address]   Blood culture (routine x 2)     Status: None (Preliminary result)   Collection Time: 07/04/21  5:54 AM   Specimen: BLOOD  Result Value Ref Range Status   Specimen Description BLOOD LEFT ANTECUBITAL  Final   Special Requests   Final    BOTTLES DRAWN AEROBIC AND ANAEROBIC Blood Culture adequate volume   Culture   Final    NO GROWTH 2 DAYS Performed at [facility], [address], [place], [place] [address]    Report Status PENDING  Incomplete  Blood culture (routine x 2)     Status: None (Preliminary result)   Collection Time: 07/04/21  5:54 AM   Specimen: BLOOD  Result Value Ref Range Status   Specimen Description BLOOD RIGHT ANTECUBITAL  Final   Special Requests   Final    BOTTLES DRAWN AEROBIC AND ANAEROBIC Blood Culture results may not be optimal due to an excessive volume of blood received in culture bottles   Culture   Final    NO GROWTH 2 DAYS Performed at [facility], [address] [place],  [address]    Report Status PENDING  Incomplete     Labs: Basic Metabolic Panel: Recent Labs  Lab 07/04/21 0448 07/05/21  0552  NA 144 142  K 4.3 3.5  CL 111 114*  CO2 23 21*  GLUCOSE 126* 86  BUN 12 8  CREATININE 0.61 0.33*  CALCIUM 8.4* 8.3*   Liver Function Tests: Recent Labs  Lab 07/04/21 0448  AST 25  ALT 13  ALKPHOS 67  BILITOT 1.0  PROT  6.1*  ALBUMIN 2.8*   CBC: Recent Labs  Lab 07/04/21 0448 07/05/21 0552  WBC 24.0* 12.0*  HGB 10.2* 7.9*  HCT 32.9* 25.5*  MCV 104.8* 102.8*  PLT 329 235    CBG: Recent Labs  Lab 07/04/21 0342  GLUCAP 124*    Signed:  Barton Dubois MD.  [facility] 07/06/2021, 3:16 PM

## 2021-07-07 ENCOUNTER — Telehealth: Payer: Self-pay

## 2021-07-07 NOTE — Telephone Encounter (Signed)
Transition Care Management Follow-up Telephone Call Date of discharge and from where: 07/06/21 [facility]  How have you been since you were released from the hospital? Doing ok very little cough night and days mixed up Any questions or concerns? No  Items Reviewed: Did the pt receive and understand the discharge instructions provided? Yes  Medications obtained and verified? Yes  Other? No  Any new allergies since your discharge? No  Dietary orders reviewed? Yes Do you have support at home? Yes   Home Care and Equipment/Supplies: Were home health services ordered? no If so, what is the name of the agency? N/a  Has the agency set up a time to come to the patient's home? not applicable Were any new equipment or medical supplies ordered?  No What is the name of the medical supply agency? N/a Were you able to get the supplies/equipment? no Do you have any questions related to the use of the equipment or supplies? No  Functional Questionnaire: (I = Independent and D = Dependent) ADLs: d  Bathing/Dressing- d  Meal Prep- d  Eating- d  Maintaining continence- d  Transferring/Ambulation- d  Managing Meds- d  Follow up appointments reviewed:  PCP Hospital f/u appt confirmed? Yes  Scheduled to see SIMPSON on 07/06/21 @ 4:00PM. Specialist Hospital f/u appt confirmed? No  Scheduled to see  on  @ . Are transportation arrangements needed? No  If their condition worsens, is the pt aware to call PCP or go to the Emergency Dept.? Yes Was the patient provided with contact information for the PCP's office or ED? Yes Was to pt encouraged to call back with questions or concerns? Yes

## 2021-07-08 DIAGNOSIS — L89151 Pressure ulcer of sacral region, stage 1: Secondary | ICD-10-CM | POA: Diagnosis not present

## 2021-07-08 DIAGNOSIS — M8008XD Age-related osteoporosis with current pathological fracture, vertebra(e), subsequent encounter for fracture with routine healing: Secondary | ICD-10-CM | POA: Diagnosis not present

## 2021-07-08 DIAGNOSIS — E46 Unspecified protein-calorie malnutrition: Secondary | ICD-10-CM | POA: Diagnosis not present

## 2021-07-08 DIAGNOSIS — Z993 Dependence on wheelchair: Secondary | ICD-10-CM | POA: Diagnosis not present

## 2021-07-08 DIAGNOSIS — K219 Gastro-esophageal reflux disease without esophagitis: Secondary | ICD-10-CM | POA: Diagnosis not present

## 2021-07-08 DIAGNOSIS — A419 Sepsis, unspecified organism: Secondary | ICD-10-CM | POA: Diagnosis not present

## 2021-07-08 DIAGNOSIS — J189 Pneumonia, unspecified organism: Secondary | ICD-10-CM | POA: Diagnosis not present

## 2021-07-08 DIAGNOSIS — E039 Hypothyroidism, unspecified: Secondary | ICD-10-CM | POA: Diagnosis not present

## 2021-07-08 DIAGNOSIS — F0280 Dementia in other diseases classified elsewhere without behavioral disturbance: Secondary | ICD-10-CM | POA: Diagnosis not present

## 2021-07-08 DIAGNOSIS — F419 Anxiety disorder, unspecified: Secondary | ICD-10-CM | POA: Diagnosis not present

## 2021-07-08 DIAGNOSIS — G4700 Insomnia, unspecified: Secondary | ICD-10-CM | POA: Diagnosis not present

## 2021-07-08 DIAGNOSIS — E860 Dehydration: Secondary | ICD-10-CM | POA: Diagnosis not present

## 2021-07-08 DIAGNOSIS — R1310 Dysphagia, unspecified: Secondary | ICD-10-CM | POA: Diagnosis not present

## 2021-07-08 DIAGNOSIS — I2510 Atherosclerotic heart disease of native coronary artery without angina pectoris: Secondary | ICD-10-CM | POA: Diagnosis not present

## 2021-07-09 LAB — CULTURE, BLOOD (ROUTINE X 2)
Culture: NO GROWTH
Culture: NO GROWTH
Special Requests: ADEQUATE

## 2021-07-11 ENCOUNTER — Telehealth (INDEPENDENT_AMBULATORY_CARE_PROVIDER_SITE_OTHER): Payer: Medicare Other | Admitting: Family Medicine

## 2021-07-11 ENCOUNTER — Other Ambulatory Visit: Payer: Self-pay

## 2021-07-11 ENCOUNTER — Encounter: Payer: Self-pay | Admitting: Family Medicine

## 2021-07-11 VITALS — Ht 60.0 in | Wt 100.0 lb

## 2021-07-11 DIAGNOSIS — J189 Pneumonia, unspecified organism: Secondary | ICD-10-CM | POA: Diagnosis not present

## 2021-07-11 DIAGNOSIS — E46 Unspecified protein-calorie malnutrition: Secondary | ICD-10-CM | POA: Diagnosis not present

## 2021-07-11 DIAGNOSIS — Z09 Encounter for follow-up examination after completed treatment for conditions other than malignant neoplasm: Secondary | ICD-10-CM

## 2021-07-11 DIAGNOSIS — E039 Hypothyroidism, unspecified: Secondary | ICD-10-CM | POA: Diagnosis not present

## 2021-07-11 DIAGNOSIS — L89151 Pressure ulcer of sacral region, stage 1: Secondary | ICD-10-CM | POA: Diagnosis not present

## 2021-07-11 DIAGNOSIS — E43 Unspecified severe protein-calorie malnutrition: Secondary | ICD-10-CM

## 2021-07-11 DIAGNOSIS — A419 Sepsis, unspecified organism: Secondary | ICD-10-CM

## 2021-07-11 DIAGNOSIS — F0280 Dementia in other diseases classified elsewhere without behavioral disturbance: Secondary | ICD-10-CM | POA: Diagnosis not present

## 2021-07-11 NOTE — Patient Instructions (Addendum)
F/u virtual visit in 8 weeks, call if you need me sooner  H/h nurse to do cbc and diff and chem 7 and eGFRon 8/22 in the morning  and send results to th office on the same day  Mobile cXR to be ordered for November 3 re evaluate pneumonia  Thankful you are improving  Flu vaccine by H/H nurse 2nd week in October  Nurse please co ordinate with h/h nurse for tests to be done nd order the tests  Thanks for choosing [facility], we consider it a privelige to serve you.

## 2021-07-11 NOTE — Progress Notes (Signed)
Virtual Visit via Telephone Note  I connected with  Kara [name], daughter and ALEASE FAIT on 11/94/17 at  4:00 PM EDT by telephone and verified that I am speaking with the correct person using two identifiers.  Location: Patient: home Provider: office   I discussed the limitations, risks, security and privacy concerns of performing an evaluation and management service by telephone and the availability of in person appointments. I also discussed with the patient that there may be a patient responsible charge related to this service. The patient expressed understanding and agreed to proceed.   History of Present Illness: F/u recent hospitalization from 9/13  to 07/06/2021 with dx of CAP with sepsis Report is improved appetite and energy, no fever or chills, no significant cough, noted to have less difficulty swallowing current diet   Observations/Objective: Ht 5' (1.524 m)   Wt 100 lb (45.4 kg)   BMI 19.53 kg/m  No signs of respiratory distress during speech   Assessment and Plan: Hospital discharge follow-up Patient in for follow up of recent hospitalization. Discharge summary, and laboratory and radiology data are reviewed, and any questions or concerns  are discussed. Specific issues requiring follow up are specifically addressed.   Protein-calorie malnutrition, severe Increased intake reported since discharge, encouraged daughter to offer small quantities frequently and monitor closely for choking and/or aspiration   Sepsis secondary to CAP No fever since d/c and slightly improved oral intake also noted with mild improvement in energy level   Follow Up Instructions:    I discussed the assessment and treatment plan with the patient. The patient was provided an opportunity to ask questions and all were answered. The patient agreed with the plan and demonstrated an understanding of the instructions.   The patient was advised to call back or seek an in-person evaluation  if the symptoms worsen or if the condition fails to improve as anticipated.  I provided 21  minutes of non-face-to-face time during this encounter.   [name], MD

## 2021-07-12 ENCOUNTER — Other Ambulatory Visit: Payer: Self-pay

## 2021-07-12 DIAGNOSIS — J189 Pneumonia, unspecified organism: Secondary | ICD-10-CM

## 2021-07-13 ENCOUNTER — Other Ambulatory Visit (HOSPITAL_COMMUNITY)
Admission: RE | Admit: 2021-07-13 | Discharge: 2021-07-13 | Disposition: A | Discharge status: Home / Self Care | Payer: Medicare Other | Source: Other Acute Inpatient Hospital | Attending: Family Medicine | Admitting: Family Medicine

## 2021-07-13 DIAGNOSIS — J189 Pneumonia, unspecified organism: Secondary | ICD-10-CM | POA: Insufficient documentation

## 2021-07-13 DIAGNOSIS — E46 Unspecified protein-calorie malnutrition: Secondary | ICD-10-CM | POA: Diagnosis not present

## 2021-07-13 DIAGNOSIS — E039 Hypothyroidism, unspecified: Secondary | ICD-10-CM | POA: Diagnosis not present

## 2021-07-13 DIAGNOSIS — L89151 Pressure ulcer of sacral region, stage 1: Secondary | ICD-10-CM | POA: Diagnosis not present

## 2021-07-13 DIAGNOSIS — A419 Sepsis, unspecified organism: Secondary | ICD-10-CM | POA: Diagnosis not present

## 2021-07-13 DIAGNOSIS — F0280 Dementia in other diseases classified elsewhere without behavioral disturbance: Secondary | ICD-10-CM | POA: Diagnosis not present

## 2021-07-13 LAB — CBC WITH DIFFERENTIAL/PLATELET
Abs Immature Granulocytes: 0.02 10*3/uL (ref 0.00–0.07)
Basophils Absolute: 0.1 10*3/uL (ref 0.0–0.1)
Basophils Relative: 1 %
Eosinophils Absolute: 0.2 10*3/uL (ref 0.0–0.5)
Eosinophils Relative: 4 %
HCT: 28.8 % — ABNORMAL LOW (ref 36.0–46.0)
Hemoglobin: 9.3 g/dL — ABNORMAL LOW (ref 12.0–15.0)
Immature Granulocytes: 0 %
Lymphocytes Relative: 54 %
Lymphs Abs: 3 10*3/uL (ref 0.7–4.0)
MCH: 32.5 pg (ref 26.0–34.0)
MCHC: 32.3 g/dL (ref 30.0–36.0)
MCV: 100.7 fL — ABNORMAL HIGH (ref 80.0–100.0)
Monocytes Absolute: 0.8 10*3/uL (ref 0.1–1.0)
Monocytes Relative: 13 %
Neutro Abs: 1.6 10*3/uL — ABNORMAL LOW (ref 1.7–7.7)
Neutrophils Relative %: 28 %
Platelets: 337 10*3/uL (ref 150–400)
RBC: 2.86 MIL/uL — ABNORMAL LOW (ref 3.87–5.11)
RDW: 20.7 % — ABNORMAL HIGH (ref 11.5–15.5)
WBC: 5.7 10*3/uL (ref 4.0–10.5)
nRBC: 0 % (ref 0.0–0.2)

## 2021-07-13 LAB — BASIC METABOLIC PANEL
Anion gap: 8 (ref 5–15)
BUN: <5 mg/dL — ABNORMAL LOW (ref 8–23)
CO2: 31 mmol/L (ref 22–32)
Calcium: 8.2 mg/dL — ABNORMAL LOW (ref 8.9–10.3)
Chloride: 101 mmol/L (ref 98–111)
Creatinine, Ser: 0.4 mg/dL — ABNORMAL LOW (ref 0.44–1.00)
GFR, Estimated: >60 mL/min (ref >60)
Glucose, Bld: 82 mg/dL (ref 70–99)
Potassium: 2.8 mmol/L — ABNORMAL LOW (ref 3.5–5.1)
Sodium: 140 mmol/L (ref 135–145)

## 2021-07-13 MED ORDER — POTASSIUM CITRATE-CITRIC ACID 1100-334 MG/5ML PO SOLN: 10.0000 meq | Freq: Three times a day (TID) | ORAL | 0 refills | Status: AC

## 2021-07-16 ENCOUNTER — Encounter: Payer: Self-pay | Admitting: Family Medicine

## 2021-07-16 DIAGNOSIS — Z09 Encounter for follow-up examination after completed treatment for conditions other than malignant neoplasm: Secondary | ICD-10-CM | POA: Insufficient documentation

## 2021-07-16 NOTE — Assessment & Plan Note (Signed)
Patient in for follow up of recent hospitalization. Discharge summary, and laboratory and radiology data are reviewed, and any questions or concerns  are discussed. Specific issues requiring follow up are specifically addressed.  

## 2021-07-16 NOTE — Assessment & Plan Note (Signed)
Increased intake reported since discharge, encouraged daughter to offer small quantities frequently and monitor closely for choking and/or aspiration

## 2021-07-16 NOTE — Assessment & Plan Note (Signed)
No fever since d/c and slightly improved oral intake also noted with mild improvement in energy level

## 2021-07-17 DIAGNOSIS — E46 Unspecified protein-calorie malnutrition: Secondary | ICD-10-CM | POA: Diagnosis not present

## 2021-07-17 DIAGNOSIS — E039 Hypothyroidism, unspecified: Secondary | ICD-10-CM | POA: Diagnosis not present

## 2021-07-17 DIAGNOSIS — J189 Pneumonia, unspecified organism: Secondary | ICD-10-CM | POA: Diagnosis not present

## 2021-07-17 DIAGNOSIS — A419 Sepsis, unspecified organism: Secondary | ICD-10-CM | POA: Diagnosis not present

## 2021-07-17 DIAGNOSIS — F0280 Dementia in other diseases classified elsewhere without behavioral disturbance: Secondary | ICD-10-CM | POA: Diagnosis not present

## 2021-07-17 DIAGNOSIS — L89151 Pressure ulcer of sacral region, stage 1: Secondary | ICD-10-CM | POA: Diagnosis not present

## 2021-07-19 DIAGNOSIS — F0280 Dementia in other diseases classified elsewhere without behavioral disturbance: Secondary | ICD-10-CM | POA: Diagnosis not present

## 2021-07-19 DIAGNOSIS — J189 Pneumonia, unspecified organism: Secondary | ICD-10-CM | POA: Diagnosis not present

## 2021-07-19 DIAGNOSIS — A419 Sepsis, unspecified organism: Secondary | ICD-10-CM | POA: Diagnosis not present

## 2021-07-19 DIAGNOSIS — E039 Hypothyroidism, unspecified: Secondary | ICD-10-CM | POA: Diagnosis not present

## 2021-07-19 DIAGNOSIS — L89151 Pressure ulcer of sacral region, stage 1: Secondary | ICD-10-CM | POA: Diagnosis not present

## 2021-07-19 DIAGNOSIS — E46 Unspecified protein-calorie malnutrition: Secondary | ICD-10-CM | POA: Diagnosis not present

## 2021-07-20 DIAGNOSIS — G4700 Insomnia, unspecified: Secondary | ICD-10-CM | POA: Diagnosis not present

## 2021-07-20 DIAGNOSIS — E860 Dehydration: Secondary | ICD-10-CM

## 2021-07-20 DIAGNOSIS — K219 Gastro-esophageal reflux disease without esophagitis: Secondary | ICD-10-CM | POA: Diagnosis not present

## 2021-07-20 DIAGNOSIS — F0280 Dementia in other diseases classified elsewhere without behavioral disturbance: Secondary | ICD-10-CM | POA: Diagnosis not present

## 2021-07-20 DIAGNOSIS — E039 Hypothyroidism, unspecified: Secondary | ICD-10-CM | POA: Diagnosis not present

## 2021-07-20 DIAGNOSIS — J189 Pneumonia, unspecified organism: Secondary | ICD-10-CM | POA: Diagnosis not present

## 2021-07-20 DIAGNOSIS — R1310 Dysphagia, unspecified: Secondary | ICD-10-CM | POA: Diagnosis not present

## 2021-07-20 DIAGNOSIS — M8008XD Age-related osteoporosis with current pathological fracture, vertebra(e), subsequent encounter for fracture with routine healing: Secondary | ICD-10-CM | POA: Diagnosis not present

## 2021-07-20 DIAGNOSIS — E46 Unspecified protein-calorie malnutrition: Secondary | ICD-10-CM | POA: Diagnosis not present

## 2021-07-20 DIAGNOSIS — Z993 Dependence on wheelchair: Secondary | ICD-10-CM

## 2021-07-20 DIAGNOSIS — F419 Anxiety disorder, unspecified: Secondary | ICD-10-CM | POA: Diagnosis not present

## 2021-07-20 DIAGNOSIS — A419 Sepsis, unspecified organism: Secondary | ICD-10-CM | POA: Diagnosis not present

## 2021-07-20 DIAGNOSIS — L89151 Pressure ulcer of sacral region, stage 1: Secondary | ICD-10-CM | POA: Diagnosis not present

## 2021-07-20 DIAGNOSIS — I2510 Atherosclerotic heart disease of native coronary artery without angina pectoris: Secondary | ICD-10-CM | POA: Diagnosis not present

## 2021-07-24 DIAGNOSIS — J189 Pneumonia, unspecified organism: Secondary | ICD-10-CM | POA: Diagnosis not present

## 2021-07-24 DIAGNOSIS — F0280 Dementia in other diseases classified elsewhere without behavioral disturbance: Secondary | ICD-10-CM | POA: Diagnosis not present

## 2021-07-24 DIAGNOSIS — A419 Sepsis, unspecified organism: Secondary | ICD-10-CM | POA: Diagnosis not present

## 2021-07-24 DIAGNOSIS — E46 Unspecified protein-calorie malnutrition: Secondary | ICD-10-CM | POA: Diagnosis not present

## 2021-07-24 DIAGNOSIS — L89151 Pressure ulcer of sacral region, stage 1: Secondary | ICD-10-CM | POA: Diagnosis not present

## 2021-07-24 DIAGNOSIS — E039 Hypothyroidism, unspecified: Secondary | ICD-10-CM | POA: Diagnosis not present

## 2021-07-26 ENCOUNTER — Other Ambulatory Visit: Payer: Self-pay

## 2021-07-26 DIAGNOSIS — I2510 Atherosclerotic heart disease of native coronary artery without angina pectoris: Secondary | ICD-10-CM

## 2021-08-01 DIAGNOSIS — L89151 Pressure ulcer of sacral region, stage 1: Secondary | ICD-10-CM | POA: Diagnosis not present

## 2021-08-01 DIAGNOSIS — E039 Hypothyroidism, unspecified: Secondary | ICD-10-CM | POA: Diagnosis not present

## 2021-08-01 DIAGNOSIS — A419 Sepsis, unspecified organism: Secondary | ICD-10-CM | POA: Diagnosis not present

## 2021-08-01 DIAGNOSIS — E46 Unspecified protein-calorie malnutrition: Secondary | ICD-10-CM | POA: Diagnosis not present

## 2021-08-01 DIAGNOSIS — J189 Pneumonia, unspecified organism: Secondary | ICD-10-CM | POA: Diagnosis not present

## 2021-08-01 DIAGNOSIS — F0280 Dementia in other diseases classified elsewhere without behavioral disturbance: Secondary | ICD-10-CM | POA: Diagnosis not present

## 2021-08-07 DIAGNOSIS — I2510 Atherosclerotic heart disease of native coronary artery without angina pectoris: Secondary | ICD-10-CM | POA: Diagnosis not present

## 2021-08-07 DIAGNOSIS — E46 Unspecified protein-calorie malnutrition: Secondary | ICD-10-CM | POA: Diagnosis not present

## 2021-08-07 DIAGNOSIS — J189 Pneumonia, unspecified organism: Secondary | ICD-10-CM | POA: Diagnosis not present

## 2021-08-07 DIAGNOSIS — F0280 Dementia in other diseases classified elsewhere without behavioral disturbance: Secondary | ICD-10-CM | POA: Diagnosis not present

## 2021-08-07 DIAGNOSIS — L89151 Pressure ulcer of sacral region, stage 1: Secondary | ICD-10-CM | POA: Diagnosis not present

## 2021-08-07 DIAGNOSIS — M8008XD Age-related osteoporosis with current pathological fracture, vertebra(e), subsequent encounter for fracture with routine healing: Secondary | ICD-10-CM | POA: Diagnosis not present

## 2021-08-07 DIAGNOSIS — K219 Gastro-esophageal reflux disease without esophagitis: Secondary | ICD-10-CM | POA: Diagnosis not present

## 2021-08-07 DIAGNOSIS — G4700 Insomnia, unspecified: Secondary | ICD-10-CM | POA: Diagnosis not present

## 2021-08-07 DIAGNOSIS — Z993 Dependence on wheelchair: Secondary | ICD-10-CM | POA: Diagnosis not present

## 2021-08-07 DIAGNOSIS — F419 Anxiety disorder, unspecified: Secondary | ICD-10-CM | POA: Diagnosis not present

## 2021-08-07 DIAGNOSIS — A419 Sepsis, unspecified organism: Secondary | ICD-10-CM | POA: Diagnosis not present

## 2021-08-07 DIAGNOSIS — R1310 Dysphagia, unspecified: Secondary | ICD-10-CM | POA: Diagnosis not present

## 2021-08-07 DIAGNOSIS — E039 Hypothyroidism, unspecified: Secondary | ICD-10-CM | POA: Diagnosis not present

## 2021-08-07 DIAGNOSIS — E860 Dehydration: Secondary | ICD-10-CM | POA: Diagnosis not present

## 2021-08-08 ENCOUNTER — Telehealth: Payer: Self-pay | Admitting: *Deleted

## 2021-08-08 NOTE — Telephone Encounter (Signed)
Transition Care Management Unsuccessful Follow-up Telephone Call  Date of discharge and from where:  [facility] 08-07-21   Attempts:  1st Attempt  Reason for unsuccessful TCM follow-up call:  Left voice message

## 2021-08-22 ENCOUNTER — Telehealth: Payer: Self-pay | Admitting: Family Medicine

## 2021-08-22 NOTE — Telephone Encounter (Signed)
Daughter called in on pt behalf about Bed sores on pt back. Sores are seeming to get worse. Instructed Daughter to upload pictures to my chart for Pt as well. Daughter wants info on what needs to be doe to have nurse come back out to the home to keep pt care up   Call back # [phone]  Mia [place] * daughter

## 2021-08-23 DIAGNOSIS — G8928 Other chronic postprocedural pain: Secondary | ICD-10-CM | POA: Diagnosis not present

## 2021-08-23 DIAGNOSIS — F419 Anxiety disorder, unspecified: Secondary | ICD-10-CM | POA: Diagnosis not present

## 2021-08-23 DIAGNOSIS — M533 Sacrococcygeal disorders, not elsewhere classified: Secondary | ICD-10-CM | POA: Diagnosis not present

## 2021-08-23 DIAGNOSIS — M8000XD Age-related osteoporosis with current pathological fracture, unspecified site, subsequent encounter for fracture with routine healing: Secondary | ICD-10-CM | POA: Diagnosis not present

## 2021-08-23 DIAGNOSIS — M5459 Other low back pain: Secondary | ICD-10-CM | POA: Diagnosis not present

## 2021-08-23 DIAGNOSIS — M130 Polyarthritis, unspecified: Secondary | ICD-10-CM | POA: Diagnosis not present

## 2021-08-23 DIAGNOSIS — M5416 Radiculopathy, lumbar region: Secondary | ICD-10-CM | POA: Diagnosis not present

## 2021-08-23 DIAGNOSIS — M25551 Pain in right hip: Secondary | ICD-10-CM | POA: Diagnosis not present

## 2021-08-23 DIAGNOSIS — M79604 Pain in right leg: Secondary | ICD-10-CM | POA: Diagnosis not present

## 2021-08-24 NOTE — Telephone Encounter (Signed)
In order to have a home health referral, will need pictures of wounds send and a video visit OR face to face visit in office before a referral can be made

## 2021-09-05 ENCOUNTER — Ambulatory Visit: Payer: Medicare Other | Admitting: Family Medicine

## 2021-09-18 ENCOUNTER — Other Ambulatory Visit: Payer: Self-pay

## 2021-09-18 ENCOUNTER — Ambulatory Visit (INDEPENDENT_AMBULATORY_CARE_PROVIDER_SITE_OTHER): Payer: Medicare Other | Admitting: Nurse Practitioner

## 2021-09-18 ENCOUNTER — Encounter: Payer: Self-pay | Admitting: Nurse Practitioner

## 2021-09-18 DIAGNOSIS — S31000D Unspecified open wound of lower back and pelvis without penetration into retroperitoneum, subsequent encounter: Secondary | ICD-10-CM

## 2021-09-18 DIAGNOSIS — S31000A Unspecified open wound of lower back and pelvis without penetration into retroperitoneum, initial encounter: Secondary | ICD-10-CM | POA: Insufficient documentation

## 2021-09-18 NOTE — Assessment & Plan Note (Signed)
Follow up with DR Nevada Crane dermatologist.  Continue daily dressing change , reposition pt every 2 hours to promote wound healing.

## 2021-09-18 NOTE — Progress Notes (Addendum)
Virtual Visit via Telephone Note  I connected with [name] daughter Kara [name] 09/18/21 at  2:40 PM EST by telephone and verified that I am speaking with the correct person using two identifiers. I spent 8 minutes talking to the pt's daughter and reviewing pt's note.   Location: Patient: home Provider: office   I discussed the limitations, risks, security and privacy concerns of performing an evaluation and management service by telephone and the availability of in person appointments. I also discussed with the patient that there may be a patient responsible charge related to this service. The patient expressed understanding and agreed to proceed.   History of Present Illness: Patient has a chronic sacral wound, she has had the wound for over 2 months, She see Dr Nevada Crane( Dermatologist for the wound) She had an RN that was coming for wound care but she stopped coming, PT daughter has been doing daily wound care dressing change. Today she noticed that the wound is tunneling and the edges look red. PT has been taking  keflex as ordered by DR Nevada Crane. PTs daughter stated that the PT does not have fever, chills, cough, SOB, whezing. She called DR hall's office but she has not heard from them.    PT sated that her mother takes premier protein drink, she eats and drinks little by little.  Observations/Objective:   Assessment and Plan: PT 's daughter advised to follow up with DR Nevada Crane, continue dressing changes and continue antibiotics.  Turn pt in the bed to help with wound healing and avoid laying her on her back.   Follow Up Instructions:    I discussed the assessment and treatment plan with the patient. The patient was provided an opportunity to ask questions and all were answered. The patient agreed with the plan and demonstrated an understanding of the instructions.   The patient was advised to call back or seek an in-person evaluation if the symptoms worsen or if the condition  fails to improve as anticipated.

## 2021-09-19 ENCOUNTER — Telehealth: Payer: Medicare Other | Admitting: Family Medicine

## 2021-10-08 ENCOUNTER — Emergency Department (HOSPITAL_COMMUNITY): Payer: Medicare Other

## 2021-10-08 ENCOUNTER — Other Ambulatory Visit: Payer: Self-pay

## 2021-10-08 ENCOUNTER — Inpatient Hospital Stay (HOSPITAL_COMMUNITY)
Admission: EM | Admit: 2021-10-08 | Discharge: 2021-10-10 | DRG: 871 | Disposition: A | Discharge status: Hospice | Payer: Medicare Other | Attending: Internal Medicine | Admitting: Internal Medicine

## 2021-10-08 DIAGNOSIS — E876 Hypokalemia: Secondary | ICD-10-CM

## 2021-10-08 DIAGNOSIS — R64 Cachexia: Secondary | ICD-10-CM | POA: Diagnosis present

## 2021-10-08 DIAGNOSIS — L89626 Pressure-induced deep tissue damage of left heel: Secondary | ICD-10-CM | POA: Diagnosis present

## 2021-10-08 DIAGNOSIS — K219 Gastro-esophageal reflux disease without esophagitis: Secondary | ICD-10-CM | POA: Diagnosis not present

## 2021-10-08 DIAGNOSIS — J189 Pneumonia, unspecified organism: Secondary | ICD-10-CM | POA: Diagnosis present

## 2021-10-08 DIAGNOSIS — J9601 Acute respiratory failure with hypoxia: Secondary | ICD-10-CM

## 2021-10-08 DIAGNOSIS — Z515 Encounter for palliative care: Secondary | ICD-10-CM | POA: Diagnosis not present

## 2021-10-08 DIAGNOSIS — L89126 Pressure-induced deep tissue damage of left upper back: Secondary | ICD-10-CM | POA: Diagnosis present

## 2021-10-08 DIAGNOSIS — A419 Sepsis, unspecified organism: Principal | ICD-10-CM | POA: Diagnosis present

## 2021-10-08 DIAGNOSIS — Z681 Body mass index (BMI) 19 or less, adult: Secondary | ICD-10-CM

## 2021-10-08 DIAGNOSIS — L89154 Pressure ulcer of sacral region, stage 4: Secondary | ICD-10-CM | POA: Diagnosis present

## 2021-10-08 DIAGNOSIS — F0394 Unspecified dementia, unspecified severity, with anxiety: Secondary | ICD-10-CM | POA: Diagnosis present

## 2021-10-08 DIAGNOSIS — Z66 Do not resuscitate: Secondary | ICD-10-CM | POA: Diagnosis present

## 2021-10-08 DIAGNOSIS — Z888 Allergy status to other drugs, medicaments and biological substances status: Secondary | ICD-10-CM | POA: Diagnosis not present

## 2021-10-08 DIAGNOSIS — R0689 Other abnormalities of breathing: Secondary | ICD-10-CM | POA: Diagnosis not present

## 2021-10-08 DIAGNOSIS — Z20822 Contact with and (suspected) exposure to covid-19: Secondary | ICD-10-CM | POA: Diagnosis present

## 2021-10-08 DIAGNOSIS — Z7189 Other specified counseling: Secondary | ICD-10-CM | POA: Diagnosis not present

## 2021-10-08 DIAGNOSIS — L89616 Pressure-induced deep tissue damage of right heel: Secondary | ICD-10-CM | POA: Diagnosis present

## 2021-10-08 DIAGNOSIS — J439 Emphysema, unspecified: Secondary | ICD-10-CM | POA: Diagnosis not present

## 2021-10-08 DIAGNOSIS — S31000A Unspecified open wound of lower back and pelvis without penetration into retroperitoneum, initial encounter: Secondary | ICD-10-CM

## 2021-10-08 DIAGNOSIS — R1310 Dysphagia, unspecified: Secondary | ICD-10-CM | POA: Diagnosis not present

## 2021-10-08 DIAGNOSIS — Z96641 Presence of right artificial hip joint: Secondary | ICD-10-CM | POA: Diagnosis present

## 2021-10-08 DIAGNOSIS — R279 Unspecified lack of coordination: Secondary | ICD-10-CM | POA: Diagnosis not present

## 2021-10-08 DIAGNOSIS — Z9842 Cataract extraction status, left eye: Secondary | ICD-10-CM

## 2021-10-08 DIAGNOSIS — E8809 Other disorders of plasma-protein metabolism, not elsewhere classified: Secondary | ICD-10-CM | POA: Diagnosis present

## 2021-10-08 DIAGNOSIS — L89153 Pressure ulcer of sacral region, stage 3: Secondary | ICD-10-CM

## 2021-10-08 DIAGNOSIS — R000 Tachycardia, unspecified: Secondary | ICD-10-CM | POA: Diagnosis not present

## 2021-10-08 DIAGNOSIS — Z8249 Family history of ischemic heart disease and other diseases of the circulatory system: Secondary | ICD-10-CM

## 2021-10-08 DIAGNOSIS — Z823 Family history of stroke: Secondary | ICD-10-CM

## 2021-10-08 DIAGNOSIS — Z7401 Bed confinement status: Secondary | ICD-10-CM

## 2021-10-08 DIAGNOSIS — D72829 Elevated white blood cell count, unspecified: Secondary | ICD-10-CM

## 2021-10-08 DIAGNOSIS — E46 Unspecified protein-calorie malnutrition: Secondary | ICD-10-CM | POA: Diagnosis not present

## 2021-10-08 DIAGNOSIS — Z818 Family history of other mental and behavioral disorders: Secondary | ICD-10-CM

## 2021-10-08 DIAGNOSIS — E039 Hypothyroidism, unspecified: Secondary | ICD-10-CM

## 2021-10-08 DIAGNOSIS — Z885 Allergy status to narcotic agent status: Secondary | ICD-10-CM

## 2021-10-08 DIAGNOSIS — Z743 Need for continuous supervision: Secondary | ICD-10-CM | POA: Diagnosis not present

## 2021-10-08 DIAGNOSIS — Z880 Allergy status to penicillin: Secondary | ICD-10-CM | POA: Diagnosis not present

## 2021-10-08 DIAGNOSIS — Z79890 Hormone replacement therapy (postmenopausal): Secondary | ICD-10-CM

## 2021-10-08 DIAGNOSIS — R509 Fever, unspecified: Secondary | ICD-10-CM | POA: Diagnosis not present

## 2021-10-08 DIAGNOSIS — R531 Weakness: Secondary | ICD-10-CM | POA: Diagnosis not present

## 2021-10-08 DIAGNOSIS — E860 Dehydration: Secondary | ICD-10-CM | POA: Diagnosis present

## 2021-10-08 DIAGNOSIS — Z720 Tobacco use: Secondary | ICD-10-CM

## 2021-10-08 DIAGNOSIS — M6281 Muscle weakness (generalized): Secondary | ICD-10-CM | POA: Diagnosis not present

## 2021-10-08 DIAGNOSIS — I959 Hypotension, unspecified: Secondary | ICD-10-CM | POA: Diagnosis not present

## 2021-10-08 DIAGNOSIS — E43 Unspecified severe protein-calorie malnutrition: Secondary | ICD-10-CM | POA: Diagnosis present

## 2021-10-08 DIAGNOSIS — L89896 Pressure-induced deep tissue damage of other site: Secondary | ICD-10-CM | POA: Diagnosis present

## 2021-10-08 DIAGNOSIS — Z9841 Cataract extraction status, right eye: Secondary | ICD-10-CM

## 2021-10-08 DIAGNOSIS — Z79899 Other long term (current) drug therapy: Secondary | ICD-10-CM

## 2021-10-08 DIAGNOSIS — R0902 Hypoxemia: Secondary | ICD-10-CM | POA: Diagnosis not present

## 2021-10-08 DIAGNOSIS — Z791 Long term (current) use of non-steroidal anti-inflammatories (NSAID): Secondary | ICD-10-CM

## 2021-10-08 DIAGNOSIS — J9811 Atelectasis: Secondary | ICD-10-CM | POA: Diagnosis present

## 2021-10-08 DIAGNOSIS — F03A0 Unspecified dementia, mild, without behavioral disturbance, psychotic disturbance, mood disturbance, and anxiety: Secondary | ICD-10-CM | POA: Diagnosis not present

## 2021-10-08 DIAGNOSIS — Z833 Family history of diabetes mellitus: Secondary | ICD-10-CM

## 2021-10-08 DIAGNOSIS — I2510 Atherosclerotic heart disease of native coronary artery without angina pectoris: Secondary | ICD-10-CM | POA: Diagnosis not present

## 2021-10-08 DIAGNOSIS — G311 Senile degeneration of brain, not elsewhere classified: Secondary | ICD-10-CM | POA: Diagnosis not present

## 2021-10-08 DIAGNOSIS — D75839 Thrombocytosis, unspecified: Secondary | ICD-10-CM | POA: Diagnosis present

## 2021-10-08 DIAGNOSIS — R0602 Shortness of breath: Secondary | ICD-10-CM | POA: Diagnosis not present

## 2021-10-08 DIAGNOSIS — H25093 Other age-related incipient cataract, bilateral: Secondary | ICD-10-CM | POA: Diagnosis not present

## 2021-10-08 DIAGNOSIS — J90 Pleural effusion, not elsewhere classified: Secondary | ICD-10-CM | POA: Diagnosis not present

## 2021-10-08 DIAGNOSIS — E785 Hyperlipidemia, unspecified: Secondary | ICD-10-CM | POA: Diagnosis present

## 2021-10-08 LAB — CBC WITH DIFFERENTIAL/PLATELET
Abs Immature Granulocytes: 0.13 10*3/uL — ABNORMAL HIGH (ref 0.00–0.07)
Basophils Absolute: 0.1 10*3/uL (ref 0.0–0.1)
Basophils Relative: 0 %
Eosinophils Absolute: 0.1 10*3/uL (ref 0.0–0.5)
Eosinophils Relative: 1 %
HCT: 27.5 % — ABNORMAL LOW (ref 36.0–46.0)
Hemoglobin: 8.4 g/dL — ABNORMAL LOW (ref 12.0–15.0)
Immature Granulocytes: 1 %
Lymphocytes Relative: 6 %
Lymphs Abs: 1.4 10*3/uL (ref 0.7–4.0)
MCH: 32.3 pg (ref 26.0–34.0)
MCHC: 30.5 g/dL (ref 30.0–36.0)
MCV: 105.8 fL — ABNORMAL HIGH (ref 80.0–100.0)
Monocytes Absolute: 1 10*3/uL (ref 0.1–1.0)
Monocytes Relative: 4 %
Neutro Abs: 19.9 10*3/uL — ABNORMAL HIGH (ref 1.7–7.7)
Neutrophils Relative %: 88 %
Platelets: 495 10*3/uL — ABNORMAL HIGH (ref 150–400)
RBC: 2.6 MIL/uL — ABNORMAL LOW (ref 3.87–5.11)
RDW: 20 % — ABNORMAL HIGH (ref 11.5–15.5)
WBC: 22.6 10*3/uL — ABNORMAL HIGH (ref 4.0–10.5)
nRBC: 0.1 % (ref 0.0–0.2)

## 2021-10-08 LAB — COMPREHENSIVE METABOLIC PANEL
ALT: 14 U/L (ref 0–44)
AST: 23 U/L (ref 15–41)
Albumin: 1.9 g/dL — ABNORMAL LOW (ref 3.5–5.0)
Alkaline Phosphatase: 79 U/L (ref 38–126)
Anion gap: 10 (ref 5–15)
BUN: 28 mg/dL — ABNORMAL HIGH (ref 8–23)
CO2: 26 mmol/L (ref 22–32)
Calcium: 8.3 mg/dL — ABNORMAL LOW (ref 8.9–10.3)
Chloride: 106 mmol/L (ref 98–111)
Creatinine, Ser: 0.6 mg/dL (ref 0.44–1.00)
GFR, Estimated: >60 mL/min (ref >60)
Glucose, Bld: 129 mg/dL — ABNORMAL HIGH (ref 70–99)
Potassium: 3.4 mmol/L — ABNORMAL LOW (ref 3.5–5.1)
Sodium: 142 mmol/L (ref 135–145)
Total Bilirubin: 0.7 mg/dL (ref 0.3–1.2)
Total Protein: 6.3 g/dL — ABNORMAL LOW (ref 6.5–8.1)

## 2021-10-08 LAB — RESP PANEL BY RT-PCR (FLU A&B, COVID) ARPGX2
Influenza A by PCR: NEGATIVE
Influenza B by PCR: NEGATIVE
SARS Coronavirus 2 by RT PCR: NEGATIVE

## 2021-10-08 LAB — LACTIC ACID, PLASMA
Lactic Acid, Venous: 1.7 mmol/L (ref 0.5–1.9)
Lactic Acid, Venous: 2.4 mmol/L (ref 0.5–1.9)

## 2021-10-08 LAB — PROTIME-INR
INR: 1.5 — ABNORMAL HIGH (ref 0.8–1.2)
Prothrombin Time: 18.2 seconds — ABNORMAL HIGH (ref 11.4–15.2)

## 2021-10-08 MED ORDER — VANCOMYCIN HCL 750 MG/150ML IV SOLN: 750.0000 mg | INTRAVENOUS | Status: DC

## 2021-10-08 MED ORDER — SODIUM CHLORIDE 0.9 % IV SOLN: Freq: Once | INTRAVENOUS | Status: AC

## 2021-10-08 MED ORDER — LACTATED RINGERS IV BOLUS
1500.0000 mL | Freq: Once | INTRAVENOUS | Status: AC
  Administered 2021-10-08: 18:00:00 1000 mL via INTRAVENOUS

## 2021-10-08 MED ORDER — ACETAMINOPHEN 325 MG PO TABS: 650.0000 mg | ORAL_TABLET | Freq: Four times a day (QID) | ORAL | Status: DC | PRN

## 2021-10-08 MED ORDER — ACETAMINOPHEN 650 MG RE SUPP: 650.0000 mg | Freq: Four times a day (QID) | RECTAL | Status: DC | PRN

## 2021-10-08 MED ORDER — METRONIDAZOLE 500 MG/100ML IV SOLN
500.0000 mg | Freq: Two times a day (BID) | INTRAVENOUS | Status: DC
  Administered 2021-10-09: 09:00:00 500 mg via INTRAVENOUS
  Filled 2021-10-08: qty 100

## 2021-10-08 MED ORDER — SODIUM CHLORIDE 0.9 % IV SOLN
2.0000 g | Freq: Once | INTRAVENOUS | Status: AC
  Administered 2021-10-08: 18:00:00 2 g via INTRAVENOUS
  Filled 2021-10-08: qty 2

## 2021-10-08 MED ORDER — METRONIDAZOLE 500 MG/100ML IV SOLN
500.0000 mg | Freq: Once | INTRAVENOUS | Status: AC
  Administered 2021-10-08: 21:00:00 500 mg via INTRAVENOUS
  Filled 2021-10-08: qty 100

## 2021-10-08 MED ORDER — VANCOMYCIN HCL IN DEXTROSE 1-5 GM/200ML-% IV SOLN
1000.0000 mg | Freq: Once | INTRAVENOUS | Status: AC
  Administered 2021-10-08: 19:00:00 1000 mg via INTRAVENOUS
  Filled 2021-10-08: qty 200

## 2021-10-08 MED ORDER — POTASSIUM CHLORIDE 10 MEQ/100ML IV SOLN
10.0000 meq | INTRAVENOUS | Status: AC
  Administered 2021-10-09 (×2): 10 meq via INTRAVENOUS
  Filled 2021-10-08 (×2): qty 100

## 2021-10-08 MED ORDER — SODIUM CHLORIDE 0.9 % IV SOLN
2.0000 g | Freq: Two times a day (BID) | INTRAVENOUS | Status: DC
  Administered 2021-10-09: 08:00:00 2 g via INTRAVENOUS
  Filled 2021-10-08: qty 2

## 2021-10-08 MED ORDER — ENOXAPARIN SODIUM 30 MG/0.3ML IJ SOSY
30.0000 mg | PREFILLED_SYRINGE | INTRAMUSCULAR | Status: DC
  Administered 2021-10-09: 06:00:00 30 mg via SUBCUTANEOUS
  Filled 2021-10-08: qty 0.3
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
## 2021-10-08 NOTE — ED Notes (Signed)
Put pt on pure wick
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
## 2021-10-08 NOTE — ED Triage Notes (Signed)
Fever up to 100.4 x 1 day, ams x 2 days, sats 85% on room air, tachypneic 35. Pt is cared for at home by her daughters. Pt has wound to buttocks x 2 months and is on abx for same.

## 2021-10-09 ENCOUNTER — Encounter (HOSPITAL_COMMUNITY): Payer: Self-pay | Admitting: Internal Medicine

## 2021-10-09 DIAGNOSIS — Z515 Encounter for palliative care: Secondary | ICD-10-CM

## 2021-10-09 DIAGNOSIS — Z7189 Other specified counseling: Secondary | ICD-10-CM

## 2021-10-09 LAB — COMPREHENSIVE METABOLIC PANEL
ALT: 12 U/L (ref 0–44)
AST: 22 U/L (ref 15–41)
Albumin: 1.6 g/dL — ABNORMAL LOW (ref 3.5–5.0)
Alkaline Phosphatase: 68 U/L (ref 38–126)
Anion gap: 10 (ref 5–15)
BUN: 24 mg/dL — ABNORMAL HIGH (ref 8–23)
CO2: 23 mmol/L (ref 22–32)
Calcium: 8 mg/dL — ABNORMAL LOW (ref 8.9–10.3)
Chloride: 107 mmol/L (ref 98–111)
Creatinine, Ser: 0.42 mg/dL — ABNORMAL LOW (ref 0.44–1.00)
GFR, Estimated: >60 mL/min (ref >60)
Glucose, Bld: 119 mg/dL — ABNORMAL HIGH (ref 70–99)
Potassium: 3.7 mmol/L (ref 3.5–5.1)
Sodium: 140 mmol/L (ref 135–145)
Total Bilirubin: 0.6 mg/dL (ref 0.3–1.2)
Total Protein: 5.5 g/dL — ABNORMAL LOW (ref 6.5–8.1)

## 2021-10-09 LAB — CBC
HCT: 25.6 % — ABNORMAL LOW (ref 36.0–46.0)
Hemoglobin: 7.8 g/dL — ABNORMAL LOW (ref 12.0–15.0)
MCH: 31.7 pg (ref 26.0–34.0)
MCHC: 30.5 g/dL (ref 30.0–36.0)
MCV: 104.1 fL — ABNORMAL HIGH (ref 80.0–100.0)
Platelets: 453 10*3/uL — ABNORMAL HIGH (ref 150–400)
RBC: 2.46 MIL/uL — ABNORMAL LOW (ref 3.87–5.11)
RDW: 19.8 % — ABNORMAL HIGH (ref 11.5–15.5)
WBC: 23 10*3/uL — ABNORMAL HIGH (ref 4.0–10.5)
nRBC: 0.1 % (ref 0.0–0.2)

## 2021-10-09 LAB — APTT: aPTT: 32 seconds (ref 24–36)

## 2021-10-09 LAB — MAGNESIUM: Magnesium: 1.9 mg/dL (ref 1.7–2.4)

## 2021-10-09 LAB — PROCALCITONIN: Procalcitonin: 0.82 ng/mL

## 2021-10-09 LAB — PHOSPHORUS: Phosphorus: 1.9 mg/dL — ABNORMAL LOW (ref 2.5–4.6)

## 2021-10-09 MED ORDER — GLYCOPYRROLATE 0.2 MG/ML IJ SOLN: 0.2000 mg | INTRAMUSCULAR | Status: DC | PRN

## 2021-10-09 MED ORDER — LORAZEPAM 1 MG PO TABS: 1.0000 mg | ORAL_TABLET | ORAL | Status: DC | PRN

## 2021-10-09 MED ORDER — POTASSIUM PHOSPHATES 15 MMOLE/5ML IV SOLN
30.0000 mmol | Freq: Once | INTRAVENOUS | Status: DC
  Filled 2021-10-09: qty 10

## 2021-10-09 MED ORDER — CLONAZEPAM 0.5 MG PO TABS: 0.5000 mg | ORAL_TABLET | Freq: Every day | ORAL | Status: DC | PRN

## 2021-10-09 MED ORDER — HALOPERIDOL LACTATE 5 MG/ML IJ SOLN: 0.5000 mg | INTRAMUSCULAR | Status: DC | PRN

## 2021-10-09 MED ORDER — MORPHINE SULFATE (CONCENTRATE) 10 MG/0.5ML PO SOLN
5.0000 mg | ORAL | Status: DC | PRN
  Administered 2021-10-09: 18:00:00 5 mg via SUBLINGUAL
  Filled 2021-10-09: qty 0.5

## 2021-10-09 MED ORDER — MORPHINE SULFATE (CONCENTRATE) 10 MG/0.5ML PO SOLN: 5.0000 mg | ORAL | Status: DC | PRN

## 2021-10-09 MED ORDER — ONDANSETRON HCL 4 MG/2ML IJ SOLN: 4.0000 mg | Freq: Four times a day (QID) | INTRAMUSCULAR | Status: DC | PRN

## 2021-10-09 MED ORDER — LORAZEPAM 2 MG/ML PO CONC: 1.0000 mg | ORAL | Status: DC | PRN

## 2021-10-09 MED ORDER — BIOTENE DRY MOUTH MT LIQD: 15.0000 mL | OROMUCOSAL | Status: DC | PRN

## 2021-10-09 MED ORDER — LORAZEPAM 2 MG/ML IJ SOLN: 1.0000 mg | INTRAMUSCULAR | Status: DC | PRN

## 2021-10-09 MED ORDER — GABAPENTIN 100 MG PO CAPS: 200.0000 mg | ORAL_CAPSULE | Freq: Two times a day (BID) | ORAL | Status: DC

## 2021-10-09 MED ORDER — HALOPERIDOL LACTATE 2 MG/ML PO CONC
0.5000 mg | ORAL | Status: DC | PRN
  Filled 2021-10-09: qty 0.3

## 2021-10-09 MED ORDER — GLYCOPYRROLATE 1 MG PO TABS: 1.0000 mg | ORAL_TABLET | ORAL | Status: DC | PRN

## 2021-10-09 MED ORDER — POLYVINYL ALCOHOL 1.4 % OP SOLN: 1.0000 [drp] | Freq: Four times a day (QID) | OPHTHALMIC | Status: DC | PRN

## 2021-10-09 MED ORDER — HALOPERIDOL 0.5 MG PO TABS: 0.5000 mg | ORAL_TABLET | ORAL | Status: DC | PRN

## 2021-10-09 MED ORDER — ONDANSETRON 4 MG PO TBDP: 4.0000 mg | ORAL_TABLET | Freq: Four times a day (QID) | ORAL | Status: DC | PRN

## 2021-10-09 MED ORDER — VANCOMYCIN HCL 500 MG/100ML IV SOLN
500.0000 mg | INTRAVENOUS | Status: DC
  Filled 2021-10-09: qty 100

## 2021-10-09 NOTE — Progress Notes (Deleted)
Patient arrived from ED. Sepsis from wound is one of her probable diagnosis. Patient without noted distress aside from vital signs. Patients daughter currently at bedside. Will continue to monitor.

## 2021-10-09 NOTE — Plan of Care (Signed)
Received patient from ED awake, alert but confused. Patient settled into room, assessment completed. Daughter at bedside. Will continue to monitor.   Problem: Education: Goal: Knowledge of General Education information will improve Description: Including pain rating scale, medication(s)/side effects and non-pharmacologic comfort measures Outcome: Progressing   Problem: Health Behavior/Discharge Planning: Goal: Ability to manage health-related needs will improve Outcome: Progressing   Problem: Clinical Measurements: Goal: Ability to maintain clinical measurements within normal limits will improve Outcome: Progressing Goal: Will remain free from infection Outcome: Progressing Goal: Diagnostic test results will improve Outcome: Progressing Goal: Respiratory complications will improve Outcome: Progressing Goal: Cardiovascular complication will be avoided Outcome: Progressing   Problem: Activity: Goal: Risk for activity intolerance will decrease Outcome: Progressing   Problem: Nutrition: Goal: Adequate nutrition will be maintained Outcome: Progressing   Problem: Coping: Goal: Level of anxiety will decrease Outcome: Progressing   Problem: Elimination: Goal: Will not experience complications related to bowel motility Outcome: Progressing Goal: Will not experience complications related to urinary retention Outcome: Progressing   Problem: Pain Managment: Goal: General experience of comfort will improve Outcome: Progressing   Problem: Safety: Goal: Ability to remain free from injury will improve Outcome: Progressing   Problem: Skin Integrity: Goal: Risk for impaired skin integrity will decrease Outcome: Progressing

## 2021-10-09 NOTE — Consult Note (Signed)
WOC Nurse Consult Note: Reason for Consult: Sacral and bilateral heel pressure injuries Wound type: Pressure vs skin changes at life's end (SCALE) Pressure Injury POA: Yes Measurement: Not taken today. Dressing procedure/placement/frequency: Secure chat conversations with Dr. Eliseo Squires and Bedside LPN L. Chana Bode to develop a POC.  Patient is currently inpatient for coordination of care to transition to Hospice at home. In the event this is not accomplished, an in-hospital death is anticipated. Photos of wounds are in the EHR except for the sacral wound. During the time of Dr. Gaynelle Adu visit today, the patient was incontinent of stool and the photo could not be obtained. Guidance for a mattress replacement with low air loss feature, bilateral Prevalon boots and turning and repositioning per our house protocol are placed. Heels will have xeroform gauze placed over lesions and dry dressings secured with Kerlix roll gauze/paper tape. The sacral pressure injury will be dressed with saline moistened gauze dressings and changed twice daily and PRN soiling, drainage strike-through or dressing dislodgement.  [facility] nursing team will not follow, but will remain available to this patient, the nursing and medical teams.  Please re-consult if needed. Thanks, Maudie Flakes, MSN, RN, [place], [facility]  Pager# [phone]

## 2021-10-09 NOTE — Progress Notes (Signed)
Patient arrived from ED. Sepsis from wound is one of her probable diagnosis. Patient without noted distress aside from vital signs. Patients daughter currently at bedside. Will continue to monitor.         Note Details  Author Valetta Fuller, RN File Time 10/09/2021  4:41 AM  Author Type Registered Nurse Status Signed  Last Editor Valetta Fuller, [place] # [phone] Admit Date 10/08/2021    10/08/21 2319  Assess: MEWS Score  Temp 98 F (36.7 C)  BP (!) 154/74  Pulse Rate (!) 125  Resp 19  SpO2 95 %  O2 Device Nasal Cannula  Assess: MEWS Score  MEWS Temp 0  MEWS Systolic 0  MEWS Pulse 2  MEWS RR 0  MEWS LOC 0  MEWS Score 2  MEWS Score Color Yellow  Assess: if the MEWS score is Yellow or Red  Were vital signs taken at a resting state? Yes  Focused Assessment Change from prior assessment (see assessment flowsheet)  Early Detection of Sepsis Score *See Row Information* Medium (Patient with probable dx of Sepsis per ED notes)  MEWS guidelines implemented *See Row Information* Yes  Treat  MEWS Interventions Administered scheduled meds/treatments  Pain Scale Faces  Pain Score 1  Escalate  MEWS: Escalate Yellow: discuss with charge nurse/RN and consider discussing with provider and RRT  Notify: Charge Nurse/RN  Name of Charge Nurse/RN Notified Sonia Baller, RN  Date Charge Nurse/RN Notified 10/08/21  Time Charge Nurse/RN Notified 2330  Notify: Provider  Provider Name/Title Providers aware

## 2021-10-09 NOTE — TOC Initial Note (Signed)
Transition of Care [facility]) - Initial/Assessment Note    Patient Details  Name: Kara [name] MRN: [id number] Date of Birth: March 26, 1927  Transition of Care [facility]) CM/SW Contact:    Salome Arnt, [place] Phone Number: 10/09/2021, 3:13 PM  Clinical Narrative:   Pt admitted due to acute respiratory failure with hypoxia. TOC received consult for home hospice. LCSW met with pt's daughters, [name] and [name]. They shared about discussion with palliative earlier today and request referral to [facility]. Pt has hospital bed and will need home O2. Referral made to Cassandra at [facility]. She has spoken with family and hospital bed will need to be switched out as one currently in home is not fully electric. Pt's daughters will be rotating care in the home. Anticipate d/c tomorrow.                 Expected Discharge Plan: Home w Hospice Care Barriers to Discharge: Other (must enter comment) (DME delivery)   Patient Goals and CMS Choice Patient states their goals for this hospitalization and ongoing recovery are:: return home with hospice   Choice offered to / list presented to : Adult Children  Expected Discharge Plan and Services Expected Discharge Plan: Home w Hospice Care In-house Referral: Clinical Social Work, Hospice / [place] Acute Care Choice: Durable Medical Equipment, Hospice Living arrangements for the past 2 months: Single Family Home                 DME Arranged: Hospital bed, Oxygen DME Agency: [place] Agency: [place] Date [place]: 10/09/21 Time [place]: 46 Representative spoke with at [place]: [place] Arrangements/Services Living arrangements for the past 2 months: [place] Lives with::  (daughters caring for pt around the clock) Patient language and need for interpreter reviewed:: Yes        Need for Family Participation in Patient Care: Yes  (Comment) Care giver support system in place?: Yes (comment) Current home services: DME Criminal Activity/Legal Involvement Pertinent to Current Situation/Hospitalization: No - Comment as needed  Activities of Daily Living Home Assistive Devices/Equipment: None ADL Screening (condition at time of admission) Patient's cognitive ability adequate to safely complete daily activities?: No Is the patient deaf or have difficulty hearing?: No Does the patient have difficulty seeing, even when wearing glasses/contacts?: No Does the patient have difficulty concentrating, remembering, or making decisions?: Yes Patient able to express need for assistance with ADLs?: No Does the patient have difficulty dressing or bathing?: Yes Independently performs ADLs?: No Communication: Dependent Dressing (OT): Dependent Grooming: Dependent Feeding: Dependent Bathing: Dependent Toileting: Dependent In/Out Bed: Dependent Walks in Home: Dependent Does the patient have difficulty walking or climbing stairs?: Yes Weakness of Legs: Both Weakness of Arms/Hands: Both  Permission Sought/Granted Permission sought to share information with : Other (comment) Permission granted to share information with : Yes, Verbal Permission Granted     Permission granted to share info w AGENCY: [facility]        Emotional Assessment   Attitude/Demeanor/Rapport: Unable to Assess Affect (typically observed): Unable to Assess   Alcohol / Substance Use: Not Applicable Psych Involvement: No (comment)  Admission diagnosis:  SOB (shortness of breath) [R06.02] Acute respiratory failure with hypoxia ([place]) [J96.01] Community acquired pneumonia of left lower lobe of lung [J18.9] Pressure injury of sacral region, stage 3 ([place]) [L89.153] Sepsis, due to unspecified  organism, unspecified whether acute organ dysfunction present [facility]) [A41.9] Patient Active Problem List   Diagnosis Date Noted   Acute respiratory  failure with hypoxia ([place]) 10/08/2021   Leukocytosis 10/08/2021   Thrombocytosis 10/08/2021   Hypokalemia 10/08/2021   Sacral wound 09/18/2021   Hospital discharge follow-up 07/16/2021   Dehydration    Protein-calorie malnutrition, severe 07/05/2021   Pressure injury of skin 07/05/2021   Sepsis secondary to CAP 07/04/2021   Dementia without behavioral disturbance ([place]) 07/04/2021   Hypothyroidism 07/04/2021   CAP (community acquired pneumonia) 07/04/2021   Heel ulcer ([place]) 05/24/2021   Irritation symptom of skin 05/24/2021   Requires daily assistance for activities of daily living (ADL) and comfort needs 05/24/2021   Back pain 09/26/2020   Incontinence 08/27/2020   Need for home health care 08/27/2020   Generalized osteoarthritis 07/31/2020   Chronic hip pain, right 01/13/2020   Hypotonia, speech impairment, and severe cognitive delay syndrome 01/12/2020   Dry eyes 11/07/2019   Urinary frequency 05/07/2019   Abnormal thyroid function test 05/07/2019   Underweight due to inadequate caloric intake 05/07/2019   Anxiety 05/07/2019   MCI (mild cognitive impairment) 03/20/2019   Trochanteric bursitis, right hip 08/02/2017   Closed compression fracture of L5 lumbar vertebra 05/30/2017   Sciatica of right side 04/25/2017   CAD (coronary atherosclerotic disease) 03/26/2017   Schatzki's ring    Hiatal hernia    Acute gastroenteritis 03/28/2014   Unsteady gait 01/15/2014   Cystocele, midline 09/22/2013   IGT (impaired glucose tolerance) 07/26/2013   IBS (irritable bowel syndrome) 07/01/2012   Arthritis 08/26/2011   Diverticulosis 03/29/2011   GERD (gastroesophageal reflux disease) 03/29/2011   Seasonal allergic rhinitis 01/25/2010   Uterine prolapse 08/11/2008   Hyperlipidemia LDL goal <100 01/15/2008   Age-related osteoporosis with current pathological fracture 01/15/2008   PCP:  Fayrene Helper, MD Pharmacy:   [place], [place] - [address] [place] [address] Phone: [phone] Fax: [phone]     Social Determinants of Health (SDOH) Interventions    Readmission Risk Interventions No flowsheet data found.

## 2021-10-09 NOTE — TOC Progression Note (Signed)
Transition of Care [facility] - Progression Note    Patient Details  Name: Kara [name] MRN: [id number] Date of Birth: Sep 18, 1927  Transition of Care [facility]) CM/SW Contact  Salome Arnt, [place] Phone Number: 10/09/2021, 10:29 AM  Clinical Narrative:   LCSW reviewed chart. Noted poor prognosis and palliative consult pending. TOC will continue to follow recommendations.       Barriers to Discharge: Continued Medical Work up  Expected Discharge Plan and Services                                                 Social Determinants of Health (SDOH) Interventions    Readmission Risk Interventions No flowsheet data found.

## 2021-10-09 NOTE — Consult Note (Addendum)
Consultation Note Date: 10/09/2021   Patient Name: RACHELLA BASDEN  DOB: [birth year]  MRN: [id number]  Age / Sex: 87 y.o., female  PCP: Fayrene Helper, MD Referring Physician: Geradine Girt, DO  Reason for Consultation:  [place], needs hospice  HPI/Patient Profile: 87 y.o. female  with past medical history of, debilitating osteoarthritis, large hiatal hernia, anxiety, sacral and heel ulcers, dementia   admitted on 10/08/2021 with acute respiratory failure, sepsis presumed due to pneumonia. She is severely cachectic and hypoalbuminemic at 1.6. Palliative consulted for [place].   Primary Decision Maker NEXT OF KIN- daughters  Discussion: I have reviewed medical records including EPIC notes, labs and imaging, received report from RN, assessed the patient and then had conversation with patient's daughters via phone call to discuss diagnosis prognosis, GOC, EOL wishes, disposition and options.  I introduced Palliative Medicine as specialized medical care for people living with serious illness. It focuses on providing relief from the symptoms and stress of a serious illness. The goal is to improve quality of life for both the patient and the family.  We discussed a brief life review of the patient and then focused on their current illness. The natural disease trajectory and expectations at EOL were discussed.  Athina started working young age 44 in the [place].  She was married at 9 she has been widowed for 33 years.  She has always valued her independence she mostly lives alone.  As far as her functional and nutritional status her daughters share that Roe is bedbound, she is contracted, they provide all of her ADLs.  Her oral intake has been minimal.  They describe what sounds like dysphagia.  I attempted to elicit values and goals of care important to the patient.  [name] and [name] share that [place] cared for her  mother for several years and watched her mother suffer.  Jany has verbalized to them that she does not want to suffer as her mother did.  She would not want a feeding tube.  In fact she has shared with her daughters several times this year that she is ready to die.  She is tired and weak.  The difference between aggressive medical intervention and comfort care was considered in light of the patient's goals of care.   Advanced directives, concepts specific to code status, artifical feeding and hydration, and rehospitalization were considered and discussed.  Itzamar's family is clear that they do not wish to do any interventions that are going to prolong her life.  This includes IV fluids and IV antibiotics.  Jamicia has previously expressed her wish to die at home comfortably.  Hospice Care services outpatient were explained and offered.  Family would like to receive services from [facility] of [facility].  Questions and concerns were addressed.  Hard Choices booklet left for review. The family was encouraged to call with questions or concerns.  PMT will continue to support holistically.  SUMMARY OF RECOMMENDATIONS   -Transition to full comfort care only -DC labs IV fluids and antibiotics  Transition to full comfort measures 5mg  morphine concentrate SL q1hr prn SOB, air hunger Lorazepam 1mg  po, SL or IV q4 hr anxiety Haldol .5mg  po, SL or IV q4 hr PRN agitation Glycopyrrolate .2mg  IV q4hr secretions On discharge, would recommend scripts for: - Morphine Concentrate 10mg /0.36ml: 5mg  (0.73ml) sublingual every 1 hour as needed for pain or shortness of breath: Disp 26ml - Lorazepam 2mg /ml concentrated solution: 1mg  (0.64ml) sublingual every 4 hours as needed for anxiety: Disp 84ml - Haldol 2mg /ml solution: 0.5mg  (0.17ml) sublingual every 4 hours as needed for agitation or nausea: Disp 50ml  TOC referral for hospice services at home   Code Status/Advance Care Planning: DNR   Prognosis:   < 2  weeks  Discharge Planning: Home with Hospice  Primary Diagnoses: Present on Admission:  Acute respiratory failure with hypoxia ([place])  CAP (community acquired pneumonia)  Sacral wound  Hypothyroidism  GERD (gastroesophageal reflux disease)   Review of Systems  Physical Exam  Vital Signs: BP (!) 106/28 (BP Location: Right Leg)    Pulse (!) 107    Temp 97.8 F (36.6 C) (Oral)    Resp 19    Ht 5' (1.524 m)    Wt 45 kg    SpO2 96%    BMI 19.38 kg/m  Pain Scale: 0-10   Pain Score: 0-No pain   SpO2: SpO2: 96 % O2 Device:SpO2: 96 % O2 Flow Rate: .O2 Flow Rate (L/min): 6 L/min  IO: Intake/output summary:  Intake/Output Summary (Last 24 hours) at 10/09/2021 1148 Last data filed at 10/09/2021 1000 Gross per 24 hour  Intake 461.41 ml  Output --  Net 461.41 ml    LBM:   Baseline Weight: Weight: 45 kg Most recent weight: Weight: 45 kg     Palliative Assessment/Data:       Thank you for this consult. Palliative medicine will continue to follow and assist as needed.   Time In: 1030 Time Out: 1146 Time Total: 76 minutes Greater than 50%  of this time was spent counseling and coordinating care related to the above assessment and plan.  Signed by: Mariana Kaufman, AGNP-C Palliative Medicine    Please contact Palliative Medicine Team phone at [phone] for questions and concerns.  For individual provider: See Shea Evans

## 2021-10-09 NOTE — Plan of Care (Signed)

## 2021-10-09 NOTE — Progress Notes (Signed)
Progress Note    Kara [name]  BUL:[id number] DOB: [birth year]  DOA: 10/08/2021 PCP: Fayrene Helper, MD    Brief Narrative:     Medical records reviewed and are as summarized below:  Kara [name] is an 87 y.o. female with medical history significant for  cognitive and memory deficits, requiring total care with primary caregiver who is her daughter that lives with her at home, history of hypothyroidism, osteopenia with current compression fractures, GERD,  history of CAD and anxiety disorder who presents to the emergency department due to 1 day onset of fever and 2-day onset of altered mental status.      Assessment/Plan:   Principal Problem:   Acute respiratory failure with hypoxia (HCC) Active Problems:   GERD (gastroesophageal reflux disease)   Hypothyroidism   CAP (community acquired pneumonia)   Sacral wound   Leukocytosis   Thrombocytosis   Hypokalemia   Acute respiratory failure with hypoxia possibly secondary to sepsis due to CAP POA vs Sacral decubitus ulcer Patient met sepsis criteria with tachypnea, tachycardia, leukocytosis and chest x-ray suggestive of pneumonia as well as sacral wound Patient was started on IV vancomycin, cefepime and Flagyl -poor overall prognosis- needs to transition to comfort care- poor nutritional status and dysphagia with wounds that will not heal -prevlon boots  Dysphagia -SLP-- suspect needs best if any recommendations for comfort feeds   Thrombocytosis possibly reactive Platelets 495, continue to monitor platelets with morning labs  Hypokalemia/hypophosphatemia -replete   Dehydration Continue IV hydration- although I suspect she will begin to 3rd space   Hypoalbuminemia/severe protein calorie malnutrition -appears patient is dying  Hypothyroidism -resume synthroid when and if safe top swallow -ultimately needs to be comfort care  GERD Continue Protonix  Pressure ulcer on hips/heels/sacrum Pressure Injury  07/04/21 Sacrum Stage 4 - Full thickness tissue loss with exposed bone, tendon or muscle. (Active)  07/04/21 2045  Location: Sacrum  Location Orientation:   Staging: Stage 4 - Full thickness tissue loss with exposed bone, tendon or muscle.  Wound Description (Comments):   Present on Admission: Yes     Pressure Injury 10/08/21 Thigh Anterior;Proximal;Right Deep Tissue Pressure Injury - Purple or maroon localized area of discolored intact skin or blood-filled blister due to damage of underlying soft tissue from pressure and/or shear. (Active)  10/08/21 2315  Location: Thigh  Location Orientation: Anterior;Proximal;Right  Staging: Deep Tissue Pressure Injury - Purple or maroon localized area of discolored intact skin or blood-filled blister due to damage of underlying soft tissue from pressure and/or shear.  Wound Description (Comments):   Present on Admission: Yes     Pressure Injury 10/08/21 Heel Right Deep Tissue Pressure Injury - Purple or maroon localized area of discolored intact skin or blood-filled blister due to damage of underlying soft tissue from pressure and/or shear. (Active)  10/08/21 2315  Location: Heel  Location Orientation: Right  Staging: Deep Tissue Pressure Injury - Purple or maroon localized area of discolored intact skin or blood-filled blister due to damage of underlying soft tissue from pressure and/or shear.  Wound Description (Comments):   Present on Admission: Yes     Pressure Injury 10/08/21 Heel Left Deep Tissue Pressure Injury - Purple or maroon localized area of discolored intact skin or blood-filled blister due to damage of underlying soft tissue from pressure and/or shear. (Active)  10/08/21 2315  Location: Heel  Location Orientation: Left  Staging: Deep Tissue Pressure Injury - Purple or maroon localized area of discolored intact  skin or blood-filled blister due to damage of underlying soft tissue from pressure and/or shear.  Wound Description (Comments):    Present on Admission: Yes     Pressure Injury 10/08/21 Back Left;Upper Deep Tissue Pressure Injury - Purple or maroon localized area of discolored intact skin or blood-filled blister due to damage of underlying soft tissue from pressure and/or shear. (Active)  10/08/21 2315  Location: Back  Location Orientation: Left;Upper  Staging: Deep Tissue Pressure Injury - Purple or maroon localized area of discolored intact skin or blood-filled blister due to damage of underlying soft tissue from pressure and/or shear.  Wound Description (Comments):   Present on Admission: Yes     Pressure Injury 10/08/21 Thigh Anterior;Left;Medial Deep Tissue Pressure Injury - Purple or maroon localized area of discolored intact skin or blood-filled blister due to damage of underlying soft tissue from pressure and/or shear. (Active)  10/08/21 2315  Location: Thigh  Location Orientation: Anterior;Left;Medial  Staging: Deep Tissue Pressure Injury - Purple or maroon localized area of discolored intact skin or blood-filled blister due to damage of underlying soft tissue from pressure and/or shear.  Wound Description (Comments):   Present on Admission: Yes    I have recommended that patient transition home with hospice otherwise would suspect an in-hospital death   Family Communication/Anticipated D/C date and plan/Code Status   DVT prophylaxis: Lovenox ordered. Code Status: DNR Family Communication: spoke with [name] at length-- recommendations to transition to hospice Disposition Plan: Status is: Inpatient  Remains inpatient appropriate because: needs palliative care consult and transition to hospice         Medical Consultants:   Palliative care  Subjective:   Non-verbal  Objective:    Vitals:   10/08/21 2332 10/09/21 0116 10/09/21 0317 10/09/21 0516  BP:  (!) 118/47 (!) 111/21 (!) 106/28  Pulse:  (!) 121 (!) 115 (!) 107  Resp:  19    Temp:  98.5 F (36.9 C) 98.1 F (36.7 C) 97.8 F  (36.6 C)  TempSrc:  Oral Oral Oral  SpO2: 95% 93% 94% 96%  Weight:      Height:        Intake/Output Summary (Last 24 hours) at 10/09/2021 0847 Last data filed at 10/09/2021 0400 Gross per 24 hour  Intake 361.41 ml  Output --  Net 361.41 ml   Filed Weights   10/08/21 1817  Weight: 45 kg    Exam:  General: Appearance:    Cachectic female who appears uncomfortable     Lungs:     Poor effort, increased work of breathing  Heart:    Tachycardic.   MS:   DTI to b/l heels R>L   Neurologic:   Non-verbal, does not follow commands     Data Reviewed:   I have personally reviewed following labs and imaging studies:  Labs: Labs show the following:   Basic Metabolic Panel: Recent Labs  Lab 10/08/21 1800 10/09/21 0539  NA 142 140  K 3.4* 3.7  CL 106 107  CO2 26 23  GLUCOSE 129* 119*  BUN 28* 24*  CREATININE 0.60 0.42*  CALCIUM 8.3* 8.0*  MG  --  1.9  PHOS  --  1.9*   GFR Estimated Creatinine Clearance: 31.9 mL/min (A) (by C-G formula based on SCr of 0.42 mg/dL (L)). Liver Function Tests: Recent Labs  Lab 10/08/21 1800 10/09/21 0539  AST 23 22  ALT 14 12  ALKPHOS 79 68  BILITOT 0.7 0.6  PROT 6.3* 5.5*  ALBUMIN 1.9*  1.6*   No results for input(s): LIPASE, AMYLASE in the last 168 hours. No results for input(s): AMMONIA in the last 168 hours. Coagulation profile Recent Labs  Lab 10/08/21 1800  INR 1.5*    CBC: Recent Labs  Lab 10/08/21 1800 10/09/21 0539  WBC 22.6* 23.0*  NEUTROABS 19.9*  --   HGB 8.4* 7.8*  HCT 27.5* 25.6*  MCV 105.8* 104.1*  PLT 495* 453*   Cardiac Enzymes: No results for input(s): CKTOTAL, CKMB, CKMBINDEX, TROPONINI in the last 168 hours. BNP (last 3 results) No results for input(s): PROBNP in the last 8760 hours. CBG: No results for input(s): GLUCAP in the last 168 hours. D-Dimer: No results for input(s): DDIMER in the last 72 hours. Hgb A1c: No results for input(s): HGBA1C in the last 72 hours. Lipid Profile: No  results for input(s): CHOL, HDL, LDLCALC, TRIG, CHOLHDL, LDLDIRECT in the last 72 hours. Thyroid function studies: No results for input(s): TSH, T4TOTAL, T3FREE, THYROIDAB in the last 72 hours.  Invalid input(s): FREET3 Anemia work up: No results for input(s): VITAMINB12, FOLATE, FERRITIN, TIBC, IRON, RETICCTPCT in the last 72 hours. Sepsis Labs: Recent Labs  Lab 10/08/21 1800 10/08/21 2033 10/09/21 0539  PROCALCITON  --   --  0.82  WBC 22.6*  --  23.0*  LATICACIDVEN 1.7 2.4*  --     Microbiology Recent Results (from the past 240 hour(s))  Culture, blood (Routine x 2)     Status: None (Preliminary result)   Collection Time: 10/08/21  6:00 PM   Specimen: BLOOD LEFT FOREARM  Result Value Ref Range Status   Specimen Description   Final    BLOOD LEFT FOREARM BOTTLES DRAWN AEROBIC AND ANAEROBIC   Special Requests Blood Culture adequate volume  Final   Culture   Final    NO GROWTH < 12 HOURS Performed at [facility], [address], [place], [place] [address]    Report Status PENDING  Incomplete  Culture, blood (Routine x 2)     Status: None (Preliminary result)   Collection Time: 10/08/21  6:00 PM   Specimen: BLOOD RIGHT FOREARM  Result Value Ref Range Status   Specimen Description   Final    BLOOD RIGHT FOREARM BOTTLES DRAWN AEROBIC AND ANAEROBIC   Special Requests Blood Culture adequate volume  Final   Culture   Final    NO GROWTH < 12 HOURS Performed at [facility], [address] [place], [place] [address]    Report Status PENDING  Incomplete  Resp Panel by RT-PCR (Flu A&B, Covid) Nasopharyngeal Swab     Status: None   Collection Time: 10/08/21  6:00 PM   Specimen: Nasopharyngeal Swab; Nasopharyngeal(NP) swabs in vial transport medium  Result Value Ref Range Status   SARS Coronavirus 2 by RT PCR NEGATIVE NEGATIVE Final    Comment: (NOTE) SARS-CoV-2 target nucleic acids are NOT DETECTED.  The SARS-CoV-2 RNA is generally detectable in upper respiratory specimens  during the acute phase of infection. The lowest concentration of SARS-CoV-2 viral copies this assay can detect is 138 copies/mL. A negative result does not preclude SARS-Cov-2 infection and should not be used as the sole basis for treatment or other patient management decisions. A negative result may occur with  improper specimen collection/handling, submission of specimen other than nasopharyngeal swab, presence of viral mutation(s) within the areas targeted by this assay, and inadequate number of viral copies(<138 copies/mL). A negative result must be combined with clinical observations, patient history, and epidemiological information. The  expected result is Negative.  Fact Sheet for Patients:  EntrepreneurPulse.com.au  Fact Sheet for Healthcare Providers:  IncredibleEmployment.be  This test is no t yet approved or cleared by the [place] FDA and  has been authorized for detection and/or diagnosis of SARS-CoV-2 by FDA under an Emergency Use Authorization (EUA). This EUA will remain  in effect (meaning this test can be used) for the duration of the COVID-19 declaration under Section 564(b)(1) of the Act, 21 U.S.C.section 360bbb-3(b)(1), unless the authorization is terminated  or revoked sooner.       Influenza A by PCR NEGATIVE NEGATIVE Final   Influenza B by PCR NEGATIVE NEGATIVE Final    Comment: (NOTE) The Xpert Xpress SARS-CoV-2/FLU/RSV plus assay is intended as an aid in the diagnosis of influenza from Nasopharyngeal swab specimens and should not be used as a sole basis for treatment. Nasal washings and aspirates are unacceptable for Xpert Xpress SARS-CoV-2/FLU/RSV testing.  Fact Sheet for Patients: EntrepreneurPulse.com.au  Fact Sheet for Healthcare Providers: IncredibleEmployment.be  This test is not yet approved or cleared by the [place] FDA and has been authorized for detection  and/or diagnosis of SARS-CoV-2 by FDA under an Emergency Use Authorization (EUA). This EUA will remain in effect (meaning this test can be used) for the duration of the COVID-19 declaration under Section 564(b)(1) of the Act, 21 U.S.C. section 360bbb-3(b)(1), unless the authorization is terminated or revoked.  Performed at [facility], [address] [place], [place] [address]     Procedures and diagnostic studies:  DG Chest [facility] 1 View  Result Date: 10/08/2021 CLINICAL DATA:  Fever. EXAM: PORTABLE CHEST 1 VIEW COMPARISON:  Chest x-ray 07/04/2021 FINDINGS: There are retrocardiac opacities and small left pleural effusion. Emphysematous changes are again noted in both lungs. There is no pneumothorax. There are atherosclerotic calcifications of the aorta. The cardiac silhouette is within normal limits. No acute fractures are seen. IMPRESSION: 1. Retrocardiac atelectasis/airspace disease worrisome for pneumonia or aspiration. 2. Small left pleural effusion. 3. Followup PA and lateral chest X-ray is recommended in 3-4 weeks following trial of antibiotic therapy to ensure resolution and exclude underlying malignancy. Electronically Signed   By: Ronney Asters M.D.   On: 10/08/2021 18:21    Medications:    enoxaparin (LOVENOX) injection  30 mg Subcutaneous Q24H   gabapentin  200 mg Oral BID   Continuous Infusions:  ceFEPime (MAXIPIME) IV 2 g (10/09/21 0757)   metronidazole 500 mg (10/09/21 0837)   potassium PHOSPHATE IVPB (in mmol)     vancomycin       LOS: 1 day   Geradine Girt  [facility]   How to contact the [facility] Attending or Consulting provider [place] or covering provider during after hours [place], for this patient?  Check the care team in [facility] and look for a) attending/consulting [facility] provider listed and b) the [facility] team listed Log into [email or web address] and use [place]'s universal password to access. If you do not have the password, please contact the hospital  operator. Locate the [facility] provider you are looking for under [facility] and page to a number that you can be directly reached. If you still have difficulty reaching the provider, please page the [facility] (Director on Call) for the Hospitalists listed on amion for assistance.  10/09/2021, 8:47 AM

## 2021-10-10 MED ORDER — MORPHINE SULFATE (CONCENTRATE) 10 MG/0.5ML PO SOLN: 5.0000 mg | ORAL | 0 refills | Status: AC | PRN

## 2021-10-10 MED ORDER — POLYVINYL ALCOHOL 1.4 % OP SOLN
1.0000 [drp] | Freq: Four times a day (QID) | OPHTHALMIC | 0 refills | Status: AC | PRN
Start: 2021-10-10 — End: ?

## 2021-10-10 MED ORDER — ACETAMINOPHEN 650 MG RE SUPP
650.0000 mg | Freq: Four times a day (QID) | RECTAL | 0 refills | Status: AC | PRN
Start: 2021-10-10 — End: ?

## 2021-10-10 MED ORDER — LORAZEPAM 2 MG/ML PO CONC
1.0000 mg | ORAL | 0 refills | Status: AC | PRN
Start: 2021-10-10 — End: ?

## 2021-10-10 NOTE — Discharge Summary (Signed)
Physician Discharge Summary  Kara [name] MMH:[id number] DOB: May 20, 1927 DOA: 10/08/2021  PCP: Fayrene Helper, MD  Admit date: 10/08/2021 Discharge date: 10/10/2021  Admitted From: home Discharge disposition: home with hospice   Recommendations for Outpatient Follow-Up:   Home with hospice   Discharge Diagnosis:   Principal Problem:   Acute respiratory failure with hypoxia ([place]) Active Problems:   GERD (gastroesophageal reflux disease)   Hypothyroidism   CAP (community acquired pneumonia)   Sacral wound   Leukocytosis   Thrombocytosis   Hypokalemia   Hospice care patient    Discharge Condition: terminal  Diet recommendation: comfort  Code status: DNR   History of Present Illness:   Kara [name] is a 87 y.o. female with medical history significant for  cognitive and memory deficits, requiring total care with primary caregiver who is her daughter that lives with her at home, history of hypothyroidism, osteopenia with current compression fractures, GERD,  history of CAD and anxiety disorder who presents to the emergency department due to 1 day onset of fever and 2-day onset of altered mental status.  Patient was unable to provide a history, history was obtained from ED physician, ED medical record and daughter at bedside.  Per report, patient has had decreased oral intake and change in mental status since yesterday, O2 sat at home was 85% on room air and she presents with increased breathing rate, so EMS was activated and patient was sent to the ED for further evaluation and management.  Patient has a sacral wound that has been ongoing for about 2 months and she was on antibiotics for this, the wound was being taken care of by daughter who lives with her.   Hospital Course by Problem:   Acute respiratory failure with hypoxia possibly secondary to sepsis due to CAP POA vs Sacral decubitus ulcer Patient met sepsis criteria with tachypnea, tachycardia,  leukocytosis and chest x-ray suggestive of pneumonia as well as sacral wound Patient was started on IV vancomycin, cefepime and Flagyl -poor overall prognosis- needs to transition to comfort care- poor nutritional status and dysphagia with wounds that will not heal -prevlon boots   Dysphagia -SLP-- suspect needs best if any recommendations for comfort feeds   Thrombocytosis possibly reactive Platelets 495, continue to monitor platelets with morning labs  Hypokalemia/hypophosphatemia -replete   Dehydration Continue IV hydration- although I suspect she will begin to 3rd space   Hypoalbuminemia/severe protein calorie malnutrition -appears patient is dying  Hypothyroidism -resume synthroid when and if safe top swallow -ultimately needs to be comfort care  GERD Continue Protonix   Pressure ulcer on hips/heels/sacrum Pressure Injury 07/04/21 Sacrum Stage 4 - Full thickness tissue loss with exposed bone, tendon or muscle. (Active)  07/04/21 2045  Location: Sacrum  Location Orientation:   Staging: Stage 4 - Full thickness tissue loss with exposed bone, tendon or muscle.  Wound Description (Comments):   Present on Admission: Yes     Pressure Injury 10/08/21 Thigh Anterior;Proximal;Right Deep Tissue Pressure Injury - Purple or maroon localized area of discolored intact skin or blood-filled blister due to damage of underlying soft tissue from pressure and/or shear. (Active)  10/08/21 2315  Location: Thigh  Location Orientation: Anterior;Proximal;Right  Staging: Deep Tissue Pressure Injury - Purple or maroon localized area of discolored intact skin or blood-filled blister due to damage of underlying soft tissue from pressure and/or shear.  Wound Description (Comments):   Present on Admission: Yes     Pressure Injury  10/08/21 Heel Right Deep Tissue Pressure Injury - Purple or maroon localized area of discolored intact skin or blood-filled blister due to damage of underlying soft tissue  from pressure and/or shear. (Active)  10/08/21 2315  Location: Heel  Location Orientation: Right  Staging: Deep Tissue Pressure Injury - Purple or maroon localized area of discolored intact skin or blood-filled blister due to damage of underlying soft tissue from pressure and/or shear.  Wound Description (Comments):   Present on Admission: Yes     Pressure Injury 10/08/21 Heel Left Deep Tissue Pressure Injury - Purple or maroon localized area of discolored intact skin or blood-filled blister due to damage of underlying soft tissue from pressure and/or shear. (Active)  10/08/21 2315  Location: Heel  Location Orientation: Left  Staging: Deep Tissue Pressure Injury - Purple or maroon localized area of discolored intact skin or blood-filled blister due to damage of underlying soft tissue from pressure and/or shear.  Wound Description (Comments):   Present on Admission: Yes     Pressure Injury 10/08/21 Back Left;Upper Deep Tissue Pressure Injury - Purple or maroon localized area of discolored intact skin or blood-filled blister due to damage of underlying soft tissue from pressure and/or shear. (Active)  10/08/21 2315  Location: Back  Location Orientation: Left;Upper  Staging: Deep Tissue Pressure Injury - Purple or maroon localized area of discolored intact skin or blood-filled blister due to damage of underlying soft tissue from pressure and/or shear.  Wound Description (Comments):   Present on Admission: Yes     Pressure Injury 10/08/21 Thigh Anterior;Left;Medial Deep Tissue Pressure Injury - Purple or maroon localized area of discolored intact skin or blood-filled blister due to damage of underlying soft tissue from pressure and/or shear. (Active)  10/08/21 2315  Location: Thigh  Location Orientation: Anterior;Left;Medial  Staging: Deep Tissue Pressure Injury - Purple or maroon localized area of discolored intact skin or blood-filled blister due to damage of underlying soft tissue from  pressure and/or shear.  Wound Description (Comments):   Present on Admission: Yes      Medical Consultants:    Palliative care  Discharge Exam:   Vitals:   10/09/21 1100 10/10/21 0507  BP: (!) 104/31 (!) 130/59  Pulse: 100 92  Resp: 18   Temp: (!) 96.7 F (35.9 C) 98 F (36.7 C)  SpO2: 94% 100%   Vitals:   10/09/21 0317 10/09/21 0516 10/09/21 1100 10/10/21 0507  BP: (!) 111/21 (!) 106/28 (!) 104/31 (!) 130/59  Pulse: (!) 115 (!) 107 100 92  Resp:   18   Temp: 98.1 F (36.7 C) 97.8 F (36.6 C) (!) 96.7 F (35.9 C) 98 F (36.7 C)  TempSrc: Oral Oral Axillary Oral  SpO2: 94% 96% 94% 100%  Weight:      Height:        General exam: Appears calm and comfortable.    The results of significant diagnostics from this hospitalization (including imaging, microbiology, ancillary and laboratory) are listed below for reference.     Procedures and Diagnostic Studies:   DG Chest Port 1 View  Result Date: 10/08/2021 CLINICAL DATA:  Fever. EXAM: PORTABLE CHEST 1 VIEW COMPARISON:  Chest x-ray 07/04/2021 FINDINGS: There are retrocardiac opacities and small left pleural effusion. Emphysematous changes are again noted in both lungs. There is no pneumothorax. There are atherosclerotic calcifications of the aorta. The cardiac silhouette is within normal limits. No acute fractures are seen. IMPRESSION: 1. Retrocardiac atelectasis/airspace disease worrisome for pneumonia or aspiration. 2. Small left pleural  effusion. 3. Followup PA and lateral chest X-ray is recommended in 3-4 weeks following trial of antibiotic therapy to ensure resolution and exclude underlying malignancy. Electronically Signed   By: Ronney Asters M.D.   On: 10/08/2021 18:21     Labs:   Basic Metabolic Panel: Recent Labs  Lab 10/08/21 1800 10/09/21 0539  NA 142 140  K 3.4* 3.7  CL 106 107  CO2 26 23  GLUCOSE 129* 119*  BUN 28* 24*  CREATININE 0.60 0.42*  CALCIUM 8.3* 8.0*  MG  --  1.9  PHOS  --  1.9*    GFR Estimated Creatinine Clearance: 31.9 mL/min (A) (by C-G formula based on SCr of 0.42 mg/dL (L)). Liver Function Tests: Recent Labs  Lab 10/08/21 1800 10/09/21 0539  AST 23 22  ALT 14 12  ALKPHOS 79 68  BILITOT 0.7 0.6  PROT 6.3* 5.5*  ALBUMIN 1.9* 1.6*   No results for input(s): LIPASE, AMYLASE in the last 168 hours. No results for input(s): AMMONIA in the last 168 hours. Coagulation profile Recent Labs  Lab 10/08/21 1800  INR 1.5*    CBC: Recent Labs  Lab 10/08/21 1800 10/09/21 0539  WBC 22.6* 23.0*  NEUTROABS 19.9*  --   HGB 8.4* 7.8*  HCT 27.5* 25.6*  MCV 105.8* 104.1*  PLT 495* 453*   Cardiac Enzymes: No results for input(s): CKTOTAL, CKMB, CKMBINDEX, TROPONINI in the last 168 hours. BNP: Invalid input(s): POCBNP CBG: No results for input(s): GLUCAP in the last 168 hours. D-Dimer No results for input(s): DDIMER in the last 72 hours. Hgb A1c No results for input(s): HGBA1C in the last 72 hours. Lipid Profile No results for input(s): CHOL, HDL, LDLCALC, TRIG, CHOLHDL, LDLDIRECT in the last 72 hours. Thyroid function studies No results for input(s): TSH, T4TOTAL, T3FREE, THYROIDAB in the last 72 hours.  Invalid input(s): FREET3 Anemia work up No results for input(s): VITAMINB12, FOLATE, FERRITIN, TIBC, IRON, RETICCTPCT in the last 72 hours. Microbiology Recent Results (from the past 240 hour(s))  Culture, blood (Routine x 2)     Status: None (Preliminary result)   Collection Time: 10/08/21  6:00 PM   Specimen: BLOOD LEFT FOREARM  Result Value Ref Range Status   Specimen Description   Final    BLOOD LEFT FOREARM BOTTLES DRAWN AEROBIC AND ANAEROBIC   Special Requests Blood Culture adequate volume  Final   Culture   Final    NO GROWTH < 12 HOURS Performed at [facility], [address], [place], [place] [address]    Report Status PENDING  Incomplete  Culture, blood (Routine x 2)     Status: None (Preliminary result)   Collection Time: 10/08/21   6:00 PM   Specimen: BLOOD RIGHT FOREARM  Result Value Ref Range Status   Specimen Description   Final    BLOOD RIGHT FOREARM BOTTLES DRAWN AEROBIC AND ANAEROBIC   Special Requests Blood Culture adequate volume  Final   Culture   Final    NO GROWTH < 12 HOURS Performed at [facility], [address], [place], [place] [address]    Report Status PENDING  Incomplete  Resp Panel by RT-PCR (Flu A&B, Covid) Nasopharyngeal Swab     Status: None   Collection Time: 10/08/21  6:00 PM   Specimen: Nasopharyngeal Swab; Nasopharyngeal(NP) swabs in vial transport medium  Result Value Ref Range Status   SARS Coronavirus 2 by RT PCR NEGATIVE NEGATIVE Final    Comment: (NOTE) SARS-CoV-2 target nucleic acids are NOT  DETECTED.  The SARS-CoV-2 RNA is generally detectable in upper respiratory specimens during the acute phase of infection. The lowest concentration of SARS-CoV-2 viral copies this assay can detect is 138 copies/mL. A negative result does not preclude SARS-Cov-2 infection and should not be used as the sole basis for treatment or other patient management decisions. A negative result may occur with  improper specimen collection/handling, submission of specimen other than nasopharyngeal swab, presence of viral mutation(s) within the areas targeted by this assay, and inadequate number of viral copies(<138 copies/mL). A negative result must be combined with clinical observations, patient history, and epidemiological information. The expected result is Negative.  Fact Sheet for Patients:  EntrepreneurPulse.com.au  Fact Sheet for Healthcare Providers:  IncredibleEmployment.be  This test is no t yet approved or cleared by the [place] FDA and  has been authorized for detection and/or diagnosis of SARS-CoV-2 by FDA under an Emergency Use Authorization (EUA). This EUA will remain  in effect (meaning this test can be used) for the duration of the COVID-19  declaration under Section 564(b)(1) of the Act, 21 U.S.C.section 360bbb-3(b)(1), unless the authorization is terminated  or revoked sooner.       Influenza A by PCR NEGATIVE NEGATIVE Final   Influenza B by PCR NEGATIVE NEGATIVE Final    Comment: (NOTE) The Xpert Xpress SARS-CoV-2/FLU/RSV plus assay is intended as an aid in the diagnosis of influenza from Nasopharyngeal swab specimens and should not be used as a sole basis for treatment. Nasal washings and aspirates are unacceptable for Xpert Xpress SARS-CoV-2/FLU/RSV testing.  Fact Sheet for Patients: EntrepreneurPulse.com.au  Fact Sheet for Healthcare Providers: IncredibleEmployment.be  This test is not yet approved or cleared by the [place] FDA and has been authorized for detection and/or diagnosis of SARS-CoV-2 by FDA under an Emergency Use Authorization (EUA). This EUA will remain in effect (meaning this test can be used) for the duration of the COVID-19 declaration under Section 564(b)(1) of the Act, 21 U.S.C. section 360bbb-3(b)(1), unless the authorization is terminated or revoked.  Performed at [facility], [address] [place], [place] [address]      Discharge Instructions:   Discharge Instructions     Discharge wound care:   Complete by: As directed    Wound care to sacral pressure injury, stage 4:  Cleanse with NS.  Fill defect and any areas of undermining with NS moistened gauze (opened) to skin level.  Top with dry gauze, secure with silicone foam dressing. Change twice daily and PRN drainage strike-through onto exterior of dressing or soiling. Wound care to bilateral heels (DTPI):  Cleanse with NS, pat dry. Cover with xeroform gauze, top with dry gauze and secure with Kerlix roll gauze/paper tape. Place feet into [employer]. Wound care to right proximal thigh:  Cleanse area of erythema and pat dry. Cover with silicone foam. Change every 2-3 days and PRN dressing  dislodgement   Increase activity slowly   Complete by: As directed    No wound care   Complete by: As directed       Allergies as of 10/10/2021       Reactions   Amoxicillin-pot Clavulanate Hives, Itching   Has patient had a PCN reaction causing immediate rash, facial/tongue/throat swelling, SOB or lightheadedness with hypotension: No Has patient had a PCN reaction causing severe rash involving mucus membranes or skin necrosis: No Has patient had a PCN reaction that required hospitalization: No Has patient had a PCN reaction occurring within the last 10 years:  Yes If all of the above answers are "NO", then may proceed with Cephalosporin use.   Tramadol Other (See Comments)   HALLUCINATIONS    Other Other (See Comments)   ALLERGY TO GI COCKTAIL UNSPECIFIED REACTION    Carafate [sucralfate] Other (See Comments)   Tingly sensation arms, neck, scalp.    Cetirizine Hcl Other (See Comments)   REACTION: too strong for her UNSPECIFIED REACTIONS   Mometasone Furoate Dermatitis   NASONEX        Medication List     STOP taking these medications    Benefiber Powd   CALCIUM 600+D3 PO   cephALEXin 500 MG capsule Commonly known as: KEFLEX   citric acid-potassium citrate 1100-334 MG/5ML solution Commonly known as: POLYCITRA   clonazePAM 0.5 MG tablet Commonly known as: KLONOPIN   gabapentin 100 MG capsule Commonly known as: NEURONTIN   levothyroxine 25 MCG tablet Commonly known as: SYNTHROID   meloxicam 7.5 MG tablet Commonly known as: MOBIC   omeprazole 20 MG capsule Commonly known as: PRILOSEC   ondansetron 8 MG disintegrating tablet Commonly known as: Zofran ODT   oxyCODONE-acetaminophen 10-325 MG tablet Commonly known as: PERCOCET   Polyethyl Glycol-Propyl Glycol 0.4-0.3 % Soln   polyethylene glycol 17 g packet Commonly known as: MiraLax   UNABLE TO FIND   UNABLE TO FIND   UNABLE TO FIND   Vitamin D (Ergocalciferol) 1.25 MG (50000 UNIT) Caps  capsule Commonly known as: DRISDOL       TAKE these medications    acetaminophen 650 MG suppository Commonly known as: TYLENOL Place 1 suppository (650 mg total) rectally every 6 (six) hours as needed for mild pain (or Fever >/= 101).   LORazepam 2 MG/ML concentrated solution Commonly known as: ATIVAN Place 0.5 mLs (1 mg total) under the tongue every 4 (four) hours as needed for anxiety.   morphine CONCENTRATE 10 MG/0.5ML Soln concentrated solution Place 0.25 mLs (5 mg total) under the tongue every 2 (two) hours as needed for moderate pain (or dyspnea).   polyvinyl alcohol 1.4 % ophthalmic solution Commonly known as: LIQUIFILM TEARS Place 1 drop into both eyes 4 (four) times daily as needed for dry eyes.               Discharge Care Instructions  (From admission, onward)           Start     Ordered   10/10/21 0000  Discharge wound care:       Comments: Wound care to sacral pressure injury, stage 4:  Cleanse with NS.  Fill defect and any areas of undermining with NS moistened gauze (opened) to skin level.  Top with dry gauze, secure with silicone foam dressing. Change twice daily and PRN drainage strike-through onto exterior of dressing or soiling. Wound care to bilateral heels (DTPI):  Cleanse with NS, pat dry. Cover with xeroform gauze, top with dry gauze and secure with Kerlix roll gauze/paper tape. Place feet into [employer]. Wound care to right proximal thigh:  Cleanse area of erythema and pat dry. Cover with silicone foam. Change every 2-3 days and PRN dressing dislodgement   10/10/21 0944              Time coordinating discharge: 35 min  Signed:  Geradine Girt DO  [facility] 10/10/2021, 11:29 AM

## 2021-10-10 NOTE — Plan of Care (Signed)

## 2021-10-10 NOTE — TOC Transition Note (Signed)
Transition of Care [facility]) - CM/SW Discharge Note   Patient Details  Name: Kara [name] MRN: [id number] Date of Birth: 11-29-1926  Transition of Care [facility]) CM/SW Contact:  Salome Arnt, LCSW Phone Number: 10/10/2021, 12:29 PM   Clinical Narrative:  Pt d/c today home with [facility]. Pt's daughter, [name] confirmed address and transport set up with [facility]. RN updated. DME delivered per family. Family will call hospice upon arrival home.      Final next level of care: Home w Hospice Care Barriers to Discharge: Barriers Resolved   Patient Goals and CMS Choice Patient states their goals for this hospitalization and ongoing recovery are:: return home with hospice   Choice offered to / list presented to : Adult Children  Discharge Placement                Patient to be transferred to facility by: [facility] Name of family member notified: [name]- daughter Patient and family notified of of transfer: 10/10/21  Discharge Plan and Services In-house Referral: Clinical Social Work, Hospice / [place] Acute Care Choice: Durable Medical Equipment, Hospice          DME Arranged: Hospital bed, Oxygen DME Agency: [place] Date [place]: 10/09/21 Time [place]: 66 Representative spoke with at [place]: [place] ([place]) Interventions     Readmission Risk Interventions No flowsheet data found.

## 2021-10-13 LAB — CULTURE, BLOOD (ROUTINE X 2)
Culture: NO GROWTH
Culture: NO GROWTH
Special Requests: ADEQUATE
Special Requests: ADEQUATE

## 2021-10-22 DEATH — deceased

## 2021-11-16 ENCOUNTER — Ambulatory Visit: Payer: Medicare Other | Admitting: Family Medicine
